# Patient Record
Sex: Male | Born: 1995 | Race: Black or African American | Hispanic: No | Marital: Single | Smoking: Never smoker
Health system: Southern US, Community
[De-identification: ages and names within clinical notes are randomized; demographics above are authoritative.]

## PROBLEM LIST (undated history)

## (undated) DIAGNOSIS — E119 Type 2 diabetes mellitus without complications: Secondary | ICD-10-CM

## (undated) DIAGNOSIS — Q249 Congenital malformation of heart, unspecified: Secondary | ICD-10-CM

## (undated) DIAGNOSIS — I10 Essential (primary) hypertension: Secondary | ICD-10-CM

## (undated) DIAGNOSIS — R569 Unspecified convulsions: Secondary | ICD-10-CM

## (undated) HISTORY — PX: OTHER SURGICAL HISTORY: SHX169

---

## 2017-04-24 DIAGNOSIS — Z113 Encounter for screening for infections with a predominantly sexual mode of transmission: Secondary | ICD-10-CM | POA: Diagnosis not present

## 2017-06-23 DIAGNOSIS — Z113 Encounter for screening for infections with a predominantly sexual mode of transmission: Secondary | ICD-10-CM | POA: Diagnosis not present

## 2018-01-02 ENCOUNTER — Emergency Department (HOSPITAL_COMMUNITY): Payer: BLUE CROSS/BLUE SHIELD

## 2018-01-02 ENCOUNTER — Inpatient Hospital Stay (HOSPITAL_COMMUNITY)
Admission: EM | Admit: 2018-01-02 | Discharge: 2018-01-09 | DRG: 064 | Disposition: A | Discharge status: Home Health | Payer: BLUE CROSS/BLUE SHIELD | Attending: Internal Medicine | Admitting: Internal Medicine

## 2018-01-02 ENCOUNTER — Other Ambulatory Visit: Payer: Self-pay

## 2018-01-02 ENCOUNTER — Encounter (HOSPITAL_COMMUNITY): Payer: Self-pay | Admitting: Emergency Medicine

## 2018-01-02 DIAGNOSIS — D72829 Elevated white blood cell count, unspecified: Secondary | ICD-10-CM

## 2018-01-02 DIAGNOSIS — I38 Endocarditis, valve unspecified: Secondary | ICD-10-CM

## 2018-01-02 DIAGNOSIS — E785 Hyperlipidemia, unspecified: Secondary | ICD-10-CM | POA: Diagnosis present

## 2018-01-02 DIAGNOSIS — R569 Unspecified convulsions: Secondary | ICD-10-CM | POA: Diagnosis not present

## 2018-01-02 DIAGNOSIS — I639 Cerebral infarction, unspecified: Secondary | ICD-10-CM | POA: Diagnosis present

## 2018-01-02 DIAGNOSIS — R29700 NIHSS score 0: Secondary | ICD-10-CM | POA: Diagnosis present

## 2018-01-02 DIAGNOSIS — I10 Essential (primary) hypertension: Secondary | ICD-10-CM | POA: Diagnosis present

## 2018-01-02 DIAGNOSIS — I6340 Cerebral infarction due to embolism of unspecified cerebral artery: Principal | ICD-10-CM | POA: Diagnosis present

## 2018-01-02 DIAGNOSIS — I76 Septic arterial embolism: Secondary | ICD-10-CM | POA: Diagnosis present

## 2018-01-02 DIAGNOSIS — I339 Acute and subacute endocarditis, unspecified: Secondary | ICD-10-CM | POA: Diagnosis present

## 2018-01-02 DIAGNOSIS — G40909 Epilepsy, unspecified, not intractable, without status epilepticus: Secondary | ICD-10-CM

## 2018-01-02 DIAGNOSIS — F1210 Cannabis abuse, uncomplicated: Secondary | ICD-10-CM | POA: Diagnosis present

## 2018-01-02 DIAGNOSIS — Q211 Atrial septal defect: Secondary | ICD-10-CM

## 2018-01-02 DIAGNOSIS — I513 Intracardiac thrombosis, not elsewhere classified: Secondary | ICD-10-CM | POA: Diagnosis present

## 2018-01-02 DIAGNOSIS — I240 Acute coronary thrombosis not resulting in myocardial infarction: Secondary | ICD-10-CM

## 2018-01-02 DIAGNOSIS — E10649 Type 1 diabetes mellitus with hypoglycemia without coma: Secondary | ICD-10-CM | POA: Diagnosis not present

## 2018-01-02 DIAGNOSIS — E1065 Type 1 diabetes mellitus with hyperglycemia: Secondary | ICD-10-CM | POA: Diagnosis present

## 2018-01-02 DIAGNOSIS — Q225 Ebstein's anomaly: Secondary | ICD-10-CM

## 2018-01-02 DIAGNOSIS — M3211 Endocarditis in systemic lupus erythematosus: Secondary | ICD-10-CM

## 2018-01-02 HISTORY — DX: Type 2 diabetes mellitus without complications: E11.9

## 2018-01-02 HISTORY — DX: Congenital malformation of heart, unspecified: Q24.9

## 2018-01-02 HISTORY — DX: Unspecified convulsions: R56.9

## 2018-01-02 LAB — BASIC METABOLIC PANEL
Anion gap: 13 (ref 5–15)
BUN: 10 mg/dL (ref 6–20)
CALCIUM: 9.4 mg/dL (ref 8.9–10.3)
CO2: 24 mmol/L (ref 22–32)
CREATININE: 1.16 mg/dL (ref 0.61–1.24)
Chloride: 102 mmol/L (ref 101–111)
GFR calc Af Amer: >60 mL/min (ref >60)
GFR calc non Af Amer: >60 mL/min (ref >60)
Glucose, Bld: 255 mg/dL — ABNORMAL HIGH (ref 65–99)
Potassium: 3.9 mmol/L (ref 3.5–5.1)
Sodium: 139 mmol/L (ref 135–145)

## 2018-01-02 LAB — CBC
HCT: 48.4 % (ref 39.0–52.0)
Hemoglobin: 17.3 g/dL — ABNORMAL HIGH (ref 13.0–17.0)
MCH: 29 pg (ref 26.0–34.0)
MCHC: 35.7 g/dL (ref 30.0–36.0)
MCV: 81.2 fL (ref 78.0–100.0)
PLATELETS: 265 10*3/uL (ref 150–400)
RBC: 5.96 MIL/uL — ABNORMAL HIGH (ref 4.22–5.81)
RDW: 13.4 % (ref 11.5–15.5)
WBC: 13.1 10*3/uL — ABNORMAL HIGH (ref 4.0–10.5)

## 2018-01-02 LAB — CBG MONITORING, ED: Glucose-Capillary: 215 mg/dL — ABNORMAL HIGH (ref 65–99)

## 2018-01-02 NOTE — ED Triage Notes (Signed)
Pt to ER for evaluation of "possible seizures" states woke up this morning with "extreme body pain, bite marks on my tongue, nausea, and a headache." states no known hx of seizures, not on seizure medications. Pt alert and oriented x4. NAD

## 2018-01-03 ENCOUNTER — Emergency Department (HOSPITAL_COMMUNITY): Payer: BLUE CROSS/BLUE SHIELD

## 2018-01-03 ENCOUNTER — Observation Stay (HOSPITAL_COMMUNITY): Payer: BLUE CROSS/BLUE SHIELD

## 2018-01-03 ENCOUNTER — Other Ambulatory Visit: Payer: Self-pay

## 2018-01-03 ENCOUNTER — Encounter (HOSPITAL_COMMUNITY): Payer: Self-pay | Admitting: Internal Medicine

## 2018-01-03 DIAGNOSIS — D72829 Elevated white blood cell count, unspecified: Secondary | ICD-10-CM | POA: Diagnosis present

## 2018-01-03 DIAGNOSIS — I6340 Cerebral infarction due to embolism of unspecified cerebral artery: Secondary | ICD-10-CM | POA: Diagnosis present

## 2018-01-03 DIAGNOSIS — Q225 Ebstein's anomaly: Secondary | ICD-10-CM

## 2018-01-03 DIAGNOSIS — E1065 Type 1 diabetes mellitus with hyperglycemia: Secondary | ICD-10-CM | POA: Diagnosis present

## 2018-01-03 DIAGNOSIS — E109 Type 1 diabetes mellitus without complications: Secondary | ICD-10-CM | POA: Diagnosis not present

## 2018-01-03 DIAGNOSIS — Z8774 Personal history of (corrected) congenital malformations of heart and circulatory system: Secondary | ICD-10-CM | POA: Diagnosis not present

## 2018-01-03 DIAGNOSIS — R569 Unspecified convulsions: Secondary | ICD-10-CM | POA: Diagnosis present

## 2018-01-03 DIAGNOSIS — E10649 Type 1 diabetes mellitus with hypoglycemia without coma: Secondary | ICD-10-CM | POA: Diagnosis not present

## 2018-01-03 DIAGNOSIS — I5189 Other ill-defined heart diseases: Secondary | ICD-10-CM | POA: Diagnosis not present

## 2018-01-03 DIAGNOSIS — E785 Hyperlipidemia, unspecified: Secondary | ICD-10-CM | POA: Diagnosis present

## 2018-01-03 DIAGNOSIS — I76 Septic arterial embolism: Secondary | ICD-10-CM | POA: Diagnosis present

## 2018-01-03 DIAGNOSIS — Z8669 Personal history of other diseases of the nervous system and sense organs: Secondary | ICD-10-CM | POA: Diagnosis not present

## 2018-01-03 DIAGNOSIS — R29700 NIHSS score 0: Secondary | ICD-10-CM | POA: Diagnosis present

## 2018-01-03 DIAGNOSIS — I6389 Other cerebral infarction: Secondary | ICD-10-CM | POA: Diagnosis not present

## 2018-01-03 DIAGNOSIS — I639 Cerebral infarction, unspecified: Secondary | ICD-10-CM | POA: Diagnosis present

## 2018-01-03 DIAGNOSIS — I236 Thrombosis of atrium, auricular appendage, and ventricle as current complications following acute myocardial infarction: Secondary | ICD-10-CM | POA: Diagnosis not present

## 2018-01-03 DIAGNOSIS — I361 Nonrheumatic tricuspid (valve) insufficiency: Secondary | ICD-10-CM | POA: Diagnosis not present

## 2018-01-03 DIAGNOSIS — G40909 Epilepsy, unspecified, not intractable, without status epilepticus: Secondary | ICD-10-CM

## 2018-01-03 DIAGNOSIS — I10 Essential (primary) hypertension: Secondary | ICD-10-CM | POA: Diagnosis present

## 2018-01-03 DIAGNOSIS — I339 Acute and subacute endocarditis, unspecified: Secondary | ICD-10-CM | POA: Diagnosis present

## 2018-01-03 DIAGNOSIS — E7211 Homocystinuria: Secondary | ICD-10-CM | POA: Diagnosis not present

## 2018-01-03 DIAGNOSIS — Z952 Presence of prosthetic heart valve: Secondary | ICD-10-CM | POA: Diagnosis not present

## 2018-01-03 DIAGNOSIS — I513 Intracardiac thrombosis, not elsewhere classified: Secondary | ICD-10-CM | POA: Diagnosis present

## 2018-01-03 DIAGNOSIS — Q211 Atrial septal defect: Secondary | ICD-10-CM | POA: Diagnosis not present

## 2018-01-03 DIAGNOSIS — F1210 Cannabis abuse, uncomplicated: Secondary | ICD-10-CM | POA: Diagnosis present

## 2018-01-03 DIAGNOSIS — I330 Acute and subacute infective endocarditis: Secondary | ICD-10-CM | POA: Diagnosis not present

## 2018-01-03 LAB — RAPID URINE DRUG SCREEN, HOSP PERFORMED
Amphetamines: NOT DETECTED
Barbiturates: NOT DETECTED
Benzodiazepines: NOT DETECTED
Cocaine: NOT DETECTED
OPIATES: NOT DETECTED
Tetrahydrocannabinol: NOT DETECTED

## 2018-01-03 LAB — COMPREHENSIVE METABOLIC PANEL
ALT: 39 U/L (ref 17–63)
AST: 72 U/L — AB (ref 15–41)
Albumin: 3.1 g/dL — ABNORMAL LOW (ref 3.5–5.0)
Alkaline Phosphatase: 88 U/L (ref 38–126)
Anion gap: 12 (ref 5–15)
BILIRUBIN TOTAL: 1.2 mg/dL (ref 0.3–1.2)
BUN: 11 mg/dL (ref 6–20)
CO2: 17 mmol/L — ABNORMAL LOW (ref 22–32)
Calcium: 8.4 mg/dL — ABNORMAL LOW (ref 8.9–10.3)
Chloride: 101 mmol/L (ref 101–111)
Creatinine, Ser: 1.3 mg/dL — ABNORMAL HIGH (ref 0.61–1.24)
Glucose, Bld: 522 mg/dL (ref 65–99)
POTASSIUM: 4.3 mmol/L (ref 3.5–5.1)
Sodium: 130 mmol/L — ABNORMAL LOW (ref 135–145)
TOTAL PROTEIN: 5.5 g/dL — AB (ref 6.5–8.1)

## 2018-01-03 LAB — SJOGRENS SYNDROME-A EXTRACTABLE NUCLEAR ANTIBODY

## 2018-01-03 LAB — GLUCOSE, CAPILLARY
GLUCOSE-CAPILLARY: 184 mg/dL — AB (ref 65–99)
Glucose-Capillary: 233 mg/dL — ABNORMAL HIGH (ref 65–99)

## 2018-01-03 LAB — C-REACTIVE PROTEIN: CRP: 1.4 mg/dL — AB (ref <1.0)

## 2018-01-03 LAB — HEMOGLOBIN A1C
HEMOGLOBIN A1C: 12.2 % — AB (ref 4.8–5.6)
MEAN PLASMA GLUCOSE: 303.44 mg/dL

## 2018-01-03 LAB — CBC
HEMATOCRIT: 41.9 % (ref 39.0–52.0)
Hemoglobin: 14.4 g/dL (ref 13.0–17.0)
MCH: 27.6 pg (ref 26.0–34.0)
MCHC: 34.4 g/dL (ref 30.0–36.0)
MCV: 80.4 fL (ref 78.0–100.0)
Platelets: 269 10*3/uL (ref 150–400)
RBC: 5.21 MIL/uL (ref 4.22–5.81)
RDW: 13.4 % (ref 11.5–15.5)
WBC: 8 10*3/uL (ref 4.0–10.5)

## 2018-01-03 LAB — LIPID PANEL
CHOLESTEROL: 137 mg/dL (ref 0–200)
HDL: 49 mg/dL (ref >40)
LDL Cholesterol: 69 mg/dL (ref 0–99)
TRIGLYCERIDES: 97 mg/dL (ref <150)
Total CHOL/HDL Ratio: 2.8 RATIO
VLDL: 19 mg/dL (ref 0–40)

## 2018-01-03 LAB — SJOGRENS SYNDROME-B EXTRACTABLE NUCLEAR ANTIBODY

## 2018-01-03 LAB — ANTITHROMBIN III: ANTITHROMB III FUNC: 112 % (ref 75–120)

## 2018-01-03 LAB — CBG MONITORING, ED
GLUCOSE-CAPILLARY: 497 mg/dL — AB (ref 65–99)
GLUCOSE-CAPILLARY: 147 mg/dL — AB (ref 65–99)
Glucose-Capillary: 116 mg/dL — ABNORMAL HIGH (ref 65–99)

## 2018-01-03 LAB — SEDIMENTATION RATE: SED RATE: 1 mm/h (ref 0–16)

## 2018-01-03 LAB — HIV ANTIBODY (ROUTINE TESTING W REFLEX): HIV Screen 4th Generation wRfx: NONREACTIVE

## 2018-01-03 LAB — URINALYSIS, ROUTINE W REFLEX MICROSCOPIC
BACTERIA UA: NONE SEEN
BILIRUBIN URINE: NEGATIVE
Glucose, UA: >=500 mg/dL — AB
HGB URINE DIPSTICK: NEGATIVE
KETONES UR: NEGATIVE mg/dL
LEUKOCYTES UA: NEGATIVE
Nitrite: NEGATIVE
PH: 7 (ref 5.0–8.0)
Protein, ur: NEGATIVE mg/dL
Specific Gravity, Urine: 1.021 (ref 1.005–1.030)
Squamous Epithelial / LPF: NONE SEEN

## 2018-01-03 MED ORDER — ACETAMINOPHEN 650 MG RE SUPP: 650.0000 mg | RECTAL | Status: DC | PRN

## 2018-01-03 MED ORDER — ATORVASTATIN CALCIUM 80 MG PO TABS
80.0000 mg | ORAL_TABLET | Freq: Every day | ORAL | Status: DC
  Administered 2018-01-03 – 2018-01-04 (×2): 80 mg via ORAL
  Filled 2018-01-03 (×2): qty 1

## 2018-01-03 MED ORDER — SODIUM CHLORIDE 0.9 % IV SOLN
INTRAVENOUS | Status: AC
  Administered 2018-01-03 – 2018-01-04 (×2): via INTRAVENOUS

## 2018-01-03 MED ORDER — INSULIN GLARGINE 100 UNIT/ML ~~LOC~~ SOLN
30.0000 [IU] | Freq: Once | SUBCUTANEOUS | Status: AC
  Administered 2018-01-03: 30 [IU] via SUBCUTANEOUS
  Filled 2018-01-03: qty 0.3

## 2018-01-03 MED ORDER — INSULIN GLARGINE 100 UNIT/ML ~~LOC~~ SOLN
30.0000 [IU] | Freq: Every day | SUBCUTANEOUS | Status: DC
  Administered 2018-01-03 – 2018-01-08 (×6): 30 [IU] via SUBCUTANEOUS
  Filled 2018-01-03 (×7): qty 0.3

## 2018-01-03 MED ORDER — ACETAMINOPHEN 325 MG PO TABS: 650.0000 mg | ORAL_TABLET | ORAL | Status: DC | PRN

## 2018-01-03 MED ORDER — SODIUM CHLORIDE 0.9 % IV SOLN
1000.0000 mg | Freq: Once | INTRAVENOUS | Status: AC
  Administered 2018-01-03: 1000 mg via INTRAVENOUS
  Filled 2018-01-03: qty 10

## 2018-01-03 MED ORDER — ASPIRIN 300 MG RE SUPP: 300.0000 mg | Freq: Every day | RECTAL | Status: DC

## 2018-01-03 MED ORDER — IOPAMIDOL (ISOVUE-370) INJECTION 76%
INTRAVENOUS | Status: AC
  Administered 2018-01-03: 50 mL
  Filled 2018-01-03: qty 50

## 2018-01-03 MED ORDER — STROKE: EARLY STAGES OF RECOVERY BOOK
Freq: Once | Status: AC
  Administered 2018-01-05: 22:00:00
  Filled 2018-01-03: qty 1

## 2018-01-03 MED ORDER — LEVETIRACETAM 500 MG PO TABS
500.0000 mg | ORAL_TABLET | Freq: Two times a day (BID) | ORAL | Status: DC
  Administered 2018-01-03 – 2018-01-09 (×13): 500 mg via ORAL
  Filled 2018-01-03 (×13): qty 1

## 2018-01-03 MED ORDER — GADOBENATE DIMEGLUMINE 529 MG/ML IV SOLN
11.0000 mL | Freq: Once | INTRAVENOUS | Status: AC | PRN
  Administered 2018-01-03: 11 mL via INTRAVENOUS

## 2018-01-03 MED ORDER — ACETAMINOPHEN 160 MG/5ML PO SOLN: 650.0000 mg | ORAL | Status: DC | PRN

## 2018-01-03 MED ORDER — INSULIN ASPART 100 UNIT/ML ~~LOC~~ SOLN
0.0000 [IU] | Freq: Three times a day (TID) | SUBCUTANEOUS | Status: DC
  Administered 2018-01-03: 2 [IU] via SUBCUTANEOUS
  Administered 2018-01-03: 9 [IU] via SUBCUTANEOUS
  Administered 2018-01-04 (×2): 2 [IU] via SUBCUTANEOUS
  Administered 2018-01-04: 5 [IU] via SUBCUTANEOUS
  Administered 2018-01-05 (×2): 3 [IU] via SUBCUTANEOUS
  Administered 2018-01-05: 5 [IU] via SUBCUTANEOUS
  Administered 2018-01-06: 2 [IU] via SUBCUTANEOUS
  Administered 2018-01-06: 9 [IU] via SUBCUTANEOUS
  Administered 2018-01-07: 7 [IU] via SUBCUTANEOUS
  Administered 2018-01-07: 2 [IU] via SUBCUTANEOUS
  Administered 2018-01-08 – 2018-01-09 (×3): 3 [IU] via SUBCUTANEOUS
  Filled 2018-01-03: qty 1

## 2018-01-03 MED ORDER — ENOXAPARIN SODIUM 40 MG/0.4ML ~~LOC~~ SOLN
40.0000 mg | SUBCUTANEOUS | Status: DC
  Administered 2018-01-04 – 2018-01-05 (×2): 40 mg via SUBCUTANEOUS
  Filled 2018-01-03 (×3): qty 0.4

## 2018-01-03 MED ORDER — KETOROLAC TROMETHAMINE 30 MG/ML IJ SOLN
30.0000 mg | Freq: Once | INTRAMUSCULAR | Status: AC
  Administered 2018-01-03: 30 mg via INTRAVENOUS
  Filled 2018-01-03: qty 1

## 2018-01-03 MED ORDER — ASPIRIN 325 MG PO TABS
325.0000 mg | ORAL_TABLET | Freq: Every day | ORAL | Status: DC
  Administered 2018-01-03 – 2018-01-05 (×3): 325 mg via ORAL
  Filled 2018-01-03 (×3): qty 1

## 2018-01-03 NOTE — Progress Notes (Signed)
STROKE TEAM PROGRESS NOTE   HISTORY OF PRESENT ILLNESS (per record) Parrish Pasqual is a 22 y.o. 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  He said that he woke up early yesterday morning with his body feeling sore all over.  Upon waking up he also found bite marks on his tongue on both sides.  He sought help at the [facility] from where he was referred to the emergency room. He said that he probably had seizures as a child in middle school and was on medication for it up until the end of ninth grade.  He has not had any seizures since then.  He is unable to tell me what antiepileptic he was on at the time. He had been seen by a neurologist in [place] and EEG and MRIs have been done in the past with no results available for review. Currently, he denies any headaches, focal tingling numbness or weakness.  Denies any headaches.  Denies nausea vomiting. Does not report any preceding illnesses, fevers or chills prior to presentation. Denies any chest pain shortness of breath or palpitations. He denies any illicit drug use other than using marijuana.  Does not use tobacco or alcohol.  Denies any family history of strokes in young.  Denies family history of seizures. He does not know of any family member with a history of sickle cell.   LKW: 8 AM on 01/02/2018 tpa given?: no, outside the window Premorbid modified Rankin scale (mRS): 0     SUBJECTIVE (INTERVAL HISTORY) No family present. Pt very sleepy  - lethargic. He agrees to undergo further testing. 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 No recent cardiovascular complaints Denies recent surgery, fever, dental problems.    OBJECTIVE Temp:  [98 F (36.7 C)-99 F (37.2 C)]  98.7 F (37.1 C) (02/09 1522) Pulse Rate:  [70-92] 85 (02/09 1522) Resp:  [13-26] 18 (02/09 1522) BP: (104-140)/(62-87) 110/70 (02/09 1522) SpO2:  [93 %-99 %] 99 % (02/09 1522) Weight:  [123 lb (55.8 kg)] 123 lb (55.8 kg) (02/09 0443)  CBC:  Recent Labs  Lab 01/02/18 1828 01/03/18 0621  WBC 13.1* 8.0  HGB 17.3* 14.4  HCT 48.4 41.9  MCV 81.2 80.4  PLT 265 269    Basic Metabolic Panel:  Recent Labs  Lab 01/02/18 1828 01/03/18 0621  NA 139 130*  K 3.9 4.3  CL 102 101  CO2 24 17*  GLUCOSE 255* 522*  BUN 10 11  CREATININE 1.16 1.30*  CALCIUM 9.4 8.4*    Lipid Panel:     Component Value Date/Time   CHOL 137 01/03/2018 0623   TRIG 97 01/03/2018 0623   HDL 49 01/03/2018 0623   CHOLHDL 2.8 01/03/2018 0623   VLDL 19 01/03/2018 0623   LDLCALC 69 01/03/2018 0623   HgbA1c:  Lab Results  Component Value Date   HGBA1C 12.2 (H) 01/03/2018   Urine Drug Screen:     Component Value Date/Time   LABOPIA NONE DETECTED 01/03/2018 0116   COCAINSCRNUR NONE DETECTED 01/03/2018 0116   LABBENZ NONE DETECTED 01/03/2018 0116   AMPHETMU NONE DETECTED 01/03/2018 0116   THCU NONE DETECTED 01/03/2018 0116   LABBARB NONE DETECTED 01/03/2018 0116    Alcohol Level No results found for:  ETH  IMAGING   Ct Angio Head W Or Wo Contrast Ct Angio Neck W Or Wo Contrast 01/03/2018 IMPRESSION:  1. Negative CTA of the head and neck. No occlusion, stenosis, or embolic source identified.  2. Partially visualized postoperative heart. History of congenital heart disease.    Dg Chest 1 View 01/03/2018 IMPRESSION:  Cardiomegaly.  No active disease.      Ct Head Wo Contrast 01/02/2018 IMPRESSION:  1. 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 There is no definite communication with the lateral ventricle. MRI of the head without and with contrast could be used for further evaluation.  2. No acute intracranial abnormality or focal abnormality to  explain seizures otherwise.     Mr Laqueta JeanBrain W And Wo Contrast 01/03/2018 IMPRESSION:  1. Small foci of reduced diffusion are present within the left frontal white matter, left putamen, bilateral occipital lobes, and right cerebellum probably representing acute/early subacute infarctions. The pattern is atypical for seizure activity. Additionally, there are several punctate sequelae of chronic microhemorrhage throughout the brain and chronic infarcts in the right cerebellum. Given patient age, embolic phenomenon possibly with cardiac defect, sequelae of vasculitis, traumatic brain injury, or hematologic disorder should be considered.  2. 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  3. No additional structural cause of seizure identified.     Transthoracic Echocardiogram - pending 00/00/00  Bilateral Lower Extremity Dopplers - pending   Transcranial Dopplers - pending   PHYSICAL EXAM Vitals:   01/03/18 1400 01/03/18 1430 01/03/18 1445 01/03/18 1522  BP: 108/63 112/76 110/77 110/70  Pulse: 79 83 81 85  Resp: 19 16 20 18   Temp:    98.7 F (37.1 C)  TempSrc:    Oral  SpO2: 97% 99% 97% 99%  Weight:        Pleasant young african [place] male not in distress. . Afebrile. Head is nontraumatic. Neck is supple without bruit.    Cardiac exam no murmur or gallop. Lungs are clear to auscultation. Distal pulses are well felt.midline chest surgical scar. Bite marks on lateral aspect of tongue Neurological Exam ;  Awake  Alert oriented x 3. Normal speech and language.eye movements full without nystagmus.fundi were not visualized. Vision acuity and fields appear normal. Hearing is normal. Palatal movements are normal. Face symmetric. Tongue midline. Normal strength, tone, reflexes and coordination. Normal sensation. Gait deferred.      HOME MEDICATIONS:  Medications Prior to Admission  Medication Sig Dispense Refill  . insulin glargine  (LANTUS) 100 UNIT/ML injection Inject 30 Units into the skin at bedtime.    . insulin lispro (HUMALOG) 100 UNIT/ML injection Inject 10-30 Units into the skin 3 (three) times daily before meals. Sliding scale        HOSPITAL MEDICATIONS:  .  stroke: mapping our early stages of recovery book   Does not apply Once  . aspirin  300 mg Rectal Daily   Or  . aspirin  325 mg Oral Daily  . atorvastatin  80 mg Oral q1800  . enoxaparin (LOVENOX) injection  40 mg Subcutaneous Q24H  . insulin aspart  0-9 Units Subcutaneous TID WC  . insulin glargine  30 Units Subcutaneous QHS  . levETIRAcetam  500 mg Oral BID    ALLERGIES No Known Allergies  PAST MEDICAL HISTORY Past Medical History:  Diagnosis Date  . Congenital heart disease   . Diabetes mellitus without complication (HCC)   . Seizures (  HCC)     SURGICAL HISTORY Past Surgical History:  Procedure Laterality Date  . open heart surgery      FAMILY HISTORY Family History  Problem Relation Age of Onset  . Diabetes Mellitus I Sister      ASSESSMENT/PLAN Mr. Manuel Baker is a 22 y.o. male with history of Epsteins anomaly S/P surgical repair age 79, chronic headaches, seizure history, and diabetes, presenting with possible seizure activity. He did not receive IV t-PA due to late presentation.  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  MRI head - Small foci of reduced diffusion in the left frontal white matter, left putamen, bilateral occipital lobes, and right cerebellum probably representing acute/early subacute infarctions.  MRA head - not performed  EEG - pending   CTA H&N - negative  Transcranial Dopplers - pending  LE Dopplers - pending  Carotid Doppler - CTA neck  2D Echo - pending  LDL - 69  HgbA1c - 12.2  VTE prophylaxis - Lovenox Seizure precautions Diet heart  healthy/carb modified Room service appropriate? Yes; Fluid consistency: Thin Fall precautions  No antithrombotic prior to admission, now on aspirin 325 mg daily  Patient counseled to be compliant with his antithrombotic medications  Ongoing aggressive stroke risk factor management  Therapy recommendations:  pending  Disposition:  Pending  Hypertension  Blood pressure runs mildly low  Permissive hypertension (OK if < 220/120) but gradually normalize in 5-7 days  Long-term BP goal normotensive  Hyperlipidemia  Home meds:  No lipid lowering medications prior to admission  LDL 69, goal < 70  Now on Lipitor 80 mg daily  Continue statin at discharge   Diabetes  HgbA1c 12.2, goal < 7.0  Uncontrolled  Other Stroke Risk Factors  ETOH use, advised to drink no more than 1 drink per day.  Congenital heart defect   Other Active Problems  Epsteins anomaly - cardiology consult -> TEE with bubble study and cardiac MRI recommended.  Seizure history - now on Keppra - EEG pending  Na - 130  Creatinine - 1.30   Plan / Recommendations   Stroke workup   Hospital day # 0  Delton See PA-C [facility] Pager [phone] 01/03/2018, 4:10 PM I have personally examined this patient, reviewed notes, independently viewed imaging studies, participated in medical decision making and plan of care.ROS completed by me personally and pertinent positives fully documented  I have made any additions or clarifications directly to the above note. Agree with note above.  He presented with a generalized seizure and MRI shows multiple tiny embolic infarcts etiology likely embolic and given his remote history of congenital heart disease and surgery at age 32 possibilities include right-to-left intracardiac shunt versus endocarditis.  Recommend lower extremity venous Dopplers and transesophageal echocardiogram with bubble study.  Cardiology consult.  Aspirin for now.  Continue  Keppra for seizures.  Long discussion with the patient and Dr. Caleb Popp and answered questions.  Greater than 50% time during this 35-minute visit was spent on counseling and coordination of care about his seizures and strokes and congenital heart disease and answering questions.  Delia Heady, MD Medical Director [facility] Pager: [phone] 01/03/2018 4:17 PM  To contact Stroke Continuity provider, please refer to [email or web address]. After hours, contact General Neurology

## 2018-01-03 NOTE — Progress Notes (Signed)
Patient being transferred from ED. Will follow-up for comprehensive PT evaluation once he has settled in on next unit.

## 2018-01-03 NOTE — ED Notes (Signed)
Patient transported to MRI 

## 2018-01-03 NOTE — ED Notes (Signed)
Ordered lunch tray via email, heart healthy/carb modified 

## 2018-01-03 NOTE — ED Notes (Signed)
Ordered Breakfast Tray  

## 2018-01-03 NOTE — H&P (Signed)
History and Physical    Suanne MarkerCedric Mudgett ZOX:[id number] DOB: 10/28/1996 DOA: 01/02/2018  PCP: System, Pcp Not In  Patient coming from: Home.  Chief Complaint: Tongue bite.  HPI: Sharyl NimrodCedric Maisie [name] is a 22 y.o. 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  [facility].  Patient otherwise denies any focal deficits visual symptoms difficulty speaking or swallowing.  Has been having generalized body aches and headache.  ED Course: In the ER patient appears to be nonfocal.  MRI of the brain shows features concerning for multifocal stroke and neurology was consulted.  Patient was loaded on Keppra for seizures.  Since patient past stroke swallow.  Patient admitted for further workup of stroke and seizures.  Review of Systems: As per HPI, rest all negative.   Past Medical History:  Diagnosis Date  . Congenital heart disease   . Diabetes mellitus without complication (HCC)   . Seizures (HCC)     Past Surgical History:  Procedure Laterality Date  . open heart surgery       reports that  has never smoked. he has never used smokeless tobacco. He reports that he drinks alcohol. He reports that he uses drugs. Drug: Marijuana.  No Known Allergies  Family History  Problem Relation Age of Onset  . Diabetes Mellitus I Sister     Prior to Admission medications   Medication Sig Start Date End Date Taking? Authorizing Provider  insulin glargine (LANTUS) 100 UNIT/ML injection Inject 30 Units into the skin at bedtime.   Yes [provider]  insulin lispro (HUMALOG) 100 UNIT/ML injection Inject 10-30 Units into the skin 3 (three) times daily before meals. Sliding scale   Yes [provider]    Physical Exam: Vitals:   01/02/18 1821 01/02/18 2217 01/03/18 0443  BP: 140/82 132/82   Pulse: 92 89   Resp: 16 17   Temp: 99 F (37.2 C) 98.3 F (36.8 C)   TempSrc: Oral    SpO2: 95% 93%   Weight:   55.8 kg (123 lb)      Constitutional: Moderately built and nourished. Vitals:   01/02/18 1821 01/02/18 2217 01/03/18 0443  BP: 140/82 132/82   Pulse: 92 89   Resp: 16 17   Temp: 99 F (37.2 C) 98.3 F (36.8 C)   TempSrc: Oral    SpO2: 95% 93%   Weight:   55.8 kg (123 lb)   Eyes: Anicteric no pallor. ENMT: No discharge from the ears eyes nose or mouth. Neck: No mass felt.  No JVD appreciated. Respiratory: No rhonchi or crepitations. Cardiovascular: S1-S2 heard no murmurs appreciated. Abdomen: Soft nontender bowel sounds present. Musculoskeletal: No edema no joint effusion. Skin: No rash. Neurologic: Alert awake oriented to time place and person.  Moves all extremities 5 x 5.  No facial asymmetry tongue is midline.  Pupils are equal and reactive to light. Psychiatric: Appears normal.  Normal affect.   Labs on Admission: I have personally reviewed following labs and imaging studies  CBC: Recent Labs  Lab 01/02/18 1828  WBC 13.1*  HGB 17.3*  HCT 48.4  MCV 81.2  PLT 265   Basic Metabolic Panel: Recent Labs  Lab 01/02/18 1828  NA 139  K  3.9  CL 102  CO2 24  GLUCOSE 255*  BUN 10  CREATININE 1.16  CALCIUM 9.4   GFR: CrCl cannot be calculated (Unknown ideal weight.). Liver Function Tests: No results for input(s): AST, ALT, ALKPHOS, BILITOT, PROT, ALBUMIN in the last 168 hours. No results for input(s): LIPASE, AMYLASE in the last 168 hours. No results for input(s): AMMONIA in the last 168 hours. Coagulation Profile: No results for input(s): INR, PROTIME in the last 168 hours. Cardiac Enzymes: No results for input(s): CKTOTAL, CKMB, CKMBINDEX, TROPONINI in the last 168 hours. BNP (last 3 results) No results for input(s): PROBNP in the last 8760 hours. HbA1C: No results for  input(s): HGBA1C in the last 72 hours. CBG: Recent Labs  Lab 01/02/18 1832  GLUCAP 215*   Lipid Profile: No results for input(s): CHOL, HDL, LDLCALC, TRIG, CHOLHDL, LDLDIRECT in the last 72 hours. Thyroid Function Tests: No results for input(s): TSH, T4TOTAL, FREET4, T3FREE, THYROIDAB in the last 72 hours. Anemia Panel: No results for input(s): VITAMINB12, FOLATE, FERRITIN, TIBC, IRON, RETICCTPCT in the last 72 hours. Urine analysis:    Component Value Date/Time   COLORURINE COLORLESS (A) 01/03/2018 0116   APPEARANCEUR CLEAR 01/03/2018 0116   LABSPEC 1.021 01/03/2018 0116   PHURINE 7.0 01/03/2018 0116   GLUCOSEU >=500 (A) 01/03/2018 0116   HGBUR NEGATIVE 01/03/2018 0116   BILIRUBINUR NEGATIVE 01/03/2018 0116   KETONESUR NEGATIVE 01/03/2018 0116   PROTEINUR NEGATIVE 01/03/2018 0116   NITRITE NEGATIVE 01/03/2018 0116   LEUKOCYTESUR NEGATIVE 01/03/2018 0116   Sepsis Labs: @LABRCNTIP (procalcitonin:4,lacticidven:4) )No results found for this or any previous visit (from the past 240 hour(s)).   Radiological Exams on Admission: Ct Head Wo Contrast  Result Date: 01/02/2018 CLINICAL DATA:  Seizure, new, nontraumatic, 18-40 years. EXAM: CT HEAD WITHOUT CONTRAST TECHNIQUE: Contiguous axial images were obtained from the base of the skull through the vertex without intravenous contrast. COMPARISON:  None. FINDINGS: Brain: A benign-appearing cyst adjacent to the posterior horn of the left lateral ventricle measures 2.4 x 2.2 x 2.2 cm. There is focal dilation of the posterior horn of left lateral ventricle as well. No acute infarct, hemorrhage, or mass lesion is present. No migrational anomaly is evident. Temporal lobes are unremarkable. Remote lacunar infarcts are present posteriorly in the right cerebellum. Brainstem and cerebellum are otherwise normal. Vascular: No hyperdense vessel or unexpected calcification. Skull: Calvarium is intact. No focal lytic or blastic lesions are present. No  significant extracranial soft tissue injury is present. Sinuses/Orbits: The paranasal sinuses and mastoid air cells are clear. Globes and orbits are within normal limits. Other: None. IMPRESSION: 1. 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 There is no definite communication with the lateral ventricle. MRI of the head without and with contrast could be used for further evaluation. 2. No acute intracranial abnormality or focal abnormality to explain seizures otherwise. Electronically Signed   By: Marin Roberts M.D.   On: 01/02/2018 19:38   Mr Laqueta Jean And Wo Contrast  Result Date: 01/03/2018 CLINICAL DATA:  22 y/o M; Seizure, new, nontraumatic, 18-40 yrs. History of seizures and prior seizure therapy. EXAM: MRI HEAD WITHOUT AND WITH CONTRAST TECHNIQUE: Multiplanar, multiecho pulse sequences of the brain and surrounding structures were obtained without and with intravenous contrast. CONTRAST:  11mL MULTIHANCE GADOBENATE DIMEGLUMINE 529 MG/ML IV SOLN COMPARISON:  01/02/2018 CT head FINDINGS: Brain: Punctate foci of reduced diffusion are present within the bilateral occipital lobes, right cerebellar hemisphere, and there are subcentimeter  foci of infarction within the left frontal centrum semiovale and superior left putamen. No associated hemorrhage or mass effect. Faint T2 FLAIR signal abnormality is present associated with the largest left frontal centrum semiovale lesion. Small chronic infarctions in the right cerebellar hemisphere. There several punctate foci of susceptibility hypointensity scattered throughout the supratentorial brain and the left cerebellar hemisphere compatible with hemosiderin deposition of chronic microhemorrhage. 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 5 mm  T1 hyperintense focus within the right paramedian sella poorly visualized on the gradient T1 sequence, possibly small lipoma or proteinaceous cyst. Normal size pituitary gland with midline infundibulum. Complete corpus callosum and vermis. Hippocampi are symmetric in size and signal. No disorder cortical formation, cortical dysplasia, or heterotopia identified. Vascular: Normal flow voids. Skull and upper cervical spine: Normal marrow signal. Sinuses/Orbits: Negative. Other: None. IMPRESSION: 1. Small foci of reduced diffusion are present within the left frontal white matter, left putamen, bilateral occipital lobes, and right cerebellum probably representing acute/early subacute infarctions. The pattern is atypical for seizure activity. Additionally, there are several punctate sequelae of chronic microhemorrhage throughout the brain and chronic infarcts in the right cerebellum. Given patient age, embolic phenomenon possibly with cardiac defect, sequelae of vasculitis, traumatic brain injury, or hematologic disorder should be considered. 2. 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 3. No additional structural cause of seizure identified. These results were called by telephone at the time of interpretation on 01/03/2018 at 4:34 am to Dr. Rochele Raring , who verbally acknowledged these results. Electronically Signed   By: Mitzi Hansen M.D.   On: 01/03/2018 04:47     Assessment/Plan Principal Problem:   Acute cardioembolic stroke [facility]) Active Problems:   Diabetes mellitus type 1, controlled, insulin dependent (HCC)   Seizure (HCC)   Stroke (cerebrum) (HCC)    1. Acute ischemic stroke -appreciate neurology consult likely to be cardioembolic.  EKG is pending.  Ordered 2D echo CT angiogram of the head and neck and hypercoagulable workup has been ordered.  Patient is on aspirin and atorvastatin.  Patient passed swallow and has been placed on diet.   Check hemoglobin A1c and lipid panel. 2. Seizure patient was loaded on Keppra and placed on Keppra 500 mg twice daily.  EEG pending. 3. Diabetes mellitus type 1 -on Lantus insulin 30 units at bedtime along with sliding scale coverage.  Check hemoglobin A1c. 4. History of Epstein anomaly status post repair 10 years ago. 5. Marijuana abuse.  Chest x-ray and EKG are pending.   DVT prophylaxis: Lovenox. Code Status: Full code. Family Communication: Discussed with patient. Disposition Plan: Home. Consults called: Neurology. Admission status: Observation.   Eduard Clos MD [facility] Pager [phone].  If 7PM-7AM, please contact night-coverage [email or web address] Password [facility]  01/03/2018, 6:23 AM

## 2018-01-03 NOTE — ED Notes (Signed)
Pt CBG was 497, notified Ruth(RN)

## 2018-01-03 NOTE — ED Provider Notes (Signed)
TIME SEEN: 12:19 AM  CHIEF COMPLAINT: Possible seizure  HPI: Patient is a 22 year old male with history of insulin-dependent diabetes with previous history of possible seizures who presents emergency department today with a possible seizure.  States that he woke up yesterday morning and had a headache, felt sore all over and noticed that he had bite marks to his tongue.  [facility].  Reports he has had "episodes" when he was in middle school.  States that his grandmother witnessed what sounds like generalized tonic-clonic seizures several times.  He was previously on antiepileptics but he cannot recall the name.  He has been off for several years.  Was previously being seen by a neurologist as states he had an EEG that was unremarkable as well as an MRI of his brain but does not recall the results of the MRI.  He denies any numbness, tingling or focal weakness.  Denies any recent fevers, cough, vomiting or diarrhea.  States before yesterday he was feeling fine.  No recent head trauma.  ROS: See HPI Constitutional: no fever  Eyes: no drainage  ENT: no runny nose   Cardiovascular:  no chest pain  Resp: no SOB  GI: no vomiting GU: no dysuria Integumentary: no rash  Allergy: no hives  Musculoskeletal: no leg swelling  Neurological: no slurred speech ROS otherwise negative  PAST MEDICAL HISTORY/PAST SURGICAL HISTORY:  History reviewed. No pertinent past medical history.  MEDICATIONS:  Prior to Admission medications   Not on File    ALLERGIES:  Not on File  SOCIAL HISTORY:  Social History   Tobacco Use  . Smoking status: Never Smoker  . Smokeless tobacco: Never Used  Substance Use Topics  . Alcohol use: Yes    Frequency: Never    Comment: social    FAMILY HISTORY: History reviewed. No pertinent family history.  EXAM: BP 132/82   Pulse 89   Temp 98.3 F (36.8 C)   Resp 17   SpO2 93%  CONSTITUTIONAL: Alert  and oriented and responds appropriately to questions. Well-appearing; well-nourished; GCS 15 HEAD: Normocephalic; atraumatic EYES: Conjunctivae clear, PERRL, EOMI ENT: normal nose; no rhinorrhea; moist mucous membranes; pharynx without lesions noted; no dental injury; no septal hematoma, normal speech with normal phonation, no trismus or drooling, no stridor, superficial bite marks to the tongue bilaterally without laceration NECK: Supple, no meningismus, no LAD; no midline spinal tenderness, step-off or deformity; trachea midline CARD: RRR; S1 and S2 appreciated; no murmurs, no clicks, no rubs, no gallops RESP: Normal chest excursion without splinting or tachypnea; breath sounds clear and equal bilaterally; no wheezes, no rhonchi, no rales; no hypoxia or respiratory distress CHEST:  chest wall stable, no crepitus or ecchymosis or deformity, nontender to palpation; no flail chest ABD/GI: Normal bowel sounds; non-distended; soft, non-tender, no rebound, no guarding; no ecchymosis or other lesions noted PELVIS:  stable, nontender to palpation BACK:  The back appears normal and is non-tender to palpation, there is no CVA tenderness; no midline spinal tenderness, step-off or deformity EXT: Normal ROM in all joints; non-tender to palpation; no edema; normal capillary refill; no cyanosis, no bony tenderness or bony deformity of patient's extremities, no joint effusion, compartments are soft, extremities are warm and well-perfused, no ecchymosis SKIN: Normal color for age and race; warm NEURO: Moves all extremities equally, strength 5/5 in all 4 extremities, sensation to light touch intact diffusely, cranial nerves II through XII intact, normal speech PSYCH:  The patient's mood and manner are appropriate. Grooming and personal hygiene are appropriate.  MEDICAL DECISION MAKING: Patient here with what sounds like possible seizure.  He does have bite marks to his tongue bilaterally.  Has had what sounds like  seizures in the past.  States that he does not live with anybody and this episode today was not witnessed.  His labs show mild leukocytosis without any infectious symptoms.  Likely reactive.  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  Anticipate the patient will need to be started on antiepileptics.  He does report that he has insurance.  We will give him Toradol for his generalized achiness.  He is also requesting his nighttime Lantus.  He is on Humalog sliding scale insulin daily and Lantus 30 units at night.  His blood glucose is 215.  ED PROGRESS:    12:25 AM  D/w Dr. Wilford Corner with neurology.  Appreciate his help.  He has reviewed patient's head CT and would recommend MRI of the brain with and without contrast from the emergency room.  We will keep the patient n.p.o.  Will load with 1000 mg of IV Keppra.  If MRI shows no acute surgical abnormality patient will be discharged with outpatient neurology follow-up and on Keppra 500 mg twice daily.    4:40 AM Urine shows no sign of infection or dehydration.  Drug screen is negative.  MRI concerning for multiple small microinfarctions and microhemorrhage.  Discussed with radiologist who is concerned for possible embolic events, vasculitis.  He has no history of TBI, substance abuse or sickle cell anemia.  No family history of sickle cell disease.  Will discuss again with neurology.   4:55 AM  D/w Dr. Wilford Corner with neurology.  Appreciate his help.  He has reviewed patient's MRI and recommends medicine admission.  He will see patient in consult.  Patient has not been updated with this plan.  He is does not have a local PCP or neurologist.   5:20 AM Discussed patient's case with hospitalist, Dr. Toniann Fail.  I have recommended admission and patient (and family if present) agree with this plan. Admitting physician will place admission orders.   I reviewed all nursing notes, vitals, pertinent previous records,  EKGs, lab and urine results, imaging (as available).     Jared Cahn, Layla Maw, DO 01/03/18 [phone]

## 2018-01-03 NOTE — Progress Notes (Signed)
PROGRESS NOTE    Manuel Baker  ZOX:[id number] DOB: [birth year] DOA: 01/02/2018 PCP: System, Pcp Not In   Brief Narrative: Manuel Baker is a 22 y.o. 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 Patient presented after concern for seizure and found to have multiple strokes.    Assessment & Plan:   Principal Problem:   Acute cardioembolic stroke [facility]) Active Problems:   Uncontrolled type 1 diabetes mellitus with hyperglycemia (HCC)   Seizure (HCC)   Stroke (cerebrum) (HCC)   Acute stroke Thought secondary to cardiac source in setting of Ebstein's anomaly. ?endocarditis. -Neurology recommendations -Cardiology consulted for Transesophageal Echocardiogram and loop recorder -Continue statin -Continue aspirin  Seizure Patient with history. Not on antiepileptics as an outpatient -Neurology recommendations: Keppra -Seizure precautions  Diabetes mellitus, type 1, uncontrolled with hyperglycemia Hyperglycemic overnight. Hemoglobin A1C of 12.2% -Continue Lantus 30 units daily -Continue SSI -Discussed dietary indiscretions.  Ebstein's anomaly Patient receives care at [facility], in [place]   DVT prophylaxis: Lovenox Code Status: Full code Family Communication: Father at bedside, mother on telephone Disposition Plan: Home pending stroke workup   Consultants:   Neurology/Stroke  Cardiology  Procedures:   None  Antimicrobials:  None    Subjective: No complaints  Objective: Vitals:   01/03/18 1130 01/03/18 1145 01/03/18 1200 01/03/18 1300  BP: 113/64 112/62 106/62 110/65  Pulse: 73 81 78 86  Resp: 19 17 17 18   Temp:      TempSrc:      SpO2: 97% 96% 97% 96%  Weight:       No intake or output data in the 24 hours ending 01/03/18 1356 Filed Weights   01/03/18 0443  Weight: 55.8 kg (123 lb)    Examination:  General exam:  Appears calm and comfortable Respiratory system: Respiratory effort normal. Central nervous system: Alert and oriented. Psychiatry: Judgement and insight appear normal. Mood & affect appropriate.     Data Reviewed: I have personally reviewed following labs and imaging studies  CBC: Recent Labs  Lab 01/02/18 1828 01/03/18 0621  WBC 13.1* 8.0  HGB 17.3* 14.4  HCT 48.4 41.9  MCV 81.2 80.4  PLT 265 269   Basic Metabolic Panel: Recent Labs  Lab 01/02/18 1828 01/03/18 0621  NA 139 130*  K 3.9 4.3  CL 102 101  CO2 24 17*  GLUCOSE 255* 522*  BUN 10 11  CREATININE 1.16 1.30*  CALCIUM 9.4 8.4*   GFR: CrCl cannot be calculated (Unknown ideal weight.). Liver Function Tests: Recent Labs  Lab 01/03/18 0621  AST 72*  ALT 39  ALKPHOS 88  BILITOT 1.2  PROT 5.5*  ALBUMIN 3.1*   No results for input(s): LIPASE, AMYLASE in the last 168 hours. No results for input(s): AMMONIA in the last 168 hours. Coagulation Profile: No results for input(s): INR, PROTIME in the last 168 hours. Cardiac Enzymes: No results for input(s): CKTOTAL, CKMB, CKMBINDEX, TROPONINI in the last 168 hours. BNP (last 3 results) No results for input(s): PROBNP in the last 8760 hours. HbA1C: Recent Labs    01/03/18 0623  HGBA1C 12.2*   CBG: Recent Labs  Lab 01/02/18 1832 01/03/18 0730 01/03/18 1036 01/03/18 1231  GLUCAP 215* 497* 147* 116*   Lipid Profile: Recent Labs    01/03/18 0623  CHOL 137  HDL 49  LDLCALC 69  TRIG 97  CHOLHDL 2.8   Thyroid  Function Tests: No results for input(s): TSH, T4TOTAL, FREET4, T3FREE, THYROIDAB in the last 72 hours. Anemia Panel: No results for input(s): VITAMINB12, FOLATE, FERRITIN, TIBC, IRON, RETICCTPCT in the last 72 hours. Sepsis Labs: No results for input(s): PROCALCITON, LATICACIDVEN in the last 168 hours.  No results found for this or any previous visit (from the past 240 hour(s)).       Radiology Studies: Ct Angio Head W Or Wo  Contrast  Result Date: 01/03/2018 CLINICAL DATA:  Stroke follow-up.  History of seizures and diabetes. EXAM: CT ANGIOGRAPHY HEAD AND NECK TECHNIQUE: Multidetector CT imaging of the head and neck was performed using the standard protocol during bolus administration of intravenous contrast. Multiplanar CT image reconstructions and MIPs were obtained to evaluate the vascular anatomy. Carotid stenosis measurements (when applicable) are obtained utilizing NASCET criteria, using the distal internal carotid diameter as the denominator. CONTRAST:  50mL ISOVUE-370 IOPAMIDOL (ISOVUE-370) INJECTION 76% COMPARISON:  Brain MRI from earlier today FINDINGS: CTA NECK FINDINGS Aortic arch: Changes of previous cardiopulmonary bypass. Right carotid system: Vessels are smooth and widely patent. Comparatively small to the left carotid system due to aplastic right A1 segment and left fetal type PCA. 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 Vessels are smooth and widely patent. Vertebral arteries: The left vertebral artery arises from the arch. No proximal subclavian stenosis. Codominant vertebral arteries that are both smooth and widely patent to the dura Skeleton: No acute or aggressive finding. Other neck: Negative soft tissues. Upper chest: Large appearance of the pulmonary artery outflow tract this patient history of congenital heart disease. Review of the MIP images confirms the above findings CTA HEAD FINDINGS Anterior circulation: Small right ICA in the setting of aplastic right A1 segment and fetal type left PCA. There is no branch occlusion or beading. No flow limiting stenosis. No evidence of aneurysm or vascular malformation. Posterior circulation: Small vertebrobasilar artery due to fetal type circulation on the left. Right pica and dominant left aica. The vertebral and basilar arteries are smooth and diffusely patent. Symmetric bilateral PCA flow. Venous sinuses: Patent Anatomic variants: As  above Delayed phase: No abnormal intracranial enhancement. Small remote right cerebellar infarct. Review of the MIP images confirms the above findings IMPRESSION: 1. Negative CTA of the head and neck. No occlusion, stenosis, or embolic source identified. 2. Partially visualized postoperative heart. History of congenital heart disease. Electronically Signed   By: Marnee SpringJonathon  Watts M.D.   On: 01/03/2018 07:27   Dg Chest 1 View  Result Date: 01/03/2018 CLINICAL DATA:  Leukocytosis.  History of Ebstein's anomaly. EXAM: CHEST 1 VIEW COMPARISON:  None. FINDINGS: The cardiac silhouette is mildly enlarged. Normal pulmonary vascularity. No focal consolidation, pleural effusion, or pneumothorax. No acute osseous abnormality. Prior median sternotomy with fracture of the second most superior wire. IMPRESSION: Cardiomegaly.  No active disease. Electronically Signed   By: Obie DredgeWilliam T Derry M.D.   On: 01/03/2018 07:26   Ct Head Wo Contrast  Result Date: 01/02/2018 CLINICAL DATA:  Seizure, new, nontraumatic, 18-40 years. EXAM: CT HEAD WITHOUT CONTRAST TECHNIQUE: Contiguous axial images were obtained from the base of the skull through the vertex without intravenous contrast. COMPARISON:  None. FINDINGS: Brain: A benign-appearing cyst adjacent to the posterior horn of the left lateral ventricle measures 2.4 x 2.2 x 2.2 cm. There is focal dilation of the posterior horn of left lateral ventricle as well. No acute infarct, hemorrhage, or mass lesion is present. No migrational anomaly is evident. Temporal lobes are unremarkable.  Remote lacunar infarcts are present posteriorly in the right cerebellum. Brainstem and cerebellum are otherwise normal. Vascular: No hyperdense vessel or unexpected calcification. Skull: Calvarium is intact. No focal lytic or blastic lesions are present. No significant extracranial soft tissue injury is present. Sinuses/Orbits: The paranasal sinuses and mastoid air cells are clear. Globes and orbits are within  normal limits. Other: None. IMPRESSION: 1. 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 There is no definite communication with the lateral ventricle. MRI of the head without and with contrast could be used for further evaluation. 2. No acute intracranial abnormality or focal abnormality to explain seizures otherwise. Electronically Signed   By: Marin Roberts M.D.   On: 01/02/2018 19:38   Ct Angio Neck W Or Wo Contrast  Result Date: 01/03/2018 CLINICAL DATA:  Stroke follow-up.  History of seizures and diabetes. EXAM: CT ANGIOGRAPHY HEAD AND NECK TECHNIQUE: Multidetector CT imaging of the head and neck was performed using the standard protocol during bolus administration of intravenous contrast. Multiplanar CT image reconstructions and MIPs were obtained to evaluate the vascular anatomy. Carotid stenosis measurements (when applicable) are obtained utilizing NASCET criteria, using the distal internal carotid diameter as the denominator. CONTRAST:  50mL ISOVUE-370 IOPAMIDOL (ISOVUE-370) INJECTION 76% COMPARISON:  Brain MRI from earlier today FINDINGS: CTA NECK FINDINGS Aortic arch: Changes of previous cardiopulmonary bypass. Right carotid system: Vessels are smooth and widely patent. Comparatively small to the left carotid system due to aplastic right A1 segment and left fetal type PCA. 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 Vessels are smooth and widely patent. Vertebral arteries: The left vertebral artery arises from the arch. No proximal subclavian stenosis. Codominant vertebral arteries that are both smooth and widely patent to the dura Skeleton: No acute or aggressive finding. Other neck: Negative soft tissues. Upper chest: Large appearance of the pulmonary artery outflow tract this patient history of congenital heart disease. Review of the MIP images confirms the above findings CTA HEAD FINDINGS Anterior circulation: Small right  ICA in the setting of aplastic right A1 segment and fetal type left PCA. There is no branch occlusion or beading. No flow limiting stenosis. No evidence of aneurysm or vascular malformation. Posterior circulation: Small vertebrobasilar artery due to fetal type circulation on the left. Right pica and dominant left aica. The vertebral and basilar arteries are smooth and diffusely patent. Symmetric bilateral PCA flow. Venous sinuses: Patent Anatomic variants: As above Delayed phase: No abnormal intracranial enhancement. Small remote right cerebellar infarct. Review of the MIP images confirms the above findings IMPRESSION: 1. Negative CTA of the head and neck. No occlusion, stenosis, or embolic source identified. 2. Partially visualized postoperative heart. History of congenital heart disease. Electronically Signed   By: Marnee Spring M.D.   On: 01/03/2018 07:27   Mr Laqueta Jean And Wo Contrast  Result Date: 01/03/2018 CLINICAL DATA:  22 y/o M; Seizure, new, nontraumatic, 18-40 yrs. History of seizures and prior seizure therapy. EXAM: MRI HEAD WITHOUT AND WITH CONTRAST TECHNIQUE: Multiplanar, multiecho pulse sequences of the brain and surrounding structures were obtained without and with intravenous contrast. CONTRAST:  11mL MULTIHANCE GADOBENATE DIMEGLUMINE 529 MG/ML IV SOLN COMPARISON:  01/02/2018 CT head FINDINGS: Brain: Punctate foci of reduced diffusion are present within the bilateral occipital lobes, right cerebellar hemisphere, and there are subcentimeter foci of infarction within the left frontal centrum semiovale and superior left putamen. No associated hemorrhage or mass effect. Faint T2 FLAIR signal abnormality is present  associated with the largest left frontal centrum semiovale lesion. Small chronic infarctions in the right cerebellar hemisphere. There several punctate foci of susceptibility hypointensity scattered throughout the supratentorial brain and the left cerebellar hemisphere compatible with  hemosiderin deposition of chronic microhemorrhage. 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 5 mm T1 hyperintense focus within the right paramedian sella poorly visualized on the gradient T1 sequence, possibly small lipoma or proteinaceous cyst. Normal size pituitary gland with midline infundibulum. Complete corpus callosum and vermis. Hippocampi are symmetric in size and signal. No disorder cortical formation, cortical dysplasia, or heterotopia identified. Vascular: Normal flow voids. Skull and upper cervical spine: Normal marrow signal. Sinuses/Orbits: Negative. Other: None. IMPRESSION: 1. Small foci of reduced diffusion are present within the left frontal white matter, left putamen, bilateral occipital lobes, and right cerebellum probably representing acute/early subacute infarctions. The pattern is atypical for seizure activity. Additionally, there are several punctate sequelae of chronic microhemorrhage throughout the brain and chronic infarcts in the right cerebellum. Given patient age, embolic phenomenon possibly with cardiac defect, sequelae of vasculitis, traumatic brain injury, or hematologic disorder should be considered. 2. 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 3. No additional structural cause of seizure identified. These results were called by telephone at the time of interpretation on 01/03/2018 at 4:34 am to Dr. Rochele Raring , who verbally acknowledged these results. Electronically Signed   By: Mitzi Hansen M.D.   On: 01/03/2018 04:47        Scheduled Meds: .  stroke: mapping our early stages of recovery book   Does not apply Once  . aspirin  300 mg Rectal Daily   Or  . aspirin  325 mg Oral Daily  . atorvastatin  80 mg Oral q1800  . enoxaparin  (LOVENOX) injection  40 mg Subcutaneous Q24H  . insulin aspart  0-9 Units Subcutaneous TID WC  . insulin glargine  30 Units Subcutaneous QHS  . levETIRAcetam  500 mg Oral BID   Continuous Infusions: . sodium chloride 100 mL/hr at 01/03/18 0927     LOS: 0 days     Jacquelin Hawking, MD [facility] 01/03/2018, 1:56 PM Pager: [phone]  If 7PM-7AM, please contact night-coverage [email or web address] Password [email or web address] 01/03/2018, 1:56 PM

## 2018-01-03 NOTE — Consult Note (Addendum)
Neurology Consultation  Reason for Consult: Concern for seizure, MRI abnormality Referring Physician: Dr Leonides Schanz  CC: possible seizure  History is obtained from: patient  HPI: Manuel Baker is a 22 y.o. male who is a full-time Ship broker at [employer], with past medical history of insulin-dependent diabetes and a remote history of seizure disorders during middle school for which she went on medications, chronic headaches, came to the ER for evaluation of what he describes as "episode".  He said that he woke up early yesterday morning with his body feeling sore all over.  Upon waking up he also found bite marks on his tongue on both sides.  He sought help at the [facility] from where he was referred to the emergency room. He said that he probably had seizures as a child in middle school and was on medication for it up until the end of ninth grade.  He has not had any seizures since then.  He is unable to tell me what antiepileptic he was on at the time. He had been seen by a neurologist in [place] and EEG and MRIs have been done in the past with no results available for review. Currently, he denies any headaches, focal tingling numbness or weakness.  Denies any headaches.  Denies nausea vomiting. Does not report any preceding illnesses, fevers or chills prior to presentation. Denies any chest pain shortness of breath or palpitations. He denies any illicit drug use other than using marijuana.  Does not use tobacco or alcohol.  Denies any family history of strokes in young.  Denies family history of seizures. He does not know of any family member with a history of sickle cell.   LKW: 8 AM on 01/02/2018 tpa given?: no, outside the window Premorbid modified Rankin scale (mRS): 0  ROS: ROS was performed and is negative except as noted in the HPI.    Past medical history Insulin-dependent diabetes  Family history History reviewed. No pertinent family history.   Social History:   Charity fundraiser Denies tobacco use Denies alcohol use Endorses occasional marijuana use Denies cocaine/heroine/stimulant abuse  Medications No current facility-administered medications for this encounter.   Current Outpatient Medications:  .  insulin glargine (LANTUS) 100 UNIT/ML injection, Inject 30 Units into the skin at bedtime., Disp: , Rfl:  .  insulin lispro (HUMALOG) 100 UNIT/ML injection, Inject 10-30 Units into the skin 3 (three) times daily before meals. Sliding scale, Disp: , Rfl:   Exam: Current vital signs: BP 132/82   Pulse 89   Temp 98.3 F (36.8 C)   Resp 17   Wt 55.8 kg (123 lb)   SpO2 93%  Vital signs in last 24 hours: Temp:  [98.3 F (36.8 C)-99 F (37.2 C)] 98.3 F (36.8 C) (02/08 2217) Pulse Rate:  [89-92] 89 (02/08 2217) Resp:  [16-17] 17 (02/08 2217) BP: (132-140)/(82) 132/82 (02/08 2217) SpO2:  [93 %-95 %] 93 % (02/08 2217) Weight:  [55.8 kg (123 lb)] 55.8 kg (123 lb) (02/09 0443)  GENERAL: Awake, alert in NAD HEENT: - Normocephalic and atraumatic, dry mm, no LN++, no Thyromegally.  Bite marks on both lateral aspects of the tongue LUNGS - Clear to auscultation bilaterally with no wheezes CV - S1S2 RRR, no m/r/g, equal pulses bilaterally. ABDOMEN - Soft, nontender, nondistended with normoactive BS Ext: warm, well perfused, intact peripheral pulses, no edema NEURO:  Mental Status: AA&Ox3  Language: speech is not dysarthric.  Naming, repetition, fluency, and comprehension intact. Cranial Nerves: PERRL. EOMI,  visual fields full, no facial asymmetry, facial sensation intact, hearing intact, tongue/uvula/soft palate midline, normal sternocleidomastoid and trapezius muscle strength. No evidence of tongue atrophy or fibrillations Motor: 5/5 all over Tone: is normal and bulk is normal Sensation- Intact to light touch bilaterally Coordination: FTN intact bilaterally, no ataxia in BLE. Gait- deferred NIHSS - 0  Labs I have reviewed labs in epic and  the results pertinent to this consultation are:  CBC    Component Value Date/Time   WBC 13.1 (H) 01/02/2018 1828   RBC 5.96 (H) 01/02/2018 1828   HGB 17.3 (H) 01/02/2018 1828   HCT 48.4 01/02/2018 1828   PLT 265 01/02/2018 1828   MCV 81.2 01/02/2018 1828   MCH 29.0 01/02/2018 1828   MCHC 35.7 01/02/2018 1828   RDW 13.4 01/02/2018 1828   CMP     Component Value Date/Time   NA 139 01/02/2018 1828   K 3.9 01/02/2018 1828   CL 102 01/02/2018 1828   CO2 24 01/02/2018 1828   GLUCOSE 255 (H) 01/02/2018 1828   BUN 10 01/02/2018 1828   CREATININE 1.16 01/02/2018 1828   CALCIUM 9.4 01/02/2018 1828   GFRNONAA >60 01/02/2018 1828   GFRAA >60 01/02/2018 1828  Utox - unremaerkable  Imaging I have reviewed the images obtained: Noncontrast CT of the head is unremarkable with the exception of 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 MRI examination of the brain -interesting constellation of findings with scattered embolic-looking strokes in multiple vascular territories which are punctate.  Also multiple microhemorrhages as evidenced by lesion seen on the GRE sequences.  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  Assessment:  22 year old man with a history of insulin-dependent diabetes presenting for evaluation for an abnormal episode concerning for seizure. As a part of workup, noncontrast CT of the head was done which was unremarkable other than the left occipital nerve deficits. MRI done to complete the workup prior to discharging the patient reveals small foci of restricted diffusion in the left frontal white matter, left putamen bilateral occipital lobes and right cerebellum suggestive of acute early subacute infarct. These are very punctate embolic-looking infarcts which are not typical for seizures, which produces cortical restricted diffusion.  Also seen are multiple microhemorrhages  and chronic infarcts in the cerebellum. Differentials according to radiology report could include embolic phenomenon with cardiac defect/vasculitis/TBI/hematological disorder such as sickle cell. I would recommend admission for further workup.  Impression: Acute ischemic stroke -- embolic-looking --differentials include cardioembolic/atheroembolic/vasculitis/sickle cell History of seizure in the past  Recommendations:  For stroke work up: -Admit to hospitalist -Telemetry monitoring -Allow for permissive hypertension for the first 24-48h - only treat PRN if SBP >220 mmHg. Blood pressures can be gradually normalized to SBP<140 upon discharge. -CT Angiogram of Head and neck -Echocardiogram - might need TEE after a 2D echo  -HgbA1c, fasting lipid panel -Frequent neuro checks -Prophylactic therapy-Antiplatelet med: Aspirin - dose '325mg'$  PO or '300mg'$  PR -Atorvastatin 80 mg PO daily -Hypercoagulable panel for Stroke work up in young (ordered) -ANA, cANCA pANCA dsDNA. SS-a, SS-B abs (ordered) -ESR, CRP (ordered) -Sickle cell screen -Risk factor modification -I discussed the importance of exercise as well as smoking/alcohol/illicit drug use cessation. -PT consult, OT consult, Speech consult   For seizure: -Given that he was previously on antiepileptics and multiplicity of these events, would recommend starting the patient on Keppra 500 mg twice daily for now. -Maintain seizure precautions -  No driving for 6 mo since seizure free per [place] laws. Seizure precautions below. -Routine EEG in the AM  Per [facility] statutes, patients with seizures are not allowed to drive until they have been seizure-free for six months.  Use caution when using heavy equipment or power tools. Avoid working on ladders or at heights. Take showers instead of baths. Ensure the water temperature is not too high on the home water heater. Do not go swimming alone. Do not lock yourself in a room alone (i.e. bathroom).  When caring for infants or small children, sit down when holding, feeding, or changing them to minimize risk of injury to the child in the event you have a seizure. Maintain good sleep hygiene. Avoid alcohol.  If patient has another seizure, call 911 and bring them back to the ED if: A. The seizure lasts longer than 5 minutes.  B. The patient doesn't wake shortly after the seizure or has new problems such as difficulty seeing, speaking or moving following the seizure C. The patient was injured during the seizure D. The patient has a temperature over 102 F (39C) E. The patient vomited during the seizure and now is having trouble breathing  -- Amie Portland, MD [facility] Pager: [phone] If 7pm to 7am, please call on call as listed on AMION.   Please page stroke NP/PA/MD (listed on AMION)  from 8am-4 pm as this patient will be followed by the stroke team at this point.

## 2018-01-03 NOTE — ED Notes (Signed)
Contacted admitting MD about pt CBG of 497. MD waiting for CMP to result for further orders.

## 2018-01-03 NOTE — Progress Notes (Signed)
Cardiology Consultation:   Patient ID: Manuel Baker; [id number]; 1995/12/06   Admit date: 01/02/2018 Date of Consult: 01/03/2018  Primary Care Provider: System, Pcp Not In Primary Cardiologist:  Welma Mccombs  Primary Electrophysiologist:     Patient Profile:   Manuel Baker is a 22 y.o. male with a hx of  Epsteins anomaly  who is being seen today for the evaluation of  stroke  at the request of  Dr. Caleb Popp .  History of Present Illness:   Manuel Baker is a 22 yo with hx of Ebsteins anomaly. 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  Had an episode yesterday - woke up with seizure like activity.  Had bit his tounge. Now has some sluggishness. MRI of brain shows multiple bilateral ( left frontal white matter, letf putamen,right cerebellum, bilateral occiptal lobe) foci suggesting acute / sub acute strokes.   Patient used to have significant shortness of breath with exertion, cyanosis of his lips and fingertips and clubbing.  Since his surgery at age 59 he is not had any of these symptoms.  He is able to exercise on a regular basis.  He walks on the treadmill or participate in sports without severe   shortness of breath or cyanosis.  Past Medical History:  Diagnosis Date  . Congenital heart disease   . Diabetes mellitus without complication (HCC)   . Seizures (HCC)     Past Surgical History:  Procedure Laterality Date  . open heart surgery       Home Medications:  Prior to Admission medications   Medication Sig Start Date End Date Taking? Authorizing Provider  insulin glargine (LANTUS) 100 UNIT/ML injection Inject 30 Units into the skin at bedtime.   Yes [provider]  insulin lispro (HUMALOG) 100 UNIT/ML injection Inject 10-30 Units into the skin 3 (three) times daily before meals. Sliding scale   Yes [provider]    Inpatient Medications: Scheduled Meds: .  stroke: mapping our early  stages of recovery book   Does not apply Once  . aspirin  300 mg Rectal Daily   Or  . aspirin  325 mg Oral Daily  . atorvastatin  80 mg Oral q1800  . enoxaparin (LOVENOX) injection  40 mg Subcutaneous Q24H  . insulin aspart  0-9 Units Subcutaneous TID WC  . insulin glargine  30 Units Subcutaneous QHS  . levETIRAcetam  500 mg Oral BID   Continuous Infusions: . sodium chloride 100 mL/hr at 01/03/18 0927   PRN Meds: acetaminophen **OR** acetaminophen (TYLENOL) oral liquid 160 mg/5 mL **OR** acetaminophen  Allergies:   No Known Allergies  Social History:   Social History   Socioeconomic History  . Marital status: Single    Spouse name: Not on file  . Number of children: Not on file  . Years of education: Not on file  . Highest education level: Not on file  Social Needs  . Financial resource strain: Not on file  . Food insecurity - worry: Not on file  . Food insecurity - inability: Not on file  . Transportation needs - medical: Not on file  . Transportation needs - non-medical: Not on file  Occupational History  . Not on file  Tobacco Use  . Smoking status: Never Smoker  . Smokeless tobacco: Never Used  Substance and Sexual Activity  . Alcohol use: Yes    Frequency: Never    Comment:  social  . Drug use: Yes    Types: Marijuana  . Sexual activity: Not on file  Other Topics Concern  . Not on file  Social History Narrative  . Not on file    Family History:    Family History  Problem Relation Age of Onset  . Diabetes Mellitus I Sister      ROS:  Please see the history of present illness.   All other ROS reviewed and negative.     Physical Exam/Data:   Vitals:   01/03/18 1130 01/03/18 1145 01/03/18 1200 01/03/18 1300  BP: 113/64 112/62 106/62 110/65  Pulse: 73 81 78 86  Resp: 19 17 17 18   Temp:      TempSrc:      SpO2: 97% 96% 97% 96%  Weight:       No intake or output data in the 24 hours ending 01/03/18 1435 Filed Weights   01/03/18 0443  Weight:  123 lb (55.8 kg)   There is no height or weight on file to calculate BMI.  General:  Young black male,   Speech is slightly slow HEENT: normal Lymph: no adenopathy Neck: no JVD Endocrine:  No thryomegaly Vascular: No carotid bruits; FA pulses 2+ bilaterally without bruits  Cardiac:  RR, ? Split s1, soft murmur  Lungs:  clear to auscultation bilaterally, no wheezing, rhonchi or rales  Abd: soft, nontender, no hepatomegaly  Ext: no edema Musculoskeletal:  No deformities, BUE and BLE strength normal and equal Skin: warm and dry  Neuro:  CNs 2-12 intact, no focal abnormalities noted Psych:  Normal affect   EKG:  The EKG was personally reviewed and demonstrates:  NSR at 72.  RBBB   Telemetry:  Telemetry was personally reviewed and demonstrates:   nsr   Relevant CV Studies:   Laboratory Data:  Chemistry Recent Labs  Lab 01/02/18 1828 01/03/18 0621  NA 139 130*  K 3.9 4.3  CL 102 101  CO2 24 17*  GLUCOSE 255* 522*  BUN 10 11  CREATININE 1.16 1.30*  CALCIUM 9.4 8.4*  GFRNONAA >60 >60  GFRAA >60 >60  ANIONGAP 13 12    Recent Labs  Lab 01/03/18 0621  PROT 5.5*  ALBUMIN 3.1*  AST 72*  ALT 39  ALKPHOS 88  BILITOT 1.2   Hematology Recent Labs  Lab 01/02/18 1828 01/03/18 0621  WBC 13.1* 8.0  RBC 5.96* 5.21  HGB 17.3* 14.4  HCT 48.4 41.9  MCV 81.2 80.4  MCH 29.0 27.6  MCHC 35.7 34.4  RDW 13.4 13.4  PLT 265 269   Cardiac EnzymesNo results for input(s): TROPONINI in the last 168 hours. No results for input(s): TROPIPOC in the last 168 hours.  BNPNo results for input(s): BNP, PROBNP in the last 168 hours.  DDimer No results for input(s): DDIMER in the last 168 hours.  Radiology/Studies:  Ct Angio Head W Or Wo Contrast  Result Date: 01/03/2018 CLINICAL DATA:  Stroke follow-up.  History of seizures and diabetes. EXAM: CT ANGIOGRAPHY HEAD AND NECK TECHNIQUE: Multidetector CT imaging of the head and neck was performed using the standard protocol during bolus  administration of intravenous contrast. Multiplanar CT image reconstructions and MIPs were obtained to evaluate the vascular anatomy. Carotid stenosis measurements (when applicable) are obtained utilizing NASCET criteria, using the distal internal carotid diameter as the denominator. CONTRAST:  50mL ISOVUE-370 IOPAMIDOL (ISOVUE-370) INJECTION 76% COMPARISON:  Brain MRI from earlier today FINDINGS: CTA NECK FINDINGS Aortic arch: Changes of previous cardiopulmonary bypass.  Right carotid system: Vessels are smooth and widely patent. Comparatively small to the left carotid system due to aplastic right A1 segment and left fetal type PCA. 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 Vessels are smooth and widely patent. Vertebral arteries: The left vertebral artery arises from the arch. No proximal subclavian stenosis. Codominant vertebral arteries that are both smooth and widely patent to the dura Skeleton: No acute or aggressive finding. Other neck: Negative soft tissues. Upper chest: Large appearance of the pulmonary artery outflow tract this patient history of congenital heart disease. Review of the MIP images confirms the above findings CTA HEAD FINDINGS Anterior circulation: Small right ICA in the setting of aplastic right A1 segment and fetal type left PCA. There is no branch occlusion or beading. No flow limiting stenosis. No evidence of aneurysm or vascular malformation. Posterior circulation: Small vertebrobasilar artery due to fetal type circulation on the left. Right pica and dominant left aica. The vertebral and basilar arteries are smooth and diffusely patent. Symmetric bilateral PCA flow. Venous sinuses: Patent Anatomic variants: As above Delayed phase: No abnormal intracranial enhancement. Small remote right cerebellar infarct. Review of the MIP images confirms the above findings IMPRESSION: 1. Negative CTA of the head and neck. No occlusion, stenosis, or embolic source identified.  2. Partially visualized postoperative heart. History of congenital heart disease. Electronically Signed   By: Marnee Spring M.D.   On: 01/03/2018 07:27   Dg Chest 1 View  Result Date: 01/03/2018 CLINICAL DATA:  Leukocytosis.  History of Ebstein's anomaly. EXAM: CHEST 1 VIEW COMPARISON:  None. FINDINGS: The cardiac silhouette is mildly enlarged. Normal pulmonary vascularity. No focal consolidation, pleural effusion, or pneumothorax. No acute osseous abnormality. Prior median sternotomy with fracture of the second most superior wire. IMPRESSION: Cardiomegaly.  No active disease. Electronically Signed   By: Obie Dredge M.D.   On: 01/03/2018 07:26   Ct Head Wo Contrast  Result Date: 01/02/2018 CLINICAL DATA:  Seizure, new, nontraumatic, 18-40 years. EXAM: CT HEAD WITHOUT CONTRAST TECHNIQUE: Contiguous axial images were obtained from the base of the skull through the vertex without intravenous contrast. COMPARISON:  None. FINDINGS: Brain: A benign-appearing cyst adjacent to the posterior horn of the left lateral ventricle measures 2.4 x 2.2 x 2.2 cm. There is focal dilation of the posterior horn of left lateral ventricle as well. No acute infarct, hemorrhage, or mass lesion is present. No migrational anomaly is evident. Temporal lobes are unremarkable. Remote lacunar infarcts are present posteriorly in the right cerebellum. Brainstem and cerebellum are otherwise normal. Vascular: No hyperdense vessel or unexpected calcification. Skull: Calvarium is intact. No focal lytic or blastic lesions are present. No significant extracranial soft tissue injury is present. Sinuses/Orbits: The paranasal sinuses and mastoid air cells are clear. Globes and orbits are within normal limits. Other: None. IMPRESSION: 1. 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 There is no definite communication with the lateral ventricle. MRI of the head without and with contrast could be used for  further evaluation. 2. No acute intracranial abnormality or focal abnormality to explain seizures otherwise. Electronically Signed   By: Marin Roberts M.D.   On: 01/02/2018 19:38   Ct Angio Neck W Or Wo Contrast  Result Date: 01/03/2018 CLINICAL DATA:  Stroke follow-up.  History of seizures and diabetes. EXAM: CT ANGIOGRAPHY HEAD AND NECK TECHNIQUE: Multidetector CT imaging of the head and neck was performed using the standard protocol during bolus administration of intravenous  contrast. Multiplanar CT image reconstructions and MIPs were obtained to evaluate the vascular anatomy. Carotid stenosis measurements (when applicable) are obtained utilizing NASCET criteria, using the distal internal carotid diameter as the denominator. CONTRAST:  50mL ISOVUE-370 IOPAMIDOL (ISOVUE-370) INJECTION 76% COMPARISON:  Brain MRI from earlier today FINDINGS: CTA NECK FINDINGS Aortic arch: Changes of previous cardiopulmonary bypass. Right carotid system: Vessels are smooth and widely patent. Comparatively small to the left carotid system due to aplastic right A1 segment and left fetal type PCA. 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 Vessels are smooth and widely patent. Vertebral arteries: The left vertebral artery arises from the arch. No proximal subclavian stenosis. Codominant vertebral arteries that are both smooth and widely patent to the dura Skeleton: No acute or aggressive finding. Other neck: Negative soft tissues. Upper chest: Large appearance of the pulmonary artery outflow tract this patient history of congenital heart disease. Review of the MIP images confirms the above findings CTA HEAD FINDINGS Anterior circulation: Small right ICA in the setting of aplastic right A1 segment and fetal type left PCA. There is no branch occlusion or beading. No flow limiting stenosis. No evidence of aneurysm or vascular malformation. Posterior circulation: Small vertebrobasilar artery due to fetal  type circulation on the left. Right pica and dominant left aica. The vertebral and basilar arteries are smooth and diffusely patent. Symmetric bilateral PCA flow. Venous sinuses: Patent Anatomic variants: As above Delayed phase: No abnormal intracranial enhancement. Small remote right cerebellar infarct. Review of the MIP images confirms the above findings IMPRESSION: 1. Negative CTA of the head and neck. No occlusion, stenosis, or embolic source identified. 2. Partially visualized postoperative heart. History of congenital heart disease. Electronically Signed   By: Marnee Spring M.D.   On: 01/03/2018 07:27   Mr Laqueta Jean And Wo Contrast  Result Date: 01/03/2018 CLINICAL DATA:  22 y/o M; Seizure, new, nontraumatic, 18-40 yrs. History of seizures and prior seizure therapy. EXAM: MRI HEAD WITHOUT AND WITH CONTRAST TECHNIQUE: Multiplanar, multiecho pulse sequences of the brain and surrounding structures were obtained without and with intravenous contrast. CONTRAST:  11mL MULTIHANCE GADOBENATE DIMEGLUMINE 529 MG/ML IV SOLN COMPARISON:  01/02/2018 CT head FINDINGS: Brain: Punctate foci of reduced diffusion are present within the bilateral occipital lobes, right cerebellar hemisphere, and there are subcentimeter foci of infarction within the left frontal centrum semiovale and superior left putamen. No associated hemorrhage or mass effect. Faint T2 FLAIR signal abnormality is present associated with the largest left frontal centrum semiovale lesion. Small chronic infarctions in the right cerebellar hemisphere. There several punctate foci of susceptibility hypointensity scattered throughout the supratentorial brain and the left cerebellar hemisphere compatible with hemosiderin deposition of chronic microhemorrhage. 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 5 mm T1 hyperintense focus within the right paramedian sella poorly visualized on the gradient T1 sequence, possibly small lipoma or proteinaceous cyst. Normal size pituitary gland with midline infundibulum. Complete corpus callosum and vermis. Hippocampi are symmetric in size and signal. No disorder cortical formation, cortical dysplasia, or heterotopia identified. Vascular: Normal flow voids. Skull and upper cervical spine: Normal marrow signal. Sinuses/Orbits: Negative. Other: None. IMPRESSION: 1. Small foci of reduced diffusion are present within the left frontal white matter, left putamen, bilateral occipital lobes, and right cerebellum probably representing acute/early subacute infarctions. The pattern is  atypical for seizure activity. Additionally, there are several punctate sequelae of chronic microhemorrhage throughout the brain and chronic infarcts in the right cerebellum. Given patient age, embolic phenomenon possibly with cardiac defect, sequelae of vasculitis, traumatic brain injury, or hematologic disorder should be considered. 2. 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 3. No additional structural cause of seizure identified. These results were called by telephone at the time of interpretation on 01/03/2018 at 4:34 am to Dr. Rochele Raring , who verbally acknowledged these results. Electronically Signed   By: Mitzi Hansen M.D.   On: 01/03/2018 04:47    Assessment and Plan:   1. 1.  Acute bilateral CVA: Per the neurologist, it appears that his stroke may been due to an embolic source.  He has a history of Epstein's anomaly and is status post Epstein's repair at age 60.  He was told at that time that they were not able to do a complete repair and that he would need another surgery sometime later in life.  He was followed for years in [place] but now is up in [place] as a student at  [employer]   I suspect that he  has an atrial septal defect and had a significant shunting of venous blood into the arterial system.  He does not have significant clubbing or cyanosis so it does not sound like he has significant shunting all the time.  I would suggest that we proceed with a transesophageal echo with bubble study.  We can get a regular echocardiogram today to get some idea of his cardiac size and and structure.  We might be able to do a bubble study today although the definitive bubble study will be with a transesophageal echo.  It would be beneficial to do a cardiac MRI at some point.  I suggested to the patient's father that he will probably need to be established with 1 of our local [facility]'s  For further expertice in complex congenital heart defects.     For questions or updates, please contact [facility] Please consult [email or web address] for contact info under Cardiology/STEMI.   Signed, Kristeen Miss, MD  01/03/2018 2:35 PM

## 2018-01-03 NOTE — Evaluation (Signed)
Physical Therapy Evaluation and Discharge Patient Details Name: Manuel Baker MRN: [id number] DOB: [birth year] Today's Date: 01/03/2018   History of Present Illness  Manuel Baker is a 22 y.o. 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 Patient presented after concern for seizure and found to have multiple strokes.  Clinical Impression  Patient evaluated by Physical Therapy with no further acute PT needs identified. All education has been completed and the patient has no further questions. Patient ambulating a high level of ability without loss of balance during dynamic challenges including head turns, marching, and quick turns in the hallway. Gait speed WNL. Patient did have difficulty with SLS on Right side only, possibly due to being tired as he had just awoken. Pt states he feels back to his baseline at this time. No skilled PT indicated at this time. PT is signing off. Thank you for this referral.     Follow Up Recommendations No PT follow up    Equipment Recommendations  None recommended by PT    Recommendations for Other Services       Precautions / Restrictions Restrictions Weight Bearing Restrictions: No      Mobility  Bed Mobility Overal bed mobility: Independent                Transfers Overall transfer level: Independent                  Ambulation/Gait Ambulation/Gait assistance: Independent              Stairs            Wheelchair Mobility    Modified Rankin (Stroke Patients Only) Modified Rankin (Stroke Patients Only) Pre-Morbid Rankin Score: No symptoms Modified Rankin: No significant disability     Balance Overall balance assessment: Modified Independent Sitting-balance support: Feet supported;No upper extremity supported Sitting balance-Leahy Scale: Normal     Standing balance support:  No upper extremity supported Standing balance-Leahy Scale: Good   Single Leg Stance - Right Leg: 4(Loss of balance, held bed to prevent falling backwards) Single Leg Stance - Left Leg: 10 Tandem Stance - Right Leg: 10 Tandem Stance - Left Leg: 10 Rhomberg - Eyes Opened: 10 Rhomberg - Eyes Closed: 10   High Level Balance Comments: Tolerated head turns, high marching, quick turns without loss of balance.             Pertinent Vitals/Pain Pain Assessment: No/denies pain    Home Living Family/patient expects to be discharged to:: Private residence Living Arrangements: Non-relatives/Friends Available Help at Discharge: Friend(s) Type of Home: House           Additional Comments: Pt student at [employer], living in house with friends. Family lives in [place].    Prior Function Level of Independence: Independent         Comments: active plays sports     Hand Dominance   Dominant Hand: Right    Extremity/Trunk Assessment   Upper Extremity Assessment Upper Extremity Assessment: Defer to OT evaluation(no focal weakness)    Lower Extremity Assessment Lower Extremity Assessment: Overall WFL for tasks assessed(no focal weakness)       Communication   Communication: No difficulties  Cognition Arousal/Alertness: Awake/alert Behavior During Therapy: WFL for tasks assessed/performed Overall Cognitive Status: Impaired/Different from baseline Area of Impairment: Following commands  Following Commands: Follows multi-step commands with increased time       General Comments: A little slow processing.      General Comments General comments (skin integrity, edema, etc.): VSS throughout session    Exercises     Assessment/Plan    PT Assessment Patent does not need any further PT services  PT Problem List         PT Treatment Interventions      PT Goals (Current goals can be found in the Care Plan section)  Acute Rehab PT Goals Patient  Stated Goal: Become a pedatric cardiac surgeon PT Goal Formulation: All assessment and education complete, DC therapy    Frequency     Barriers to discharge        Co-evaluation               AM-PAC PT "6 Clicks" Daily Activity  Outcome Measure Difficulty turning over in bed (including adjusting bedclothes, sheets and blankets)?: None Difficulty moving from lying on back to sitting on the side of the bed? : None Difficulty sitting down on and standing up from a chair with arms (e.g., wheelchair, bedside commode, etc,.)?: None Help needed moving to and from a bed to chair (including a wheelchair)?: None Help needed walking in hospital room?: None Help needed climbing 3-5 steps with a railing? : None 6 Click Score: 24    End of Session   Activity Tolerance: Patient tolerated treatment well Patient left: in bed;with call bell/phone within reach Nurse Communication: (Pt hungry) PT Visit Diagnosis: Other symptoms and signs involving the nervous system (W09.811(R29.898)    Time: [id number] PT Time Calculation (min) (ACUTE ONLY): 14 min   Charges:   PT Evaluation $PT Eval Moderate Complexity: 1 Mod     PT G Codes:        [facility] Jamiah Homeyer, PT   [place] [facility] S Genevra Orne 01/03/2018, 4:36 PM

## 2018-01-04 ENCOUNTER — Inpatient Hospital Stay (HOSPITAL_COMMUNITY): Payer: BLUE CROSS/BLUE SHIELD

## 2018-01-04 ENCOUNTER — Other Ambulatory Visit (HOSPITAL_COMMUNITY): Payer: Self-pay

## 2018-01-04 DIAGNOSIS — I639 Cerebral infarction, unspecified: Secondary | ICD-10-CM

## 2018-01-04 DIAGNOSIS — I361 Nonrheumatic tricuspid (valve) insufficiency: Secondary | ICD-10-CM

## 2018-01-04 LAB — GLUCOSE, CAPILLARY
GLUCOSE-CAPILLARY: 165 mg/dL — AB (ref 65–99)
GLUCOSE-CAPILLARY: 319 mg/dL — AB (ref 65–99)
Glucose-Capillary: 177 mg/dL — ABNORMAL HIGH (ref 65–99)
Glucose-Capillary: 276 mg/dL — ABNORMAL HIGH (ref 65–99)

## 2018-01-04 LAB — ECHOCARDIOGRAM COMPLETE
CHL CUP MV DEC (S): 257
E decel time: 257 msec
E/e' ratio: 7.45
FS: 60 % — AB (ref 28–44)
IVS/LV PW RATIO, ED: 0.93
LA ID, A-P, ES: 28 mm
LA diam end sys: 28 mm
LAVOLA4C: 18.2 mL
LDCA: 3.14 cm2
LV E/e'average: 7.45
LV TDI E'MEDIAL: 6
LVEEMED: 7.45
LVELAT: 10.7 cm/s
LVOT diameter: 20 mm
Lateral S' vel: 9.03 cm/s
MV Peak grad: 3 mmHg
MV pk E vel: 79.7 m/s
MVPKAVEL: 35.1 m/s
PW: 8.93 mm — AB (ref 0.6–1.1)
TDI e' lateral: 10.7
WEIGHTICAEL: 1968 [oz_av]

## 2018-01-04 LAB — BASIC METABOLIC PANEL
Anion gap: 9 (ref 5–15)
BUN: 13 mg/dL (ref 6–20)
CO2: 19 mmol/L — ABNORMAL LOW (ref 22–32)
CREATININE: 1.03 mg/dL (ref 0.61–1.24)
Calcium: 8.5 mg/dL — ABNORMAL LOW (ref 8.9–10.3)
Chloride: 109 mmol/L (ref 101–111)
Glucose, Bld: 204 mg/dL — ABNORMAL HIGH (ref 65–99)
Potassium: 3.9 mmol/L (ref 3.5–5.1)
SODIUM: 137 mmol/L (ref 135–145)

## 2018-01-04 LAB — MPO/PR-3 (ANCA) ANTIBODIES

## 2018-01-04 NOTE — Progress Notes (Signed)
LE venous duplex prelim: negative for DVT. Yanci Bachtell Eunice, RDMS, RVT  

## 2018-01-04 NOTE — Progress Notes (Signed)
PROGRESS NOTE    Manuel Baker  ZOX:[id number] DOB: 08/05/1996 DOA: 01/02/2018 PCP: No primary care provider on file.   Brief Narrative: Manuel Baker is a 22 y.o. 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 Patient presented after concern for seizure and found to have multiple strokes.    Assessment & Plan:   Principal Problem:   Acute cardioembolic stroke [facility](HCC) Active Problems:   Uncontrolled type 1 diabetes mellitus with hyperglycemia (HCC)   Seizure (HCC)   Stroke (cerebrum) (HCC)   Ebstein's anomaly   Stroke [facility](HCC)   Acute stroke Thought secondary to cardiac source in setting of Ebstein's anomaly. Transthoracic Echocardiogram pending. Likely with ASD. LE venous dopplers pending -Neurology recommendations: LE dopplers pending -Cardiology consulted for Transesophageal Echocardiogram with bubble study -Continue statin -Continue aspirin  Seizure Patient with history. Not on antiepileptics as an outpatient -Neurology recommendations: Keppra -Seizure precautions  Diabetes mellitus, type 1, uncontrolled with hyperglycemia Hyperglycemic overnight. Hemoglobin A1C of 12.2%. Better controlled today. -Continue Lantus 30 units daily -Continue SSI  Ebstein's anomaly [facility] [place] discussion with father. Partial repair 10 years ago. Per father, plan was to have second surgery at some point soon.   DVT prophylaxis: Lovenox Code Status: Full code Family Communication: Father at bedside Disposition Plan: Home pending stroke workup   Consultants:   Neurology/Stroke  Cardiology  Procedures:   None  Antimicrobials:  None    Subjective: No issues overnight.  Objective: Vitals:   01/03/18 1722 01/03/18 2114 01/04/18 0145 01/04/18 0547  BP: 110/70 112/66 109/63 107/65  Pulse: 80 72 65 67  Resp:  18 18 18 18   Temp: 98.7 F (37.1 C) 98.8 F (37.1 C) 98.2 F (36.8 C) 97.7 F (36.5 C)  TempSrc: Oral Oral Oral Oral  SpO2: 100% 97% 95% 95%  Weight:        Intake/Output Summary (Last 24 hours) at 01/04/2018 0951 Last data filed at 01/03/2018 1522 Gross per 24 hour  Intake 784.17 ml  Output -  Net 784.17 ml   Filed Weights   01/03/18 0443  Weight: 55.8 kg (123 lb)    Examination:  General exam: Appears calm and comfortable Respiratory system: Respiratory effort normal. Cardiovascular: Regular rate and rhythm. Normal S1 and S2. 2/6 diastolic murmur. No extra heart sounds Gastroenterology: Soft, non-tender, non-distended, no guarding, no rebound, no masses felt Central nervous system: Alert and oriented. No focal deficits Psychiatry: Judgement and insight appear normal. Mood & affect appropriate.     Data Reviewed: I have personally reviewed following labs and imaging studies  CBC: Recent Labs  Lab 01/02/18 1828 01/03/18 0621  WBC 13.1* 8.0  HGB 17.3* 14.4  HCT 48.4 41.9  MCV 81.2 80.4  PLT 265 269   Basic Metabolic Panel: Recent Labs  Lab 01/02/18 1828 01/03/18 0621 01/04/18 0446  NA 139 130* 137  K 3.9 4.3 3.9  CL 102 101 109  CO2 24 17* 19*  GLUCOSE 255* 522* 204*  BUN 10 11 13   CREATININE 1.16 1.30* 1.03  CALCIUM 9.4 8.4* 8.5*   GFR: CrCl cannot be calculated (Unknown ideal weight.). Liver Function Tests: Recent Labs  Lab 01/03/18 0621  AST 72*  ALT 39  ALKPHOS 88  BILITOT 1.2  PROT 5.5*  ALBUMIN 3.1*   No results for input(s): LIPASE, AMYLASE in the last 168 hours.  No results for input(s): AMMONIA in the last 168 hours. Coagulation Profile: No results for input(s): INR, PROTIME in the last 168 hours. Cardiac Enzymes: No results for input(s): CKTOTAL, CKMB, CKMBINDEX, TROPONINI in the last 168 hours. BNP (last 3 results) No results for input(s): PROBNP in the last 8760 hours. HbA1C: Recent Labs    01/03/18 0623  HGBA1C 12.2*    CBG: Recent Labs  Lab 01/03/18 1036 01/03/18 1231 01/03/18 1636 01/03/18 2117 01/04/18 0646  GLUCAP 147* 116* 184* 233* 165*   Lipid Profile: Recent Labs    01/03/18 0623  CHOL 137  HDL 49  LDLCALC 69  TRIG 97  CHOLHDL 2.8   Thyroid Function Tests: No results for input(s): TSH, T4TOTAL, FREET4, T3FREE, THYROIDAB in the last 72 hours. Anemia Panel: No results for input(s): VITAMINB12, FOLATE, FERRITIN, TIBC, IRON, RETICCTPCT in the last 72 hours. Sepsis Labs: No results for input(s): PROCALCITON, LATICACIDVEN in the last 168 hours.  No results found for this or any previous visit (from the past 240 hour(s)).       Radiology Studies: Ct Angio Head W Or Wo Contrast  Result Date: 01/03/2018 CLINICAL DATA:  Stroke follow-up.  History of seizures and diabetes. EXAM: CT ANGIOGRAPHY HEAD AND NECK TECHNIQUE: Multidetector CT imaging of the head and neck was performed using the standard protocol during bolus administration of intravenous contrast. Multiplanar CT image reconstructions and MIPs were obtained to evaluate the vascular anatomy. Carotid stenosis measurements (when applicable) are obtained utilizing NASCET criteria, using the distal internal carotid diameter as the denominator. CONTRAST:  50mL ISOVUE-370 IOPAMIDOL (ISOVUE-370) INJECTION 76% COMPARISON:  Brain MRI from earlier today FINDINGS: CTA NECK FINDINGS Aortic arch: Changes of previous cardiopulmonary bypass. Right carotid system: Vessels are smooth and widely patent. Comparatively small to the left carotid system due to aplastic right A1 segment and left fetal type PCA. 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 Vessels are smooth and widely patent. Vertebral arteries: The left vertebral artery arises from the arch. No proximal subclavian stenosis. Codominant vertebral arteries that are both smooth and widely patent to the dura Skeleton: No acute or aggressive finding. Other neck: Negative soft  tissues. Upper chest: Large appearance of the pulmonary artery outflow tract this patient history of congenital heart disease. Review of the MIP images confirms the above findings CTA HEAD FINDINGS Anterior circulation: Small right ICA in the setting of aplastic right A1 segment and fetal type left PCA. There is no branch occlusion or beading. No flow limiting stenosis. No evidence of aneurysm or vascular malformation. Posterior circulation: Small vertebrobasilar artery due to fetal type circulation on the left. Right pica and dominant left aica. The vertebral and basilar arteries are smooth and diffusely patent. Symmetric bilateral PCA flow. Venous sinuses: Patent Anatomic variants: As above Delayed phase: No abnormal intracranial enhancement. Small remote right cerebellar infarct. Review of the MIP images confirms the above findings IMPRESSION: 1. Negative CTA of the head and neck. No occlusion, stenosis, or embolic source identified. 2. Partially visualized postoperative heart. History of congenital heart disease. Electronically Signed   By: Marnee Spring M.D.   On: 01/03/2018 07:27   Dg Chest 1 View  Result Date: 01/03/2018 CLINICAL DATA:  Leukocytosis.  History of Ebstein's anomaly. EXAM: CHEST 1 VIEW COMPARISON:  None. FINDINGS: The cardiac silhouette is mildly enlarged. Normal pulmonary vascularity. No focal consolidation, pleural effusion, or pneumothorax. No acute osseous abnormality. Prior median sternotomy with fracture of the second most superior wire. IMPRESSION: Cardiomegaly.  No active disease. Electronically Signed   By: Obie Dredge M.D.   On: 01/03/2018 07:26   Ct Head Wo Contrast  Result Date: 01/02/2018 CLINICAL DATA:  Seizure, new, nontraumatic, 18-40 years. EXAM: CT HEAD WITHOUT CONTRAST TECHNIQUE: Contiguous axial images were obtained from the base of the skull through the vertex without intravenous contrast. COMPARISON:  None. FINDINGS: Brain: A benign-appearing cyst adjacent to  the posterior horn of the left lateral ventricle measures 2.4 x 2.2 x 2.2 cm. There is focal dilation of the posterior horn of left lateral ventricle as well. No acute infarct, hemorrhage, or mass lesion is present. No migrational anomaly is evident. Temporal lobes are unremarkable. Remote lacunar infarcts are present posteriorly in the right cerebellum. Brainstem and cerebellum are otherwise normal. Vascular: No hyperdense vessel or unexpected calcification. Skull: Calvarium is intact. No focal lytic or blastic lesions are present. No significant extracranial soft tissue injury is present. Sinuses/Orbits: The paranasal sinuses and mastoid air cells are clear. Globes and orbits are within normal limits. Other: None. IMPRESSION: 1. 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 There is no definite communication with the lateral ventricle. MRI of the head without and with contrast could be used for further evaluation. 2. No acute intracranial abnormality or focal abnormality to explain seizures otherwise. Electronically Signed   By: Marin Roberts M.D.   On: 01/02/2018 19:38   Ct Angio Neck W Or Wo Contrast  Result Date: 01/03/2018 CLINICAL DATA:  Stroke follow-up.  History of seizures and diabetes. EXAM: CT ANGIOGRAPHY HEAD AND NECK TECHNIQUE: Multidetector CT imaging of the head and neck was performed using the standard protocol during bolus administration of intravenous contrast. Multiplanar CT image reconstructions and MIPs were obtained to evaluate the vascular anatomy. Carotid stenosis measurements (when applicable) are obtained utilizing NASCET criteria, using the distal internal carotid diameter as the denominator. CONTRAST:  50mL ISOVUE-370 IOPAMIDOL (ISOVUE-370) INJECTION 76% COMPARISON:  Brain MRI from earlier today FINDINGS: CTA NECK FINDINGS Aortic arch: Changes of previous cardiopulmonary bypass. Right carotid system: Vessels are smooth and widely  patent. Comparatively small to the left carotid system due to aplastic right A1 segment and left fetal type PCA. 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 Vessels are smooth and widely patent. Vertebral arteries: The left vertebral artery arises from the arch. No proximal subclavian stenosis. Codominant vertebral arteries that are both smooth and widely patent to the dura Skeleton: No acute or aggressive finding. Other neck: Negative soft tissues. Upper chest: Large appearance of the pulmonary artery outflow tract this patient history of congenital heart disease. Review of the MIP images confirms the above findings CTA HEAD FINDINGS Anterior circulation: Small right ICA in the setting of aplastic right A1 segment and fetal type left PCA. There is no branch occlusion or beading. No flow limiting stenosis. No evidence of aneurysm or vascular malformation. Posterior circulation: Small vertebrobasilar artery due to fetal type circulation on the left. Right pica and dominant left aica. The vertebral and basilar arteries are smooth and diffusely patent. Symmetric bilateral PCA flow. Venous sinuses: Patent Anatomic variants: As above Delayed phase: No abnormal intracranial enhancement. Small remote right cerebellar infarct. Review of the MIP images confirms the above findings IMPRESSION: 1. Negative CTA of the head and neck. No occlusion, stenosis, or embolic source identified. 2. Partially visualized postoperative heart. History of congenital heart disease. Electronically Signed   By: Marnee Spring M.D.   On: 01/03/2018 07:27  Mr Laqueta Jean And Wo Contrast  Result Date: 01/03/2018 CLINICAL DATA:  22 y/o M; Seizure, new, nontraumatic, 18-40 yrs. History of seizures and prior seizure therapy. EXAM: MRI HEAD WITHOUT AND WITH CONTRAST TECHNIQUE: Multiplanar, multiecho pulse sequences of the brain and surrounding structures were obtained without and with intravenous contrast. CONTRAST:  11mL  MULTIHANCE GADOBENATE DIMEGLUMINE 529 MG/ML IV SOLN COMPARISON:  01/02/2018 CT head FINDINGS: Brain: Punctate foci of reduced diffusion are present within the bilateral occipital lobes, right cerebellar hemisphere, and there are subcentimeter foci of infarction within the left frontal centrum semiovale and superior left putamen. No associated hemorrhage or mass effect. Faint T2 FLAIR signal abnormality is present associated with the largest left frontal centrum semiovale lesion. Small chronic infarctions in the right cerebellar hemisphere. There several punctate foci of susceptibility hypointensity scattered throughout the supratentorial brain and the left cerebellar hemisphere compatible with hemosiderin deposition of chronic microhemorrhage. 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 5 mm T1 hyperintense focus within the right paramedian sella poorly visualized on the gradient T1 sequence, possibly small lipoma or proteinaceous cyst. Normal size pituitary gland with midline infundibulum. Complete corpus callosum and vermis. Hippocampi are symmetric in size and signal. No disorder cortical formation, cortical dysplasia, or heterotopia identified. Vascular: Normal flow voids. Skull and upper cervical spine: Normal marrow signal. Sinuses/Orbits: Negative. Other: None. IMPRESSION: 1. Small foci of reduced diffusion are present within the left frontal white matter, left putamen, bilateral occipital lobes, and right cerebellum probably representing acute/early subacute infarctions. The pattern is atypical for seizure activity. Additionally, there are several punctate sequelae of chronic microhemorrhage throughout the brain and chronic infarcts in the right cerebellum. Given patient age, embolic phenomenon possibly with cardiac defect,  sequelae of vasculitis, traumatic brain injury, or hematologic disorder should be considered. 2. 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 3. No additional structural cause of seizure identified. These results were called by telephone at the time of interpretation on 01/03/2018 at 4:34 am to Dr. Rochele Raring , who verbally acknowledged these results. Electronically Signed   By: Mitzi Hansen M.D.   On: 01/03/2018 04:47        Scheduled Meds: .  stroke: mapping our early stages of recovery book   Does not apply Once  . aspirin  300 mg Rectal Daily   Or  . aspirin  325 mg Oral Daily  . atorvastatin  80 mg Oral q1800  . enoxaparin (LOVENOX) injection  40 mg Subcutaneous Q24H  . insulin aspart  0-9 Units Subcutaneous TID WC  . insulin glargine  30 Units Subcutaneous QHS  . levETIRAcetam  500 mg Oral BID   Continuous Infusions:    LOS: 1 day     Jacquelin Hawking, MD [facility] 01/04/2018, 9:51 AM Pager: [phone]  If 7PM-7AM, please contact night-coverage [email or web address] Password [facility] 01/04/2018, 9:51 AM

## 2018-01-04 NOTE — Progress Notes (Signed)
Progress Note  Patient Name: Manuel Baker Date of Encounter: 01/04/2018  Primary Cardiologist:  New, Nahser   Subjective   Jeffery is a 22 year old gentleman with a history of Epstein's anomaly.  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  Inpatient Medications    Scheduled Meds: .  stroke: mapping our early stages of recovery book   Does not apply Once  . aspirin  300 mg Rectal Daily   Or  . aspirin  325 mg Oral Daily  . atorvastatin  80 mg Oral q1800  . enoxaparin (LOVENOX) injection  40 mg Subcutaneous Q24H  . insulin aspart  0-9 Units Subcutaneous TID WC  . insulin glargine  30 Units Subcutaneous QHS  . levETIRAcetam  500 mg Oral BID   Continuous Infusions:  PRN Meds: acetaminophen **OR** acetaminophen (TYLENOL) oral liquid 160 mg/5 mL **OR** acetaminophen   Vital Signs    Vitals:   01/03/18 1722 01/03/18 2114 01/04/18 0145 01/04/18 0547  BP: 110/70 112/66 109/63 107/65  Pulse: 80 72 65 67  Resp: 18 18 18 18   Temp: 98.7 F (37.1 C) 98.8 F (37.1 C) 98.2 F (36.8 C) 97.7 F (36.5 C)  TempSrc: Oral Oral Oral Oral  SpO2: 100% 97% 95% 95%  Weight:        Intake/Output Summary (Last 24 hours) at 01/04/2018 0934 Last data filed at 01/03/2018 1522 Gross per 24 hour  Intake 784.17 ml  Output -  Net 784.17 ml   Filed Weights   01/03/18 0443  Weight: 123 lb (55.8 kg)    Telemetry     NSR  - Personally Reviewed  ECG     NSR  - Personally Reviewed  Physical Exam   GEN:  Gentleman, no acute distress Neck: No JVD Cardiac: RRR, 1-2 / 6 systolic murmur   Respiratory: Clear to auscultation bilaterally. GI: Soft, nontender, non-distended  MS: No edema; No deformity. Neuro:  Nonfocal  Psych: Normal affect   Labs    Chemistry Recent Labs  Lab 01/02/18 1828 01/03/18 0621 01/04/18 0446  NA 139 130* 137  K 3.9 4.3 3.9  CL 102 101 109  CO2 24 17* 19*  GLUCOSE 255* 522* 204*  BUN 10 11 13   CREATININE 1.16 1.30* 1.03  CALCIUM 9.4 8.4* 8.5*    PROT  --  5.5*  --   ALBUMIN  --  3.1*  --   AST  --  72*  --   ALT  --  39  --   ALKPHOS  --  88  --   BILITOT  --  1.2  --   GFRNONAA >60 >60 >60  GFRAA >60 >60 >60  ANIONGAP 13 12 9      Hematology Recent Labs  Lab 01/02/18 1828 01/03/18 0621  WBC 13.1* 8.0  RBC 5.96* 5.21  HGB 17.3* 14.4  HCT 48.4 41.9  MCV 81.2 80.4  MCH 29.0 27.6  MCHC 35.7 34.4  RDW 13.4 13.4  PLT 265 269    Cardiac EnzymesNo results for input(s): TROPONINI in the last 168 hours. No results for input(s): TROPIPOC in the last 168 hours.   BNPNo results for input(s): BNP, PROBNP in the last 168 hours.   DDimer No results for input(s): DDIMER in the last 168 hours.   Radiology    Ct Angio Head W Or Wo Contrast  Result Date: 01/03/2018 CLINICAL DATA:  Stroke follow-up.  History of seizures and diabetes. EXAM: CT ANGIOGRAPHY HEAD AND NECK TECHNIQUE: Multidetector CT  imaging of the head and neck was performed using the standard protocol during bolus administration of intravenous contrast. Multiplanar CT image reconstructions and MIPs were obtained to evaluate the vascular anatomy. Carotid stenosis measurements (when applicable) are obtained utilizing NASCET criteria, using the distal internal carotid diameter as the denominator. CONTRAST:  50mL ISOVUE-370 IOPAMIDOL (ISOVUE-370) INJECTION 76% COMPARISON:  Brain MRI from earlier today FINDINGS: CTA NECK FINDINGS Aortic arch: Changes of previous cardiopulmonary bypass. Right carotid system: Vessels are smooth and widely patent. Comparatively small to the left carotid system due to aplastic right A1 segment and left fetal type PCA. 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 Vessels are smooth and widely patent. Vertebral arteries: The left vertebral artery arises from the arch. No proximal subclavian stenosis. Codominant vertebral arteries that are both smooth and widely patent to the dura Skeleton: No acute or aggressive finding. Other  neck: Negative soft tissues. Upper chest: Large appearance of the pulmonary artery outflow tract this patient history of congenital heart disease. Review of the MIP images confirms the above findings CTA HEAD FINDINGS Anterior circulation: Small right ICA in the setting of aplastic right A1 segment and fetal type left PCA. There is no branch occlusion or beading. No flow limiting stenosis. No evidence of aneurysm or vascular malformation. Posterior circulation: Small vertebrobasilar artery due to fetal type circulation on the left. Right pica and dominant left aica. The vertebral and basilar arteries are smooth and diffusely patent. Symmetric bilateral PCA flow. Venous sinuses: Patent Anatomic variants: As above Delayed phase: No abnormal intracranial enhancement. Small remote right cerebellar infarct. Review of the MIP images confirms the above findings IMPRESSION: 1. Negative CTA of the head and neck. No occlusion, stenosis, or embolic source identified. 2. Partially visualized postoperative heart. History of congenital heart disease. Electronically Signed   By: Marnee SpringJonathon  Watts M.D.   On: 01/03/2018 07:27   Dg Chest 1 View  Result Date: 01/03/2018 CLINICAL DATA:  Leukocytosis.  History of Ebstein's anomaly. EXAM: CHEST 1 VIEW COMPARISON:  None. FINDINGS: The cardiac silhouette is mildly enlarged. Normal pulmonary vascularity. No focal consolidation, pleural effusion, or pneumothorax. No acute osseous abnormality. Prior median sternotomy with fracture of the second most superior wire. IMPRESSION: Cardiomegaly.  No active disease. Electronically Signed   By: Obie DredgeWilliam T Derry M.D.   On: 01/03/2018 07:26   Ct Head Wo Contrast  Result Date: 01/02/2018 CLINICAL DATA:  Seizure, new, nontraumatic, 18-40 years. EXAM: CT HEAD WITHOUT CONTRAST TECHNIQUE: Contiguous axial images were obtained from the base of the skull through the vertex without intravenous contrast. COMPARISON:  None. FINDINGS: Brain: A benign-appearing  cyst adjacent to the posterior horn of the left lateral ventricle measures 2.4 x 2.2 x 2.2 cm. There is focal dilation of the posterior horn of left lateral ventricle as well. No acute infarct, hemorrhage, or mass lesion is present. No migrational anomaly is evident. Temporal lobes are unremarkable. Remote lacunar infarcts are present posteriorly in the right cerebellum. Brainstem and cerebellum are otherwise normal. Vascular: No hyperdense vessel or unexpected calcification. Skull: Calvarium is intact. No focal lytic or blastic lesions are present. No significant extracranial soft tissue injury is present. Sinuses/Orbits: The paranasal sinuses and mastoid air cells are clear. Globes and orbits are within normal limits. Other: None. IMPRESSION: 1. 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 There is no definite communication with the lateral ventricle. MRI of the head without and with contrast could be used for further evaluation.  2. No acute intracranial abnormality or focal abnormality to explain seizures otherwise. Electronically Signed   By: Marin Roberts M.D.   On: 01/02/2018 19:38   Ct Angio Neck W Or Wo Contrast  Result Date: 01/03/2018 CLINICAL DATA:  Stroke follow-up.  History of seizures and diabetes. EXAM: CT ANGIOGRAPHY HEAD AND NECK TECHNIQUE: Multidetector CT imaging of the head and neck was performed using the standard protocol during bolus administration of intravenous contrast. Multiplanar CT image reconstructions and MIPs were obtained to evaluate the vascular anatomy. Carotid stenosis measurements (when applicable) are obtained utilizing NASCET criteria, using the distal internal carotid diameter as the denominator. CONTRAST:  50mL ISOVUE-370 IOPAMIDOL (ISOVUE-370) INJECTION 76% COMPARISON:  Brain MRI from earlier today FINDINGS: CTA NECK FINDINGS Aortic arch: Changes of previous cardiopulmonary bypass. Right carotid system: Vessels are smooth  and widely patent. Comparatively small to the left carotid system due to aplastic right A1 segment and left fetal type PCA. 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 Vessels are smooth and widely patent. Vertebral arteries: The left vertebral artery arises from the arch. No proximal subclavian stenosis. Codominant vertebral arteries that are both smooth and widely patent to the dura Skeleton: No acute or aggressive finding. Other neck: Negative soft tissues. Upper chest: Large appearance of the pulmonary artery outflow tract this patient history of congenital heart disease. Review of the MIP images confirms the above findings CTA HEAD FINDINGS Anterior circulation: Small right ICA in the setting of aplastic right A1 segment and fetal type left PCA. There is no branch occlusion or beading. No flow limiting stenosis. No evidence of aneurysm or vascular malformation. Posterior circulation: Small vertebrobasilar artery due to fetal type circulation on the left. Right pica and dominant left aica. The vertebral and basilar arteries are smooth and diffusely patent. Symmetric bilateral PCA flow. Venous sinuses: Patent Anatomic variants: As above Delayed phase: No abnormal intracranial enhancement. Small remote right cerebellar infarct. Review of the MIP images confirms the above findings IMPRESSION: 1. Negative CTA of the head and neck. No occlusion, stenosis, or embolic source identified. 2. Partially visualized postoperative heart. History of congenital heart disease. Electronically Signed   By: Marnee Spring M.D.   On: 01/03/2018 07:27   Mr Laqueta Jean And Wo Contrast  Result Date: 01/03/2018 CLINICAL DATA:  22 y/o M; Seizure, new, nontraumatic, 18-40 yrs. History of seizures and prior seizure therapy. EXAM: MRI HEAD WITHOUT AND WITH CONTRAST TECHNIQUE: Multiplanar, multiecho pulse sequences of the brain and surrounding structures were obtained without and with intravenous contrast. CONTRAST:   11mL MULTIHANCE GADOBENATE DIMEGLUMINE 529 MG/ML IV SOLN COMPARISON:  01/02/2018 CT head FINDINGS: Brain: Punctate foci of reduced diffusion are present within the bilateral occipital lobes, right cerebellar hemisphere, and there are subcentimeter foci of infarction within the left frontal centrum semiovale and superior left putamen. No associated hemorrhage or mass effect. Faint T2 FLAIR signal abnormality is present associated with the largest left frontal centrum semiovale lesion. Small chronic infarctions in the right cerebellar hemisphere. There several punctate foci of susceptibility hypointensity scattered throughout the supratentorial brain and the left cerebellar hemisphere compatible with hemosiderin deposition of chronic microhemorrhage. 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 5 mm T1 hyperintense focus within the right paramedian sella poorly visualized on the gradient T1 sequence, possibly small lipoma or proteinaceous  cyst. Normal size pituitary gland with midline infundibulum. Complete corpus callosum and vermis. Hippocampi are symmetric in size and signal. No disorder cortical formation, cortical dysplasia, or heterotopia identified. Vascular: Normal flow voids. Skull and upper cervical spine: Normal marrow signal. Sinuses/Orbits: Negative. Other: None. IMPRESSION: 1. Small foci of reduced diffusion are present within the left frontal white matter, left putamen, bilateral occipital lobes, and right cerebellum probably representing acute/early subacute infarctions. The pattern is atypical for seizure activity. Additionally, there are several punctate sequelae of chronic microhemorrhage throughout the brain and chronic infarcts in the right cerebellum. Given patient age, embolic phenomenon possibly with cardiac defect,  sequelae of vasculitis, traumatic brain injury, or hematologic disorder should be considered. 2. 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 3. No additional structural cause of seizure identified. These results were called by telephone at the time of interpretation on 01/03/2018 at 4:34 am to Dr. Rochele Raring , who verbally acknowledged these results. Electronically Signed   By: Mitzi Hansen M.D.   On: 01/03/2018 04:47    Cardiac Studies   Prelim echo results Large RV with significant RV dysfunction There is a persistent ASD   Patient Profile     22 y.o. male   Assessment & Plan    1.  History of Epstein's anomaly: The patient is status post episodes of repair at age 26.  He was told at that time that he would need a second operation at some point in his life.  Preliminary views of the echocardiogram revealed a markedly enlarged RV with significant RV dysfunction.  He has a persistent ASD.  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  I suspect that he will need long-term anticoagulation.  Long-term, I think that he needs to be referred to provide local universities.  I suggest that we set him up to go to [facility] to the adult congenital heart disease program. [facility] [place] [facility] and that would also be an option since his parents live down there.  He is currently a Consulting civil engineer at [employer] and following up in [place], [place] would likely be difficult.      For questions or updates, please contact [facility] Please consult [email or web address] for contact info under Cardiology/STEMI.      Signed, Kristeen Miss, MD  01/04/2018, 9:34 AM

## 2018-01-04 NOTE — Progress Notes (Signed)
*  PRELIMINARY RESULTS* Echocardiogram 2D Echocardiogram has been performed.  Manuel Baker, Manuel Baker 01/04/2018, 9:14 AM

## 2018-01-04 NOTE — Progress Notes (Signed)
OT Cancellation Note  Patient Details Name: Manuel Baker MRN: [id number] DOB: 12/27/1995   Cancelled Treatment:    Reason Eval/Treat Not Completed: OT screened, no needs identified, will sign off. Per chart review, PT eval and conversation with pt, pt appears at baseline with ADLs. Will sign off for now. Should any OT needs arise please reorder OT eval.  Raynald KempKathryn Blessing Zaucha OTR/L Pager: [phone]  01/04/2018, 2:05 PM

## 2018-01-04 NOTE — Progress Notes (Signed)
STROKE TEAM PROGRESS NOTE   HISTORY OF PRESENT ILLNESS (per record) Manuel Baker is a 22 y.o. 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  He said that he woke up early yesterday morning with his body feeling sore all over.  Upon waking up he also found bite marks on his tongue on both sides.  He sought help at the [facility] from where he was referred to the emergency room. He said that he probably had seizures as a child in middle school and was on medication for it up until the end of ninth grade.  He has not had any seizures since then.  He is unable to tell me what antiepileptic he was on at the time. He had been seen by a neurologist in [place] and EEG and MRIs have been done in the past with no results available for review. Currently, he denies any headaches, focal tingling numbness or weakness.  Denies any headaches.  Denies nausea vomiting. Does not report any preceding illnesses, fevers or chills prior to presentation. Denies any chest pain shortness of breath or palpitations. He denies any illicit drug use other than using marijuana.  Does not use tobacco or alcohol.  Denies any family history of strokes in young.  Denies family history of seizures. He does not know of any family member with a history of sickle cell.   LKW: 8 AM on 01/02/2018 tpa given?: no, outside the window Premorbid modified Rankin scale (mRS): 0     SUBJECTIVE (INTERVAL HISTORY) No family present. Pt drowsy but arouses easily. 2-D echo preilim read shows enlarged right ventricle and persistent ASD. Lower extremity venous Dopplers are pending. Case discussed with Dr.Nahser and agree patient will need referral to [facility] as an  outpatient. No recurrent seizures   OBJECTIVE Temp:  [97.7 F (36.5 C)-98.8 F (37.1 C)] 97.7 F (36.5 C) (02/10 0547) Pulse Rate:  [65-86] 67 (02/10 0547) Cardiac Rhythm: Bundle branch block;Heart block (02/10 0700) Resp:  [14-20] 18 (02/10 0547) BP: (104-112)/(62-77) 107/65 (02/10 0547) SpO2:  [95 %-100 %] 95 % (02/10 0547)  CBC:  Recent Labs  Lab 01/02/18 1828 01/03/18 0621  WBC 13.1* 8.0  HGB 17.3* 14.4  HCT 48.4 41.9  MCV 81.2 80.4  PLT 265 269    Basic Metabolic Panel:  Recent Labs  Lab 01/03/18 0621 01/04/18 0446  NA 130* 137  K 4.3 3.9  CL 101 109  CO2 17* 19*  GLUCOSE 522* 204*  BUN 11 13  CREATININE 1.30* 1.03  CALCIUM 8.4* 8.5*    Lipid Panel:     Component Value Date/Time   CHOL 137 01/03/2018 0623   TRIG 97 01/03/2018 0623   HDL 49 01/03/2018 0623   CHOLHDL 2.8 01/03/2018 0623   VLDL 19 01/03/2018 0623   LDLCALC 69 01/03/2018 0623   HgbA1c:  Lab Results  Component Value Date   HGBA1C 12.2 (H) 01/03/2018   Urine Drug Screen:     Component Value Date/Time   LABOPIA NONE DETECTED 01/03/2018 0116   COCAINSCRNUR NONE DETECTED 01/03/2018 0116   LABBENZ NONE DETECTED 01/03/2018 0116   AMPHETMU NONE DETECTED 01/03/2018 0116   THCU NONE DETECTED 01/03/2018 0116   LABBARB NONE DETECTED 01/03/2018 0116  Alcohol Level No results found for: ETH  IMAGING   Ct Angio Head W Or Wo Contrast Ct Angio Neck W Or Wo Contrast 01/03/2018 IMPRESSION:  1. Negative CTA of the head and neck. No occlusion, stenosis, or embolic source identified.  2. Partially visualized postoperative heart. History of congenital heart disease.    Dg Chest 1 View 01/03/2018 IMPRESSION:  Cardiomegaly.  No active disease.      Ct Head Wo Contrast 01/02/2018 IMPRESSION:  1. 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 There is no definite communication with the lateral ventricle. MRI of the head without and with contrast could  be used for further evaluation.  2. No acute intracranial abnormality or focal abnormality to explain seizures otherwise.     Mr Laqueta Jean And Wo Contrast 01/03/2018 IMPRESSION:  1. Small foci of reduced diffusion are present within the left frontal white matter, left putamen, bilateral occipital lobes, and right cerebellum probably representing acute/early subacute infarctions. The pattern is atypical for seizure activity. Additionally, there are several punctate sequelae of chronic microhemorrhage throughout the brain and chronic infarcts in the right cerebellum. Given patient age, embolic phenomenon possibly with cardiac defect, sequelae of vasculitis, traumatic brain injury, or hematologic disorder should be considered.  2. 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  3. No additional structural cause of seizure identified.     Transthoracic Echocardiogram - pending 00/00/00  Bilateral Lower Extremity Dopplers - pending   Transcranial Doppler Bubble study - pending   PHYSICAL EXAM Vitals:   01/03/18 1722 01/03/18 2114 01/04/18 0145 01/04/18 0547  BP: 110/70 112/66 109/63 107/65  Pulse: 80 72 65 67  Resp: 18 18 18 18   Temp: 98.7 F (37.1 C) 98.8 F (37.1 C) 98.2 F (36.8 C) 97.7 F (36.5 C)  TempSrc: Oral Oral Oral Oral  SpO2: 100% 97% 95% 95%  Weight:        Pleasant young african [place] male not in distress. . Afebrile. Head is nontraumatic. Neck is supple without bruit.    Cardiac exam no murmur or gallop. Lungs are clear to auscultation. Distal pulses are well felt.midline chest surgical scar. Bite marks on lateral aspect of tongue Neurological Exam ;  Awake  Alert oriented x 3. Normal speech and language.eye movements full without nystagmus.fundi were not visualized. Vision acuity and fields appear normal. Hearing is normal. Palatal movements are normal. Face symmetric. Tongue midline. Normal strength, tone, reflexes  and coordination. Normal sensation. Gait deferred.      HOME MEDICATIONS:  Medications Prior to Admission  Medication Sig Dispense Refill  . insulin glargine (LANTUS) 100 UNIT/ML injection Inject 30 Units into the skin at bedtime.    . insulin lispro (HUMALOG) 100 UNIT/ML injection Inject 10-30 Units into the skin 3 (three) times daily before meals. Sliding scale        HOSPITAL MEDICATIONS:  .  stroke: mapping our early stages of recovery book   Does not apply Once  . aspirin  300 mg Rectal Daily   Or  . aspirin  325 mg Oral Daily  . atorvastatin  80 mg Oral q1800  . enoxaparin (LOVENOX) injection  40 mg Subcutaneous Q24H  . insulin aspart  0-9 Units Subcutaneous TID WC  . insulin glargine  30 Units Subcutaneous QHS  . levETIRAcetam  500 mg Oral BID    ALLERGIES No Known Allergies  PAST MEDICAL HISTORY Past Medical History:  Diagnosis Date  .  Congenital heart disease   . Diabetes mellitus without complication (HCC)   . Seizures (HCC)     SURGICAL HISTORY Past Surgical History:  Procedure Laterality Date  . open heart surgery      FAMILY HISTORY Family History  Problem Relation Age of Onset  . Diabetes Mellitus I Sister      ASSESSMENT/PLAN Mr. Suanne MarkerCedric Semrad is a 22 y.o. male with history of Epsteins anomaly S/P surgical repair age 89, chronic headaches, seizure history, and diabetes, presenting with possible seizure activity. He did not receive IV t-PA due to late presentation.  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  MRI head - Small foci of reduced diffusion in the left frontal white matter, left putamen, bilateral occipital lobes, and right cerebellum probably representing acute/early subacute infarctions.  MRA head - not performed  EEG - pending   CTA H&N - negative  Transcranial Dopplers -  pending  LE Dopplers - pending  Carotid Doppler - CTA neck  2D Echo - pending  LDL - 69  HgbA1c - 12.2  VTE prophylaxis - Lovenox Seizure precautions Diet heart healthy/carb modified Room service appropriate? Yes; Fluid consistency: Thin Fall precautions  No antithrombotic prior to admission, now on aspirin 325 mg daily  Patient counseled to be compliant with his antithrombotic medications  Ongoing aggressive stroke risk factor management  Therapy recommendations:  pending  Disposition:  Pending  Hypertension  Blood pressure runs mildly low  Permissive hypertension (OK if < 220/120) but gradually normalize in 5-7 days  Long-term BP goal normotensive  Hyperlipidemia  Home meds:  No lipid lowering medications prior to admission  LDL 69, goal < 70  Now on Lipitor 80 mg daily  Continue statin at discharge   Diabetes  HgbA1c 12.2, goal < 7.0  Uncontrolled  Other Stroke Risk Factors  ETOH use, advised to drink no more than 1 drink per day.  Congenital heart defect   Other Active Problems  Epsteins anomaly - cardiology consult -> TEE with bubble study and cardiac MRI recommended.  Seizure history - now on Keppra - EEG pending  Na - 130  Creatinine - 1.30   Plan / Recommendations  Check LE venous dopplers for DVT and if positive will need anticoagulation. Karolee Ohslse continue aspirin TEE planned for tomorrow Outpatient referral to [facility]  Hospital day # 1    I have personally examined this patient, reviewed notes, independently viewed imaging studies, participated in medical decision making and plan of care.ROS completed by me personally and pertinent positives fully documented  I have made any additions or clarifications directly to the above note. Agree with note above.  He presented with a generalized seizure and MRI shows multiple tiny embolic infarcts etiology likely embolic and given his remote history of congenital heart  disease and surgery at age 89 possibilities include right-to-left intracardiac shunt versus endocarditis.  Recommend lower extremity venous Dopplers and transesophageal echocardiogram with bubble study.  Cardiology following. D/w Dr Elease HashimotoNahser .  Aspirin for now.  Continue Keppra for seizures.  Long discussion with the patient and Dr. Caleb PoppNettey and answered questions.  Greater than 50% time during this 25-minute visit was spent on counseling and coordination of care about his seizures and strokes and congenital heart disease and answering questions.  Delia HeadyPramod Sethi, MD Medical Director [facility] Pager: [phone] 01/04/2018 11:58  AM  To contact Stroke Continuity provider, please refer to [email or web address]. After hours, contact General Neurology

## 2018-01-04 NOTE — Plan of Care (Signed)
  Education: Knowledge of General Education information will improve 01/04/2018 0110 - Progressing by Olena Materobinson, Jettson Crable G, RN Note POC reviewed with pt./father.

## 2018-01-05 ENCOUNTER — Inpatient Hospital Stay (HOSPITAL_COMMUNITY): Payer: Self-pay

## 2018-01-05 ENCOUNTER — Inpatient Hospital Stay (HOSPITAL_COMMUNITY): Payer: BLUE CROSS/BLUE SHIELD

## 2018-01-05 DIAGNOSIS — R569 Unspecified convulsions: Secondary | ICD-10-CM

## 2018-01-05 LAB — GLUCOSE, CAPILLARY
GLUCOSE-CAPILLARY: 258 mg/dL — AB (ref 65–99)
GLUCOSE-CAPILLARY: 205 mg/dL — AB (ref 65–99)
Glucose-Capillary: 277 mg/dL — ABNORMAL HIGH (ref 65–99)
Glucose-Capillary: 340 mg/dL — ABNORMAL HIGH (ref 65–99)

## 2018-01-05 LAB — LUPUS ANTICOAGULANT PANEL
DRVVT: 29.9 s (ref 0.0–47.0)
PTT LA: 30.8 s (ref 0.0–51.9)

## 2018-01-05 LAB — PROTEIN S ACTIVITY: PROTEIN S ACTIVITY: 74 % (ref 63–140)

## 2018-01-05 LAB — PROTEIN S, TOTAL: PROTEIN S AG TOTAL: 73 % (ref 60–150)

## 2018-01-05 LAB — SICKLE CELL SCREEN: Sickle Cell Screen: POSITIVE — AB

## 2018-01-05 LAB — PROTEIN C ACTIVITY: PROTEIN C ACTIVITY: 84 % (ref 73–180)

## 2018-01-05 LAB — HOMOCYSTEINE: Homocysteine: 18.8 umol/L — ABNORMAL HIGH (ref 0.0–15.0)

## 2018-01-05 MED ORDER — ATORVASTATIN CALCIUM 10 MG PO TABS
20.0000 mg | ORAL_TABLET | Freq: Every day | ORAL | Status: DC
  Administered 2018-01-05 – 2018-01-08 (×4): 20 mg via ORAL
  Filled 2018-01-05 (×4): qty 2

## 2018-01-05 MED ORDER — ASPIRIN EC 325 MG PO TBEC
325.0000 mg | DELAYED_RELEASE_TABLET | Freq: Every day | ORAL | Status: DC
  Administered 2018-01-06: 325 mg via ORAL
  Filled 2018-01-05: qty 1

## 2018-01-05 NOTE — Progress Notes (Signed)
Progress Note  Patient Name: Manuel MarkerCedric Baker Date of Encounter: 01/05/2018  Primary Cardiologist: No primary care provider on file.   Subjective   Sleepy. Otherwise no complaints.   Inpatient Medications    Scheduled Meds: .  stroke: mapping our early stages of recovery book   Does not apply Once  . aspirin  300 mg Rectal Daily   Or  . aspirin  325 mg Oral Daily  . atorvastatin  80 mg Oral q1800  . enoxaparin (LOVENOX) injection  40 mg Subcutaneous Q24H  . insulin aspart  0-9 Units Subcutaneous TID WC  . insulin glargine  30 Units Subcutaneous QHS  . levETIRAcetam  500 mg Oral BID   Continuous Infusions:  PRN Meds: acetaminophen **OR** acetaminophen (TYLENOL) oral liquid 160 mg/5 mL **OR** acetaminophen   Vital Signs    Vitals:   01/04/18 1819 01/04/18 2123 01/05/18 0159 01/05/18 0604  BP: 132/75 111/60 115/68 105/64  Pulse: 73 77 71 70  Resp: 19 18 18 18   Temp: 97.7 F (36.5 C) 98.3 F (36.8 C) 98.2 F (36.8 C) 98.1 F (36.7 C)  TempSrc: Oral Oral Oral Oral  SpO2: 96% 96% 97% 97%  Weight:       No intake or output data in the 24 hours ending 01/05/18 0956 Filed Weights   01/03/18 0443  Weight: 123 lb (55.8 kg)    Telemetry    Sinus rhythm.  PVCs.  No events - Personally Reviewed  ECG   Sinus rhythm. Rate 72 bpm.  First degree AV block.  RBBB.  Prior septal infarct.     - Personally Reviewed  Physical Exam   VS:  BP 105/64 (BP Location: Right Arm)   Pulse 70   Temp 98.1 F (36.7 C) (Oral)   Resp 18   Wt 123 lb (55.8 kg)   SpO2 97%  , BMI There is no height or weight on file to calculate BMI. GENERAL:  Well appearing.  No acute distress HEENT: Pupils equal round and reactive, fundi not visualized, oral mucosa unremarkable NECK:  + jugular venous distention, waveform within normal limits, carotid upstroke brisk and symmetric, no bruits, no thyromegaly LYMPHATICS:  No cervical adenopathy LUNGS:  Clear to auscultation bilaterally HEART:  RRR.  PMI  not displaced or sustained,S1 and S2 within normal limits, no S3, no S4, no clicks, no rubs, no murmurs ABD:  Flat, positive bowel sounds normal in frequency in pitch, no bruits, no rebound, no guarding, no midline pulsatile mass, no hepatomegaly, no splenomegaly EXT:  2 plus pulses throughout, no edema, no cyanosis no clubbing SKIN:  No rashes no nodules NEURO:  Cranial nerves II through XII grossly intact, motor grossly intact throughout [facility]:  Cognitively intact, oriented to person place and time   Labs    Chemistry Recent Labs  Lab 01/02/18 1828 01/03/18 0621 01/04/18 0446  NA 139 130* 137  K 3.9 4.3 3.9  CL 102 101 109  CO2 24 17* 19*  GLUCOSE 255* 522* 204*  BUN 10 11 13   CREATININE 1.16 1.30* 1.03  CALCIUM 9.4 8.4* 8.5*  PROT  --  5.5*  --   ALBUMIN  --  3.1*  --   AST  --  72*  --   ALT  --  39  --   ALKPHOS  --  88  --   BILITOT  --  1.2  --   GFRNONAA >60 >60 >60  GFRAA >60 >60 >60  ANIONGAP 13 12 9  Hematology Recent Labs  Lab 01/02/18 1828 01/03/18 0621  WBC 13.1* 8.0  RBC 5.96* 5.21  HGB 17.3* 14.4  HCT 48.4 41.9  MCV 81.2 80.4  MCH 29.0 27.6  MCHC 35.7 34.4  RDW 13.4 13.4  PLT 265 269    Cardiac EnzymesNo results for input(s): TROPONINI in the last 168 hours. No results for input(s): TROPIPOC in the last 168 hours.   BNPNo results for input(s): BNP, PROBNP in the last 168 hours.   DDimer No results for input(s): DDIMER in the last 168 hours.   Radiology    No results found.  Cardiac Studies   Echo 01/04/18: Study Conclusions  - Left ventricle: The cavity size was normal. Wall thickness was   normal. Systolic function was normal. The estimated ejection   fraction was in the range of 50% to 55%. 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 Interestingly the septal insertion    is currently not apically displaced Cannot r/o small residual   ASD. - Right atrium: The atrium was severely dilated. - Atrial septum: A septal defect cannot be excluded. - Tricuspid valve: There was severe regurgitation.  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  Assessment & Plan   # Ebstein's anomaly:  Mr. Tomaso is euvolemic.  I personally reviewed his echocardiogram.  There appears to be tethering of the septal leaflet of the tricuspid valve.  There is also prolapse of the anterior leaflet.  These leaflets appear to be attached, possibly surgically.  He also has severe right ventricular systolic dysfunction.  His right ventricle was severely dilated and there is diastolic flattening of the interventricular septum, consistent with right ventricular volume overload.  Agree that he needs to be seen in adult congenital clinic for his failing RV.  There is no evidence of pre-exitation on his EKG.   # Embolic strokes: TEE planned for tomorrow to evaluate for ASD.  Would recommend an implantable loop recorder if negative and LE Dopplers are negative given that Ebstein's increases the risk of atrial fibrillation.       For questions or updates, please contact [facility] Please consult [email or web address] for contact info under Cardiology/STEMI.      Signed, Chilton Si, MD  01/05/2018, 9:56 AM

## 2018-01-05 NOTE — Plan of Care (Signed)
  Education: Knowledge of General Education information will improve 01/05/2018 0626 - Progressing by Azzie RoupEdoh, Farheen Pfahler I, RN   Clinical Measurements: Ability to maintain clinical measurements within normal limits will improve 01/05/2018 0626 - Progressing by Ashley RoyaltyEdoh, Ameris Akamine I, RN   Clinical Measurements: Diagnostic test results will improve 01/05/2018 0626 - Progressing by Ashley RoyaltyEdoh, Gearline Spilman I, RN

## 2018-01-05 NOTE — Progress Notes (Addendum)
Inpatient Diabetes Program Recommendations  AACE/ADA: New Consensus Statement on Inpatient Glycemic Control (2015)  Target Ranges:  Prepandial:   less than 140 mg/dL      Peak postprandial:   less than 180 mg/dL (1-2 hours)      Critically ill patients:  140 - 180 mg/dL   Results for NIMESH, RIOLO (MRN [id number]) as of 01/05/2018 12:05  Ref. Range 01/04/2018 06:46 01/04/2018 11:31 01/04/2018 16:51 01/04/2018 21:21  Glucose-Capillary Latest Ref Range: 65 - 99 mg/dL 165 (H)  2 units Novolog 177 (H)  2 units Novolog 276 (H)  5 units Novolog 319 (H)  30 units Lantus   Results for KAGE, WILLMANN (MRN [id number]) as of 01/05/2018 12:05  Ref. Range 01/05/2018 06:10 01/05/2018 11:48  Glucose-Capillary Latest Ref Range: 65 - 99 mg/dL 258 (H)  3 units Novolog 205 (H)  3 units Novolog   Results for FARDEEN, STEINBERGER (MRN [id number]) as of 01/05/2018 12:05  Ref. Range 01/03/2018 06:23  Hemoglobin A1C Latest Ref Range: 4.8 - 5.6 % 12.2 (H)    Admit with: Acute CVA/ Seizures/ Hyperglycemia  History: Type 1 DM, Ebstein's anomaly  Home DM Meds: Lantus 30 units QHS       Humalog 10-30 units TID  Current Insulin Orders: Lantus 30 units QHS      Novolog Sensitive Correction Scale/ SSI (0-9 units) TID AC          MD- Please consider the following in-hospital insulin adjustments:  1. Increase Lantus slightly to 32 units QHS  2. Start Novolog Meal Coverage: Novolog 4 units TID with meals (hold if pt eats <50% of meal)  3. Please give pt Rxs for Lantus and Humalog insulin pens at time of d/c.  Pt will also need Insulin pen needles: Lantus pen- Order # [id number] Humalog pen- Order # [id number] Insulin pen needles- Order # [id number]      Addendum 1:30pm- Met with pt today.  Diagnosed with Type 1 Diabetes at age 22.  Has not been checking CBGs regularly nor taking insulin as he should.  Pt is a Ship broker at [employer] and states he knows he needs to do better with his CBG control.  Was seeing  Endocrinologist in [place].  Has not been seeing MD here in [place].  Supposed to be taking Humalog 10 units with each meal + Humalog 2 units for every 50 mg/dl >150 mg/dl.  States he was told to take 10 units with meals to make his insulin regimen easier but hasn't been taking his insulin on a regular basis.  Remembers to take Lantus but states he gets too busy to stop and check CBGs and take insulin while on campus at classes.  Spoke with patient about his current A1c of 12.2%.  Explained what an A1c is and what it measures.  Reminded patient that his goal A1c is 7% or less per ADA standards to prevent both acute and long-term complications.  Explained to patient the extreme importance of good glucose control at home.  Encouraged patient to check his CBGs at least TID AC at home and to record all CBGs in a logbook for his PCP or Endocrinologist to review.  Discussed with pt the extreme importance of taking insulin and checking CBGs especially at his young age.  Pt again stated to me that he knows he needs to do better.  Pt told me he used to do a Carb count with food on a 1 unit to 12 grams Carbs basis.  Discussed with  pt that taking a large amount of insulin (like set dose of 10 units) when he doesn't eat enough for this amount of insulin can be dangerous.  Pt states he knows how to Carb count.  Discussed with pt that carb counting is often safer over taking set doses of food coverage.  Encouraged pt to get set up with Endocrinologist here in [place] for after discharge.    Has CBG meter at home but will need Insulin and insulin pen needles at time of d/c until get set up with ENDO in town.      --Will follow patient during hospitalization--  Wyn Quaker RN, MSN, CDE Diabetes Coordinator Inpatient Glycemic Control Team Team Pager: [phone] (8a-5p)

## 2018-01-05 NOTE — Progress Notes (Signed)
STROKE TEAM PROGRESS NOTE   SUBJECTIVE (INTERVAL HISTORY) No family present. Pt is lying in bed, awake alert, moving all extremities and no complains. No acute event overnight. Pending TEE tomorrow.   OBJECTIVE Temp:  [97.7 F (36.5 C)-98.3 F (36.8 C)] 98 F (36.7 C) (02/11 1053) Pulse Rate:  [70-77] 72 (02/11 1053) Cardiac Rhythm: Normal sinus rhythm;Bundle branch block;Heart block;Other (Comment) (02/11 0800) Resp:  [18-20] 18 (02/11 1053) BP: (105-132)/(60-75) 119/63 (02/11 1053) SpO2:  [95 %-98 %] 95 % (02/11 1053)  CBC:  Recent Labs  Lab 01/02/18 1828 01/03/18 0621  WBC 13.1* 8.0  HGB 17.3* 14.4  HCT 48.4 41.9  MCV 81.2 80.4  PLT 265 269    Basic Metabolic Panel:  Recent Labs  Lab 01/03/18 0621 01/04/18 0446  NA 130* 137  K 4.3 3.9  CL 101 109  CO2 17* 19*  GLUCOSE 522* 204*  BUN 11 13  CREATININE 1.30* 1.03  CALCIUM 8.4* 8.5*    Lipid Panel:     Component Value Date/Time   CHOL 137 01/03/2018 0623   TRIG 97 01/03/2018 0623   HDL 49 01/03/2018 0623   CHOLHDL 2.8 01/03/2018 0623   VLDL 19 01/03/2018 0623   LDLCALC 69 01/03/2018 0623   HgbA1c:  Lab Results  Component Value Date   HGBA1C 12.2 (H) 01/03/2018   Urine Drug Screen:     Component Value Date/Time   LABOPIA NONE DETECTED 01/03/2018 0116   COCAINSCRNUR NONE DETECTED 01/03/2018 0116   LABBENZ NONE DETECTED 01/03/2018 0116   AMPHETMU NONE DETECTED 01/03/2018 0116   THCU NONE DETECTED 01/03/2018 0116   LABBARB NONE DETECTED 01/03/2018 0116    Alcohol Level No results found for: ETH  IMAGING I have personally reviewed the radiological images below and agree with the radiology interpretations.  Ct Angio Head W Or Wo Contrast Ct Angio Neck W Or Wo Contrast 01/03/2018 IMPRESSION:  1. Negative CTA of the head and neck. No occlusion, stenosis, or embolic source identified.  2. Partially visualized postoperative heart. History of congenital heart disease.   Ct Head Wo  Contrast 01/02/2018 IMPRESSION:  1. 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 There is no definite communication with the lateral ventricle. MRI of the head without and with contrast could be used for further evaluation.  2. No acute intracranial abnormality or focal abnormality to explain seizures otherwise.   Mr Laqueta Jean And Wo Contrast 01/03/2018 IMPRESSION:  1. Small foci of reduced diffusion are present within the left frontal white matter, left putamen, bilateral occipital lobes, and right cerebellum probably representing acute/early subacute infarctions. The pattern is atypical for seizure activity. Additionally, there are several punctate sequelae of chronic microhemorrhage throughout the brain and chronic infarcts in the right cerebellum. Given patient age, embolic phenomenon possibly with cardiac defect, sequelae of vasculitis, traumatic brain injury, or hematologic disorder should be considered.  2. 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  3. No additional structural cause of seizure identified.   EEG normal EEG  Transthoracic Echocardiogram - Left ventricle: The cavity size was normal. Wall thickness was   normal. Systolic function was normal. The estimated ejection   fraction was in the range of 50% to 55%. 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 Interestingly the  septal insertion   is currently not apically displaced Cannot r/o small residual   ASD. - Right atrium: The atrium was severely dilated. - Atrial septum: A septal defect cannot be excluded. - Tricuspid valve: There was severe regurgitation.  Bilateral Lower Extremity Dopplers - no DVT bilaterally  TEE pending   PHYSICAL EXAM Vitals:   01/04/18 2123  01/05/18 0159 01/05/18 0604 01/05/18 1053  BP: 111/60 115/68 105/64 119/63  Pulse: 77 71 70 72  Resp: 18 18 18 18   Temp: 98.3 F (36.8 C) 98.2 F (36.8 C) 98.1 F (36.7 C) 98 F (36.7 C)  TempSrc: Oral Oral Oral Oral  SpO2: 96% 97% 97% 95%  Weight:        Pleasant young african [place] male not in distress. . Afebrile. Head is nontraumatic. Neck is supple without bruit.    Cardiac exam no murmur or gallop. Lungs are clear to auscultation. Distal pulses are well felt.midline chest surgical scar. Bite marks on lateral aspect of tongue Neurological Exam ;  Awake  Alert oriented x 3. Normal speech and language.eye movements full without nystagmus.fundi were not visualized. Vision acuity and fields appear normal. Hearing is normal. Palatal movements are normal. Face symmetric. Tongue midline. Normal strength, tone, reflexes and coordination. Normal sensation. Gait deferred.   HOME MEDICATIONS:  Medications Prior to Admission  Medication Sig Dispense Refill  . insulin glargine (LANTUS) 100 UNIT/ML injection Inject 30 Units into the skin at bedtime.    . insulin lispro (HUMALOG) 100 UNIT/ML injection Inject 10-30 Units into the skin 3 (three) times daily before meals. Sliding scale        HOSPITAL MEDICATIONS:  .  stroke: mapping our early stages of recovery book   Does not apply Once  . aspirin  300 mg Rectal Daily   Or  . aspirin  325 mg Oral Daily  . atorvastatin  80 mg Oral q1800  . enoxaparin (LOVENOX) injection  40 mg Subcutaneous Q24H  . insulin aspart  0-9 Units Subcutaneous TID WC  . insulin glargine  30 Units Subcutaneous QHS  . levETIRAcetam  500 mg Oral BID    ALLERGIES No Known Allergies  PAST MEDICAL HISTORY Past Medical History:  Diagnosis Date  . Congenital heart disease   . Diabetes mellitus without complication (HCC)   . Seizures (HCC)     SURGICAL HISTORY Past Surgical History:  Procedure Laterality Date  . open heart surgery      FAMILY  HISTORY Family History  Problem Relation Age of Onset  . Diabetes Mellitus I Sister      ASSESSMENT/PLAN Manuel Baker is a 22 y.o. male with history of Epsteins anomaly S/P surgical repair age 77, chronic headaches, seizure history, and diabetes, presenting with possible seizure activity. He did not receive IV t-PA due to late presentation.  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  MRI head - Small foci of reduced diffusion in the left frontal white matter, left putamen, bilateral occipital lobes, and right cerebellum probably representing acute/early subacute infarctions.  CTA head and neck - unremarkable  EEG - normal   LE Dopplers - no DVT  2D Echo - EF 50-55%, severely enlarged RA and TV morphologically abnormal with some tethering and severe residual TR.  LDL - 69  HgbA1c - 12.2  VTE prophylaxis - Lovenox Diet heart healthy/carb modified Room service appropriate? Yes; Fluid consistency: Thin Diet NPO time specified  No antithrombotic prior to admission, now on aspirin 325 mg daily  Patient counseled to be compliant with his antithrombotic medications  Ongoing aggressive stroke risk factor management  Therapy recommendations:  pending  Disposition:  Pending  Ebsteins anomaly s/p repair   Done at age of 89  No regular cardiology follow up anymore  Cardiology on board  TTE showed severely enlarged RA  TEE pending  Seizure  Current presentation resembles seizure  Hx of seizure in young  Now on keppra - continue  EEG normal  Hypertension  Blood pressure runs mildly low  Permissive hypertension (OK if < 220/120) but gradually normalize in 5-7 days  Long-term BP goal normotensive  Hyperlipidemia  Home meds:  None  LDL 69, goal  < 70  Now on Lipitor 20 mg daily  Continue statin at discharge  Diabetes  HgbA1c 12.2, goal < 7.0  Uncontrolled  Not checking CBG at home  Now on lantus  SSI  CBG monitoring  Close PCP follow up   Other Stroke Risk Factors  ETOH use, advised to drink no more than 1 drink per day.  Other Active Problems  Outpatient referral to [facility]  Hospital day # 2  Marvel PlanJindong Escarlet Saathoff, MD PhD Stroke Neurology 01/05/2018 1:54 PM   To contact Stroke Continuity provider, please refer to [email or web address]. After hours, contact General Neurology

## 2018-01-05 NOTE — Procedures (Signed)
ELECTROENCEPHALOGRAM REPORT  Date of Study: 01/05/2018  Patient's Name: Manuel Baker MRN: [id number] Date of Birth: December 05, 1995  Referring Provider: Dr. Milon DikesAshish Arora  Clinical History: This is a 22 year old man with possible seizure.  Medications: Keppra Tylenol Aspirin Lipitor Insulin  Technical Summary: A multichannel digital EEG recording measured by the international 10-20 system with electrodes applied with paste and impedances below 5000 ohms performed in our laboratory with EKG monitoring in an awake and asleep patient.  Hyperventilation was not performed. Photic stimulation was performed.  The digital EEG was referentially recorded, reformatted, and digitally filtered in a variety of bipolar and referential montages for optimal display.    Description: The patient is awake and asleep during the recording.  During maximal wakefulness, there is a symmetric, medium voltage 10 Hz posterior dominant rhythm that attenuates with eye opening.  The record is symmetric.  During drowsiness and sleep, there is an increase in theta slowing of the background.  Vertex waves and symmetric sleep spindles were seen.  Photic stimulation did not elicit any abnormalities.  There were no epileptiform discharges or electrographic seizures seen.    EKG lead was unremarkable.  Impression: This awake and asleep EEG is normal.    Clinical Correlation: A normal EEG does not exclude a clinical diagnosis of epilepsy.  Clinical correlation is advised.   Patrcia DollyKaren Aquino, M.D.

## 2018-01-05 NOTE — Progress Notes (Signed)
PROGRESS NOTE    Manuel Baker  Manuel Baker:[id number] DOB: 12-03-1995 DOA: 01/02/2018 PCP: No primary care provider on file.   Brief Narrative: Manuel Baker is a 22 y.o. 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 Patient presented after concern for seizure and found to have multiple strokes.    Assessment & Plan:   Principal Problem:   Acute cardioembolic stroke [facility]) Active Problems:   Uncontrolled type 1 diabetes mellitus with hyperglycemia (HCC)   Seizure (HCC)   Stroke (cerebrum) (HCC)   Ebstein's anomaly   Stroke [facility])   Acute stroke Thought secondary to cardiac source in setting of Ebstein's anomaly. Transthoracic Echocardiogram pending. Likely with ASD. LE venous dopplers negative for DVT -Neurology recommendations: Transesophageal Echocardiogram pending -Cardiology consulted for Transesophageal Echocardiogram with bubble study -Continue statin -Continue aspirin  Seizure Patient with history. Not on antiepileptics as an outpatient -Neurology recommendations: Keppra -Seizure precautions  Diabetes mellitus, type 1, uncontrolled with hyperglycemia Hyperglycemic overnight. Hemoglobin A1C of 12.2%. Better controlled today. -Continue Lantus 30 units daily -Continue SSI  Ebstein's anomaly Patient receives care at [facility], in [place] per discussion with father. Partial repair 10 years ago. Per father, plan was to have second surgery at some point soon. -Cardiology recommends follow-up at [facility]   DVT prophylaxis: Lovenox Code Status: Full code Family Communication: None at bedside Disposition Plan: Home pending stroke workup   Consultants:   Neurology/Stroke  Cardiology  Procedures:   LE venous duplex without DVT  Transthoracic Echocardiogram (01/04/2018) Study Conclusions  - Left ventricle: The cavity size was normal. Wall  thickness was   normal. Systolic function was normal. The estimated ejection   fraction was in the range of 50% to 55%. 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 Interestingly the septal insertion   is currently not apically displaced Cannot r/o small residual   ASD. - Right atrium: The atrium was severely dilated. - Atrial septum: A septal defect cannot be excluded. - Tricuspid valve: There was severe regurgitation.  Antimicrobials:  None    Subjective: Feels fine. No issues overnight.  Objective: Vitals:   01/04/18 1819 01/04/18 2123 01/05/18 0159 01/05/18 0604  BP: 132/75 111/60 115/68 105/64  Pulse: 73 77 71 70  Resp: 19 18 18 18   Temp: 97.7 F (36.5 C) 98.3 F (36.8 C) 98.2 F (36.8 C) 98.1 F (36.7 C)  TempSrc: Oral Oral Oral Oral  SpO2: 96% 96% 97% 97%  Weight:       No intake or output data in the 24 hours ending 01/05/18 1049 Filed Weights   01/03/18 0443  Weight: 55.8 kg (123 lb)    Examination:  General exam: Appears calm and comfortable Respiratory system: Clear to auscultation bilaterally. Unlabored work of breathing. No wheezing or rales. Cardiovascular: Regular rate and rhythm. Normal S1 and S2. No heart murmurs present. No extra heart sounds Gastroenterology: Soft, non-tender, non-distended, no guarding, no rebound, no masses felt Central nervous system: Alert and oriented. No focal deficits Psychiatry: Judgement and insight appear normal. Mood & affect appropriate.     Data Reviewed: I have personally reviewed following labs and imaging studies  CBC: Recent Labs  Lab 01/02/18 1828 01/03/18 0621  WBC 13.1* 8.0  HGB 17.3*  14.4  HCT 48.4 41.9  MCV 81.2 80.4  PLT 265 269   Basic Metabolic Panel: Recent Labs  Lab 01/02/18 1828 01/03/18 0621 01/04/18 0446  NA 139 130* 137  K  3.9 4.3 3.9  CL 102 101 109  CO2 24 17* 19*  GLUCOSE 255* 522* 204*  BUN 10 11 13   CREATININE 1.16 1.30* 1.03  CALCIUM 9.4 8.4* 8.5*   GFR: CrCl cannot be calculated (Unknown ideal weight.). Liver Function Tests: Recent Labs  Lab 01/03/18 0621  AST 72*  ALT 39  ALKPHOS 88  BILITOT 1.2  PROT 5.5*  ALBUMIN 3.1*   No results for input(s): LIPASE, AMYLASE in the last 168 hours. No results for input(s): AMMONIA in the last 168 hours. Coagulation Profile: No results for input(s): INR, PROTIME in the last 168 hours. Cardiac Enzymes: No results for input(s): CKTOTAL, CKMB, CKMBINDEX, TROPONINI in the last 168 hours. BNP (last 3 results) No results for input(s): PROBNP in the last 8760 hours. HbA1C: Recent Labs    01/03/18 0623  HGBA1C 12.2*   CBG: Recent Labs  Lab 01/04/18 0646 01/04/18 1131 01/04/18 1651 01/04/18 2121 01/05/18 0610  GLUCAP 165* 177* 276* 319* 258*   Lipid Profile: Recent Labs    01/03/18 0623  CHOL 137  HDL 49  LDLCALC 69  TRIG 97  CHOLHDL 2.8   Thyroid Function Tests: No results for input(s): TSH, T4TOTAL, FREET4, T3FREE, THYROIDAB in the last 72 hours. Anemia Panel: No results for input(s): VITAMINB12, FOLATE, FERRITIN, TIBC, IRON, RETICCTPCT in the last 72 hours. Sepsis Labs: No results for input(s): PROCALCITON, LATICACIDVEN in the last 168 hours.  No results found for this or any previous visit (from the past 240 hour(s)).       Radiology Studies: No results found.      Scheduled Meds: .  stroke: mapping our early stages of recovery book   Does not apply Once  . aspirin  300 mg Rectal Daily   Or  . aspirin  325 mg Oral Daily  . atorvastatin  80 mg Oral q1800  . enoxaparin (LOVENOX) injection  40 mg Subcutaneous Q24H  . insulin aspart  0-9 Units Subcutaneous TID WC  . insulin glargine  30 Units Subcutaneous QHS  . levETIRAcetam  500 mg Oral BID   Continuous Infusions:    LOS: 2 days     Jacquelin Hawkingalph Apolonio Cutting, MD [facility] 01/05/2018, 10:49 AM Pager: [phone]  If 7PM-7AM, please contact night-coverage [email or web address] Password [email or web address] 01/05/2018, 10:49 AM

## 2018-01-05 NOTE — Progress Notes (Signed)
EEG complete - results pending 

## 2018-01-05 NOTE — H&P (View-Only) (Signed)
Progress Note  Patient Name: Suanne MarkerCedric Belue Date of Encounter: 01/05/2018  Primary Cardiologist: No primary care provider on file.   Subjective   Sleepy. Otherwise no complaints.   Inpatient Medications    Scheduled Meds: .  stroke: mapping our early stages of recovery book   Does not apply Once  . aspirin  300 mg Rectal Daily   Or  . aspirin  325 mg Oral Daily  . atorvastatin  80 mg Oral q1800  . enoxaparin (LOVENOX) injection  40 mg Subcutaneous Q24H  . insulin aspart  0-9 Units Subcutaneous TID WC  . insulin glargine  30 Units Subcutaneous QHS  . levETIRAcetam  500 mg Oral BID   Continuous Infusions:  PRN Meds: acetaminophen **OR** acetaminophen (TYLENOL) oral liquid 160 mg/5 mL **OR** acetaminophen   Vital Signs    Vitals:   01/04/18 1819 01/04/18 2123 01/05/18 0159 01/05/18 0604  BP: 132/75 111/60 115/68 105/64  Pulse: 73 77 71 70  Resp: 19 18 18 18   Temp: 97.7 F (36.5 C) 98.3 F (36.8 C) 98.2 F (36.8 C) 98.1 F (36.7 C)  TempSrc: Oral Oral Oral Oral  SpO2: 96% 96% 97% 97%  Weight:       No intake or output data in the 24 hours ending 01/05/18 0956 Filed Weights   01/03/18 0443  Weight: 123 lb (55.8 kg)    Telemetry    Sinus rhythm.  PVCs.  No events - Personally Reviewed  ECG   Sinus rhythm. Rate 72 bpm.  First degree AV block.  RBBB.  Prior septal infarct.     - Personally Reviewed  Physical Exam   VS:  BP 105/64 (BP Location: Right Arm)   Pulse 70   Temp 98.1 F (36.7 C) (Oral)   Resp 18   Wt 123 lb (55.8 kg)   SpO2 97%  , BMI There is no height or weight on file to calculate BMI. GENERAL:  Well appearing.  No acute distress HEENT: Pupils equal round and reactive, fundi not visualized, oral mucosa unremarkable NECK:  + jugular venous distention, waveform within normal limits, carotid upstroke brisk and symmetric, no bruits, no thyromegaly LYMPHATICS:  No cervical adenopathy LUNGS:  Clear to auscultation bilaterally HEART:  RRR.  PMI  not displaced or sustained,S1 and S2 within normal limits, no S3, no S4, no clicks, no rubs, no murmurs ABD:  Flat, positive bowel sounds normal in frequency in pitch, no bruits, no rebound, no guarding, no midline pulsatile mass, no hepatomegaly, no splenomegaly EXT:  2 plus pulses throughout, no edema, no cyanosis no clubbing SKIN:  No rashes no nodules NEURO:  Cranial nerves II through XII grossly intact, motor grossly intact throughout [facility]:  Cognitively intact, oriented to person place and time   Labs    Chemistry Recent Labs  Lab 01/02/18 1828 01/03/18 0621 01/04/18 0446  NA 139 130* 137  K 3.9 4.3 3.9  CL 102 101 109  CO2 24 17* 19*  GLUCOSE 255* 522* 204*  BUN 10 11 13   CREATININE 1.16 1.30* 1.03  CALCIUM 9.4 8.4* 8.5*  PROT  --  5.5*  --   ALBUMIN  --  3.1*  --   AST  --  72*  --   ALT  --  39  --   ALKPHOS  --  88  --   BILITOT  --  1.2  --   GFRNONAA >60 >60 >60  GFRAA >60 >60 >60  ANIONGAP 13 12 9  Hematology Recent Labs  Lab 01/02/18 1828 01/03/18 0621  WBC 13.1* 8.0  RBC 5.96* 5.21  HGB 17.3* 14.4  HCT 48.4 41.9  MCV 81.2 80.4  MCH 29.0 27.6  MCHC 35.7 34.4  RDW 13.4 13.4  PLT 265 269    Cardiac EnzymesNo results for input(s): TROPONINI in the last 168 hours. No results for input(s): TROPIPOC in the last 168 hours.   BNPNo results for input(s): BNP, PROBNP in the last 168 hours.   DDimer No results for input(s): DDIMER in the last 168 hours.   Radiology    No results found.  Cardiac Studies   Echo 01/04/18: Study Conclusions  - Left ventricle: The cavity size was normal. Wall thickness was   normal. Systolic function was normal. The estimated ejection   fraction was in the range of 50% to 55%. 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 Interestingly the septal insertion    is currently not apically displaced Cannot r/o small residual   ASD. - Right atrium: The atrium was severely dilated. - Atrial septum: A septal defect cannot be excluded. - Tricuspid valve: There was severe regurgitation.  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  Assessment & Plan   # Ebstein's anomaly:  Mr. Tomaso is euvolemic.  I personally reviewed his echocardiogram.  There appears to be tethering of the septal leaflet of the tricuspid valve.  There is also prolapse of the anterior leaflet.  These leaflets appear to be attached, possibly surgically.  He also has severe right ventricular systolic dysfunction.  His right ventricle was severely dilated and there is diastolic flattening of the interventricular septum, consistent with right ventricular volume overload.  Agree that he needs to be seen in adult congenital clinic for his failing RV.  There is no evidence of pre-exitation on his EKG.   # Embolic strokes: TEE planned for tomorrow to evaluate for ASD.  Would recommend an implantable loop recorder if negative and LE Dopplers are negative given that Ebstein's increases the risk of atrial fibrillation.       For questions or updates, please contact [facility] Please consult [email or web address] for contact info under Cardiology/STEMI.      Signed, Chilton Si, MD  01/05/2018, 9:56 AM

## 2018-01-05 NOTE — Care Management Note (Signed)
Case Management Note  Patient Details  Name: Manuel Baker MRN: [id number] Date of Birth: 07/02/1996  Subjective/Objective:        Pt admitted with stroke. He is currently enrolled in [employer]. [place].   Pt without a PCP in [place]. Pt does had [employer] insurance.          Action/Plan: CM consulted for a PCP. 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 Appointment made and on the AVS with [employer].  Pt states he has some medications at home but at d/c will need hard scripts to send to his mail order pharmacy. He does need some needles for his pen insulins which MD made aware of.  CM following.   Expected Discharge Date:                  Expected Discharge Plan:  Home/Self Care  In-House Referral:     Discharge planning Services  CM Consult  Post Acute Care Choice:    Choice offered to:     DME Arranged:    DME Agency:     HH Arranged:    HH Agency:     Status of Service:  Completed, signed off  If discussed at [employer] Length of Stay Meetings, dates discussed:    Additional Comments:  Kermit BaloKelli F Elesha Thedford, RN 01/05/2018, 12:57 PM

## 2018-01-06 ENCOUNTER — Encounter (HOSPITAL_COMMUNITY): Payer: Self-pay | Admitting: Certified Registered"

## 2018-01-06 ENCOUNTER — Encounter (HOSPITAL_COMMUNITY)
Admission: EM | Disposition: A | Discharge status: Home Health | Payer: Self-pay | Source: Home / Self Care | Attending: Family Medicine

## 2018-01-06 ENCOUNTER — Inpatient Hospital Stay (HOSPITAL_COMMUNITY): Payer: BLUE CROSS/BLUE SHIELD | Admitting: Anesthesiology

## 2018-01-06 ENCOUNTER — Inpatient Hospital Stay (HOSPITAL_COMMUNITY): Payer: BLUE CROSS/BLUE SHIELD

## 2018-01-06 ENCOUNTER — Inpatient Hospital Stay (HOSPITAL_COMMUNITY)
Admit: 2018-01-06 | Discharge: 2018-01-06 | Disposition: A | Discharge status: Home / Self Care | Payer: BLUE CROSS/BLUE SHIELD | Attending: Physician Assistant | Admitting: Physician Assistant

## 2018-01-06 DIAGNOSIS — I38 Endocarditis, valve unspecified: Secondary | ICD-10-CM

## 2018-01-06 DIAGNOSIS — Z8774 Personal history of (corrected) congenital malformations of heart and circulatory system: Secondary | ICD-10-CM

## 2018-01-06 DIAGNOSIS — M3211 Endocarditis in systemic lupus erythematosus: Secondary | ICD-10-CM

## 2018-01-06 DIAGNOSIS — I513 Intracardiac thrombosis, not elsewhere classified: Secondary | ICD-10-CM

## 2018-01-06 DIAGNOSIS — D72829 Elevated white blood cell count, unspecified: Secondary | ICD-10-CM

## 2018-01-06 DIAGNOSIS — I6389 Other cerebral infarction: Secondary | ICD-10-CM

## 2018-01-06 DIAGNOSIS — Q211 Atrial septal defect: Secondary | ICD-10-CM

## 2018-01-06 DIAGNOSIS — I240 Acute coronary thrombosis not resulting in myocardial infarction: Secondary | ICD-10-CM

## 2018-01-06 DIAGNOSIS — I330 Acute and subacute infective endocarditis: Secondary | ICD-10-CM

## 2018-01-06 DIAGNOSIS — I6340 Cerebral infarction due to embolism of unspecified cerebral artery: Principal | ICD-10-CM

## 2018-01-06 DIAGNOSIS — Z952 Presence of prosthetic heart valve: Secondary | ICD-10-CM

## 2018-01-06 DIAGNOSIS — E109 Type 1 diabetes mellitus without complications: Secondary | ICD-10-CM

## 2018-01-06 DIAGNOSIS — I5189 Other ill-defined heart diseases: Secondary | ICD-10-CM

## 2018-01-06 HISTORY — PX: TEE WITHOUT CARDIOVERSION: SHX5443

## 2018-01-06 LAB — BASIC METABOLIC PANEL
Anion gap: 9 (ref 5–15)
BUN: 11 mg/dL (ref 6–20)
CALCIUM: 9.2 mg/dL (ref 8.9–10.3)
CO2: 21 mmol/L — ABNORMAL LOW (ref 22–32)
Chloride: 111 mmol/L (ref 101–111)
Creatinine, Ser: 0.9 mg/dL (ref 0.61–1.24)
GFR calc Af Amer: >60 mL/min (ref >60)
GLUCOSE: 77 mg/dL (ref 65–99)
Potassium: 3.7 mmol/L (ref 3.5–5.1)
Sodium: 141 mmol/L (ref 135–145)

## 2018-01-06 LAB — BETA-2-GLYCOPROTEIN I ABS, IGG/M/A
Beta-2 Glyco I IgG: <9 GPI IgG units (ref 0–20)
Beta-2-Glycoprotein I IgA: <9 GPI IgA units (ref 0–25)
Beta-2-Glycoprotein I IgM: <9 GPI IgM units (ref 0–32)

## 2018-01-06 LAB — ANCA TITERS

## 2018-01-06 LAB — PROTIME-INR
INR: 1.07
Prothrombin Time: 13.8 seconds (ref 11.4–15.2)

## 2018-01-06 LAB — GLUCOSE, CAPILLARY
GLUCOSE-CAPILLARY: 41 mg/dL — AB (ref 65–99)
GLUCOSE-CAPILLARY: 95 mg/dL (ref 65–99)
GLUCOSE-CAPILLARY: 113 mg/dL — AB (ref 65–99)
Glucose-Capillary: 154 mg/dL — ABNORMAL HIGH (ref 65–99)
Glucose-Capillary: 160 mg/dL — ABNORMAL HIGH (ref 65–99)
Glucose-Capillary: 363 mg/dL — ABNORMAL HIGH (ref 65–99)

## 2018-01-06 LAB — CARDIOLIPIN ANTIBODIES, IGG, IGM, IGA: Anticardiolipin IgG: <9 GPL U/mL (ref 0–14)

## 2018-01-06 LAB — HEPARIN LEVEL (UNFRACTIONATED): Heparin Unfractionated: 0.22 IU/mL — ABNORMAL LOW (ref 0.30–0.70)

## 2018-01-06 LAB — SEDIMENTATION RATE: Sed Rate: 1 mm/hr (ref 0–16)

## 2018-01-06 SURGERY — ECHOCARDIOGRAM, TRANSESOPHAGEAL
Anesthesia: Monitor Anesthesia Care

## 2018-01-06 MED ORDER — SODIUM CHLORIDE 0.9% FLUSH: 10.0000 mL | INTRAVENOUS | Status: DC | PRN

## 2018-01-06 MED ORDER — BUTAMBEN-TETRACAINE-BENZOCAINE 2-2-14 % EX AERO
INHALATION_SPRAY | CUTANEOUS | Status: DC | PRN
  Administered 2018-01-06: 1 via TOPICAL

## 2018-01-06 MED ORDER — WARFARIN VIDEO
Freq: Once | Status: AC
  Administered 2018-01-06: 22:00:00

## 2018-01-06 MED ORDER — WARFARIN - PHARMACIST DOSING INPATIENT: Freq: Every day | Status: DC

## 2018-01-06 MED ORDER — LIDOCAINE 2% (20 MG/ML) 5 ML SYRINGE
INTRAMUSCULAR | Status: DC | PRN
Start: 2018-01-06 — End: 2018-01-06
  Administered 2018-01-06: 60 mg via INTRAVENOUS

## 2018-01-06 MED ORDER — HEPARIN (PORCINE) IN NACL 100-0.45 UNIT/ML-% IJ SOLN
900.0000 [IU]/h | INTRAMUSCULAR | Status: DC
  Administered 2018-01-06: 650 [IU]/h via INTRAVENOUS
  Administered 2018-01-07: 750 [IU]/h via INTRAVENOUS
  Filled 2018-01-06 (×2): qty 250

## 2018-01-06 MED ORDER — PROPOFOL 500 MG/50ML IV EMUL
INTRAVENOUS | Status: DC | PRN
  Administered 2018-01-06: 200 ug/kg/min via INTRAVENOUS

## 2018-01-06 MED ORDER — PATIENT'S GUIDE TO USING COUMADIN BOOK
Freq: Once | Status: AC
Start: 2018-01-06 — End: 2018-01-06
  Administered 2018-01-06: 18:00:00
  Filled 2018-01-06: qty 1

## 2018-01-06 MED ORDER — WARFARIN SODIUM 5 MG PO TABS
5.0000 mg | ORAL_TABLET | Freq: Once | ORAL | Status: AC
Start: 2018-01-06 — End: 2018-01-06
  Administered 2018-01-06: 5 mg via ORAL
  Filled 2018-01-06: qty 1

## 2018-01-06 MED ORDER — SODIUM CHLORIDE 0.9 % IV SOLN
INTRAVENOUS | Status: DC
  Administered 2018-01-06: 11:00:00 via INTRAVENOUS

## 2018-01-06 MED ORDER — SODIUM CHLORIDE 0.9% FLUSH
10.0000 mL | Freq: Two times a day (BID) | INTRAVENOUS | Status: DC
  Administered 2018-01-08: 10 mL

## 2018-01-06 MED ORDER — GLYCOPYRROLATE 0.2 MG/ML IV SOSY
PREFILLED_SYRINGE | INTRAVENOUS | Status: DC | PRN
  Administered 2018-01-06: .2 mg via INTRAVENOUS

## 2018-01-06 MED ORDER — PROPOFOL 10 MG/ML IV BOLUS
INTRAVENOUS | Status: DC | PRN
  Administered 2018-01-06: 40 mg via INTRAVENOUS

## 2018-01-06 NOTE — Progress Notes (Addendum)
ANTICOAGULATION CONSULT NOTE - Initial Consult  Pharmacy Consult for IV heparin/Coumadin Indication: stroke, RA with large thrombus burden  No Known Allergies  Patient Measurements: Height: 5\' 6"  (167.6 cm) Weight: 123 lb 0.3 oz (55.8 kg) IBW/kg (Calculated) : 63.8 Heparin Dosing Weight: 55.8 kg  Vital Signs: Temp: 97.7 F (36.5 C) (02/12 1210) Temp Source: Oral (02/12 1210) BP: 109/71 (02/12 1220) Pulse Rate: 67 (02/12 1220)  Labs: Recent Labs    01/04/18 0446 01/06/18 0954  LABPROT  --  13.8  INR  --  1.07  CREATININE 1.03 0.90    Estimated Creatinine Clearance: 103.3 mL/min (by C-G formula based on SCr of 0.9 mg/dL).   Medical History: Past Medical History:  Diagnosis Date  . Congenital heart disease   . Diabetes mellitus without complication (HCC)   . Seizures (HCC)     Medications:  Infusions:  . heparin      Assessment: 22 yo male admitted with new stroke, found with severe RV failure, RA enlargement with large thrombus burden.  Pharmacy asked to start IV heparin and Coumadin.  Goal of Therapy:  Heparin level 0.3-0.5 Monitor platelets by anticoagulation protocol: Yes   Plan:  1. Start IV heparin gtt at 650 units/hr. 2. Check heparin level in 6 hrs. 3. Daily heparin level and CBC. 4. Coumadin 5 mg x 1 tonight. 5. Daily PT/INR.  Tad MooreJessica Laniesha Das, Pharm D, BCPS  Clinical Pharmacist Pager [phone]  01/06/2018 12:47 PM

## 2018-01-06 NOTE — Progress Notes (Signed)
Pt's family has [facility] number of hospital [facility] pt [facility] heart [facility]  Fax number----  [phone]  Will notify Micah FlesherAngela Duke, PA with cardiology that had requested the information from pt/family.

## 2018-01-06 NOTE — Anesthesia Postprocedure Evaluation (Signed)
Anesthesia Post Note  Patient: Manuel Baker  Procedure(s) Performed: TRANSESOPHAGEAL ECHOCARDIOGRAM (TEE) (N/A )     Patient location during evaluation: PACU Anesthesia Type: MAC Level of consciousness: awake and alert Pain management: pain level controlled Vital Signs Assessment: post-procedure vital signs reviewed and stable Respiratory status: spontaneous breathing, nonlabored ventilation and respiratory function stable Cardiovascular status: stable and blood pressure returned to baseline Postop Assessment: no apparent nausea or vomiting Anesthetic complications: no    Last Vitals:  Vitals:   01/06/18 1210 01/06/18 1220  BP: 117/61 109/71  Pulse: 63 67  Resp: 20 (!) 21  Temp: 36.5 C   SpO2: 100% 100%    Last Pain:  Vitals:   01/06/18 1210  TempSrc: Oral  PainSc:                  Margaretmary Prisk,W. EDMOND

## 2018-01-06 NOTE — Progress Notes (Signed)
ANTICOAGULATION CONSULT NOTE  Pharmacy Consult:  Heparin Indication:  Stroke, RA with large thrombus burden  No Known Allergies  Patient Measurements: Height: 5\' 6"  (167.6 cm) Weight: 123 lb 0.3 oz (55.8 kg) IBW/kg (Calculated) : 63.8 Heparin Dosing Weight: 55 kg  Vital Signs: Temp: 98.4 F (36.9 C) (02/12 2104) Temp Source: Oral (02/12 2104) BP: 103/65 (02/12 2104) Pulse Rate: 92 (02/12 2104)  Labs: Recent Labs    01/04/18 0446 01/06/18 0954 01/06/18 2023  LABPROT  --  13.8  --   INR  --  1.07  --   HEPARINUNFRC  --   --  0.22*  CREATININE 1.03 0.90  --     Estimated Creatinine Clearance: 103.3 mL/min (by C-G formula based on SCr of 0.9 mg/dL).    Assessment: 22 yo male admitted with new stroke, found with severe RV failure, RA enlargement with large thrombus burden.  Pharmacy asked to manage IV heparin bridge to Coumadin.  Heparin level is sub-therapeutic.  No issue with heparin infusion nor bleeding per RN.   Goal of Therapy:  Heparin level 0.3-0.5 units/mL Monitor platelets by anticoagulation protocol: Yes     Plan:  Increase heparin gtt to 750 units/hr F/U AM labs   Jamisen Duerson D. Laney Potashang, PharmD, BCPS Pager:  [phone] 01/06/2018, 9:33 PM

## 2018-01-06 NOTE — Progress Notes (Signed)
STROKE TEAM PROGRESS NOTE   SUBJECTIVE (INTERVAL HISTORY) Multiple family members are present at bedside. Pt is seeking in bed, awake alert, moving all extremities and no complains. Had TEE today showed RA thrombus with large PFO. On heparin and needs to rule out SBE.    OBJECTIVE Temp:  [97.7 F (36.5 C)-98.5 F (36.9 C)] 98.4 F (36.9 C) (02/12 1736) Pulse Rate:  [63-83] 74 (02/12 1736) Cardiac Rhythm: Normal sinus rhythm;Heart block (02/12 0740) Resp:  [10-21] 20 (02/12 1736) BP: (109-121)/(61-80) 113/66 (02/12 1736) SpO2:  [95 %-100 %] 96 % (02/12 1736) Weight:  [123 lb 0.3 oz (55.8 kg)] 123 lb 0.3 oz (55.8 kg) (02/11 2200)  CBC:  Recent Labs  Lab 01/02/18 1828 01/03/18 0621  WBC 13.1* 8.0  HGB 17.3* 14.4  HCT 48.4 41.9  MCV 81.2 80.4  PLT 265 269    Basic Metabolic Panel:  Recent Labs  Lab 01/04/18 0446 01/06/18 0954  NA 137 141  K 3.9 3.7  CL 109 111  CO2 19* 21*  GLUCOSE 204* 77  BUN 13 11  CREATININE 1.03 0.90  CALCIUM 8.5* 9.2    Lipid Panel:     Component Value Date/Time   CHOL 137 01/03/2018 0623   TRIG 97 01/03/2018 0623   HDL 49 01/03/2018 0623   CHOLHDL 2.8 01/03/2018 0623   VLDL 19 01/03/2018 0623   LDLCALC 69 01/03/2018 0623   HgbA1c:  Lab Results  Component Value Date   HGBA1C 12.2 (H) 01/03/2018   Urine Drug Screen:     Component Value Date/Time   LABOPIA NONE DETECTED 01/03/2018 0116   COCAINSCRNUR NONE DETECTED 01/03/2018 0116   LABBENZ NONE DETECTED 01/03/2018 0116   AMPHETMU NONE DETECTED 01/03/2018 0116   THCU NONE DETECTED 01/03/2018 0116   LABBARB NONE DETECTED 01/03/2018 0116    Alcohol Level No results found for: ETH  IMAGING I have personally reviewed the radiological images below and agree with the radiology interpretations.  Ct Angio Head W Or Wo Contrast Ct Angio Neck W Or Wo Contrast 01/03/2018 IMPRESSION:  1. Negative CTA of the head and neck. No occlusion, stenosis, or embolic source identified.  2.  Partially visualized postoperative heart. History of congenital heart disease.   Ct Head Wo Contrast 01/02/2018 IMPRESSION:  1. 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 There is no definite communication with the lateral ventricle. MRI of the head without and with contrast could be used for further evaluation.  2. No acute intracranial abnormality or focal abnormality to explain seizures otherwise.   Mr Laqueta Jean And Wo Contrast 01/03/2018 IMPRESSION:  1. Small foci of reduced diffusion are present within the left frontal white matter, left putamen, bilateral occipital lobes, and right cerebellum probably representing acute/early subacute infarctions. The pattern is atypical for seizure activity. Additionally, there are several punctate sequelae of chronic microhemorrhage throughout the brain and chronic infarcts in the right cerebellum. Given patient age, embolic phenomenon possibly with cardiac defect, sequelae of vasculitis, traumatic brain injury, or hematologic disorder should be considered.  2. 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  3. No additional structural cause of seizure identified.   EEG normal EEG  Transthoracic Echocardiogram - Left ventricle: The cavity size was normal. Wall thickness was   normal. Systolic function was normal. The estimated ejection   fraction was in the range of 50% to 55%. 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 Interestingly the septal insertion   is currently not apically displaced Cannot r/o small residual   ASD. - Right atrium: The atrium was severely dilated. - Atrial septum: A septal defect cannot be excluded. - Tricuspid valve: There was severe regurgitation.  Bilateral Lower Extremity  Dopplers - no DVT bilaterally  TEE Severe RV failure Wide Open TR with failure of leaflet coaptation Possible vegetation on lateral leaflet Severe RA enlargement with large burden of thrombus In RA dome  ASD/large PFO with large amount of shunting right to left By bubble study Normal PV  TV appears to have been repaired with annuloplasty ring Septal leaflet is not displaced as ou would typically see With Ebsteins.   PHYSICAL EXAM Vitals:   01/06/18 1210 01/06/18 1220 01/06/18 1354 01/06/18 1736  BP: 117/61 109/71 117/71 113/66  Pulse: 63 67 73 74  Resp: 20 (!) 21 20 20   Temp: 97.7 F (36.5 C)  97.9 F (36.6 C) 98.4 F (36.9 C)  TempSrc: Oral  Oral Oral  SpO2: 100% 100% 97% 96%  Weight:      Height:        Pleasant young african [place] male not in distress. . Afebrile. Head is nontraumatic. Neck is supple without bruit.    Cardiac exam no murmur or gallop. Lungs are clear to auscultation. Distal pulses are well felt.midline chest surgical scar. Bite marks on lateral aspect of tongue Neurological Exam ;  Awake  Alert oriented x 3. Normal speech and language.eye movements full without nystagmus.fundi were not visualized. Vision acuity and fields appear normal. Hearing is normal. Palatal movements are normal. Face symmetric. Tongue midline. Normal strength, tone, reflexes and coordination. Normal sensation. Gait deferred.   HOME MEDICATIONS:  Medications Prior to Admission  Medication Sig Dispense Refill  . insulin glargine (LANTUS) 100 UNIT/ML injection Inject 30 Units into the skin at bedtime.    . insulin lispro (HUMALOG) 100 UNIT/ML injection Inject 10-30 Units into the skin 3 (three) times daily before meals. Sliding scale        HOSPITAL MEDICATIONS:  . aspirin EC  325 mg Oral Daily  . atorvastatin  20 mg Oral q1800  . insulin aspart  0-9 Units Subcutaneous TID WC  . insulin glargine  30 Units Subcutaneous QHS  . levETIRAcetam  500 mg Oral BID  . warfarin    Does not apply Once  . Warfarin - Pharmacist Dosing Inpatient   Does not apply q1800    ALLERGIES No Known Allergies  PAST MEDICAL HISTORY Past Medical History:  Diagnosis Date  . Congenital heart disease   . Diabetes mellitus without complication (HCC)   . Seizures (HCC)     SURGICAL HISTORY Past Surgical History:  Procedure Laterality Date  . open heart surgery      FAMILY HISTORY Family History  Problem Relation Age of Onset  . Diabetes Mellitus I Sister      ASSESSMENT/PLAN Mr. Mykael Batz is a 22 y.o. male with history of Epsteins anomaly S/P surgical repair age 28, chronic headaches, seizure history, and diabetes, presenting with possible seizure activity. He did not receive IV t-PA due to late presentation.  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  MRI head - Small foci of reduced diffusion in the left frontal white matter, left putamen, bilateral occipital lobes, and right cerebellum probably representing acute/early subacute infarctions.  CTA head and neck - unremarkable  EEG - normal   LE Dopplers - no DVT  2D Echo - EF 50-55%, severely enlarged RA and TV morphologically abnormal with some tethering and severe residual TR.  LDL - 69  HgbA1c - 12.2  VTE prophylaxis - heparin IV Diet heart healthy/carb modified Room service appropriate? Yes; Fluid consistency: Thin  No antithrombotic prior to admission, now on heparin IV  Patient counseled to be compliant with his antithrombotic medications  Ongoing aggressive stroke risk factor management  Therapy recommendations: none  Disposition:  Pending  Ebsteins anomaly s/p repair   Done at age of 60  No regular cardiology follow up anymore  Cardiology on  board  TTE showed severely enlarged RA  TEE showed RA thrombus with ASD/large PFO  Blood culture pending to rule out SBE  On coumadin with heparin bridge   ASA discontinued  Seizure  Current presentation resembles seizure  Hx of seizure in young  Now on keppra - continue  EEG normal  Hypertension  Blood pressure runs mildly low Long-term BP goal normotensive  Hyperlipidemia  Home meds:  None  LDL 69, goal < 70  Now on Lipitor 20 mg daily  Continue statin at discharge  Diabetes  HgbA1c 12.2, goal < 7.0  Uncontrolled  Not checking CBG at home  Now on lantus  SSI  CBG fluctuates  Close PCP follow up   Other Stroke Risk Factors  ETOH use, advised to drink no more than 1 drink per day.  Other Active Problems  Outpatient referral to [facility]  Hospital day # 3  Neurology will sign off. Please call with questions. Pt [facility] 6 weeks. Thanks for the consult.  Marvel PlanJindong Joleen Stuckert, MD PhD Stroke Neurology 01/06/2018 6:15 PM   To contact Stroke Continuity provider, please refer to [email or web address]. After hours, contact General Neurology

## 2018-01-06 NOTE — Evaluation (Signed)
Speech Language Pathology Evaluation Patient Details Name: Manuel Baker MRN: [id number] DOB: Mar 04, 1996 Today's Date: 01/06/2018 Time: 0710-0725 SLP Time Calculation (min) (ACUTE ONLY): 15 min  Problem List:  Patient Active Problem List   Diagnosis Date Noted  . 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 Uncontrolled type 1 diabetes mellitus with hyperglycemia (HCC) 01/03/2018  . Seizure (HCC) 01/03/2018  . Stroke (cerebrum) (HCC) 01/03/2018  . Stroke (HCC) 01/03/2018  . Ebstein's anomaly    Past Medical History:  Past Medical History:  Diagnosis Date  . Congenital heart disease   . Diabetes mellitus without complication (HCC)   . Seizures (HCC)    Past Surgical History:  Past Surgical History:  Procedure Laterality Date  . 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 He had gone to his [facility] and was referred to the ER. Patient otherwise denies any focal deficits visual symptoms difficulty speaking or swallowing. Has been having generalized body aches and headache. In the ER patient appears to be nonfocal. MRI of the brain shows features concerning for multifocal stroke and neurology was consulted. Patient was loaded on Keppra for seizures. Patient passed stroke swallow. Patient admitted for further workup of stroke and seizures. Small foci of reduced diffusion are present within the left frontal white matter, left putamen, bilateral occipital lobes, and right cerebellum probably representing acute/early subacute infarctions. The pattern is atypical for seizure activity. Additionally, there are several punctate sequelae of chronic microhemorrhage throughout the brain and chronic infarcts in the    Assessment / Plan / Recommendation Clinical Impression  Chart reviewed and no observed s/s of cognitive deficits noted in OT/PT sessions or in nursing notes. During this evaluation, pt able to awaken from sleep and demonstrate functional high level problem solving, selective attention, awareness and manipulate screens within computer present. Pt's cognitive abilities appear functional for age. No further skilled ST is indicated at this time. ST to sign off.     SLP Assessment  SLP Recommendation/Assessment: Patient does not need any further Speech Lanaguage Pathology Services SLP Visit Diagnosis: Cognitive communication deficit (R41.841)    Follow Up Recommendations  None    Frequency and Duration           SLP Evaluation Cognition  Overall Cognitive Status: Within Functional Limits for tasks assessed Arousal/Alertness: Awake/alert Orientation Level: Oriented X4 Attention: Selective Selective Attention: Appears intact Memory: Appears intact Awareness: Appears intact Problem Solving: Appears intact Safety/Judgment: Appears intact       Comprehension  Auditory Comprehension Overall Auditory Comprehension: Appears within functional limits for tasks assessed Visual Recognition/Discrimination Discrimination: Within Function Limits Reading Comprehension Reading Status: Within funtional limits    Expression Expression Primary Mode of Expression: Verbal Verbal Expression Overall Verbal Expression: Appears within functional limits for tasks assessed Pragmatics: No impairment Non-Verbal Means of Communication: Not applicable Written Expression Dominant Hand: Right Written Expression: Within Functional Limits   Oral / Motor  Oral Motor/Sensory Function Overall Oral Motor/Sensory Function: Within functional limits Motor Speech Overall Motor Speech: Appears within functional limits for tasks assessed Respiration: Within functional limits Phonation: Normal Resonance: Within  functional limits Articulation: Within functional limitis Intelligibility: Intelligible Motor Planning: Witnin functional limits Motor Speech Errors: Not applicable   GO  Manuel Baker 01/06/2018, 7:38 AM

## 2018-01-06 NOTE — Interval H&P Note (Signed)
History and Physical Interval Note:  01/06/2018 11:08 AM  Manuel Baker  has presented today for surgery, with the diagnosis of STROKE  The various methods of treatment have been discussed with the patient and family. After consideration of risks, benefits and other options for treatment, the patient has consented to  Procedure(s): TRANSESOPHAGEAL ECHOCARDIOGRAM (TEE) (N/A) as a surgical intervention .  The patient's history has been reviewed, patient examined, no change in status, stable for surgery.  I have reviewed the patient's chart and labs.  Questions were answered to the patient's satisfaction.     Charlton HawsPeter Khani Paino

## 2018-01-06 NOTE — CV Procedure (Signed)
TEE: Propofol  Severe RV failure Wide Open TR with failure of leaflet coaptation Possible vegetation on lateral leaflet Severe RA enlargement with large burden of thrombus In RA dome  ASD/large PFO with large amount of shunting right to left By bubble study Normal PV  TV appears to have been repaired with annuloplasty ring Septal leaflet is not displaced as ou would typically see With Ebsteins.   See full report in Epic Discussed with Dr [facility] Salviaandolph Needs SBE r/o and started on heparin   Charlton HawsPeter Shahla Betsill

## 2018-01-06 NOTE — Transfer of Care (Signed)
Immediate Anesthesia Transfer of Care Note  Patient: Manuel Baker  Procedure(s) Performed: TRANSESOPHAGEAL ECHOCARDIOGRAM (TEE) (N/A )  Patient Location: Endoscopy Unit  Anesthesia Type:MAC  Level of Consciousness: drowsy  Airway & Oxygen Therapy: Patient Spontanous Breathing and Patient connected to nasal cannula oxygen  Post-op Assessment: Report given to RN and Post -op Vital signs reviewed and stable  Post vital signs: Reviewed and stable  Last Vitals:  Vitals:   01/06/18 1210 01/06/18 1220  BP: 117/61 109/71  Pulse: 63 67  Resp: 20 (!) 21  Temp: 36.5 C   SpO2: 100% 100%    Last Pain:  Vitals:   01/06/18 1210  TempSrc: Oral  PainSc:          Complications: No apparent anesthesia complications

## 2018-01-06 NOTE — Progress Notes (Deleted)
  Echocardiogram 2D Echocardiogram has been performed.  Manuel Baker, Manuel Baker F 01/06/2018, 12:32 PM

## 2018-01-06 NOTE — Consult Note (Signed)
Date of Admission:  01/02/2018          Reason for Consult: valvular vegetation    Referring Provider: Dr. Caleb Popp  Assessment: Manuel Baker is a 22yo male with PMH of Ebstein's anomaly with ?partial repair, T1DM, h/o seizures admitted for possible seizure and found to have multiple bil punctate cardioembolic infarcts w/o residual deficits; TEE shows 1cm TV vegetation, significant right atrial clot burden with atrial dilation, PFO vs ASD. Patient has had no fevers, no infectious symptoms or risk factors for IE. 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   Plan: 1. F/u blood cultures; get 2 more sets of blood cultures then will start empiric antibiotics (likely tomorrow) 2. Follow up orthopantogram  Principal Problem:   Acute cardioembolic stroke [facility]) Active Problems:   Uncontrolled type 1 diabetes mellitus with hyperglycemia (HCC)   Seizure (HCC)   Stroke (cerebrum) (HCC)   Ebstein's anomaly   Stroke (HCC)  Scheduled Meds: . aspirin EC  325 mg Oral Daily  . atorvastatin  20 mg Oral q1800  . insulin aspart  0-9 Units Subcutaneous TID WC  . insulin glargine  30 Units Subcutaneous QHS  . levETIRAcetam  500 mg Oral BID  . patient's guide to using coumadin book   Does not apply Once  . warfarin  5 mg Oral ONCE-1800  . warfarin   Does not apply Once  . Warfarin - Pharmacist Dosing Inpatient   Does not apply q1800   Continuous Infusions: . heparin 650 Units/hr (01/06/18 1301)   PRN Meds:.acetaminophen **OR** acetaminophen (TYLENOL) oral liquid 160 mg/5 mL **OR** acetaminophen  HPI: Manuel Baker is a 22 y.o. male with h/o seizures not on antiepileptics since 9th grade, Ebstein's anomaly s/p partial correction ~70yrs ago, T1DM who presented to [facility] with concern for seizure after waking up with tongue bite and generalized body aches. MRI obtained in the ED showed bilateral multifocal  stroke as well as evidence of microhemorrhages and chronic punctate infarcts in right cerebellum. Hematologic workup is underway. Echo showed sever TR dysfunction; TEE showed significant RA dilation and dome burden, abnormal TV with 1cm vegetation, and RV dysfunction. LE dopplers were obtained which did not show DTVs.   Patient and family deny family history of autoimmune disease, clotting or bleeding disorders. Patient denies travel outside of [place], recent illness, recent infection, illicit drug use.   Review of Systems: Review of Systems  Constitutional: Negative for chills and fever.  HENT: Negative for congestion.   Respiratory: Negative for cough and shortness of breath.   Cardiovascular: Negative for chest pain, palpitations and leg swelling.  Gastrointestinal: Negative for abdominal pain, nausea and vomiting.  Musculoskeletal: Positive for myalgias.  Skin: Negative for rash.  Neurological: Negative for dizziness, tingling, sensory change, focal weakness, weakness and headaches.  Psychiatric/Behavioral: Negative for substance abuse.    Past Medical History:  Diagnosis Date  . Congenital heart disease   . Diabetes mellitus without complication (HCC)   . Seizures (HCC)     Social History   Tobacco Use  . Smoking status: Never Smoker  . Smokeless tobacco: Never Used  Substance Use Topics  . Alcohol use: Yes    Frequency: Never    Comment: social  . Drug use: Yes    Types: Marijuana    Family History  Problem Relation Age of Onset  . Diabetes Mellitus I Sister    No Known Allergies  OBJECTIVE: Blood pressure 109/71, pulse 67, temperature 97.7 F (36.5 C), temperature source Oral, resp. rate (!) 21, height 5\' 6"  (1.676 m), weight 123 lb 0.3 oz (55.8 kg), SpO2 100 %.  Physical Exam  Constitutional: NAD HEENT: no obvious dental abscess, PERRL, EOMI CV: RRR, no M/R/G, no LE edema Resp: CTAB, no increased work of breathing Abd: soft, NDNT, +BS Ext: moves all 4  freely Skin: no lesions, rash; no janeway lesions, roths spots Psych: not anxious or depressed appearing Neuro: CN 2-12 intact, moves all 4 extremities  Lab Results Lab Results  Component Value Date   WBC 8.0 01/03/2018   HGB 14.4 01/03/2018   HCT 41.9 01/03/2018   MCV 80.4 01/03/2018   PLT 269 01/03/2018    Lab Results  Component Value Date   CREATININE 0.90 01/06/2018   BUN 11 01/06/2018   NA 141 01/06/2018   K 3.7 01/06/2018   CL 111 01/06/2018   CO2 21 (L) 01/06/2018    Lab Results  Component Value Date   ALT 39 01/03/2018   AST 72 (H) 01/03/2018   ALKPHOS 88 01/03/2018   BILITOT 1.2 01/03/2018     Microbiology: No results found for this or any previous visit (from the past 240 hour(s)).  Nyra MarketGorica Adewale Pucillo, MD PGY - [facility] [phone] pager  01/06/2018, 1:43 PM

## 2018-01-06 NOTE — Progress Notes (Deleted)
  Echocardiogram 2D Echocardiogram has been performed.  Manuel Baker, Artem Bunte F 01/06/2018, 12:47 PM

## 2018-01-06 NOTE — Progress Notes (Signed)
Hypoglycemic Event  CBG: 41   Treatment: 15 GM carbohydrate snack  Symptoms: None  Follow-up CBG: Time:1346 CBG Result:160  Possible Reasons for Event: Inadequate meal intake  Comments/MD notified: pt was NPO for TEE, no meal intake, post procedure, CBG 41, carbs given, meal ordered. Glucose up. Dr Caleb PoppNettey rounded at 1415 and informed.     Buford DresserPennington, Inara Dike E

## 2018-01-06 NOTE — Anesthesia Preprocedure Evaluation (Addendum)
Anesthesia Evaluation  Patient identified by MRN, date of birth, ID band Patient awake    Reviewed: Allergy & Precautions, H&P , NPO status , Patient's Chart, lab work & pertinent test results  Airway Mallampati: II  TM Distance: >3 FB Neck ROM: Full    Dental no notable dental hx. (+) Teeth Intact, Dental Advisory Given   Pulmonary neg pulmonary ROS,    Pulmonary exam normal breath sounds clear to auscultation       Cardiovascular negative cardio ROS   Rhythm:Regular Rate:Normal     Neuro/Psych CVA, No Residual Symptoms negative psych ROS   GI/Hepatic negative GI ROS, Neg liver ROS,   Endo/Other  diabetes, Insulin Dependent  Renal/GU negative Renal ROS  negative genitourinary   Musculoskeletal   Abdominal   Peds  Hematology negative hematology ROS (+)   Anesthesia Other Findings   Reproductive/Obstetrics negative OB ROS                           Anesthesia Physical Anesthesia Plan  ASA: III  Anesthesia Plan: MAC   Post-op Pain Management:    Induction: Intravenous  PONV Risk Score and Plan: 1 and Propofol infusion  Airway Management Planned: Nasal Cannula  Additional Equipment:   Intra-op Plan:   Post-operative Plan:   Informed Consent: I have reviewed the patients History and Physical, chart, labs and discussed the procedure including the risks, benefits and alternatives for the proposed anesthesia with the patient or authorized representative who has indicated his/her understanding and acceptance.   Dental advisory given  Plan Discussed with: CRNA  Anesthesia Plan Comments:         Anesthesia Quick Evaluation

## 2018-01-06 NOTE — Progress Notes (Signed)
PROGRESS NOTE    Manuel MarkerCedric Baker  RUE:[id number]:[id number] DOB: 07/24/1996 DOA: 01/02/2018 PCP: No primary care provider on file.   Brief Narrative: Manuel MarkerCedric Baker is a 22 y.o. 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 Patient presented after concern for seizure and found to have multiple strokes. Transesophageal Echocardiogram significant for ASD and ?vegetation. Venous duplex negative for DVT. Plan for loop recorder. Blood culture pending.     Assessment & Plan:   Principal Problem:   Acute cardioembolic stroke [facility](HCC) Active Problems:   Uncontrolled type 1 diabetes mellitus with hyperglycemia (HCC)   Seizure (HCC)   Stroke (cerebrum) (HCC)   Ebstein's anomaly   Stroke [facility](HCC)   Acute stroke Thought secondary to cardiac source in setting of Ebstein's anomaly. Transthoracic Echocardiogram pending. Likely with ASD. LE venous dopplers negative for DVT. 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 vegetation -Neurology recommendations -Cardiology recommendations -Loop recorder pending -Continue statin -Continue aspirin  Seizure Patient with history. Not on antiepileptics as an outpatient -Neurology recommendations: Keppra -Seizure precautions -Outpatient follow-up with neurology  Diabetes mellitus, type 1, uncontrolled with hyperglycemia Hyperglycemic overnight. Hemoglobin A1C of 12.2%. -Continue Lantus 30 units daily -Continue SSI  Ebstein's anomaly [facility] [place] discussion with [place]. Partial repair 10 years ago. Per father, plan was to have second surgery at some point soon. -Cardiology [facility]  Heart vegetation Possible endocarditis. 1 major/2 minor Dukes criteria. -Blood culture pending -ID consulted   DVT prophylaxis:  Lovenox Code Status: Full code Family Communication: None at bedside Disposition Plan: Home today if able to obtain loop recorder.   Consultants:   Neurology/Stroke  Cardiology  Procedures:   LE venous duplex without DVT  Transthoracic Echocardiogram (01/04/2018) Study Conclusions  - Left ventricle: The cavity size was normal. Wall thickness was   normal. Systolic function was normal. The estimated ejection   fraction was in the range of 50% to 55%. 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 Interestingly the septal insertion   is currently not apically displaced Cannot r/o small residual   ASD. - Right atrium: The atrium was severely dilated. - Atrial septum: A septal defect cannot be excluded. - Tricuspid valve: There was severe regurgitation.  Antimicrobials:  None    Subjective: No concerns  Objective: Vitals:   01/06/18 1044 01/06/18 1209 01/06/18 1210 01/06/18 1220  BP: 119/80  117/61 109/71  Pulse: 74 63 63 67  Resp: 10 (!) 21 20 (!) 21  Temp: 98.4 F (36.9 C) 97.7 F (36.5 C) 97.7 F (36.5 C)   TempSrc: Oral Oral Oral   SpO2: 99% 100% 100% 100%  Weight:      Height:        Intake/Output Summary (Last 24 hours) at 01/06/2018 1239 Last data filed at 01/06/2018 1202 Gross per 24 hour  Intake 1120 ml  Output 400 ml  Net 720 ml   Filed Weights   01/03/18 0443 01/05/18 2200  Weight: 55.8 kg (123 lb) 55.8 kg (123 lb 0.3 oz)    Examination:  General exam: Appears calm and comfortable Respiratory system: Clear to auscultation bilaterally. Unlabored work of breathing. No wheezing or  rales. Cardiovascular: Regular rate and rhythm. Normal S1 and S2. No heart murmurs present. No extra heart sounds Gastroenterology: Soft, non-tender, non-distended, no guarding, no rebound, no masses felt Central  nervous system: Alert and oriented. No focal deficits Psychiatry: Judgement and insight appear normal. Mood & affect appropriate.     Data Reviewed: I have personally reviewed following labs and imaging studies  CBC: Recent Labs  Lab 01/02/18 1828 01/03/18 0621  WBC 13.1* 8.0  HGB 17.3* 14.4  HCT 48.4 41.9  MCV 81.2 80.4  PLT 265 269   Basic Metabolic Panel: Recent Labs  Lab 01/02/18 1828 01/03/18 0621 01/04/18 0446 01/06/18 0954  NA 139 130* 137 141  K 3.9 4.3 3.9 3.7  CL 102 101 109 111  CO2 24 17* 19* 21*  GLUCOSE 255* 522* 204* 77  BUN 10 11 13 11   CREATININE 1.16 1.30* 1.03 0.90  CALCIUM 9.4 8.4* 8.5* 9.2   GFR: Estimated Creatinine Clearance: 103.3 mL/min (by C-G formula based on SCr of 0.9 mg/dL). Liver Function Tests: Recent Labs  Lab 01/03/18 0621  AST 72*  ALT 39  ALKPHOS 88  BILITOT 1.2  PROT 5.5*  ALBUMIN 3.1*   No results for input(s): LIPASE, AMYLASE in the last 168 hours. No results for input(s): AMMONIA in the last 168 hours. Coagulation Profile: Recent Labs  Lab 01/06/18 0954  INR 1.07   Cardiac Enzymes: No results for input(s): CKTOTAL, CKMB, CKMBINDEX, TROPONINI in the last 168 hours. BNP (last 3 results) No results for input(s): PROBNP in the last 8760 hours. HbA1C: No results for input(s): HGBA1C in the last 72 hours. CBG: Recent Labs  Lab 01/05/18 0610 01/05/18 1148 01/05/18 1617 01/05/18 2131 01/06/18 0624  GLUCAP 258* 205* 277* 340* 154*   Lipid Profile: No results for input(s): CHOL, HDL, LDLCALC, TRIG, CHOLHDL, LDLDIRECT in the last 72 hours. Thyroid Function Tests: No results for input(s): TSH, T4TOTAL, FREET4, T3FREE, THYROIDAB in the last 72 hours. Anemia Panel: No results for input(s): VITAMINB12, FOLATE, FERRITIN, TIBC, IRON, RETICCTPCT in the last 72 hours. Sepsis Labs: No results for input(s): PROCALCITON, LATICACIDVEN in the last 168 hours.  No results found for this or any previous visit (from the past  240 hour(s)).       Radiology Studies: Vas [place] Lower Extremity Venous (dvt)  Result Date: 01/05/2018  Lower Venous Study Indication: Stroke with ASD. Examination Guidelines: A complete evaluation includes B-mode imaging, spectral doppler, color doppler, and power doppler as needed of all accessible portions of each vessel. Bilateral testing is considered an integral part of a complete examination. Limited examinations for reoccurring indications may be performed as noted. The reflux portion of the exam is performed with the patient in reverse Trendelenburg.  Right Venous Findings: +---------+---------------+---------+-----------+----------+-------+          CompressibilityPhasicitySpontaneityPropertiesSummary +---------+---------------+---------+-----------+----------+-------+ CFV      Full           Yes      Yes                          +---------+---------------+---------+-----------+----------+-------+ FV Prox  Full                                                 +---------+---------------+---------+-----------+----------+-------+ FV Mid   Full                                                 +---------+---------------+---------+-----------+----------+-------+  FV DistalFull                                                 +---------+---------------+---------+-----------+----------+-------+ PFV      Full                                                 +---------+---------------+---------+-----------+----------+-------+ POP      Full           Yes      Yes                          +---------+---------------+---------+-----------+----------+-------+ PTV      Full                                                 +---------+---------------+---------+-----------+----------+-------+ PERO     Full                                                 +---------+---------------+---------+-----------+----------+-------+  Left Venous Findings:  +---------+---------------+---------+-----------+----------+-------+          CompressibilityPhasicitySpontaneityPropertiesSummary +---------+---------------+---------+-----------+----------+-------+ CFV      Full           Yes      Yes                          +---------+---------------+---------+-----------+----------+-------+ FV Prox  Full                                                 +---------+---------------+---------+-----------+----------+-------+ FV Mid   Full                                                 +---------+---------------+---------+-----------+----------+-------+ FV DistalFull                                                 +---------+---------------+---------+-----------+----------+-------+ PFV      Full                                                 +---------+---------------+---------+-----------+----------+-------+ POP      Full           Yes      Yes                          +---------+---------------+---------+-----------+----------+-------+ PTV  Full                                                 +---------+---------------+---------+-----------+----------+-------+ PERO     Full                                                 +---------+---------------+---------+-----------+----------+-------+    Final Interpretation: Right: There is no evidence of deep vein thrombosis in the lower extremity. No cystic structure found in the popliteal fossa. Left: There is no evidence of deep vein thrombosis in the lower extremity. No cystic structure found in the popliteal fossa.  *See table(s) above for measurements and observations. Electronically signed by Gretta Began on 01/05/2018 at 2:10:59 PM.   Final        Scheduled Meds: . aspirin EC  325 mg Oral Daily  . atorvastatin  20 mg Oral q1800  . enoxaparin (LOVENOX) injection  40 mg Subcutaneous Q24H  . insulin aspart  0-9 Units Subcutaneous TID WC  . insulin glargine  30  Units Subcutaneous QHS  . levETIRAcetam  500 mg Oral BID   Continuous Infusions:    LOS: 3 days     Jacquelin Hawking, MD [facility] 01/06/2018, 12:39 PM Pager: [phone]  If 7PM-7AM, please contact night-coverage [email or web address] Password [email or web address] 01/06/2018, 12:39 PM

## 2018-01-06 NOTE — Progress Notes (Signed)
  Echocardiogram Echocardiogram Transesophageal has been performed.  Roosvelt MaserLane, Kaeleigh Westendorf F 01/06/2018, 12:48 PM

## 2018-01-06 NOTE — Progress Notes (Signed)
Progress Note  Patient Name: Manuel Baker Date of Encounter: 01/06/2018  Primary Cardiologist: Kristeen Miss, MD   Subjective   No complaints, resting in bed.  Inpatient Medications    Scheduled Meds: . aspirin EC  325 mg Oral Daily  . atorvastatin  20 mg Oral q1800  . enoxaparin (LOVENOX) injection  40 mg Subcutaneous Q24H  . insulin aspart  0-9 Units Subcutaneous TID WC  . insulin glargine  30 Units Subcutaneous QHS  . levETIRAcetam  500 mg Oral BID   Continuous Infusions:  PRN Meds: acetaminophen **OR** acetaminophen (TYLENOL) oral liquid 160 mg/5 mL **OR** acetaminophen   Vital Signs    Vitals:   01/05/18 2200 01/06/18 0100 01/06/18 0601 01/06/18 0950  BP:  121/68 115/70 111/71  Pulse:  70 72 83  Resp:  18 18 18   Temp:  98.4 F (36.9 C) 98.2 F (36.8 C) 97.8 F (36.6 C)  TempSrc:  Oral Oral Oral  SpO2:  97% 97% 95%  Weight: 123 lb 0.3 oz (55.8 kg)     Height: 5\' 6"  (1.676 m)       Intake/Output Summary (Last 24 hours) at 01/06/2018 1003 Last data filed at 01/05/2018 2200 Gross per 24 hour  Intake 720 ml  Output -  Net 720 ml   Filed Weights   01/03/18 0443 01/05/18 2200  Weight: 123 lb (55.8 kg) 123 lb 0.3 oz (55.8 kg)    Telemetry    sinus - Personally Reviewed  ECG    No new tracings - Personally Reviewed  Physical Exam   GEN: No acute distress.   Neck: No JVD Cardiac: RRR, no murmurs, rubs, or gallops.  Respiratory: Clear to auscultation bilaterally. GI: Soft, nontender, non-distended  MS: No edema; No deformity. Neuro:  Nonfocal  Psych: Normal affect   Labs    Chemistry Recent Labs  Lab 01/02/18 1828 01/03/18 0621 01/04/18 0446  NA 139 130* 137  K 3.9 4.3 3.9  CL 102 101 109  CO2 24 17* 19*  GLUCOSE 255* 522* 204*  BUN 10 11 13   CREATININE 1.16 1.30* 1.03  CALCIUM 9.4 8.4* 8.5*  PROT  --  5.5*  --   ALBUMIN  --  3.1*  --   AST  --  72*  --   ALT  --  39  --   ALKPHOS  --  88  --   BILITOT  --  1.2  --   GFRNONAA  >60 >60 >60  GFRAA >60 >60 >60  ANIONGAP 13 12 9      Hematology Recent Labs  Lab 01/02/18 1828 01/03/18 0621  WBC 13.1* 8.0  RBC 5.96* 5.21  HGB 17.3* 14.4  HCT 48.4 41.9  MCV 81.2 80.4  MCH 29.0 27.6  MCHC 35.7 34.4  RDW 13.4 13.4  PLT 265 269    Cardiac EnzymesNo results for input(s): TROPONINI in the last 168 hours. No results for input(s): TROPIPOC in the last 168 hours.   BNPNo results for input(s): BNP, PROBNP in the last 168 hours.   DDimer No results for input(s): DDIMER in the last 168 hours.   Radiology    Vas [place] Lower Extremity Venous (dvt)  Result Date: 01/05/2018  Lower Venous Study Indication: Stroke with ASD. Examination Guidelines: A complete evaluation includes B-mode imaging, spectral doppler, color doppler, and power doppler as needed of all accessible portions of each vessel. Bilateral testing is considered an integral part of a complete examination. Limited examinations for reoccurring indications  may be performed as noted. The reflux portion of the exam is performed with the patient in reverse Trendelenburg.  Right Venous Findings: +---------+---------------+---------+-----------+----------+-------+          CompressibilityPhasicitySpontaneityPropertiesSummary +---------+---------------+---------+-----------+----------+-------+ CFV      Full           Yes      Yes                          +---------+---------------+---------+-----------+----------+-------+ FV Prox  Full                                                 +---------+---------------+---------+-----------+----------+-------+ FV Mid   Full                                                 +---------+---------------+---------+-----------+----------+-------+ FV DistalFull                                                 +---------+---------------+---------+-----------+----------+-------+ PFV      Full                                                  +---------+---------------+---------+-----------+----------+-------+ POP      Full           Yes      Yes                          +---------+---------------+---------+-----------+----------+-------+ PTV      Full                                                 +---------+---------------+---------+-----------+----------+-------+ PERO     Full                                                 +---------+---------------+---------+-----------+----------+-------+  Left Venous Findings: +---------+---------------+---------+-----------+----------+-------+          CompressibilityPhasicitySpontaneityPropertiesSummary +---------+---------------+---------+-----------+----------+-------+ CFV      Full           Yes      Yes                          +---------+---------------+---------+-----------+----------+-------+ FV Prox  Full                                                 +---------+---------------+---------+-----------+----------+-------+ FV Mid   Full                                                 +---------+---------------+---------+-----------+----------+-------+  FV DistalFull                                                 +---------+---------------+---------+-----------+----------+-------+ PFV      Full                                                 +---------+---------------+---------+-----------+----------+-------+ POP      Full           Yes      Yes                          +---------+---------------+---------+-----------+----------+-------+ PTV      Full                                                 +---------+---------------+---------+-----------+----------+-------+ PERO     Full                                                 +---------+---------------+---------+-----------+----------+-------+    Final Interpretation: Right: There is no evidence of deep vein thrombosis in the lower extremity. No cystic structure found in  the popliteal fossa. Left: There is no evidence of deep vein thrombosis in the lower extremity. No cystic structure found in the popliteal fossa.  *See table(s) above for measurements and observations. Electronically signed by Gretta Beganodd Early on 01/05/2018 at 2:10:59 PM.     Cardiac Studies   TEE today  Echo 01/04/18: Study Conclusions  - Left ventricle: The cavity size was normal. Wall thickness was normal. Systolic function was normal. The estimated ejection fraction was in the range of 50% to 55%. 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 Interestingly the septal insertion is currently not apically displaced Cannot r/o small residual ASD. - Right atrium: The atrium was severely dilated. - Atrial septum: A septal defect cannot be excluded. - Tricuspid valve: There was severe regurgitation.    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  Assessment & Plan    1. Ebstein's anomaly He remains euvolemic on exam. He is scheduled for TEE today. He has no complaints.  2. Embolic strokes TEE today to eval for ASD. Question need for loop recorder if negative ASD.        For questions or updates, please contact [facility] Please consult [email or web address] for contact info under Cardiology/STEMI.      Signed, Roe Rutherfordngela Nicole Odaly Peri, PA  01/06/2018, 10:03 AM

## 2018-01-06 NOTE — Progress Notes (Signed)
Inpatient Diabetes Program Recommendations  AACE/ADA: New Consensus Statement on Inpatient Glycemic Control (2015)  Target Ranges:  Prepandial:   less than 140 mg/dL      Peak postprandial:   less than 180 mg/dL (1-2 hours)      Critically ill patients:  140 - 180 mg/dL   Review of Glycemic Control  Diabetes history: DM 1 Home DM Meds: Lantus 30 units QHS                             Humalog 10-30 units TID  Current Insulin Orders: Lantus 30 units QHS                                       Novolog Sensitive Correction Scale/ SSI (0-9 units) TID AC                                          MD- Please consider the following in-hospital insulin adjustments:  Patient will need insulin coverage for meals. Consider starting Novolog Meal Coverage 4 units TID with meals (hold if pt eats <50% of meal)  Please give pt Rxs for Lantus and Humalog insulin pens at time of d/c.  Pt will also need Insulin pen needles: Lantus pen- Order # [id number] Humalog pen- Order # [id number] Insulin pen needles- Order # [id number]  Thanks,  Christena DeemShannon Theresea Trautmann RN, MSN, [facility] Inpatient Diabetes Coordinator Team Pager [phone] (8a-5p)

## 2018-01-06 NOTE — Anesthesia Procedure Notes (Signed)
Procedure Name: General with mask airway Date/Time: 01/06/2018 11:40 AM Performed by: Elliot DallyHuggins, Sindia Kowalczyk, CRNA Pre-anesthesia Checklist: Patient identified, Emergency Drugs available, Suction available, Patient being monitored and Timeout performed Patient Re-evaluated:Patient Re-evaluated prior to induction Oxygen Delivery Method: Simple face mask Preoxygenation: Pre-oxygenation with 100% oxygen

## 2018-01-07 ENCOUNTER — Encounter (HOSPITAL_COMMUNITY): Payer: Self-pay | Admitting: Cardiovascular Disease

## 2018-01-07 DIAGNOSIS — I513 Intracardiac thrombosis, not elsewhere classified: Secondary | ICD-10-CM

## 2018-01-07 DIAGNOSIS — I76 Septic arterial embolism: Secondary | ICD-10-CM

## 2018-01-07 DIAGNOSIS — Z8669 Personal history of other diseases of the nervous system and sense organs: Secondary | ICD-10-CM

## 2018-01-07 DIAGNOSIS — I236 Thrombosis of atrium, auricular appendage, and ventricle as current complications following acute myocardial infarction: Secondary | ICD-10-CM

## 2018-01-07 LAB — GLUCOSE, CAPILLARY
GLUCOSE-CAPILLARY: 166 mg/dL — AB (ref 65–99)
GLUCOSE-CAPILLARY: 303 mg/dL — AB (ref 65–99)
GLUCOSE-CAPILLARY: 80 mg/dL (ref 65–99)
Glucose-Capillary: 48 mg/dL — ABNORMAL LOW (ref 65–99)
Glucose-Capillary: 56 mg/dL — ABNORMAL LOW (ref 65–99)
Glucose-Capillary: 176 mg/dL — ABNORMAL HIGH (ref 65–99)

## 2018-01-07 LAB — CBC
HCT: 45.8 % (ref 39.0–52.0)
Hemoglobin: 16.1 g/dL (ref 13.0–17.0)
MCH: 28.4 pg (ref 26.0–34.0)
MCHC: 35.2 g/dL (ref 30.0–36.0)
MCV: 80.9 fL (ref 78.0–100.0)
PLATELETS: 247 10*3/uL (ref 150–400)
RBC: 5.66 MIL/uL (ref 4.22–5.81)
RDW: 13.5 % (ref 11.5–15.5)
WBC: 6.8 10*3/uL (ref 4.0–10.5)

## 2018-01-07 LAB — ANTINUCLEAR ANTIBODIES, IFA: ANA Ab, IFA: NEGATIVE

## 2018-01-07 LAB — PROTIME-INR
INR: 1.1
PROTHROMBIN TIME: 14.2 s (ref 11.4–15.2)

## 2018-01-07 LAB — PROTEIN C, TOTAL: Protein C, Total: 69 % (ref 60–150)

## 2018-01-07 LAB — HEPARIN LEVEL (UNFRACTIONATED)
HEPARIN UNFRACTIONATED: 0.19 [IU]/mL — AB (ref 0.30–0.70)
Heparin Unfractionated: 0.3 IU/mL (ref 0.30–0.70)

## 2018-01-07 MED ORDER — VANCOMYCIN HCL 500 MG IV SOLR
500.0000 mg | Freq: Three times a day (TID) | INTRAVENOUS | Status: DC
  Administered 2018-01-07 – 2018-01-09 (×6): 500 mg via INTRAVENOUS
  Filled 2018-01-07 (×7): qty 500

## 2018-01-07 MED ORDER — SODIUM CHLORIDE 0.9 % IV SOLN
2.0000 g | INTRAVENOUS | Status: DC
Start: 2018-01-08 — End: 2018-01-09
  Administered 2018-01-08: 2 g via INTRAVENOUS
  Filled 2018-01-07 (×2): qty 20

## 2018-01-07 MED ORDER — ONDANSETRON HCL 4 MG/2ML IJ SOLN
4.0000 mg | Freq: Once | INTRAMUSCULAR | Status: DC
  Filled 2018-01-07: qty 2

## 2018-01-07 MED ORDER — WARFARIN SODIUM 5 MG PO TABS
5.0000 mg | ORAL_TABLET | Freq: Once | ORAL | Status: AC
  Administered 2018-01-07: 5 mg via ORAL
  Filled 2018-01-07: qty 1

## 2018-01-07 MED ORDER — SODIUM CHLORIDE 0.9 % IV SOLN
2.0000 g | INTRAVENOUS | Status: DC
  Administered 2018-01-07: 2 g via INTRAVENOUS
  Filled 2018-01-07: qty 20

## 2018-01-07 MED ORDER — DEXTROSE 50 % IV SOLN: 1.0000 | Freq: Once | INTRAVENOUS | Status: DC

## 2018-01-07 MED ORDER — VANCOMYCIN HCL IN DEXTROSE 1-5 GM/200ML-% IV SOLN
1000.0000 mg | Freq: Once | INTRAVENOUS | Status: DC
  Filled 2018-01-07: qty 200

## 2018-01-07 NOTE — Progress Notes (Signed)
Pharmacy Antibiotic Note  Manuel Baker is a 22 y.o. male admitted on 01/02/2018 with possible seizure, found to have cardioembolic stroke, and TEE positive for endocarditis. Vancomycin is started earlier today, now to add rocephin. Blood cultures are negative so far.  Plan: Rocephin 2g IV Q 24 hrs as ordered F/u cultures  Height: 5\' 6"  (167.6 cm) Weight: 123 lb 0.3 oz (55.8 kg) IBW/kg (Calculated) : 63.8  Temp (24hrs), Avg:98.3 F (36.8 C), Min:97.8 F (36.6 C), Max:98.6 F (37 C)  Recent Labs  Lab 01/02/18 1828 01/03/18 0621 01/04/18 0446 01/06/18 0954 01/07/18 0554  WBC 13.1* 8.0  --   --  6.8  CREATININE 1.16 1.30* 1.03 0.90  --     Estimated Creatinine Clearance: 103.3 mL/min (by C-G formula based on SCr of 0.9 mg/dL).    No Known Allergies   Thank you for allowing pharmacy to be a part of this patient's care.  Bayard HuggerMei Petrice Beedy, PharmD, BCPS  Clinical Pharmacist  Pager: [phone]   01/07/2018 3:43 PM

## 2018-01-07 NOTE — Progress Notes (Signed)
ANTICOAGULATION CONSULT NOTE  Pharmacy Consult:  Heparin Indication:  Stroke, RA with large thrombus burden  No Known Allergies  Patient Measurements: Height: 5\' 6"  (167.6 cm) Weight: 123 lb 0.3 oz (55.8 kg) IBW/kg (Calculated) : 63.8 Heparin Dosing Weight: 55 kg  Vital Signs: Temp: 98.1 F (36.7 C) (02/13 2136) Temp Source: Oral (02/13 2136) BP: 120/83 (02/13 2136) Pulse Rate: 111 (02/13 2136)  Labs: Recent Labs    01/06/18 0954 01/06/18 2023 01/07/18 0554 01/07/18 1852  HGB  --   --  16.1  --   HCT  --   --  45.8  --   PLT  --   --  247  --   LABPROT 13.8  --  14.2  --   INR 1.07  --  1.10  --   HEPARINUNFRC  --  0.22* 0.30 0.19*  CREATININE 0.90  --   --   --    Estimated Creatinine Clearance: 103.3 mL/min (by C-G formula based on SCr of 0.9 mg/dL).  Assessment: 22 yo male admitted with new stroke, found with severe RV failure, RA enlargement with large thrombus burden.  Pharmacy asked to manage IV heparin bridge to Coumadin. INR 1.1 this morning following first dose of warfarin  Heparin level therapeutic this morning: 0.30 on heparin drip 7.5units/hr HL rechecked tonight fell 0.19 < goal.      Goal of Therapy:  Heparin level 0.3-0.5 units/mL INR: 2-3 Monitor platelets by anticoagulation protocol: Yes    Plan:  Increase heparin gtt at 900 units/hr Warfarin 5mg  x1 tonight Daily CBC and heparin level Monitor for s/sx of bleeding Leota SauersLisa Kainoa Swoboda Pharm.D. CPP, BCPS Clinical Pharmacist [phone] 01/07/2018 9:56 PM

## 2018-01-07 NOTE — Progress Notes (Signed)
Pharmacy Antibiotic Note  Manuel Baker [name] is a 22 y.o. male admitted on 01/02/2018 with bacteremia.  Pharmacy has been consulted for vancomycin dosing. Patient is being treated for suspected endocarditis and will need a total of 6 weeks therapy.  Plan: Vancomycin 1000mg  x1 then 500mg  IV every 8 hours.  Goal trough 15-20 mcg/mL. Monitor clinical progression and order troughs as appropriate.  Height: 5\' 6"  (167.6 cm) Weight: 123 lb 0.3 oz (55.8 kg) IBW/kg (Calculated) : 63.8  Temp (24hrs), Avg:98.2 F (36.8 C), Min:97.8 F (36.6 C), Max:98.6 F (37 C)  Recent Labs  Lab 01/02/18 1828 01/03/18 0621 01/04/18 0446 01/06/18 0954 01/07/18 0554  WBC 13.1* 8.0  --   --  6.8  CREATININE 1.16 1.30* 1.03 0.90  --     Estimated Creatinine Clearance: 103.3 mL/min (by C-G formula based on SCr of 0.9 mg/dL).    No Known Allergies   Thank you for allowing pharmacy to be a part of this patient's care.  Toniann Failony L Akshar Starnes 01/07/2018 1:43 PM

## 2018-01-07 NOTE — Progress Notes (Addendum)
ANTICOAGULATION CONSULT NOTE  Pharmacy Consult:  Heparin Indication:  Stroke, RA with large thrombus burden  No Known Allergies  Patient Measurements: Height: 5\' 6"  (167.6 cm) Weight: 123 lb 0.3 oz (55.8 kg) IBW/kg (Calculated) : 63.8 Heparin Dosing Weight: 55 kg  Vital Signs: Temp: 97.8 F (36.6 C) (02/13 0602) Temp Source: Oral (02/13 0602) BP: 116/63 (02/13 0602) Pulse Rate: 73 (02/13 0602)  Labs: Recent Labs    01/06/18 0954 01/06/18 2023 01/07/18 0554  HGB  --   --  16.1  HCT  --   --  45.8  PLT  --   --  247  LABPROT 13.8  --  14.2  INR 1.07  --  1.10  HEPARINUNFRC  --  0.22* 0.30  CREATININE 0.90  --   --    Estimated Creatinine Clearance: 103.3 mL/min (by C-G formula based on SCr of 0.9 mg/dL).  Assessment: 22 yo male admitted with new stroke, found with severe RV failure, RA enlargement with large thrombus burden.  Pharmacy asked to manage IV heparin bridge to Coumadin. INR 1.1 this morning following first dose of warfarin  Heparin level therapeutic this morning: 0.30; would typically increase rate to prevent subtherapeutic level, but given high risk of adverse bleeding event will order confirmatory level and monitor closely. No infusion issues for overt bleeding reported.   Goal of Therapy:  Heparin level 0.3-0.5 units/mL INR: 2-3 Monitor platelets by anticoagulation protocol: Yes    Plan:  Continue heparin gtt at 750 units/hr Warfarin 5mg  x1 tonight Confirmatory level later today Daily CBC and heparin level Monitor for s/sx of bleeding  Ruben Imony Chelsie Burel, PharmD Clinical Pharmacist 01/07/2018 9:52 AM

## 2018-01-07 NOTE — Progress Notes (Signed)
 Progress Note  Patient Name: Manuel Baker Date of Encounter: 01/07/2018  Primary Cardiologist: Philip Nahser, MD   Subjective   Feeling well.  Denies chest pain or shortness of breath.  No exertional symptoms.    Inpatient Medications    Scheduled Meds: . atorvastatin  20 mg Oral q1800  . insulin aspart  0-9 Units Subcutaneous TID WC  . insulin glargine  30 Units Subcutaneous QHS  . levETIRAcetam  500 mg Oral BID  . sodium chloride flush  10-40 mL Intracatheter Q12H  . Warfarin - Pharmacist Dosing Inpatient   Does not apply q1800   Continuous Infusions: . heparin 750 Units/hr (01/06/18 2156)   PRN Meds: acetaminophen **OR** acetaminophen (TYLENOL) oral liquid 160 mg/5 mL **OR** acetaminophen, sodium chloride flush   Vital Signs    Vitals:   01/06/18 1736 01/06/18 2104 01/07/18 0125 01/07/18 0602  BP: 113/66 103/65 134/82 116/63  Pulse: 74 92 77 73  Resp: 20 18 16 16  Temp: 98.4 F (36.9 C) 98.4 F (36.9 C) 98.4 F (36.9 C) 97.8 F (36.6 C)  TempSrc: Oral Oral Oral Oral  SpO2: 96% 95% 97% 97%  Weight:      Height:        Intake/Output Summary (Last 24 hours) at 01/07/2018 0949 Last data filed at 01/07/2018 0300 Gross per 24 hour  Intake 1095.96 ml  Output 400 ml  Net 695.96 ml   Filed Weights   01/03/18 0443 01/05/18 2200  Weight: 123 lb (55.8 kg) 123 lb 0.3 oz (55.8 kg)    Telemetry    Sinus rhythm.  PVCs.  No events - Personally Reviewed  ECG   01/03/18 Sinus rhythm. Rate 72 bpm.  First degree AV block.  RBBB.  Prior septal infarct.     - Personally Reviewed  Physical Exam   VS:  BP 116/63 (BP Location: Right Arm)   Pulse 73   Temp 97.8 F (36.6 C) (Oral)   Resp 16   Ht 5' 6" (1.676 m)   Wt 123 lb 0.3 oz (55.8 kg)   SpO2 97%   BMI 19.86 kg/m  , BMI Body mass index is 19.86 kg/m. GENERAL:  Well appearing.  No acute distress HEENT: Pupils equal round and reactive, fundi not visualized, oral mucosa unremarkable NECK:  + jugular venous  distention, waveform within normal limits, carotid upstroke brisk and symmetric, no bruits, no thyromegaly LUNGS:  Clear to auscultation bilaterally.  No crackles, wheezes or rhonchi HEART:  RRR.  PMI not displaced or sustained,S1 and S2 within normal limits, no S3, no S4, no clicks, no rubs, no murmurs ABD:  Flat, positive bowel sounds normal in frequency in pitch, no bruits, no rebound, no guarding, no midline pulsatile mass, no hepatomegaly, no splenomegaly EXT:  2 plus pulses throughout, no edema, no cyanosis no clubbing SKIN:  No rashes no nodules NEURO:  Cranial nerves II through XII grossly intact, motor grossly intact throughout PSYCH:  Cognitively intact, oriented to person place and time   Labs    Chemistry Recent Labs  Lab 01/03/18 0621 01/04/18 0446 01/06/18 0954  NA 130* 137 141  K 4.3 3.9 3.7  CL 101 109 111  CO2 17* 19* 21*  GLUCOSE 522* 204* 77  BUN 11 13 11  CREATININE 1.30* 1.03 0.90  CALCIUM 8.4* 8.5* 9.2  PROT 5.5*  --   --   ALBUMIN 3.1*  --   --   AST 72*  --   --     ALT 39  --   --   ALKPHOS 88  --   --   BILITOT 1.2  --   --   GFRNONAA >60 >60 >60  GFRAA >60 >60 >60  ANIONGAP 12 9 9     Hematology Recent Labs  Lab 01/02/18 1828 01/03/18 0621 01/07/18 0554  WBC 13.1* 8.0 6.8  RBC 5.96* 5.21 5.66  HGB 17.3* 14.4 16.1  HCT 48.4 41.9 45.8  MCV 81.2 80.4 80.9  MCH 29.0 27.6 28.4  MCHC 35.7 34.4 35.2  RDW 13.4 13.4 13.5  PLT 265 269 247    Cardiac EnzymesNo results for input(s): TROPONINI in the last 168 hours. No results for input(s): TROPIPOC in the last 168 hours.   BNPNo results for input(s): BNP, PROBNP in the last 168 hours.   DDimer No results for input(s): DDIMER in the last 168 hours.   Radiology    Dg Orthopantogram  Result Date: 01/06/2018 CLINICAL DATA:  22-year-old presenting with acute endocarditis. Evaluate dentition. EXAM: ORTHOPANTOGRAM/PANORAMIC COMPARISON:  None. FINDINGS: Incompletely erupted third molars on both  sides of the mandible. No visible dental disease. No periapical lucencies involving any of the teeth. IMPRESSION: Incompletely erupted third molars on both sides of the mandible. Otherwise negative examination. Electronically Signed   By: Diedrich  Lawrence M.D.   On: 01/06/2018 16:53    Cardiac Studies   Echo 01/04/18: Study Conclusions  - Left ventricle: The cavity size was normal. Wall thickness was   normal. Systolic function was normal. The estimated ejection   fraction was in the range of 50% to 55%. 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 Interestingly the septal insertion   is currently not apically displaced Cannot r/o small residual   ASD. - Right atrium: The atrium was severely dilated. - Atrial septum: A septal defect cannot be excluded. - Tricuspid valve: There was severe regurgitation.  TEE 01/06/18: Study Conclusions  - Left ventricle: Systolic function was normal. The estimated   ejection fraction was in the range of 50% to 55%. - Left atrium: No evidence of thrombus in the atrial cavity or   appendage. - Right ventricle: The RV is severely dilated and hypokinetic - Right atrium: Large thrombus burden in dome of RA. The atrium was   massively dilated. - Atrial septum: There appears to be a large PFO or ASD. There is   markedly positive right to left shunting by bubble study   High likelyhood of paradoxic emboli caused TIA - Tricuspid valve: The patient is apparently post Ebsteins Anomaly   repair at a young age. However the septal leaflet is not apically   displaced. The TV is morphologically markedly abnormal. There   appears to be an annuloplasty ring. There is failure of leaflet   coaptation with wide open TR. The lateral leaflet is thickened   with likely 1 cm vegetation. There appears to be a large  thrombus   burden in the dome of the RA.  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  Assessment & Plan   # Ebstein's anomaly:  # RV failure: # ASD:  Attempted to get his surgical notes but they were not available from [facility].  Pre-op anestheia note reports that he was to have tricuspid valve   repair and ASD repair for Ebstein's.  Mr. Rueb is euvolemic and asymptomatic.  He denies any exertional symptoms.  I personally reviewed his TTE and TEE.  There is no displacement of the tricuspid valve, which makes it unclear that this is a true Ebstein's anomaly.  There appears to be a tricuspid annuloplasty ring and possibly surgical approximation of the tricuspid leaflets.  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  There is also significant shunting from right to left across an ASD.  He will need RHC to assess pulmonary pressure and Qp:Qs prior to surgery.  We will check ambulatory oxygen saturations today.  There is not enough of a rim for a surgical device and he requires tricuspid valve surgery anyway, so this much be repaired surgically.    I have contacted Dr. Jeani Hawking Ward (aldult congenital cardiology) at [facility] and she will arrange for him to be seen in clinic next week.  He would like to wait until this summer for surgery if possible.  He also has severe right ventricular systolic dysfunction.  His right ventricle was severely dilated and there is diastolic flattening of the interventricular septum, consistent with right ventricular volume overload.  Agree that he needs to be seen in adult congenital clinic for his failing RV.  There is no evidence of pre-exitation on his EKG.   # Embolic strokes: TEE revealed a large clot burden in the RA.  The posterior leaflet is also thickened and possibly has thrombus.  He has R-->L shunting across an ASD, which is the source of his stroke.  He has been started on heparin and warfarin.  Goal INR 2-3.  ESR  is 1, he is not anemic, and WBC is normal so endocarditis is unlikely.  Blood cultures are pending.   Adult congential clinic and subsequent surgical repair as above.   For questions or updates, please contact [place] Please consult [email or web address] for contact info under Cardiology/STEMI.      Signed, Skeet Latch, MD  01/07/2018, 9:49 AM

## 2018-01-07 NOTE — Progress Notes (Signed)
PROGRESS NOTE    Suanne MarkerCedric Stopper  ZOX:[id number] DOB: 09/27/1996 DOA: 01/02/2018 PCP: No primary care provider on file.   Brief Narrative: Suanne MarkerCedric Baker is a 22 y.o. 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 Patient presented after concern for seizure and found to have multiple strokes. Transesophageal Echocardiogram significant for ASD and ?vegetation. Venous duplex negative for DVT Blood culture pending.     Assessment & Plan: Acute stroke Thought secondary to cardiac source in setting of Ebstein's anomaly. Transthoracic Echocardiogram pending. Likely with ASD. LE venous dopplers negative for DVT. 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 vegetation neurology recommends continuing with therapeutic anticoagulation.  Seizure Patient with history. Not on antiepileptics as an outpatient -Neurology recommendations: Keppra -Seizure precautions -Outpatient follow-up with neurology  Diabetes mellitus, type 1, uncontrolled with hyperglycemia Hyperglycemic overnight. Hemoglobin A1C of 12.2%. -Continue Lantus 30 units daily -Continue SSI As hypoglycemia today poor p.o. intake this morning.  Monitor may require reduction in Lantus.  Ebstein's anomaly [facility] [place] per discussion with father. Partial repair 10 years ago. Per father, plan was to have second surgery at some point soon. -[facility]  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 -Blood culture pending -ID consulted, plan to start antibiotic after third set of culture.   DVT prophylaxis: Lovenox Code Status: Full code Family Communication: None at bedside Disposition Plan: Follow-up and further  workup.   Consultants:   Neurology/Stroke  Cardiology  Procedures:   LE venous duplex without DVT  Transthoracic Echocardiogram (01/04/2018) Study Conclusions  - Left ventricle: The cavity size was normal. Wall thickness was   normal. Systolic function was normal. The estimated ejection   fraction was in the range of 50% to 55%. 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 Interestingly the septal insertion   is currently not apically displaced Cannot r/o small residual   ASD. - Right atrium: The atrium was severely dilated. - Atrial septum: A septal defect cannot be excluded. - Tricuspid valve: There was severe regurgitation.  Antimicrobials:  None    Subjective: Denies any acute complaint.  Was tired this morning due to hypoglycemia.  Later on after correction of this glucose was able to ambulate.  No acute complaint no nausea no vomiting.  Objective: Vitals:   01/07/18 0125 01/07/18 0602 01/07/18 0955 01/07/18 1333  BP: 134/82 116/63 118/68 111/71  Pulse: 77 73 85 84  Resp: 16 16 18 18   Temp: 98.4 F (36.9 C) 97.8 F (36.6 C) 98.2 F (36.8 C) 98.6 F (37 C)  TempSrc: Oral Oral Oral Oral  SpO2: 97% 97% 97% 93%  Weight:      Height:        Intake/Output Summary (Last 24 hours) at 01/07/2018 1648 Last data filed at 01/07/2018 1100 Gross per 24 hour  Intake 695.96 ml  Output -  Net 695.96 ml   Filed Weights   01/03/18 0443 01/05/18 2200  Weight: 55.8 kg (123 lb) 55.8 kg (123 lb 0.3 oz)    Examination:  General exam: Appears calm and comfortable Respiratory system: Clear to auscultation  bilaterally. Unlabored work of breathing. No wheezing or rales. Cardiovascular: Regular rate and rhythm. Normal S1 and S2. No heart murmurs present. No extra heart sounds Gastroenterology: Soft, non-tender,  non-distended, no guarding, no rebound, no masses felt Central nervous system: Alert and oriented. No focal deficits Psychiatry: Judgement and insight appear normal. Mood & affect appropriate.     Data Reviewed: I have personally reviewed following labs and imaging studies  CBC: Recent Labs  Lab 01/02/18 1828 01/03/18 0621 01/07/18 0554  WBC 13.1* 8.0 6.8  HGB 17.3* 14.4 16.1  HCT 48.4 41.9 45.8  MCV 81.2 80.4 80.9  PLT 265 269 247   Basic Metabolic Panel: Recent Labs  Lab 01/02/18 1828 01/03/18 0621 01/04/18 0446 01/06/18 0954  NA 139 130* 137 141  K 3.9 4.3 3.9 3.7  CL 102 101 109 111  CO2 24 17* 19* 21*  GLUCOSE 255* 522* 204* 77  BUN 10 11 13 11   CREATININE 1.16 1.30* 1.03 0.90  CALCIUM 9.4 8.4* 8.5* 9.2   GFR: Estimated Creatinine Clearance: 103.3 mL/min (by C-G formula based on SCr of 0.9 mg/dL). Liver Function Tests: Recent Labs  Lab 01/03/18 0621  AST 72*  ALT 39  ALKPHOS 88  BILITOT 1.2  PROT 5.5*  ALBUMIN 3.1*   No results for input(s): LIPASE, AMYLASE in the last 168 hours. No results for input(s): AMMONIA in the last 168 hours. Coagulation Profile: Recent Labs  Lab 01/06/18 0954 01/07/18 0554  INR 1.07 1.10   Cardiac Enzymes: No results for input(s): CKTOTAL, CKMB, CKMBINDEX, TROPONINI in the last 168 hours. BNP (last 3 results) No results for input(s): PROBNP in the last 8760 hours. HbA1C: No results for input(s): HGBA1C in the last 72 hours. CBG: Recent Labs  Lab 01/06/18 2149 01/07/18 0736 01/07/18 1139 01/07/18 1159 01/07/18 1301  GLUCAP 113* 166* 48* 56* 176*   Lipid Profile: No results for input(s): CHOL, HDL, LDLCALC, TRIG, CHOLHDL, LDLDIRECT in the last 72 hours. Thyroid Function Tests: No results for input(s): TSH, T4TOTAL, FREET4, T3FREE, THYROIDAB in the last 72 hours. Anemia Panel: No results for input(s): VITAMINB12, FOLATE, FERRITIN, TIBC, IRON, RETICCTPCT in the last 72 hours. Sepsis Labs: No results for  input(s): PROCALCITON, LATICACIDVEN in the last 168 hours.  Recent Results (from the past 240 hour(s))  Culture, blood (routine x 2)     Status: None (Preliminary result)   Collection Time: 01/06/18  2:32 PM  Result Value Ref Range Status   Specimen Description BLOOD LEFT ANTECUBITAL  Final   Special Requests   Final    BLOOD AEROBIC BOTTLE Blood Culture results may not be optimal due to an inadequate volume of blood received in culture bottles   Culture   Final    NO GROWTH < 24 HOURS Performed at [facility], [address], [place], [place] [address]    Report Status PENDING  Incomplete  Culture, blood (routine x 2)     Status: None (Preliminary result)   Collection Time: 01/06/18  2:38 PM  Result Value Ref Range Status   Specimen Description BLOOD LEFT HAND  Final   Special Requests   Final    BLOOD AEROBIC BOTTLE Blood Culture results may not be optimal due to an inadequate volume of blood received in culture bottles   Culture   Final    NO GROWTH < 24 HOURS Performed at [facility], [address] [place], [place] [address]    Report Status PENDING  Incomplete  Radiology Studies: Vas [place] Lower Extremity Venous (dvt)  Result Date: 01/05/2018  Lower Venous Study Indication: Stroke with ASD. Examination Guidelines: A complete evaluation includes B-mode imaging, spectral doppler, color doppler, and power doppler as needed of all accessible portions of each vessel. Bilateral testing is considered an integral part of a complete examination. Limited examinations for reoccurring indications may be performed as noted. The reflux portion of the exam is performed with the patient in reverse Trendelenburg.  Right Venous Findings: +---------+---------------+---------+-----------+----------+-------+          CompressibilityPhasicitySpontaneityPropertiesSummary +---------+---------------+---------+-----------+----------+-------+ CFV      Full           Yes       Yes                          +---------+---------------+---------+-----------+----------+-------+ FV Prox  Full                                                 +---------+---------------+---------+-----------+----------+-------+ FV Mid   Full                                                 +---------+---------------+---------+-----------+----------+-------+ FV DistalFull                                                 +---------+---------------+---------+-----------+----------+-------+ PFV      Full                                                 +---------+---------------+---------+-----------+----------+-------+ POP      Full           Yes      Yes                          +---------+---------------+---------+-----------+----------+-------+ PTV      Full                                                 +---------+---------------+---------+-----------+----------+-------+ PERO     Full                                                 +---------+---------------+---------+-----------+----------+-------+  Left Venous Findings: +---------+---------------+---------+-----------+----------+-------+          CompressibilityPhasicitySpontaneityPropertiesSummary +---------+---------------+---------+-----------+----------+-------+ CFV      Full           Yes      Yes                          +---------+---------------+---------+-----------+----------+-------+ FV Prox  Full                                                 +---------+---------------+---------+-----------+----------+-------+  FV Mid   Full                                                 +---------+---------------+---------+-----------+----------+-------+ FV DistalFull                                                 +---------+---------------+---------+-----------+----------+-------+ PFV      Full                                                  +---------+---------------+---------+-----------+----------+-------+ POP      Full           Yes      Yes                          +---------+---------------+---------+-----------+----------+-------+ PTV      Full                                                 +---------+---------------+---------+-----------+----------+-------+ PERO     Full                                                 +---------+---------------+---------+-----------+----------+-------+    Final Interpretation: Right: There is no evidence of deep vein thrombosis in the lower extremity. No cystic structure found in the popliteal fossa. Left: There is no evidence of deep vein thrombosis in the lower extremity. No cystic structure found in the popliteal fossa.  *See table(s) above for measurements and observations. Electronically signed by Gretta Began on 01/05/2018 at 2:10:59 PM.   Final        Scheduled Meds: . atorvastatin  20 mg Oral q1800  . dextrose  1 ampule Intravenous Once  . insulin aspart  0-9 Units Subcutaneous TID WC  . insulin glargine  30 Units Subcutaneous QHS  . levETIRAcetam  500 mg Oral BID  . sodium chloride flush  10-40 mL Intracatheter Q12H  . warfarin  5 mg Oral ONCE-1800  . Warfarin - Pharmacist Dosing Inpatient   Does not apply q1800   Continuous Infusions: . cefTRIAXone (ROCEPHIN)  IV    . heparin 750 Units/hr (01/06/18 2156)  . vancomycin    . vancomycin       LOS: 4 days     Lynden Oxford, MD [facility] 01/07/2018, 4:48 PM  If 7PM-7AM, please contact night-coverage [email or web address] Password [facility] 01/07/2018, 4:48 PM

## 2018-01-07 NOTE — Progress Notes (Signed)
Subjective: No fevers, chills, nausea, vomiting.  Antibiotics:  Anti-infectives (From admission, onward)   None      Medications: Scheduled Meds: . atorvastatin  20 mg Oral q1800  . dextrose  1 ampule Intravenous Once  . insulin aspart  0-9 Units Subcutaneous TID WC  . insulin glargine  30 Units Subcutaneous QHS  . levETIRAcetam  500 mg Oral BID  . sodium chloride flush  10-40 mL Intracatheter Q12H  . warfarin  5 mg Oral ONCE-1800  . Warfarin - Pharmacist Dosing Inpatient   Does not apply q1800   Continuous Infusions: . heparin 750 Units/hr (01/06/18 2156)   PRN Meds:.acetaminophen **OR** acetaminophen (TYLENOL) oral liquid 160 mg/5 mL **OR** acetaminophen, sodium chloride flush  Objective: Weight change:   Intake/Output Summary (Last 24 hours) at 01/07/2018 1257 Last data filed at 01/07/2018 1100 Gross per 24 hour  Intake 695.96 ml  Output -  Net 695.96 ml   Blood pressure 118/68, pulse 85, temperature 98.2 F (36.8 C), temperature source Oral, resp. rate 18, height 5\' 6"  (1.676 m), weight 123 lb 0.3 oz (55.8 kg), SpO2 97 %. Temp:  [97.8 F (36.6 C)-98.4 F (36.9 C)] 98.2 F (36.8 C) (02/13 0955) Pulse Rate:  [73-92] 85 (02/13 0955) Resp:  [16-20] 18 (02/13 0955) BP: (103-134)/(63-82) 118/68 (02/13 0955) SpO2:  [95 %-97 %] 97 % (02/13 0955)  Physical Exam: Gen: NAD CV: RRR, no M/R/G Resp: CTAB, no increased work of breathing Abd: Soft, NDNT, +BS Ext: moves all 4 freely Skin: no rash  CBC: CBC Latest Ref Rng & Units 01/07/2018 01/03/2018 01/02/2018  WBC 4.0 - 10.5 K/uL 6.8 8.0 13.1(H)  Hemoglobin 13.0 - 17.0 g/dL 62.9 52.8 17.3(H)  Hematocrit 39.0 - 52.0 % 45.8 41.9 48.4  Platelets 150 - 400 K/uL 247 269 265    BMET Recent Labs    01/06/18 0954  NA 141  K 3.7  CL 111  CO2 21*  GLUCOSE 77  BUN 11  CREATININE 0.90  CALCIUM 9.2   Liver Panel  No results for input(s): PROT, ALBUMIN, AST, ALT, ALKPHOS, BILITOT, BILIDIR, IBILI in the last  72 hours.   Sedimentation Rate Recent Labs    01/06/18 1424  ESRSEDRATE 1   C-Reactive Protein No results for input(s): CRP in the last 72 hours.  Micro Results: No results found for this or any previous visit (from the past 720 hour(s)).  Studies/Results: Dg Orthopantogram  Result Date: 01/06/2018 CLINICAL DATA:  22 year old presenting with acute endocarditis. Evaluate dentition. EXAM: ORTHOPANTOGRAM/PANORAMIC COMPARISON:  None. FINDINGS: Incompletely erupted third molars on both sides of the mandible. No visible dental disease. No periapical lucencies involving any of the teeth. IMPRESSION: Incompletely erupted third molars on both sides of the mandible. Otherwise negative examination. Electronically Signed   By: Hulan Saas M.D.   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 male with PMH of Ebstein's anomaly with ?partial repair, T1DM, h/o seizures admitted for possible seizure and found to have multiple bil punctate cardioembolic infarcts w/o residual deficits; TEE  shows 1cm TV vegetation, significant right atrial clot burden with atrial dilation, PFO vs ASD. Patient has had no fevers, no infectious symptoms or risk factors for IE. 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   Orthopantogram w/o abscess.   Plan: 1. F/u blood cultures - plan to get 3 complete sets prior to starting antibiotics - 2 already drawn, will follow up with phlebotomy to ensure 3rd set is done 2. Will  then plan on starting ceftriaxone and vanc; plan on 6wk course   LOS: 4 days   Nyra MarketGorica Nikeya Maxim 01/07/2018, 12:57 PM

## 2018-01-07 NOTE — Progress Notes (Signed)
   INFECTIOUS DISEASE ATTENDING ADDENDUM:   This patient has been seen and discussed with the house staff. Please see the resident's note for complete details. I have reviewed the pertinent  laboratory and radiographic data and examined the patient independently.  I concur with their findings with the following additions/corrections:  Was sitting in the bed comfortable today in no acute distress  Cardiovascular exam exam remains unchanged and there is no new focal neurological findings.  A second set of blood cultures has been taken this morning and we have ordered another one which hopefully will be drawn soon.  After his third set of blood cultures have been taken he should be started on vancomycin and ceftriaxone.  Presuming that the cultures did not give us a target for antimicrobial therapy he will need to have a 6-week course of vancomycin and ceftriaxone.  I spent greater than 35 minutes with the patient including greater than 50% of time in face to face counsel of the patient and his father who is at the bedside with regards the nature of infectious and noninfectious endocarditis consequent is of not treating with antimicrobials if this is indeed an infectious endocarditis, nature of septic emboli and in coordination of his care.   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  Culture Result: No growth  No Known Allergies  OPAT Orders Discharge antibiotics: Ceftriaxone 2 g IV daily and vancomycin per pharmacy protocol  Aim for Vancomycin trough 15-20 (unless otherwise indicated) Duration: 6 weeks End Date: March 26th, 2019  [facility] Protocol:  BIWEEKLY LABS while on IV antibiotics:  _x_ BMP w GFR  WEEKLY LABS while on IV antibiotics: _x_ CBC with differential  x__ Vancomycin trough  x__ Please pull PIC at completion of IV antibiotics __ Please leave PIC in place until doctor has seen patient or been notified  Fax weekly labs  to [phone]) [phone]  Clinic Follow Up Appt:  4 weeks.   NOTE DO NOT PLACE PICC UNTIL WE have 3rd set of abx and have started IV abx

## 2018-01-08 DIAGNOSIS — E7211 Homocystinuria: Secondary | ICD-10-CM

## 2018-01-08 LAB — CBC
HEMATOCRIT: 44 % (ref 39.0–52.0)
HEMOGLOBIN: 15.6 g/dL (ref 13.0–17.0)
MCH: 28.5 pg (ref 26.0–34.0)
MCHC: 35.5 g/dL (ref 30.0–36.0)
MCV: 80.4 fL (ref 78.0–100.0)
Platelets: 239 10*3/uL (ref 150–400)
RBC: 5.47 MIL/uL (ref 4.22–5.81)
RDW: 13.4 % (ref 11.5–15.5)
WBC: 6.6 10*3/uL (ref 4.0–10.5)

## 2018-01-08 LAB — GLUCOSE, CAPILLARY
GLUCOSE-CAPILLARY: 219 mg/dL — AB (ref 65–99)
GLUCOSE-CAPILLARY: 68 mg/dL (ref 65–99)
GLUCOSE-CAPILLARY: 221 mg/dL — AB (ref 65–99)
Glucose-Capillary: 70 mg/dL (ref 65–99)

## 2018-01-08 LAB — PROTHROMBIN GENE MUTATION

## 2018-01-08 LAB — PROTIME-INR
INR: 1.15
Prothrombin Time: 14.6 seconds (ref 11.4–15.2)

## 2018-01-08 LAB — FACTOR 5 LEIDEN

## 2018-01-08 LAB — HEPARIN LEVEL (UNFRACTIONATED): HEPARIN UNFRACTIONATED: 0.5 [IU]/mL (ref 0.30–0.70)

## 2018-01-08 MED ORDER — ENOXAPARIN (LOVENOX) PATIENT EDUCATION KIT
PACK | Freq: Once | Status: AC
  Administered 2018-01-08: 12:00:00
  Filled 2018-01-08: qty 1

## 2018-01-08 MED ORDER — ENOXAPARIN SODIUM 60 MG/0.6ML ~~LOC~~ SOLN
55.0000 mg | Freq: Two times a day (BID) | SUBCUTANEOUS | Status: DC
  Administered 2018-01-08 (×2): 55 mg via SUBCUTANEOUS
  Filled 2018-01-08 (×3): qty 0.55

## 2018-01-08 MED ORDER — LEVETIRACETAM 500 MG PO TABS: 500.0000 mg | ORAL_TABLET | Freq: Two times a day (BID) | ORAL | 0 refills | Status: DC

## 2018-01-08 MED ORDER — SODIUM CHLORIDE 0.9% FLUSH
10.0000 mL | Freq: Two times a day (BID) | INTRAVENOUS | Status: DC
  Administered 2018-01-08 – 2018-01-09 (×2): 10 mL

## 2018-01-08 MED ORDER — SODIUM CHLORIDE 0.9% FLUSH
10.0000 mL | INTRAVENOUS | Status: DC | PRN
  Administered 2018-01-08: 10 mL
  Filled 2018-01-08: qty 40

## 2018-01-08 MED ORDER — ATORVASTATIN CALCIUM 20 MG PO TABS: 20.0000 mg | ORAL_TABLET | Freq: Every day | ORAL | 0 refills | Status: DC

## 2018-01-08 MED ORDER — ENOXAPARIN SODIUM 80 MG/0.8ML ~~LOC~~ SOLN: 80.0000 mg | SUBCUTANEOUS | 0 refills | Status: DC

## 2018-01-08 MED ORDER — WARFARIN SODIUM 7.5 MG PO TABS
7.5000 mg | ORAL_TABLET | Freq: Once | ORAL | Status: AC
  Administered 2018-01-08: 7.5 mg via ORAL
  Filled 2018-01-08: qty 1

## 2018-01-08 MED ORDER — WARFARIN SODIUM 5 MG PO TABS: 5.0000 mg | ORAL_TABLET | Freq: Every day | ORAL | 0 refills | Status: DC

## 2018-01-08 NOTE — Progress Notes (Signed)
ANTICOAGULATION CONSULT NOTE  Pharmacy Consult for IV Heparin Indication:  Stroke, RA with large thrombus burden  No Known Allergies  Patient Measurements: Height: 5\' 6"  (167.6 cm) Weight: 123 lb 0.3 oz (55.8 kg) IBW/kg (Calculated) : 63.8 Heparin Dosing Weight: 55 kg  Vital Signs: Temp: 98 F (36.7 C) (02/14 0235) Temp Source: Oral (02/14 0235) BP: 121/59 (02/14 0235) Pulse Rate: 81 (02/14 0235)  Labs: Recent Labs    01/06/18 0954  01/07/18 0554 01/07/18 1852 01/08/18 0410  HGB  --   --  16.1  --  15.6  HCT  --   --  45.8  --  44.0  PLT  --   --  247  --  239  LABPROT 13.8  --  14.2  --   --   INR 1.07  --  1.10  --   --   HEPARINUNFRC  --    < > 0.30 0.19* 0.50  CREATININE 0.90  --   --   --   --    < > = values in this interval not displayed.   Estimated Creatinine Clearance: 103.3 mL/min (by C-G formula based on SCr of 0.9 mg/dL).  Assessment: 22 yo male admitted with new stroke, found with severe RV failure, RA enlargement with large thrombus burden.  Pharmacy asked to manage IV heparin bridge to Coumadin.   2/14 AM: heparin level therapeutic x 1 after rate increase  Goal of Therapy:  Heparin level 0.3-0.5 units/mL INR: 2-3 Monitor platelets by anticoagulation protocol: Yes    Plan:  Cont heparin at 900 units/hr 1200 HL  Manuel Baker, PharmD, BCPS Clinical Pharmacist Phone: [phone]

## 2018-01-08 NOTE — Discharge Instructions (Signed)
Endocrinology- [phone] Dr. Elvera Lennox Dr. Alyson Locket Endocrinology- [phone] Dr. Clemon Chambers Endocrinology- [phone] Manuel Baker, Manuel Baker Dr. Allena Katz Dr. Katrinka Blazing  Information on my medicine - Coumadin   (Warfarin)  This medication education was reviewed with me or my healthcare representative as part of my discharge preparation.    Why was Coumadin prescribed for you? Coumadin was prescribed for you because you have a blood clot or a medical condition that can cause an increased risk of forming blood clots. Blood clots can cause serious health problems by blocking the flow of blood to the heart, lung, or brain. Coumadin can prevent harmful blood clots from forming. As a reminder your indication for Coumadin is:   Blood Clotting Disorder  What test will check on my response to Coumadin? While on Coumadin (warfarin) you will need to have an INR test regularly to ensure that your dose is keeping you in the desired range. The INR (international normalized ratio) number is calculated from the result of the laboratory test called prothrombin time (PT).  If an INR APPOINTMENT HAS NOT ALREADY BEEN MADE FOR YOU please schedule an appointment to have this lab work done by your health care provider within 7 days. Your INR goal is usually a number between:  2 to 3 or your provider may give you a more narrow range like 2-2.5.  Ask your health care provider during an office visit what your goal INR is.  What  do you need to  know  About  COUMADIN? Take Coumadin (warfarin) exactly as prescribed by your healthcare provider about the same time each day.  DO NOT stop taking without talking to the doctor who prescribed the medication.  Stopping without other blood clot prevention medication to take the place of Coumadin may increase your risk of developing a new clot or stroke.  Get refills before you run out.  What do you do if you miss a dose? If you miss a dose, take it as soon as  you remember on the same day then continue your regularly scheduled regimen the next day.  Do not take two doses of Coumadin at the same time.  Important Safety Information A possible side effect of Coumadin (Warfarin) is an increased risk of bleeding. You should call your healthcare provider right away if you experience any of the following: ? Bleeding from an injury or your nose that does not stop. ? Unusual colored urine (red or dark brown) or unusual colored stools (red or black). ? Unusual bruising for unknown reasons. ? A serious fall or if you hit your head (even if there is no bleeding).  Some foods or medicines interact with Coumadin (warfarin) and might alter your response to warfarin. To help avoid this: ? Eat a balanced diet, maintaining a consistent amount of Vitamin K. ? Notify your provider about major diet changes you plan to make. ? Avoid alcohol or limit your intake to 1 drink for women and 2 drinks for men per day. (1 drink is 5 oz. wine, 12 oz. beer, or 1.5 oz. liquor.)  Make sure that ANY health care provider who prescribes medication for you knows that you are taking Coumadin (warfarin).  Also make sure the healthcare provider who is monitoring your Coumadin knows when you have started a new medication including herbals and non-prescription products.  Coumadin (Warfarin)  Major Drug Interactions  Increased Warfarin Effect Decreased Warfarin Effect  Alcohol (large quantities) Antibiotics (esp. Septra/Bactrim, Flagyl, Cipro) Amiodarone (Cordarone) Aspirin (  ASA) Cimetidine (Tagamet) Megestrol (Megace) NSAIDs (ibuprofen, naproxen, etc.) Piroxicam (Feldene) Propafenone (Rythmol SR) Propranolol (Inderal) Isoniazid (INH) Posaconazole (Noxafil) Barbiturates (Phenobarbital) Carbamazepine (Tegretol) Chlordiazepoxide (Librium) Cholestyramine (Questran) Griseofulvin Oral Contraceptives Rifampin Sucralfate (Carafate) Vitamin K   Coumadin (Warfarin) Major Herbal  Interactions  Increased Warfarin Effect Decreased Warfarin Effect  Garlic Ginseng Ginkgo biloba Coenzyme Q10 Green tea St. Johns wort    Coumadin (Warfarin) FOOD Interactions  Eat a consistent number of servings per week of foods HIGH in Vitamin K (1 serving =  cup)  Collards (cooked, or boiled & drained) Kale (cooked, or boiled & drained) Mustard greens (cooked, or boiled & drained) Parsley *serving size only =  cup Spinach (cooked, or boiled & drained) Swiss chard (cooked, or boiled & drained) Turnip greens (cooked, or boiled & drained)  Eat a consistent number of servings per week of foods MEDIUM-HIGH in Vitamin K (1 serving = 1 cup)  Asparagus (cooked, or boiled & drained) Broccoli (cooked, boiled & drained, or raw & chopped) Brussel sprouts (cooked, or boiled & drained) *serving size only =  cup Lettuce, raw (green leaf, endive, romaine) Spinach, raw Turnip greens, raw & chopped   These websites have more information on Coumadin (warfarin):  http://www.king-russell.com/www.coumadin.com; https://www.hines.net/www.ahrq.gov/consumer/coumadin.htm;

## 2018-01-08 NOTE — Progress Notes (Signed)
Progress Note  Patient Name: Manuel Baker Date of Encounter: 01/08/2018  Primary Cardiologist: Mertie Moores, MD   Subjective   Feeling well.  Denies chest pain or shortness of breath.  No exertional symptoms.    Inpatient Medications    Scheduled Meds: . atorvastatin  20 mg Oral q1800  . dextrose  1 ampule Intravenous Once  . insulin aspart  0-9 Units Subcutaneous TID WC  . insulin glargine  30 Units Subcutaneous QHS  . levETIRAcetam  500 mg Oral BID  . ondansetron (ZOFRAN) IV  4 mg Intravenous Once  . sodium chloride flush  10-40 mL Intracatheter Q12H  . Warfarin - Pharmacist Dosing Inpatient   Does not apply q1800   Continuous Infusions: . cefTRIAXone (ROCEPHIN)  IV    . heparin 900 Units/hr (01/07/18 2209)  . vancomycin 500 mg (01/08/18 0630)  . vancomycin     PRN Meds: acetaminophen **OR** acetaminophen (TYLENOL) oral liquid 160 mg/5 mL **OR** acetaminophen, sodium chloride flush   Vital Signs    Vitals:   01/07/18 1732 01/07/18 2136 01/08/18 0235 01/08/18 0510  BP: 122/73 120/83 (!) 121/59 (!) 100/56  Pulse: 85 (!) 111 81 76  Resp: '18 18 18 18  ' Temp: 98.3 F (36.8 C) 98.1 F (36.7 C) 98 F (36.7 C) 98 F (36.7 C)  TempSrc: Oral Oral Oral Oral  SpO2: 95% 93% 97% 95%  Weight:      Height:        Intake/Output Summary (Last 24 hours) at 01/08/2018 0841 Last data filed at 01/08/2018 0411 Gross per 24 hour  Intake 657.93 ml  Output -  Net 657.93 ml   Filed Weights   01/03/18 0443 01/05/18 2200  Weight: 123 lb (55.8 kg) 123 lb 0.3 oz (55.8 kg)    Telemetry    Sinus rhythm.  No events - Personally Reviewed  ECG   01/03/18 Sinus rhythm. Rate 72 bpm.  First degree AV block.  RBBB.  Prior septal infarct.     - Personally Reviewed  Physical Exam   VS:  BP (!) 100/56 (BP Location: Right Arm)   Pulse 76   Temp 98 F (36.7 C) (Oral)   Resp 18   Ht '5\' 6"'  (1.676 m)   Wt 123 lb 0.3 oz (55.8 kg)   SpO2 95%   BMI 19.86 kg/m  , BMI Body mass index is  19.86 kg/m. GENERAL:  Well appearing.  No acute distress HEENT: Pupils equal round and reactive, fundi not visualized, oral mucosa unremarkable NECK:  + jugular venous distention, waveform within normal limits, carotid upstroke brisk and symmetric, no bruits, no thyromegaly LUNGS:  Clear to auscultation bilaterally.  No crackles, wheezes or rhonchi HEART:  RRR.  PMI not displaced or sustained,S1 and S2 within normal limits, no S3, no S4, no clicks, no rubs, no murmurs ABD:  Flat, positive bowel sounds normal in frequency in pitch, no bruits, no rebound, no guarding, no midline pulsatile mass, no hepatomegaly, no splenomegaly EXT:  2 plus pulses throughout, no edema, no cyanosis no clubbing SKIN:  No rashes no nodules NEURO:  Cranial nerves II through XII grossly intact, motor grossly intact throughout [facility]:  Cognitively intact, oriented to person place and time   Labs    Chemistry Recent Labs  Lab 01/03/18 0621 01/04/18 0446 01/06/18 0954  NA 130* 137 141  K 4.3 3.9 3.7  CL 101 109 111  CO2 17* 19* 21*  GLUCOSE 522* 204* 77  BUN 11 13  11  CREATININE 1.30* 1.03 0.90  CALCIUM 8.4* 8.5* 9.2  PROT 5.5*  --   --   ALBUMIN 3.1*  --   --   AST 72*  --   --   ALT 39  --   --   ALKPHOS 88  --   --   BILITOT 1.2  --   --   GFRNONAA >60 >60 >60  GFRAA >60 >60 >60  ANIONGAP '12 9 9     ' Hematology Recent Labs  Lab 01/03/18 0621 01/07/18 0554 01/08/18 0410  WBC 8.0 6.8 6.6  RBC 5.21 5.66 5.47  HGB 14.4 16.1 15.6  HCT 41.9 45.8 44.0  MCV 80.4 80.9 80.4  MCH 27.6 28.4 28.5  MCHC 34.4 35.2 35.5  RDW 13.4 13.5 13.4  PLT 269 247 239    Cardiac EnzymesNo results for input(s): TROPONINI in the last 168 hours. No results for input(s): TROPIPOC in the last 168 hours.   BNPNo results for input(s): BNP, PROBNP in the last 168 hours.   DDimer No results for input(s): DDIMER in the last 168 hours.   Radiology    Dg Orthopantogram  Result Date: 01/06/2018 CLINICAL DATA:   22 year old presenting with acute endocarditis. Evaluate dentition. EXAM: ORTHOPANTOGRAM/PANORAMIC COMPARISON:  None. FINDINGS: Incompletely erupted third molars on both sides of the mandible. No visible dental disease. No periapical lucencies involving any of the teeth. IMPRESSION: Incompletely erupted third molars on both sides of the mandible. Otherwise negative examination. Electronically Signed   By: Evangeline Dakin M.D.   On: 01/06/2018 16:53    Cardiac Studies   Echo 01/04/18: Study Conclusions  - Left ventricle: The cavity size was normal. Wall thickness was   normal. Systolic function was normal. The estimated ejection   fraction was in the range of 50% to 55%. 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 Interestingly the septal insertion   is currently not apically displaced Cannot r/o small residual   ASD. - Right atrium: The atrium was severely dilated. - Atrial septum: A septal defect cannot be excluded. - Tricuspid valve: There was severe regurgitation.  TEE 01/06/18: Study Conclusions  - Left ventricle: Systolic function was normal. The estimated   ejection fraction was in the range of 50% to 55%. - Left atrium: No evidence of thrombus in the atrial cavity or   appendage. - Right ventricle: The RV is severely dilated and hypokinetic - Right atrium: Large thrombus burden in dome of RA. The atrium was   massively dilated. - Atrial septum: There appears to be a large PFO or ASD. There is   markedly positive right to left shunting by bubble study   High likelyhood of paradoxic emboli caused TIA - Tricuspid valve: The patient is apparently post Ebsteins Anomaly   repair at a young age. However the septal leaflet is not apically   displaced. The TV is morphologically markedly abnormal. 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 There is failure of leaflet   coaptation with wide open TR. The lateral leaflet is thickened   with likely 1 cm vegetation. There appears to be a large thrombus   burden in the dome of the RA.  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  Assessment &  Plan   # Ebstein's anomaly:  # RV failure: # ASD:  Operative report reviewed from 06/11/2008.  He had repair of Epstein's anomaly with a [facility] procedure and partial closure of a secundum ASD.   Both a PFO and secundum ASD were closed with sutures.  His RV function was poor at that time and there was mild-moderate TR even after the valve was repaired.  His CVP was still elevated so the suture closing the PFO was partially reopened to allow some R-->L flow.  Mr. Friesen is euvolemic and asymptomatic.  He denies any exertional symptoms.  I personally reviewed his TTE and TEE.  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  There is also significant shunting from right to left across an ASD.  He will need RHC to assess pulmonary pressure and Qp:Qs prior to surgical closure.   We will check ambulatory oxygen saturations today.  There is not enough of a rim for a surgical device and he requires tricuspid valve surgery anyway, so this much be repaired surgically.   I have contacted Dr. Jeani Hawking Ward (aldult congenital cardiology) at [facility] and she will arrange for him to be seen in clinic next week.  He would like to wait until this summer for surgery if possible.  He also has severe right ventricular systolic dysfunction.  His right ventricle was severely dilated and hypokinetic.  There is no evidence of pre-exitation on his EKG.   # Embolic strokes: TEE revealed a large clot burden in the RA.  The posterior leaflet is also thickened and possibly has thrombus.  He has R-->L shunting across an ASD, which is the source of his stroke.  He has been started on heparin and warfarin.  OK to switch to  weight based lovenox.  We can see him in coumadin clinic on Monday.  Goal INR 2-3.  ESR is 1, he is not anemic, and WBC is normal so endocarditis is unlikely.  Blood cultures are pending.   Adult congential clinic and subsequent surgical repair as above.   Dispo: OK for discharge today (on lovenox and warfarin) from a cardiac standpoint.  For questions or updates, please contact [place] Please consult [email or web address] for contact info under Cardiology/STEMI.      Signed, Skeet Latch, MD  01/08/2018, 8:41 AM

## 2018-01-08 NOTE — Progress Notes (Signed)
[facility]  [facility].  [facility] for home IV ABX at DC.  [facility] Coord[facility] will provide hands on teaching with pt and parents prior to DC home to support independence at home.  If patient discharges after hours, please call [phone].   Sedalia Mutaamela S Chandler 01/08/2018, 2:09 PM

## 2018-01-08 NOTE — Progress Notes (Signed)
Peripherally Inserted Central Catheter/Midline Placement  The IV Nurse has discussed with the patient and/or persons authorized to consent for the patient, the purpose of this procedure and the potential benefits and risks involved with this procedure.  The benefits include less needle sticks, lab draws from the catheter, and the patient may be discharged home with the catheter. Risks include, but not limited to, infection, bleeding, blood clot (thrombus formation), and puncture of an artery; nerve damage and irregular heartbeat and possibility to perform a PICC exchange if needed/ordered by physician.  Alternatives to this procedure were also discussed.  Bard Power PICC patient education guide, fact sheet on infection prevention and patient information card has been provided to patient /or left at bedside.    PICC/Midline Placement Documentation  PICC Single Lumen 01/08/18 PICC Right Brachial 42 cm 2 cm (Active)  Indication for Insertion or Continuance of Line Home intravenous therapies (PICC only) 01/08/2018  6:00 PM  Exposed Catheter (cm) 2 cm 01/08/2018  6:00 PM  Site Assessment Clean;Dry;Intact 01/08/2018  6:00 PM  Line Status Flushed;Blood return noted 01/08/2018  6:00 PM  Dressing Type Transparent 01/08/2018  6:00 PM  Dressing Status Clean;Dry;Intact;Antimicrobial disc in place 01/08/2018  6:00 PM  Dressing Change Due 01/15/18 01/08/2018  6:00 PM       Manuel Baker, Manuel Baker 01/08/2018, 7:01 PM

## 2018-01-08 NOTE — Progress Notes (Signed)
PROGRESS NOTE    Manuel Baker  ZOX:[id number] DOB: January 15, 1996 DOA: 01/02/2018 PCP: No primary care provider on file.   Brief Narrative: Manuel Baker is a 22 y.o. 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 Patient presented after concern for seizure and found to have multiple strokes. Transesophageal Echocardiogram significant for ASD and ?vegetation. Venous duplex negative for DVT Blood culture pending.     Assessment & Plan: Acute stroke Thought secondary to cardiac source in setting of Ebstein's anomaly. Transthoracic Echocardiogram pending. Likely with ASD. LE venous dopplers negative for DVT. 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 vegetation neurology recommends continuing with therapeutic anticoagulation. Switch to Lovenox daily with warfarin.  Seizure Patient with history. Not on antiepileptics as an outpatient -Neurology recommendations: Keppra -Seizure precautions -Outpatient follow-up with neurology  Diabetes mellitus, type 1, uncontrolled with hyperglycemia Hyperglycemic overnight. Hemoglobin A1C of 12.2%. -Continue Lantus 30 units daily -Continue SSI  Ebstein's anomaly Patient receives care at [facility], in [place] per discussion with father. Partial repair 10 years ago. Per father, plan was to have second surgery at some point soon. -Cardiology recommends follow-up at [facility].  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 -Blood culture pending -ID consulted, plan to vancomycin and ceftriaxone for 6 weeks.  PICC line ordered.   DVT prophylaxis: Lovenox Code Status: Full code Family Communication: None at bedside Disposition Plan: Follow-up and further workup.   Consultants:    Neurology/Stroke  Cardiology  Procedures:   LE venous duplex without DVT  Transthoracic Echocardiogram (01/04/2018) Study Conclusions  - Left ventricle: The cavity size was normal. Wall thickness was   normal. Systolic function was normal. The estimated ejection   fraction was in the range of 50% to 55%. 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 Interestingly the septal insertion   is currently not apically displaced Cannot r/o small residual   ASD. - Right atrium: The atrium was severely dilated. - Atrial septum: A septal defect cannot be excluded. - Tricuspid valve: There was severe regurgitation.  Antimicrobials:  None    Subjective: No acute complaint.  Hoping to go home soon.  No nausea no vomiting.  Objective: Vitals:   01/08/18 0510 01/08/18 1000 01/08/18 1410 01/08/18 1730  BP: (!) 100/56 116/67 115/65 (!) 106/54  Pulse: 76 81 84 80  Resp: 18 18  18   Temp: 98 F (36.7 C) 97.7 F (36.5 C) 98.5 F (36.9 C) 98.4 F (36.9 C)  TempSrc: Oral Oral Oral Oral  SpO2: 95% 97% 100% 99%  Weight:      Height:        Intake/Output Summary (Last 24 hours) at 01/08/2018 1811 Last data filed at 01/08/2018 0411 Gross per 24 hour  Intake 297.93 ml  Output -  Net 297.93 ml   Filed Weights   01/03/18 0443 01/05/18 2200  Weight: 55.8 kg (123 lb) 55.8 kg (123 lb 0.3 oz)    Examination:  General exam: Appears calm and comfortable Respiratory system: Clear to auscultation bilaterally. Unlabored work of breathing. No wheezing or rales. Cardiovascular: Regular rate and rhythm. Normal S1 and S2. No heart murmurs present. No  extra heart sounds Gastroenterology: Soft, non-tender, non-distended, no guarding, no rebound, no masses felt Central nervous system: Alert and oriented. No focal deficits Psychiatry:  Judgement and insight appear normal. Mood & affect appropriate.     Data Reviewed: I have personally reviewed following labs and imaging studies  CBC: Recent Labs  Lab 01/02/18 1828 01/03/18 0621 01/07/18 0554 01/08/18 0410  WBC 13.1* 8.0 6.8 6.6  HGB 17.3* 14.4 16.1 15.6  HCT 48.4 41.9 45.8 44.0  MCV 81.2 80.4 80.9 80.4  PLT 265 269 247 239   Basic Metabolic Panel: Recent Labs  Lab 01/02/18 1828 01/03/18 0621 01/04/18 0446 01/06/18 0954  NA 139 130* 137 141  K 3.9 4.3 3.9 3.7  CL 102 101 109 111  CO2 24 17* 19* 21*  GLUCOSE 255* 522* 204* 77  BUN 10 11 13 11   CREATININE 1.16 1.30* 1.03 0.90  CALCIUM 9.4 8.4* 8.5* 9.2   GFR: Estimated Creatinine Clearance: 103.3 mL/min (by C-G formula based on SCr of 0.9 mg/dL). Liver Function Tests: Recent Labs  Lab 01/03/18 0621  AST 72*  ALT 39  ALKPHOS 88  BILITOT 1.2  PROT 5.5*  ALBUMIN 3.1*   No results for input(s): LIPASE, AMYLASE in the last 168 hours. No results for input(s): AMMONIA in the last 168 hours. Coagulation Profile: Recent Labs  Lab 01/06/18 0954 01/07/18 0554 01/08/18 0410  INR 1.07 1.10 1.15   Cardiac Enzymes: No results for input(s): CKTOTAL, CKMB, CKMBINDEX, TROPONINI in the last 168 hours. BNP (last 3 results) No results for input(s): PROBNP in the last 8760 hours. HbA1C: No results for input(s): HGBA1C in the last 72 hours. CBG: Recent Labs  Lab 01/07/18 1648 01/07/18 2133 01/08/18 0648 01/08/18 1147 01/08/18 1625  GLUCAP 303* 80 219* 70 68   Lipid Profile: No results for input(s): CHOL, HDL, LDLCALC, TRIG, CHOLHDL, LDLDIRECT in the last 72 hours. Thyroid Function Tests: No results for input(s): TSH, T4TOTAL, FREET4, T3FREE, THYROIDAB in the last 72 hours. Anemia Panel: No results for input(s): VITAMINB12, FOLATE, FERRITIN, TIBC, IRON, RETICCTPCT in the last 72 hours. Sepsis Labs: No results for input(s): PROCALCITON, LATICACIDVEN in the last 168 hours.  Recent Results (from  the past 240 hour(s))  Culture, blood (routine x 2)     Status: None (Preliminary result)   Collection Time: 01/06/18  2:32 PM  Result Value Ref Range Status   Specimen Description BLOOD LEFT ANTECUBITAL  Final   Special Requests   Final    BLOOD AEROBIC BOTTLE Blood Culture results may not be optimal due to an inadequate volume of blood received in culture bottles   Culture   Final    NO GROWTH 2 DAYS Performed at [facility], [address] [place], [place] [address]    Report Status PENDING  Incomplete  Culture, blood (routine x 2)     Status: None (Preliminary result)   Collection Time: 01/06/18  2:38 PM  Result Value Ref Range Status   Specimen Description BLOOD LEFT HAND  Final   Special Requests   Final    BLOOD AEROBIC BOTTLE Blood Culture results may not be optimal due to an inadequate volume of blood received in culture bottles   Culture   Final    NO GROWTH 2 DAYS Performed at [facility], [address] [place], [place] [address]    Report Status PENDING  Incomplete  Culture, blood (routine x 2)     Status: None (Preliminary result)  Collection Time: 01/07/18  5:55 AM  Result Value Ref Range Status   Specimen Description BLOOD LEFT ANTECUBITAL  Final   Special Requests IN PEDIATRIC BOTTLE Blood Culture adequate volume  Final   Culture   Final    NO GROWTH 1 DAY Performed at [facility], [address], [place], [place] [address]    Report Status PENDING  Incomplete  Culture, blood (routine x 2)     Status: None (Preliminary result)   Collection Time: 01/07/18  6:00 AM  Result Value Ref Range Status   Specimen Description BLOOD LEFT HAND  Final   Special Requests IN PEDIATRIC BOTTLE Blood Culture adequate volume  Final   Culture   Final    NO GROWTH 1 DAY Performed at [facility], [address] [place], [place] [address]    Report Status PENDING  Incomplete  Culture, blood (routine x 2)     Status: None (Preliminary result)    Collection Time: 01/07/18  1:30 PM  Result Value Ref Range Status   Specimen Description BLOOD RIGHT ANTECUBITAL  Final   Special Requests   Final    BOTTLES DRAWN AEROBIC AND ANAEROBIC Blood Culture adequate volume   Culture   Final    NO GROWTH 1 DAY Performed at [facility], [address], [place], [place] [address]    Report Status PENDING  Incomplete         Radiology Studies: Vas [place] Lower Extremity Venous (dvt)  Result Date: 01/05/2018  Lower Venous Study Indication: Stroke with ASD. Examination Guidelines: A complete evaluation includes B-mode imaging, spectral doppler, color doppler, and power doppler as needed of all accessible portions of each vessel. Bilateral testing is considered an integral part of a complete examination. Limited examinations for reoccurring indications may be performed as noted. The reflux portion of the exam is performed with the patient in reverse Trendelenburg.  Right Venous Findings: +---------+---------------+---------+-----------+----------+-------+          CompressibilityPhasicitySpontaneityPropertiesSummary +---------+---------------+---------+-----------+----------+-------+ CFV      Full           Yes      Yes                          +---------+---------------+---------+-----------+----------+-------+ FV Prox  Full                                                 +---------+---------------+---------+-----------+----------+-------+ FV Mid   Full                                                 +---------+---------------+---------+-----------+----------+-------+ FV DistalFull                                                 +---------+---------------+---------+-----------+----------+-------+ PFV      Full                                                 +---------+---------------+---------+-----------+----------+-------+  POP      Full           Yes      Yes                           +---------+---------------+---------+-----------+----------+-------+ PTV      Full                                                 +---------+---------------+---------+-----------+----------+-------+ PERO     Full                                                 +---------+---------------+---------+-----------+----------+-------+  Left Venous Findings: +---------+---------------+---------+-----------+----------+-------+          CompressibilityPhasicitySpontaneityPropertiesSummary +---------+---------------+---------+-----------+----------+-------+ CFV      Full           Yes      Yes                          +---------+---------------+---------+-----------+----------+-------+ FV Prox  Full                                                 +---------+---------------+---------+-----------+----------+-------+ FV Mid   Full                                                 +---------+---------------+---------+-----------+----------+-------+ FV DistalFull                                                 +---------+---------------+---------+-----------+----------+-------+ PFV      Full                                                 +---------+---------------+---------+-----------+----------+-------+ POP      Full           Yes      Yes                          +---------+---------------+---------+-----------+----------+-------+ PTV      Full                                                 +---------+---------------+---------+-----------+----------+-------+ PERO     Full                                                 +---------+---------------+---------+-----------+----------+-------+  Final Interpretation: Right: There is no evidence of deep vein thrombosis in the lower extremity. No cystic structure found in the popliteal fossa. Left: There is no evidence of deep vein thrombosis in the lower extremity. No cystic structure found in the popliteal  fossa.  *See table(s) above for measurements and observations. Electronically signed by Gretta Began on 01/05/2018 at 2:10:59 PM.   Final        Scheduled Meds: . atorvastatin  20 mg Oral q1800  . dextrose  1 ampule Intravenous Once  . enoxaparin (LOVENOX) injection  55 mg Subcutaneous BID  . insulin aspart  0-9 Units Subcutaneous TID WC  . insulin glargine  30 Units Subcutaneous QHS  . levETIRAcetam  500 mg Oral BID  . ondansetron (ZOFRAN) IV  4 mg Intravenous Once  . sodium chloride flush  10-40 mL Intracatheter Q12H  . Warfarin - Pharmacist Dosing Inpatient   Does not apply q1800   Continuous Infusions: . cefTRIAXone (ROCEPHIN)  IV    . vancomycin Stopped (01/08/18 1720)  . vancomycin       LOS: 5 days     Lynden Oxford, MD [facility] 01/08/2018, 6:11 PM  If 7PM-7AM, please contact night-coverage [email or web address] Password [facility] 01/08/2018, 6:11 PM

## 2018-01-08 NOTE — Progress Notes (Signed)
Plan is for patient to d/c back to school with SQ lovenox and IV antibiotics. He is scheduled for PICC line placement today.  CM spoke to patient, his dad and step mother. CM provided choice of HH agencies and they selected [facility]. [facility]. The parents will be back at 3 pm to the hospital. [facility] made aware and she will meet with them at this time.  CM submitted for benefits check for lovenox 80 mg daily. Awaiting results.  [facility]. Information on the AVS.

## 2018-01-08 NOTE — Progress Notes (Signed)
ANTICOAGULATION CONSULT NOTE  Pharmacy Consult for IV Heparin Indication:  Stroke, RA with large thrombus burden  No Known Allergies  Patient Measurements: Height: 5\' 6"  (167.6 cm) Weight: 123 lb 0.3 oz (55.8 kg) IBW/kg (Calculated) : 63.8 Heparin Dosing Weight: 55 kg  Vital Signs: Temp: 97.7 F (36.5 C) (02/14 1000) Temp Source: Oral (02/14 1000) BP: 116/67 (02/14 1000) Pulse Rate: 81 (02/14 1000)  Labs: Recent Labs    01/06/18 0954  01/07/18 0554 01/07/18 1852 01/08/18 0410  HGB  --   --  16.1  --  15.6  HCT  --   --  45.8  --  44.0  PLT  --   --  247  --  239  LABPROT 13.8  --  14.2  --  14.6  INR 1.07  --  1.10  --  1.15  HEPARINUNFRC  --    < > 0.30 0.19* 0.50  CREATININE 0.90  --   --   --   --    < > = values in this interval not displayed.   Estimated Creatinine Clearance: 103.3 mL/min (by C-G formula based on SCr of 0.9 mg/dL).  Assessment: 22 yo male admitted with new stroke, found with severe RV failure, RA enlargement with large thrombus burden.  Pharmacy asked to manage IV heparin bridge to Coumadin.   2/14 AM: heparin level at upper end of therapeutic goal: 0.50; Spoke with RN to stop heparin infusion prior to giving first Lovenox injection. Patient will be discharged on warfarin with Lovenox bridge.  Goal of Therapy:  INR: 2-3 Monitor platelets by anticoagulation protocol: Yes    Plan:  Warfarin 7.5mg  x1 tonight Lovenox 55mg  subcutaneous BID Monitor for s/sx of bleeding  Ruben Imony Corbett Moulder, PharmD Clinical Pharmacist 01/08/2018 10:19 AM

## 2018-01-08 NOTE — Progress Notes (Signed)
Subjective: Patient denies fevers, chills, nausea, vomiting, chest pain, shortness of breath.   Antibiotics:  Anti-infectives (From admission, onward)   Start     Dose/Rate Route Frequency Ordered Stop   01/08/18 2100  cefTRIAXone (ROCEPHIN) 2 g in sodium chloride 0.9 % 100 mL IVPB     2 g 200 mL/hr over 30 Minutes Intravenous Every 24 hours 01/07/18 2114     01/07/18 2200  vancomycin (VANCOCIN) 500 mg in sodium chloride 0.9 % 100 mL IVPB     500 mg 100 mL/hr over 60 Minutes Intravenous Every 8 hours 01/07/18 1341     01/07/18 1400  cefTRIAXone (ROCEPHIN) 2 g in sodium chloride 0.9 % 100 mL IVPB  Status:  Discontinued     2 g 200 mL/hr over 30 Minutes Intravenous Every 24 hours 01/07/18 1337 01/07/18 2114   01/07/18 1345  vancomycin (VANCOCIN) IVPB 1000 mg/200 mL premix     1,000 mg 200 mL/hr over 60 Minutes Intravenous  Once 01/07/18 1341        Medications: Scheduled Meds: . atorvastatin  20 mg Oral q1800  . dextrose  1 ampule Intravenous Once  . enoxaparin (LOVENOX) injection  55 mg Subcutaneous BID  . insulin aspart  0-9 Units Subcutaneous TID WC  . insulin glargine  30 Units Subcutaneous QHS  . levETIRAcetam  500 mg Oral BID  . ondansetron (ZOFRAN) IV  4 mg Intravenous Once  . sodium chloride flush  10-40 mL Intracatheter Q12H  . Warfarin - Pharmacist Dosing Inpatient   Does not apply q1800   Continuous Infusions: . cefTRIAXone (ROCEPHIN)  IV    . vancomycin 500 mg (01/08/18 0630)  . vancomycin     PRN Meds:.acetaminophen **OR** acetaminophen (TYLENOL) oral liquid 160 mg/5 mL **OR** acetaminophen, sodium chloride flush  Objective: Weight change:   Intake/Output Summary (Last 24 hours) at 01/08/2018 1014 Last data filed at 01/08/2018 0411 Gross per 24 hour  Intake 657.93 ml  Output -  Net 657.93 ml   Blood pressure 116/67, pulse 81, temperature 97.7 F (36.5 C), temperature source Oral, resp. rate 18, height 5\' 6"  (1.676 m), weight 123 lb 0.3 oz  (55.8 kg), SpO2 97 %. Temp:  [97.7 F (36.5 C)-98.6 F (37 C)] 97.7 F (36.5 C) (02/14 1000) Pulse Rate:  [76-111] 81 (02/14 1000) Resp:  [18] 18 (02/14 1000) BP: (100-122)/(56-83) 116/67 (02/14 1000) SpO2:  [93 %-97 %] 97 % (02/14 1000)  Physical Exam: Gen: NAD CV: RRR, no M/R/G Resp: CTAB, no increased work of breathing Abd: Soft, NDNT, +BS Ext: moves all 4 freely Skin: no rash  CBC: CBC Latest Ref Rng & Units 01/08/2018 01/07/2018 01/03/2018  WBC 4.0 - 10.5 K/uL 6.6 6.8 8.0  Hemoglobin 13.0 - 17.0 g/dL 32.4 40.1 02.7  Hematocrit 39.0 - 52.0 % 44.0 45.8 41.9  Platelets 150 - 400 K/uL 239 247 269    BMET Recent Labs    01/06/18 0954  NA 141  K 3.7  CL 111  CO2 21*  GLUCOSE 77  BUN 11  CREATININE 0.90  CALCIUM 9.2   Liver Panel  No results for input(s): PROT, ALBUMIN, AST, ALT, ALKPHOS, BILITOT, BILIDIR, IBILI in the last 72 hours.   Sedimentation Rate Recent Labs    01/06/18 1424  ESRSEDRATE 1   C-Reactive Protein No results for input(s): CRP in the last 72 hours.  Micro Results: Recent Results (from the past 720 hour(s))  Culture, blood (routine x 2)  Status: None (Preliminary result)   Collection Time: 01/06/18  2:32 PM  Result Value Ref Range Status   Specimen Description BLOOD LEFT ANTECUBITAL  Final   Special Requests   Final    BLOOD AEROBIC BOTTLE Blood Culture results may not be optimal due to an inadequate volume of blood received in culture bottles   Culture   Final    NO GROWTH < 24 HOURS Performed at [facility], [address] [place], [place] [address]    Report Status PENDING  Incomplete  Culture, blood (routine x 2)     Status: None (Preliminary result)   Collection Time: 01/06/18  2:38 PM  Result Value Ref Range Status   Specimen Description BLOOD LEFT HAND  Final   Special Requests   Final    BLOOD AEROBIC BOTTLE Blood Culture results may not be optimal due to an inadequate volume of blood received in culture bottles    Culture   Final    NO GROWTH < 24 HOURS Performed at [facility], [address] [place], [place] [address]    Report Status PENDING  Incomplete    Studies/Results: Dg Orthopantogram  Result Date: 01/06/2018 CLINICAL DATA:  22 year old presenting with acute endocarditis. Evaluate dentition. EXAM: ORTHOPANTOGRAM/PANORAMIC COMPARISON:  None. FINDINGS: Incompletely erupted third molars on both sides of the mandible. No visible dental disease. No periapical lucencies involving any of the teeth. IMPRESSION: Incompletely erupted third molars on both sides of the mandible. Otherwise negative examination. Electronically Signed   By: Hulan Saashomas  Lawrence M.D.   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 male with PMH of Ebstein's anomaly with ?partial repair, T1DM, h/o seizures admitted for possible seizure and found to have multiple bil punctate cardioembolic infarcts w/o residual deficits; TEE shows 1cm TV vegetation, significant right atrial clot burden with atrial dilation, PFO vs ASD. Patient has had no fevers, no infectious symptoms or risk factors for IE. 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   [facility] for eval for TV and PFO repair.   Hypercoag panel: elevated homocysteine which could be elevated acutely 2/2 infarcts; Factor 5 Leiden and Prothrombin gene pending, otherwise other labs negative.  Plan: 1. F/u blood  cultures 2. Vanc and Ceftriaxone 2g for empiric treatment of IE x6wks 3. F/u Q fever Ab, Bartonella ab, Brucella Ab, Legionella UAg   LOS: 5 days   Nyra MarketGorica Arless Vineyard  PGY - 2 Infectious Disease [phone] 01/08/2018, 10:14 AM

## 2018-01-09 LAB — CBC
HCT: 44.8 % (ref 39.0–52.0)
Hemoglobin: 15.3 g/dL (ref 13.0–17.0)
MCH: 28 pg (ref 26.0–34.0)
MCHC: 34.2 g/dL (ref 30.0–36.0)
MCV: 81.9 fL (ref 78.0–100.0)
PLATELETS: 258 10*3/uL (ref 150–400)
RBC: 5.47 MIL/uL (ref 4.22–5.81)
RDW: 13.8 % (ref 11.5–15.5)
WBC: 5.6 10*3/uL (ref 4.0–10.5)

## 2018-01-09 LAB — GLUCOSE, CAPILLARY
GLUCOSE-CAPILLARY: 63 mg/dL — AB (ref 65–99)
GLUCOSE-CAPILLARY: 92 mg/dL (ref 65–99)
Glucose-Capillary: 242 mg/dL — ABNORMAL HIGH (ref 65–99)

## 2018-01-09 LAB — Q FEVER ANTIBODIES, IGG
Q FEVER PHASE I: NEGATIVE
Q FEVER PHASE II: NEGATIVE

## 2018-01-09 LAB — LEGIONELLA PNEUMOPHILA SEROGP 1 UR AG: L. PNEUMOPHILA SEROGP 1 UR AG: NEGATIVE

## 2018-01-09 LAB — PROTIME-INR
INR: 1.16
PROTHROMBIN TIME: 14.7 s (ref 11.4–15.2)

## 2018-01-09 LAB — BARTONELLA ANITBODY PANEL: B HENSELAE IGG: NEGATIVE {titer}

## 2018-01-09 LAB — BARTONELLA ANTIBODY PANEL
B Quintana IgM: NEGATIVE titer
B henselae IgM: NEGATIVE titer
B quintana IgG: NEGATIVE titer

## 2018-01-09 MED ORDER — WARFARIN SODIUM 5 MG PO TABS: 10.0000 mg | ORAL_TABLET | Freq: Once | ORAL | Status: DC

## 2018-01-09 MED ORDER — "INSULIN SYRINGE 31G X 5/16"" 0.3 ML MISC": 1.0000 | Freq: Three times a day (TID) | 0 refills | Status: AC

## 2018-01-09 MED ORDER — CEFTRIAXONE IV (FOR PTA / DISCHARGE USE ONLY): 2.0000 g | INTRAVENOUS | 0 refills | Status: AC

## 2018-01-09 MED ORDER — VANCOMYCIN IV (FOR PTA / DISCHARGE USE ONLY): 500.0000 mg | Freq: Three times a day (TID) | INTRAVENOUS | 0 refills | Status: DC

## 2018-01-09 MED ORDER — WARFARIN SODIUM 10 MG PO TABS: 10.0000 mg | ORAL_TABLET | Freq: Every day | ORAL | 0 refills | Status: DC

## 2018-01-09 MED ORDER — ENOXAPARIN SODIUM 80 MG/0.8ML ~~LOC~~ SOLN
80.0000 mg | SUBCUTANEOUS | Status: DC
  Administered 2018-01-09: 80 mg via SUBCUTANEOUS
  Filled 2018-01-09: qty 0.8

## 2018-01-09 MED ORDER — HEPARIN SOD (PORK) LOCK FLUSH 100 UNIT/ML IV SOLN
250.0000 [IU] | INTRAVENOUS | Status: AC | PRN
  Administered 2018-01-09: 250 [IU]

## 2018-01-09 NOTE — Care Management Note (Addendum)
Case Management Note  Patient Details  Name: Manuel Baker MRN: [id number] Date of Birth: 12/31/1995  Subjective/Objective:                    Action/Plan: Pt discharging home with [facility] services. Pt has PICC and has had education on Lovenox injections and IV antibiotics. Benefits check revealed the Lovenox would be $12. Pts Dad to stay until Sunday to assist as needed. Pt has f/u at [facility] and other appointments.  Family to provide transportation home.   Expected Discharge Date:  01/09/18               Expected Discharge Plan:  Home w Home Health Services  In-House Referral:     Discharge planning Services  CM Consult  Post Acute Care Choice:  Home Health Choice offered to:  Patient, Parent  DME Arranged:    DME Agency:     HH Arranged:  RN HH Agency:  [facility]  Status of Service:  Completed, signed off  If discussed at Long Length of Stay Meetings, dates discussed:    Additional Comments:  Kermit BaloKelli F Lai Hendriks, RN 01/09/2018, 4:33 PM

## 2018-01-09 NOTE — Progress Notes (Addendum)
Pt d/c home with single lumen PICC line  for home ABX  Infusion. D/C instructions done with teach back, pt verbalize understanding. Pt is stable, on room air with no new concerns. Pt will be transported out of the hospital by family

## 2018-01-09 NOTE — Discharge Summary (Signed)
[facility] Discharge Summary   Patient: Manuel Baker ENI:[id number]   PCP: No primary care provider on file. 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 Please follow-up with PCP as well as infectious disease. 2. Continue to follow-up with [facility].  Follow-up Information    [facility] Follow up on 01/13/2018.   Why:  Your appointment time is 2pm.  Contact information: Dr Wynetta Emery  [phone]       Rosalin Hawking, MD. Schedule an appointment as soon as possible for a visit in 6 week(s).   Specialty:  Neurology Contact information: [address] [place] [address] [phone]        Leanor Kail, [place] Follow up on 01/22/2018.   Specialty:  Cardiology Why:  8:30 AM for hospital follow up Contact information: [address] [place] [address] [phone]        Nahser, Wonda Cheng, MD Follow up on 04/07/2018.   Specialty:  Cardiology Why:  3:30 pm for surgical evaluation Contact information: [place] [address] [phone]        [place] Follow up on 01/12/2018.   Specialty:  Cardiology Why:  2:30 for coumadin clinic Contact information: [address] [place] [phone]         Diet recommendation: carb modified diet  Activity: The patient is advised to gradually  reintroduce usual activities.  Discharge Condition: good  Code Status: full code  History of present illness: As per the H and P dictated on admission, "Manuel Baker is a 22 y.o. 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  He had gone to his [facility] and was referred to the ER.  Patient otherwise denies any focal deficits visual symptoms difficulty speaking or swallowing.  Has been having generalized body aches and headache.  ED Course: In the ER patient appears to be nonfocal.  MRI of the brain shows features concerning for multifocal stroke and neurology was consulted.  Patient was loaded on Keppra for seizures.  Since patient past stroke swallow.  Patient admitted for further workup of stroke and seizures."  Hospital Course:  Summary of his active problems in the hospital is as following. Acute stroke Thought secondary to cardiac source in setting of Ebstein's anomaly. Transthoracic Echocardiogram pending. Likely with ASD. LE venous dopplers negative for DVT. 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 vegetation neurology recommends continuing with therapeutic anticoagulation. Switch to Lovenox daily with warfarin.  Seizure Patient with history. Not on antiepileptics as an outpatient -Neurology recommendations: Keppra -Seizure precautions -Outpatient follow-up with neurology  Diabetes mellitus, type 1, uncontrolled with  hyperglycemia Hyperglycemic overnight. Hemoglobin A1C of 12.2%. -Continue Lantus 30 units daily -Continue SSI  Ebstein's anomaly Patient receives care at [facility], in [place] per discussion with father. Partial repair 10 years ago. Per father, plan was to have second surgery at some point  soon. -Cardiology recommends follow-up at [facility] congenital heart clinic.  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 -Blood culture no growth -ID consulted, plan to vancomycin and ceftriaxone for 6 weeks.  PICC line ordered.  All other chronic medical condition were stable during the hospitalization.  Patient was ambulatory without any assistance. On the day of the discharge the patient's vitals were stable, and no other acute medical condition were reported by patient. the patient was felt safe to be discharge at home with home health.  Procedures and Results:  PICC line placement   Consultations:  Cardiology   Infectious disease  DISCHARGE MEDICATION: Allergies as of 01/09/2018   No Known Allergies     Medication List    TAKE these medications   atorvastatin 20 MG tablet Commonly known as:  LIPITOR Take 1 tablet (20 mg total) by mouth daily at 6 PM.   cefTRIAXone IVPB Commonly known as:  ROCEPHIN Inject 2 g into the vein daily. Indication:  endocarditis Last Day of Therapy:  02/17/18 Labs - Once weekly:  CBC/D and BMP, Labs - Every other week:  ESR and CRP   enoxaparin 80 MG/0.8ML injection Commonly known as:  LOVENOX Inject 0.8 mLs (80 mg total) into the skin daily for 5 days.   insulin glargine 100 UNIT/ML injection Commonly known as:  LANTUS Inject 30 Units into the skin at bedtime.   insulin lispro 100 UNIT/ML injection Commonly known as:  HUMALOG Inject 10-30 Units into the skin 3 (three) times daily before meals. Sliding scale   INSULIN SYRINGE .3CC/31GX5/16" 31G X 5/16" 0.3 ML Misc 1 Act by Does not apply route 4 (four) times daily -  before meals and at bedtime.   levETIRAcetam 500 MG tablet Commonly known as:  KEPPRA Take 1 tablet (500 mg total) by mouth 2 (two) times daily.   vancomycin IVPB Inject 500 mg into the vein every 8 (eight) hours. Indication:  endocarditis Last Day of  Therapy:  02/17/18 Labs - Sunday/Monday:  CBC/D, BMP, and vancomycin trough. Labs - Thursday:  BMP and vancomycin trough Labs - Every other week:  ESR and CRP   warfarin 10 MG tablet Commonly known as:  COUMADIN Take 1 tablet (10 mg total) by mouth daily at 6 PM.            Home Infusion Instuctions  (From admission, onward)        Start     Ordered   01/09/18 0000  Home infusion instructions [facility] May follow [place] Dosing Protocol; May administer Cathflo as needed to maintain patency of vascular access device.; Flushing of vascular access device: per [facility] Protocol: 0.9% NaCl pre/post medica...    Question Answer Comment  Instructions May follow [place] Dosing Protocol   Instructions May administer Cathflo as needed to maintain patency of vascular access device.   Instructions Flushing of vascular access device: per [facility] Protocol: 0.9% NaCl pre/post medication administration and prn patency; Heparin 100 u/ml, 27m for implanted ports and Heparin 10u/ml, 528mfor all other central venous catheters.   Instructions May follow [facility] Anaphylaxis Protocol for First Dose Administration in the home: 0.9% NaCl at 25-50 ml/hr to  maintain IV access for protocol meds. Epinephrine 0.3 ml IV/IM PRN and Benadryl 25-50 IV/IM PRN s/s of anaphylaxis.   Instructions [facility] Infusion Coordinator (RN) to assist per patient IV care needs in the home PRN.      01/09/18 1026     No Known Allergies Discharge Instructions    Ambulatory referral to Neurology   Complete by:  As directed    Pt will follow up with Dr. Erlinda Hong at [facility] in about 2 months. Thanks.   Diet - low sodium heart healthy   Complete by:  As directed    Discharge instructions   Complete by:  As directed    It is important that you read following instructions as well as go over your medication list with RN to help you understand your care after this hospitalization.  Discharge Instructions: Please follow-up with  PCP in one week  Please request your primary care physician to go over all Hospital Tests and Procedure/Radiological results at the follow up,  Please get all Hospital records sent to your PCP by signing hospital release before you go home.   Do not take more than prescribed Pain, Sleep and Anxiety Medications. You were cared for by a hospitalist during your hospital stay. If you have any questions about your discharge medications or the care you received while you were in the hospital after you are discharged, you can call the unit and ask to speak with the hospitalist on call if the hospitalist that took care of you is not available.  Once you are discharged, your primary care physician will handle any further medical issues. Please note that NO REFILLS for any discharge medications will be authorized once you are discharged, as it is imperative that you return to your primary care physician (or establish a relationship with a primary care physician if you do not have one) for your aftercare needs so that they can reassess your need for medications and monitor your lab values. You Must read complete instructions/literature along with all the possible adverse reactions/side effects for all the Medicines you take and that have been prescribed to you. Take any new Medicines after you have completely understood and accept all the possible adverse reactions/side effects. Wear Seat belts while driving. If you have smoked or chewed Tobacco in the last 2 yrs please stop smoking and/or stop any Recreational drug use.   Home infusion instructions [facility] May follow [place] Dosing Protocol; May administer Cathflo as needed to maintain patency of vascular access device.; Flushing of vascular access device: per [facility] Protocol: 0.9% NaCl pre/post medica...   Complete by:  As directed    Instructions:  May follow [place] Dosing Protocol   Instructions:  May administer Cathflo as needed to  maintain patency of vascular access device.   Instructions:  Flushing of vascular access device: per [facility] Protocol: 0.9% NaCl pre/post medication administration and prn patency; Heparin 100 u/ml, 69m for implanted ports and Heparin 10u/ml, 535mfor all other central venous catheters.   Instructions:  May follow [facility] Anaphylaxis Protocol for First Dose Administration in the home: 0.9% NaCl at 25-50 ml/hr to maintain IV access for protocol meds. Epinephrine 0.3 ml IV/IM PRN and Benadryl 25-50 IV/IM PRN s/s of anaphylaxis.   Instructions:  [facility] Coordinator (RN) to assist per patient IV care needs in the home PRN.   Increase activity slowly   Complete by:  As directed      Discharge  Exam: Filed Weights   01/03/18 0443 01/05/18 2200  Weight: 55.8 kg (123 lb) 55.8 kg (123 lb 0.3 oz)   Vitals:   01/09/18 0950 01/09/18 1539  BP: (!) 105/56 110/61  Pulse: 83 76  Resp: 20 20  Temp: 98.3 F (36.8 C) 98.8 F (37.1 C)  SpO2: 95% 97%   General: Appear in no distress, no Rash; Oral Mucosa moist. Cardiovascular: S1 and S2 Present, aortic systolic Murmur, no JVD Respiratory: Bilateral Air entry present and Clear to Auscultation, no Crackles, no wheezes Abdomen: Bowel Sound present, Soft and no tenderness Extremities: no Pedal edema, no calf tenderness Neurology: Grossly no focal neuro deficit.  The results of significant diagnostics from this hospitalization (including imaging, microbiology, ancillary and laboratory) are listed below for reference.    Significant Diagnostic Studies: Ct Angio Head W Or Wo Contrast  Result Date: 01/03/2018 CLINICAL DATA:  Stroke follow-up.  History of seizures and diabetes. EXAM: CT ANGIOGRAPHY HEAD AND NECK TECHNIQUE: Multidetector CT imaging of the head and neck was performed using the standard protocol during bolus administration of intravenous contrast. Multiplanar CT image reconstructions and MIPs were obtained to evaluate the vascular  anatomy. Carotid stenosis measurements (when applicable) are obtained utilizing NASCET criteria, using the distal internal carotid diameter as the denominator. CONTRAST:  81m ISOVUE-370 IOPAMIDOL (ISOVUE-370) INJECTION 76% COMPARISON:  Brain MRI from earlier today FINDINGS: CTA NECK FINDINGS Aortic arch: Changes of previous cardiopulmonary bypass. Right carotid system: Vessels are smooth and widely patent. Comparatively small to the left carotid system due to aplastic right A1 segment and left fetal type PCA. 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 Vessels are smooth and widely patent. Vertebral arteries: The left vertebral artery arises from the arch. No proximal subclavian stenosis. Codominant vertebral arteries that are both smooth and widely patent to the dura Skeleton: No acute or aggressive finding. Other neck: Negative soft tissues. Upper chest: Large appearance of the pulmonary artery outflow tract this patient history of congenital heart disease. Review of the MIP images confirms the above findings CTA HEAD FINDINGS Anterior circulation: Small right ICA in the setting of aplastic right A1 segment and fetal type left PCA. There is no branch occlusion or beading. No flow limiting stenosis. No evidence of aneurysm or vascular malformation. Posterior circulation: Small vertebrobasilar artery due to fetal type circulation on the left. Right pica and dominant left aica. The vertebral and basilar arteries are smooth and diffusely patent. Symmetric bilateral PCA flow. Venous sinuses: Patent Anatomic variants: As above Delayed phase: No abnormal intracranial enhancement. Small remote right cerebellar infarct. Review of the MIP images confirms the above findings IMPRESSION: 1. Negative CTA of the head and neck. No occlusion, stenosis, or embolic source identified. 2. Partially visualized postoperative heart. History of congenital heart disease. Electronically Signed   By: JMonte Fantasia M.D.   On: 01/03/2018 07:27   Dg Orthopantogram  Result Date: 01/06/2018 CLINICAL DATA:  22year old presenting with acute endocarditis. Evaluate dentition. EXAM: ORTHOPANTOGRAM/PANORAMIC COMPARISON:  None. FINDINGS: Incompletely erupted third molars on both sides of the mandible. No visible dental disease. No periapical lucencies involving any of the teeth. IMPRESSION: Incompletely erupted third molars on both sides of the mandible. Otherwise negative examination. Electronically Signed   By: TEvangeline DakinM.D.   On: 01/06/2018 16:53   Dg Chest 1 View  Result Date: 01/03/2018 CLINICAL DATA:  Leukocytosis.  History of Ebstein's anomaly. EXAM: CHEST 1 VIEW COMPARISON:  None. FINDINGS: The cardiac silhouette is mildly  enlarged. Normal pulmonary vascularity. No focal consolidation, pleural effusion, or pneumothorax. No acute osseous abnormality. Prior median sternotomy with fracture of the second most superior wire. IMPRESSION: Cardiomegaly.  No active disease. Electronically Signed   By: Titus Dubin M.D.   On: 01/03/2018 07:26   Ct Head Wo Contrast  Result Date: 01/02/2018 CLINICAL DATA:  Seizure, new, nontraumatic, 18-40 years. EXAM: CT HEAD WITHOUT CONTRAST TECHNIQUE: Contiguous axial images were obtained from the base of the skull through the vertex without intravenous contrast. COMPARISON:  None. FINDINGS: Brain: A benign-appearing cyst adjacent to the posterior horn of the left lateral ventricle measures 2.4 x 2.2 x 2.2 cm. There is focal dilation of the posterior horn of left lateral ventricle as well. No acute infarct, hemorrhage, or mass lesion is present. No migrational anomaly is evident. Temporal lobes are unremarkable. Remote lacunar infarcts are present posteriorly in the right cerebellum. Brainstem and cerebellum are otherwise normal. Vascular: No hyperdense vessel or unexpected calcification. Skull: Calvarium is intact. No focal lytic or blastic lesions are present. No significant  extracranial soft tissue injury is present. Sinuses/Orbits: The paranasal sinuses and mastoid air cells are clear. Globes and orbits are within normal limits. Other: None. IMPRESSION: 1. 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 There is no definite communication with the lateral ventricle. MRI of the head without and with contrast could be used for further evaluation. 2. No acute intracranial abnormality or focal abnormality to explain seizures otherwise. Electronically Signed   By: San Morelle M.D.   On: 01/02/2018 19:38   Ct Angio Neck W Or Wo Contrast  Result Date: 01/03/2018 CLINICAL DATA:  Stroke follow-up.  History of seizures and diabetes. EXAM: CT ANGIOGRAPHY HEAD AND NECK TECHNIQUE: Multidetector CT imaging of the head and neck was performed using the standard protocol during bolus administration of intravenous contrast. Multiplanar CT image reconstructions and MIPs were obtained to evaluate the vascular anatomy. Carotid stenosis measurements (when applicable) are obtained utilizing NASCET criteria, using the distal internal carotid diameter as the denominator. CONTRAST:  4m ISOVUE-370 IOPAMIDOL (ISOVUE-370) INJECTION 76% COMPARISON:  Brain MRI from earlier today FINDINGS: CTA NECK FINDINGS Aortic arch: Changes of previous cardiopulmonary bypass. Right carotid system: Vessels are smooth and widely patent. Comparatively small to the left carotid system due to aplastic right A1 segment and left fetal type PCA. 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 Vessels are smooth and widely patent. Vertebral arteries: The left vertebral artery arises from the arch. No proximal subclavian stenosis. Codominant vertebral arteries that are both smooth and widely patent to the dura Skeleton: No acute or aggressive finding. Other neck: Negative soft tissues. Upper chest: Large appearance of the pulmonary artery outflow tract this patient  history of congenital heart disease. Review of the MIP images confirms the above findings CTA HEAD FINDINGS Anterior circulation: Small right ICA in the setting of aplastic right A1 segment and fetal type left PCA. There is no branch occlusion or beading. No flow limiting stenosis. No evidence of aneurysm or vascular malformation. Posterior circulation: Small vertebrobasilar artery due to fetal type circulation on the left. Right pica and dominant left aica. The vertebral and basilar arteries are smooth and diffusely patent. Symmetric bilateral PCA flow. Venous sinuses: Patent Anatomic variants: As above Delayed phase: No abnormal intracranial enhancement. Small remote right cerebellar infarct. Review of the MIP images confirms the above findings IMPRESSION: 1. Negative CTA of the head and neck. No occlusion, stenosis, or  embolic source identified. 2. Partially visualized postoperative heart. History of congenital heart disease. Electronically Signed   By: Monte Fantasia M.D.   On: 01/03/2018 07:27   Mr Jeri Cos And Wo Contrast  Result Date: 01/03/2018 CLINICAL DATA:  22 y/o M; Seizure, new, nontraumatic, 18-40 yrs. History of seizures and prior seizure therapy. EXAM: MRI HEAD WITHOUT AND WITH CONTRAST TECHNIQUE: Multiplanar, multiecho pulse sequences of the brain and surrounding structures were obtained without and with intravenous contrast. CONTRAST:  65m MULTIHANCE GADOBENATE DIMEGLUMINE 529 MG/ML IV SOLN COMPARISON:  01/02/2018 CT head FINDINGS: Brain: Punctate foci of reduced diffusion are present within the bilateral occipital lobes, right cerebellar hemisphere, and there are subcentimeter foci of infarction within the left frontal centrum semiovale and superior left putamen. No associated hemorrhage or mass effect. Faint T2 FLAIR signal abnormality is present associated with the largest left frontal centrum semiovale lesion. Small chronic infarctions in the right cerebellar hemisphere. There several  punctate foci of susceptibility hypointensity scattered throughout the supratentorial brain and the left cerebellar hemisphere compatible with hemosiderin deposition of chronic microhemorrhage. 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 5 mm T1 hyperintense focus within the right paramedian sella poorly visualized on the gradient T1 sequence, possibly small lipoma or proteinaceous cyst. Normal size pituitary gland with midline infundibulum. Complete corpus callosum and vermis. Hippocampi are symmetric in size and signal. No disorder cortical formation, cortical dysplasia, or heterotopia identified. Vascular: Normal flow voids. Skull and upper cervical spine: Normal marrow signal. Sinuses/Orbits: Negative. Other: None. IMPRESSION: 1. Small foci of reduced diffusion are present within the left frontal white matter, left putamen, bilateral occipital lobes, and right cerebellum probably representing acute/early subacute infarctions. The pattern is atypical for seizure activity. Additionally, there are several punctate sequelae of chronic microhemorrhage throughout the brain and chronic infarcts in the right cerebellum. Given patient age, embolic phenomenon possibly with cardiac defect, sequelae of vasculitis, traumatic brain injury, or hematologic disorder should be considered. 2. 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 3. No additional structural cause of seizure identified. These results were called by telephone at the time of interpretation on 01/03/2018 at 4:34 am to Dr. KPryor Curia, who verbally acknowledged these results. Electronically Signed   By: LKristine GarbeM.D.   On: 01/03/2018 04:47   Vas [place]Lower Extremity Venous (dvt)  Result Date: 01/05/2018  Lower Venous  Study Indication: Stroke with ASD. Examination Guidelines: A complete evaluation includes B-mode imaging, spectral doppler, color doppler, and power doppler as needed of all accessible portions of each vessel. Bilateral testing is considered an integral part of a complete examination. Limited examinations for reoccurring indications may be performed as noted. The reflux portion of the exam is performed with the patient in reverse Trendelenburg.  Right Venous Findings: +---------+---------------+---------+-----------+----------+-------+          CompressibilityPhasicitySpontaneityPropertiesSummary +---------+---------------+---------+-----------+----------+-------+ CFV      Full           Yes      Yes                          +---------+---------------+---------+-----------+----------+-------+ FV Prox  Full                                                 +---------+---------------+---------+-----------+----------+-------+  FV Mid   Full                                                 +---------+---------------+---------+-----------+----------+-------+ FV DistalFull                                                 +---------+---------------+---------+-----------+----------+-------+ PFV      Full                                                 +---------+---------------+---------+-----------+----------+-------+ POP      Full           Yes      Yes                          +---------+---------------+---------+-----------+----------+-------+ PTV      Full                                                 +---------+---------------+---------+-----------+----------+-------+ PERO     Full                                                 +---------+---------------+---------+-----------+----------+-------+  Left Venous Findings: +---------+---------------+---------+-----------+----------+-------+          CompressibilityPhasicitySpontaneityPropertiesSummary  +---------+---------------+---------+-----------+----------+-------+ CFV      Full           Yes      Yes                          +---------+---------------+---------+-----------+----------+-------+ FV Prox  Full                                                 +---------+---------------+---------+-----------+----------+-------+ FV Mid   Full                                                 +---------+---------------+---------+-----------+----------+-------+ FV DistalFull                                                 +---------+---------------+---------+-----------+----------+-------+ PFV      Full                                                 +---------+---------------+---------+-----------+----------+-------+  POP      Full           Yes      Yes                          +---------+---------------+---------+-----------+----------+-------+ PTV      Full                                                 +---------+---------------+---------+-----------+----------+-------+ PERO     Full                                                 +---------+---------------+---------+-----------+----------+-------+    Final Interpretation: Right: There is no evidence of deep vein thrombosis in the lower extremity. No cystic structure found in the popliteal fossa. Left: There is no evidence of deep vein thrombosis in the lower extremity. No cystic structure found in the popliteal fossa.  *See table(s) above for measurements and observations. Electronically signed by Curt Jews on 01/05/2018 at 2:10:59 PM.   Final    Microbiology: Recent Results (from the past 240 hour(s))  Culture, blood (routine x 2)     Status: None (Preliminary result)   Collection Time: 01/06/18  2:32 PM  Result Value Ref Range Status   Specimen Description BLOOD LEFT ANTECUBITAL  Final   Special Requests   Final    BLOOD AEROBIC BOTTLE Blood Culture results may not be optimal due to an inadequate  volume of blood received in culture bottles   Culture   Final    NO GROWTH 3 DAYS Performed at [facility], [place] [address], [place], [place] [address]    Report Status PENDING  Incomplete  Culture, blood (routine x 2)     Status: None (Preliminary result)   Collection Time: 01/06/18  2:38 PM  Result Value Ref Range Status   Specimen Description BLOOD LEFT HAND  Final   Special Requests   Final    BLOOD AEROBIC BOTTLE Blood Culture results may not be optimal due to an inadequate volume of blood received in culture bottles   Culture   Final    NO GROWTH 3 DAYS Performed at [facility], [place] [address], [place], [place] [address]    Report Status PENDING  Incomplete  Culture, blood (routine x 2)     Status: None (Preliminary result)   Collection Time: 01/07/18  5:55 AM  Result Value Ref Range Status   Specimen Description BLOOD LEFT ANTECUBITAL  Final   Special Requests IN PEDIATRIC BOTTLE Blood Culture adequate volume  Final   Culture   Final    NO GROWTH 2 DAYS Performed at [facility], [place] [address], [place], [place] [address]    Report Status PENDING  Incomplete  Culture, blood (routine x 2)     Status: None (Preliminary result)   Collection Time: 01/07/18  6:00 AM  Result Value Ref Range Status   Specimen Description BLOOD LEFT HAND  Final   Special Requests IN PEDIATRIC BOTTLE Blood Culture adequate volume  Final   Culture   Final    NO GROWTH 2 DAYS Performed at [facility], [place]  [address], [place], [place] [address]    Report Status PENDING  Incomplete  Culture, blood (routine x 2)     Status: None (Preliminary result)   Collection Time: 01/07/18  1:30 PM  Result Value Ref Range Status   Specimen Description BLOOD RIGHT ANTECUBITAL  Final   Special Requests   Final    BOTTLES DRAWN AEROBIC AND ANAEROBIC Blood Culture adequate volume   Culture   Final    NO GROWTH 2 DAYS Performed at [facility], [place] [address], [place], [place]  [address]    Report Status PENDING  Incomplete     Labs: CBC: Recent Labs  Lab 01/02/18 1828 01/03/18 0621 01/07/18 0554 01/08/18 0410 01/09/18 0520  WBC 13.1* 8.0 6.8 6.6 5.6  HGB 17.3* 14.4 16.1 15.6 15.3  HCT 48.4 41.9 45.8 44.0 44.8  MCV 81.2 80.4 80.9 80.4 81.9  PLT 265 269 247 239 665   Basic Metabolic Panel: Recent Labs  Lab 01/02/18 1828 01/03/18 0621 01/04/18 0446 01/06/18 0954  NA 139 130* 137 141  K 3.9 4.3 3.9 3.7  CL 102 101 109 111  CO2 24 17* 19* 21*  GLUCOSE 255* 522* 204* 77  BUN '10 11 13 11  ' CREATININE 1.16 1.30* 1.03 0.90  CALCIUM 9.4 8.4* 8.5* 9.2   Liver Function Tests: Recent Labs  Lab 01/03/18 0621  AST 72*  ALT 39  ALKPHOS 88  BILITOT 1.2  PROT 5.5*  ALBUMIN 3.1*   No results for input(s): LIPASE, AMYLASE in the last 168 hours. No results for input(s): AMMONIA in the last 168 hours. Cardiac Enzymes: No results for input(s): CKTOTAL, CKMB, CKMBINDEX, TROPONINI in the last 168 hours. BNP (last 3 results) No results for input(s): BNP in the last 8760 hours. CBG: Recent Labs  Lab 01/08/18 1625 01/08/18 2145 01/09/18 0627 01/09/18 1126 01/09/18 1320  GLUCAP 68 221* 242* 63* 92   Time spent: 35 minutes  Signed:  Berle Mull  [facility]  01/09/2018  , 3:47 PM

## 2018-01-09 NOTE — Progress Notes (Signed)
Progress Note  Patient Name: Manuel Baker Date of Encounter: 01/09/2018  Primary Cardiologist: Mertie Moores, MD   Subjective   Feeling well.  Denies chest pain or shortness of breath.  No exertional symptoms.  Wants to go back to school.   Inpatient Medications    Scheduled Meds: . atorvastatin  20 mg Oral q1800  . insulin aspart  0-9 Units Subcutaneous TID WC  . insulin glargine  30 Units Subcutaneous QHS  . levETIRAcetam  500 mg Oral BID  . ondansetron (ZOFRAN) IV  4 mg Intravenous Once  . sodium chloride flush  10-40 mL Intracatheter Q12H  . sodium chloride flush  10-40 mL Intracatheter Q12H  . Warfarin - Pharmacist Dosing Inpatient   Does not apply q1800   Continuous Infusions: . cefTRIAXone (ROCEPHIN)  IV Stopped (01/08/18 2319)  . vancomycin Stopped (01/09/18 0807)  . vancomycin     PRN Meds: acetaminophen **OR** acetaminophen (TYLENOL) oral liquid 160 mg/5 mL **OR** acetaminophen, sodium chloride flush, sodium chloride flush   Vital Signs    Vitals:   01/08/18 1730 01/08/18 2148 01/09/18 0140 01/09/18 0521  BP: (!) 106/54 108/71 119/66 129/80  Pulse: 80 82 90 72  Resp: '18 18 18 18  ' Temp: 98.4 F (36.9 C) 98.6 F (37 C) 98 F (36.7 C) 97.9 F (36.6 C)  TempSrc: Oral Oral Oral Oral  SpO2: 99% 93% 93% 95%  Weight:      Height:        Intake/Output Summary (Last 24 hours) at 01/09/2018 0912 Last data filed at 01/08/2018 2232 Gross per 24 hour  Intake 480 ml  Output -  Net 480 ml   Filed Weights   01/03/18 0443 01/05/18 2200  Weight: 123 lb (55.8 kg) 123 lb 0.3 oz (55.8 kg)    Telemetry    Sinus rhythm.  No events - Personally Reviewed  ECG   01/03/18 Sinus rhythm. Rate 72 bpm.  First degree AV block.  RBBB.  Prior septal infarct.     - Personally Reviewed  Physical Exam   VS:  BP 129/80 (BP Location: Left Arm)   Pulse 72   Temp 97.9 F (36.6 C) (Oral)   Resp 18   Ht '5\' 6"'  (1.676 m)   Wt 123 lb 0.3 oz (55.8 kg)   SpO2 95%   BMI 19.86  kg/m  , BMI Body mass index is 19.86 kg/m. GENERAL:  Well appearing.  Thin.  No acute distress HEENT: Pupils equal round and reactive, fundi not visualized, oral mucosa unremarkable NECK:  no jugular venous distention, waveform within normal limits, carotid upstroke brisk and symmetric, no bruits, no thyromegaly LUNGS:  Clear to auscultation bilaterally.  No crackles, wheezes or rhonchi HEART:  RRR.  PMI not displaced or sustained,S1 and S2 within normal limits, no S3, no S4, no clicks, no rubs, no murmurs ABD:  Flat, positive bowel sounds normal in frequency in pitch, no bruits, no rebound, no guarding, no midline pulsatile mass, no hepatomegaly, no splenomegaly EXT:  2 plus pulses throughout, no edema, no cyanosis no clubbing SKIN:  No rashes no nodules NEURO:  Cranial nerves II through XII grossly intact, motor grossly intact throughout [facility]:  Cognitively intact, oriented to person place and time   Labs    Chemistry Recent Labs  Lab 01/03/18 0621 01/04/18 0446 01/06/18 0954  NA 130* 137 141  K 4.3 3.9 3.7  CL 101 109 111  CO2 17* 19* 21*  GLUCOSE 522* 204* 77  BUN '11 13 11  ' CREATININE 1.30* 1.03 0.90  CALCIUM 8.4* 8.5* 9.2  PROT 5.5*  --   --   ALBUMIN 3.1*  --   --   AST 72*  --   --   ALT 39  --   --   ALKPHOS 88  --   --   BILITOT 1.2  --   --   GFRNONAA >60 >60 >60  GFRAA >60 >60 >60  ANIONGAP '12 9 9     ' Hematology Recent Labs  Lab 01/07/18 0554 01/08/18 0410 01/09/18 0520  WBC 6.8 6.6 5.6  RBC 5.66 5.47 5.47  HGB 16.1 15.6 15.3  HCT 45.8 44.0 44.8  MCV 80.9 80.4 81.9  MCH 28.4 28.5 28.0  MCHC 35.2 35.5 34.2  RDW 13.5 13.4 13.8  PLT 247 239 258    Cardiac EnzymesNo results for input(s): TROPONINI in the last 168 hours. No results for input(s): TROPIPOC in the last 168 hours.   BNPNo results for input(s): BNP, PROBNP in the last 168 hours.   DDimer No results for input(s): DDIMER in the last 168 hours.   Radiology    No results  found.  Cardiac Studies   Echo 01/04/18: Study Conclusions  - Left ventricle: The cavity size was normal. Wall thickness was   normal. Systolic function was normal. The estimated ejection   fraction was in the range of 50% to 55%. 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 Interestingly the septal insertion   is currently not apically displaced Cannot r/o small residual   ASD. - Right atrium: The atrium was severely dilated. - Atrial septum: A septal defect cannot be excluded. - Tricuspid valve: There was severe regurgitation.  TEE 01/06/18: Study Conclusions  - Left ventricle: Systolic function was normal. The estimated   ejection fraction was in the range of 50% to 55%. - Left atrium: No evidence of thrombus in the atrial cavity or   appendage. - Right ventricle: The RV is severely dilated and hypokinetic - Right atrium: Large thrombus burden in dome of RA. The atrium was   massively dilated. - Atrial septum: There appears to be a large PFO or ASD. There is   markedly positive right to left shunting by bubble study   High likelyhood of paradoxic emboli caused TIA - Tricuspid valve: The patient is apparently post Ebsteins Anomaly   repair at a young age. However the septal leaflet is not apically   displaced. The TV is morphologically markedly abnormal. There   appears to be an annuloplasty ring. There is failure of leaflet   coaptation with wide open TR. The lateral leaflet is thickened   with likely 1 cm vegetation. There appears to be a large thrombus   burden in the dome of the RA.  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  Assessment & Plan   # Ebstein's anomaly:  # RV failure: # ASD:  Operative report reviewed from 06/11/2008.  He  had repair of Ebstein's anomaly with a [facility] procedure and partial closure of a secundum ASD.   Both a PFO and secundum ASD were closed with sutures.  His RV function was poor at that time and there was mild-moderate TR even after the valve was  repaired.  His CVP was still elevated so the suture closing the PFO was partially reopened to allow some R-->L flow.  Mr. Wion is euvolemic and asymptomatic.  He denies any exertional symptoms.  I personally reviewed his TTE and TEE.  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  There is also significant shunting from right to left across an ASD, indicating elevated pulmonary pressure.  He will need RHC to assess pulmonary pressure and Qp:Qs prior to surgical closure.   There is not enough of a rim for a surgical device and he requires tricuspid valve surgery anyway, so this much be repaired surgically.   I have contacted Dr. Jeani Hawking Ward (aldult congenital cardiology) at [facility] and she will arrange for him to be seen in clinic next week.  He would like to wait until this summer for surgery if possible.  He also has severe right ventricular systolic dysfunction.  His right ventricle was severely dilated and hypokinetic.  There is no evidence of pre-exitation on his EKG.   # Embolic strokes: TEE revealed a large clot burden in the RA.  The posterior leaflet is also thickened and possibly has thrombus.  He has R-->L shunting across an ASD, which is the source of his stroke.  He has been started on heparin and warfarin.  OK to switch to weight based lovenox.  We can see him in coumadin clinic on Monday.  Goal INR 2-3.  ESR is 1, he is not anemic, and WBC is normal so endocarditis is unlikely.  Blood cultures are pending.   Adult congential clinic and subsequent surgical repair as above.   # ?Endocarditis: TEE was concerning for vegetation on the tricuspid valve.  I suspect that this is more dysplastic than vegetation.  WBC was 13.1 on admission but normalized to 8 the following day before he  even received any antibiotics.  He is not anemic and ESR is 1.  A PICC line has been placed per ID with plans for 6 weeks of antibiotics.  Blood cultures remain negative to date.  Legionella, Bartonella, and Q fever antibodies pending.   Dispo: OK for discharge today (on lovenox and warfarin) from a cardiac standpoint.  Follow up has been arranged.  For questions or updates, please contact [place] Please consult [email or web address] for contact info under Cardiology/STEMI.      Signed, Skeet Latch, MD  01/09/2018, 9:12 AM

## 2018-01-09 NOTE — Progress Notes (Signed)
ANTICOAGULATION CONSULT NOTE  Pharmacy Consult for IV Heparin Indication:  Stroke, RA with large thrombus burden  No Known Allergies  Patient Measurements: Height: 5\' 6"  (167.6 cm) Weight: 123 lb 0.3 oz (55.8 kg) IBW/kg (Calculated) : 63.8 Heparin Dosing Weight: 55 kg  Vital Signs: Temp: 98.3 F (36.8 C) (02/15 0950) Temp Source: Oral (02/15 0950) BP: 105/56 (02/15 0950) Pulse Rate: 83 (02/15 0950)  Labs: Recent Labs    01/07/18 0554 01/07/18 1852 01/08/18 0410 01/09/18 0520  HGB 16.1  --  15.6 15.3  HCT 45.8  --  44.0 44.8  PLT 247  --  239 258  LABPROT 14.2  --  14.6 14.7  INR 1.10  --  1.15 1.16  HEPARINUNFRC 0.30 0.19* 0.50  --    Estimated Creatinine Clearance: 103.3 mL/min (by C-G formula based on SCr of 0.9 mg/dL).  Assessment: 22 yo male admitted with new stroke, found with severe RV failure, RA enlargement with large thrombus burden.  Pharmacy asked to manage IV heparin bridge to Coumadin.   Heparin discontinued 2/14 and Lovenox initiated; Provider would like patient to receive once daily Lovenox to reduce injection burden and the ease of dosing with commercially available Lovenox 80mg  dose. Patient has received 3 doses of warfarin and INR remains 1.16 this morning; CBC stable and no overt bleeding  Goal of Therapy:  INR: 2-3 Monitor platelets by anticoagulation protocol: Yes    Plan:  Warfarin 10mg  x1 tonight Lovenox 80mg  subcutaneous QD Monitor for CBC, renal function, and for s/sx of bleeding  Ruben Imony Ikeem Cleckler, PharmD Clinical Pharmacist 01/09/2018 10:14 AM

## 2018-01-09 NOTE — Progress Notes (Signed)
PHARMACY CONSULT NOTE FOR:  OUTPATIENT  PARENTERAL ANTIBIOTIC THERAPY (OPAT)  Indication: endocarditis Regimen: vancomcyin 500mg  IV q8 hours and ceftriaxone 2G IV QD End date: 02/17/18  IV antibiotic discharge orders are pended. To discharging provider:  please sign these orders via discharge navigator,  Select New Orders & click on the button choice - Manage This Unsigned Work.     Thank you for allowing pharmacy to be a part of this patient's care.  Toniann Failony L Garrit Marrow 01/09/2018, 10:13 AM

## 2018-01-11 LAB — CULTURE, BLOOD (ROUTINE X 2)
Culture: NO GROWTH
Culture: NO GROWTH

## 2018-01-12 ENCOUNTER — Ambulatory Visit (INDEPENDENT_AMBULATORY_CARE_PROVIDER_SITE_OTHER): Payer: BLUE CROSS/BLUE SHIELD

## 2018-01-12 DIAGNOSIS — I240 Acute coronary thrombosis not resulting in myocardial infarction: Secondary | ICD-10-CM

## 2018-01-12 DIAGNOSIS — I2690 Septic pulmonary embolism without acute cor pulmonale: Secondary | ICD-10-CM

## 2018-01-12 DIAGNOSIS — Z7901 Long term (current) use of anticoagulants: Secondary | ICD-10-CM | POA: Diagnosis not present

## 2018-01-12 DIAGNOSIS — I639 Cerebral infarction, unspecified: Secondary | ICD-10-CM | POA: Diagnosis not present

## 2018-01-12 DIAGNOSIS — I513 Intracardiac thrombosis, not elsewhere classified: Secondary | ICD-10-CM

## 2018-01-12 DIAGNOSIS — I76 Septic arterial embolism: Secondary | ICD-10-CM

## 2018-01-12 LAB — POCT INR: INR: 2

## 2018-01-12 LAB — CULTURE, BLOOD (ROUTINE X 2)
CULTURE: NO GROWTH
Culture: NO GROWTH
Culture: NO GROWTH
Special Requests: ADEQUATE
Special Requests: ADEQUATE
Special Requests: ADEQUATE

## 2018-01-12 NOTE — Patient Instructions (Signed)
Description   Start taking 10mg  (1 tablet) daily except 5mg  (1/2 tablet) on Mondays, Wednesdays, and Fridays.  Recheck in 1 week.    A full discussion of the nature of anticoagulants has been carried out.  A benefit risk analysis has been presented to the patient, so that they understand the justification for choosing anticoagulation at this time. The need for frequent and regular monitoring, precise dosage adjustment and compliance is stressed.  Side effects of potential bleeding are discussed.  The patient should avoid any OTC items containing aspirin or ibuprofen, and should avoid great swings in general diet.  Avoid alcohol consumption.  Call if any signs of abnormal bleeding.

## 2018-01-13 ENCOUNTER — Encounter (HOSPITAL_COMMUNITY): Payer: Self-pay | Admitting: Emergency Medicine

## 2018-01-13 ENCOUNTER — Emergency Department (HOSPITAL_COMMUNITY)
Admission: EM | Admit: 2018-01-13 | Discharge: 2018-01-13 | Disposition: A | Discharge status: Home / Self Care | Payer: BLUE CROSS/BLUE SHIELD | Attending: Physician Assistant | Admitting: Physician Assistant

## 2018-01-13 ENCOUNTER — Other Ambulatory Visit: Payer: Self-pay

## 2018-01-13 ENCOUNTER — Telehealth: Payer: Self-pay | Admitting: Cardiology

## 2018-01-13 DIAGNOSIS — E109 Type 1 diabetes mellitus without complications: Secondary | ICD-10-CM | POA: Diagnosis not present

## 2018-01-13 DIAGNOSIS — Z0001 Encounter for general adult medical examination with abnormal findings: Secondary | ICD-10-CM | POA: Diagnosis not present

## 2018-01-13 DIAGNOSIS — E875 Hyperkalemia: Secondary | ICD-10-CM | POA: Insufficient documentation

## 2018-01-13 DIAGNOSIS — Z794 Long term (current) use of insulin: Secondary | ICD-10-CM | POA: Insufficient documentation

## 2018-01-13 DIAGNOSIS — Z79899 Other long term (current) drug therapy: Secondary | ICD-10-CM | POA: Insufficient documentation

## 2018-01-13 DIAGNOSIS — R899 Unspecified abnormal finding in specimens from other organs, systems and tissues: Secondary | ICD-10-CM

## 2018-01-13 DIAGNOSIS — Z7901 Long term (current) use of anticoagulants: Secondary | ICD-10-CM | POA: Diagnosis not present

## 2018-01-13 LAB — BASIC METABOLIC PANEL
ANION GAP: 11 (ref 5–15)
BUN: 14 mg/dL (ref 6–20)
CHLORIDE: 106 mmol/L (ref 101–111)
CO2: 20 mmol/L — ABNORMAL LOW (ref 22–32)
Calcium: 8.7 mg/dL — ABNORMAL LOW (ref 8.9–10.3)
Creatinine, Ser: 0.96 mg/dL (ref 0.61–1.24)
GFR calc Af Amer: >60 mL/min (ref >60)
GLUCOSE: 119 mg/dL — AB (ref 65–99)
POTASSIUM: 3.6 mmol/L (ref 3.5–5.1)
SODIUM: 137 mmol/L (ref 135–145)

## 2018-01-13 LAB — MISC LABCORP TEST (SEND OUT): Labcorp test code: 164608

## 2018-01-13 NOTE — ED Provider Notes (Signed)
[facility] EMERGENCY DEPARTMENT Provider Note   CSN: [id number] Arrival date & time: 01/13/18  0250     History   Chief Complaint Chief Complaint  Patient presents with  . Abnormal Lab    HPI Manuel Baker is a 22 y.o. male.  HPI 22 year old African-American male past medical history significant for congenital heart disease, seizure disorder, diabetes, stroke that presents to the emergency department today after being notified that his potassium was 7.1.  Patient states that his home health nurse drew his blood yesterday and tested at home and was notified by his cardiologist today that his potassium was 7.1 and to come to the ED for emergent evaluation.  Patient denies any symptoms.  Specifically patient denies any chest pain, shortness of breath, fevers or chills.  States he feels absolutely normal and is missing a class at school  right now.  Patient is receiving a for possible vegetation on his tricuspid valve after discharge from hospital stay 2 days ago.  Patient is followed by cardiology, infectious disease and primary care.  No complaints at this time.  Pt denies any fever, chill, ha, vision changes, lightheadedness, dizziness, congestion, neck pain, cp, sob, cough, abd pain, n/v/d, urinary symptoms, change in bowel habits, melena, hematochezia, lower extremity paresthesias.  Past Medical History:  Diagnosis Date  . Congenital heart disease   . Diabetes mellitus without complication ([place])   . Seizures [facility])     Patient Active Problem List   Diagnosis Date Noted  . Long term (current) use of anticoagulants [Z79.01] 01/12/2018  . 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 Endocarditis   . Leucocytosis   . [place] endocarditis   . 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 Acute cardioembolic stroke ([place]) 64/40/3474  . Uncontrolled type 1 diabetes mellitus with hyperglycemia ([place]) 01/03/2018  . Seizure ([place]) 01/03/2018  . Stroke (cerebrum) ([place]) 01/03/2018    . Stroke ([place]) 01/03/2018  . Ebstein's anomaly     Past Surgical History:  Procedure Laterality Date  . open heart surgery    . TEE WITHOUT CARDIOVERSION N/A 01/06/2018   Procedure: TRANSESOPHAGEAL ECHOCARDIOGRAM (TEE);  Surgeon: Josue Hector, MD;  [facility];  Service: Cardiovascular;  Laterality: N/A;       Home Medications    Prior to Admission medications   Medication Sig Start Date End Date Taking? Authorizing Provider  atorvastatin (LIPITOR) 20 MG tablet Take 1 tablet (20 mg total) by mouth daily at 6 PM. 01/08/18   Lavina Hamman, MD  cefTRIAXone (ROCEPHIN) IVPB Inject 2 g into the vein daily. Indication:  endocarditis Last Day of Therapy:  02/17/18 Labs - Once weekly:  CBC/D and BMP, Labs - Every other week:  ESR and CRP 01/09/18 02/17/18  Lavina Hamman, MD  enoxaparin (LOVENOX) 80 MG/0.8ML injection Inject 0.8 mLs (80 mg total) into the skin daily for 5 days. 01/08/18 01/13/18  Lavina Hamman, MD  insulin glargine (LANTUS) 100 UNIT/ML injection Inject 30 Units into the skin at bedtime.    [provider]  insulin lispro (HUMALOG) 100 UNIT/ML injection Inject 10-30 Units into the skin 3 (three) times daily before meals. Sliding scale    [provider]  Insulin Syringe-Needle U-100 (INSULIN SYRINGE .3CC/31GX5/16") 31G X 5/16" 0.3 ML MISC 1 Act by Does not apply route 4 (four) times daily -  before meals and at bedtime. 01/09/18   Lavina Hamman, MD  levETIRAcetam (KEPPRA) 500 MG tablet Take 1 tablet (  500 mg total) by mouth 2 (two) times daily. 01/08/18   Lavina Hamman, MD  vancomycin IVPB Inject 500 mg into the vein every 8 (eight) hours. Indication:  endocarditis Last Day of Therapy:  02/17/18 Labs - Sunday/Monday:  CBC/D, BMP, and vancomycin trough. Labs - Thursday:  BMP and vancomycin trough Labs - Every other week:  ESR and CRP 01/09/18   Lavina Hamman, MD  warfarin (COUMADIN) 10 MG tablet Take 1 tablet (10 mg total) by mouth daily at 6  PM. 01/09/18   Lavina Hamman, MD    Family History Family History  Problem Relation Age of Onset  . Diabetes Mellitus I Sister     Social History Social History   Tobacco Use  . Smoking status: Never Smoker  . Smokeless tobacco: Never Used  Substance Use Topics  . Alcohol use: Yes    Frequency: Never    Comment: social  . Drug use: Yes    Types: Marijuana     Allergies   Patient has no known allergies.   Review of Systems Review of Systems  Constitutional: Negative for chills, diaphoresis and fever.  HENT: Negative for congestion.   Eyes: Negative for visual disturbance.  Respiratory: Negative for cough and shortness of breath.   Cardiovascular: Negative for chest pain, palpitations and leg swelling.  Gastrointestinal: Negative for abdominal pain, diarrhea, nausea and vomiting.  Genitourinary: Negative for dysuria, flank pain, frequency, hematuria and urgency.  Musculoskeletal: Negative for arthralgias and myalgias.  Skin: Negative for rash.  Neurological: Negative for dizziness, syncope, weakness, light-headedness, numbness and headaches.  Psychiatric/Behavioral: Negative for sleep disturbance. The patient is not nervous/anxious.      Physical Exam Updated Vital Signs BP 121/75   Pulse 69   Temp 98.3 F (36.8 C) (Oral)   Resp 16   Ht '5\' 6"'  (1.676 m)   Wt 55.8 kg (123 lb)   SpO2 98%   BMI 19.85 kg/m   Physical Exam  Constitutional: He appears well-developed and well-nourished. No distress.  HENT:  Head: Normocephalic and atraumatic.  Eyes: Right eye exhibits no discharge. Left eye exhibits no discharge. No scleral icterus.  Neck: Normal range of motion.  Cardiovascular: Normal rate, regular rhythm, normal heart sounds and intact distal pulses. Exam reveals no gallop and no friction rub.  No murmur heard. Pulmonary/Chest: Effort normal and breath sounds normal. No stridor. No respiratory distress. He has no wheezes. He has no rales. He exhibits no  tenderness.  Abdominal: Soft. Bowel sounds are normal.  Musculoskeletal: Normal range of motion.  Neurological: He is alert.  Skin: Skin is warm and dry. Capillary refill takes less than 2 seconds. No pallor.  Psychiatric: His behavior is normal. Judgment and thought content normal.  Nursing note and vitals reviewed.    ED Treatments / Results  Labs (all labs ordered are listed, but only abnormal results are displayed) Labs Reviewed  BASIC METABOLIC PANEL - Abnormal; Notable for the following components:      Result Value   CO2 20 (*)    Glucose, Bld 119 (*)    Calcium 8.7 (*)    All other components within normal limits    EKG  EKG Interpretation None       Radiology No results found.  Procedures Procedures (including critical care time)  Medications Ordered in ED Medications - No data to display   Initial Impression / Assessment and Plan / ED Course  I have reviewed the triage vital signs and  the nursing notes.  Pertinent labs & imaging results that were available during my care of the patient were reviewed by me and considered in my medical decision making (see chart for details).     Patient resents the ED after he was notified that his potassium was 7.1 after being drawn by his home health nurse yesterday.  Patient has no history of hyperkalemia.  He denies any medical complaints at this time.  Vital signs are reassuring.  Patient had lab work drawn in the ED today with a potassium of 3.6.  Patient's blood was likely hemolyzed at home.  Given normal potassium at this time without any complaints patient can be discharged home with outpatient follow-up.  He is requesting to be discharged so he can go take his home IV antibiotics and return to class.  Pt is hemodynamically stable, in NAD, & able to ambulate in the ED. Evaluation does not show pathology that would require ongoing emergent intervention or inpatient treatment. I explained the diagnosis to the patient.  Pain has been managed & has no complaints prior to dc. Pt is comfortable with above plan and is stable for discharge at this time. All questions were answered prior to disposition. Strict return precautions for f/u to the ED were discussed. Encouraged follow up with PCP.  Pt is cussed my attending who is agreeable the above plan.  Final Clinical Impressions(s) / ED Diagnoses   Final diagnoses:  Abnormal laboratory test result    ED Discharge Orders    None       Aaron Edelman 01/13/18 1024    [place], Fredia Sorrow, MD 01/17/18 1515

## 2018-01-13 NOTE — Telephone Encounter (Signed)
[facility] called with a stat potassium level of 7.1.  Instructed nurse to call patient and instruct [facility] for to repeat lab and followup with treatment. Nurse agreed.

## 2018-01-13 NOTE — Discharge Instructions (Signed)
Follow-up with your doctors as indicated.  Your potassium level was 3.6 today in the ED.

## 2018-01-13 NOTE — ED Triage Notes (Signed)
Pt sent from doctor because they said his potassium was 7.1. Pt reports no other complaints at this time.

## 2018-01-14 ENCOUNTER — Encounter: Payer: Self-pay | Admitting: Physician Assistant

## 2018-01-15 ENCOUNTER — Encounter: Payer: Self-pay | Admitting: Infectious Disease

## 2018-01-16 ENCOUNTER — Other Ambulatory Visit: Payer: Self-pay | Admitting: Pharmacist

## 2018-01-19 ENCOUNTER — Ambulatory Visit (INDEPENDENT_AMBULATORY_CARE_PROVIDER_SITE_OTHER): Payer: BLUE CROSS/BLUE SHIELD | Admitting: *Deleted

## 2018-01-19 DIAGNOSIS — Z7901 Long term (current) use of anticoagulants: Secondary | ICD-10-CM

## 2018-01-19 DIAGNOSIS — I639 Cerebral infarction, unspecified: Secondary | ICD-10-CM

## 2018-01-19 DIAGNOSIS — I2690 Septic pulmonary embolism without acute cor pulmonale: Secondary | ICD-10-CM | POA: Diagnosis not present

## 2018-01-19 DIAGNOSIS — I513 Intracardiac thrombosis, not elsewhere classified: Secondary | ICD-10-CM | POA: Diagnosis not present

## 2018-01-19 DIAGNOSIS — I240 Acute coronary thrombosis not resulting in myocardial infarction: Secondary | ICD-10-CM

## 2018-01-19 DIAGNOSIS — I76 Septic arterial embolism: Secondary | ICD-10-CM

## 2018-01-19 LAB — POCT INR: INR: 2.9

## 2018-01-19 MED ORDER — WARFARIN SODIUM 10 MG PO TABS: 10.0000 mg | ORAL_TABLET | Freq: Every day | ORAL | 0 refills | Status: DC

## 2018-01-19 NOTE — Patient Instructions (Addendum)
Description   Continue taking 10mg  (1 tablet) daily except 5mg  (1/2 tablet) on Mondays, Wednesdays, and Fridays.  Resume dark green intake.  Recheck in 1 week.

## 2018-01-21 NOTE — Progress Notes (Signed)
Cardiology Office Note    Date:  01/22/2018   ID:  Manuel Baker, DOB [birth year], MRN [id number]  PCP:  Patient, No Pcp Per  Cardiologist: Dr. Acie Fredrickson Chief Complaint: ER follow up   History of Present Illness:   Manuel Baker is a 22 y.o. male with hx of Ebsteins anomaly s/p Cone procedure and partial closure of a secundum ASD (both close with suture) at age 41 @ [place], Type I  DM, seizure and recent stroke presents for follow up.   He is a Ship broker at [employer]. Patient used to have significant shortness of breath with exertion, cyanosis of his lips and fingertips and clubbing.  Since his surgery at age 89 he is not had any of these symptoms.  He is able to exercise on a regular basis.  He walks on the treadmill or participate in sports without severe   shortness of breath or cyanosis.  Admitted 2/8-2/15 with acute cardioembolic stroke. 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 Vegetation.  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  There is also significant shunting from right to left across an ASD, indicating elevated pulmonary pressure.  He will need RHC to assess pulmonary pressure and Qp:Qs prior to surgical closure.   There is not enough of a rim for a surgical device and he requires tricuspid valve surgery anyway, so this much be repaired surgically.  Dr. Oval Linsey have contacted Dr. Joyice Faster (aldult congenital cardiology) at [facility] and she will arrange for him to be seen in clinic next week.  He would like to wait until this summer for surgery if possible.  He also has severe right ventricular systolic dysfunction.  His right ventricle was severely dilated and hypokinetic.  There is no evidence of pre-exitation on his EKG.   Noted potassium of 7.1 by home health nurse and advised to follow up in ER 01/13/18. However in ER K was 3.6. Patient's blood was likely hemolyzed at home.  Given normal potassium at this time without any complaints patient can be discharged  home with outpatient follow-up.   Here today for follow up.  No specific complaint.  He denies chest pain, shortness of breath, palpitation, dizziness, orthopnea, PND, syncope, lower extremity edema, melena or blood in his stool or urine.  Last day of vancomycin 02/17/18.  Denies fever or chills.  He denies tobacco abuse, alcohol abuse or illicit drug use.  He lives in the apartment.  No family members lives in [place].  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   Past Medical History:  Diagnosis Date  . Congenital heart disease   . Diabetes mellitus without complication ([place])   . Seizures ([place])     Past Surgical History:  Procedure Laterality Date  . open heart surgery    . TEE WITHOUT CARDIOVERSION N/A 01/06/2018   Procedure: TRANSESOPHAGEAL ECHOCARDIOGRAM (TEE);  Surgeon: Josue Hector, MD;  Location: [facility] ENDOSCOPY;  Service: Cardiovascular;  Laterality: N/A;    Current Medications: Prior to Admission medications   Medication Sig Start Date End Date Taking? Authorizing Provider  atorvastatin (LIPITOR) 20 MG tablet Take 1 tablet (20 mg total) by mouth daily at 6 PM. 01/08/18   Lavina Hamman, MD  cefTRIAXone (ROCEPHIN) IVPB Inject 2 g into the vein daily. Indication:  endocarditis Last Day of Therapy:  02/17/18 Labs - Once weekly:  CBC/D and BMP, Labs - Every other week:  ESR and CRP 01/09/18 02/17/18  Lavina Hamman, MD  enoxaparin (LOVENOX) 80  MG/0.8ML injection Inject 0.8 mLs (80 mg total) into the skin daily for 5 days. 01/08/18 01/13/18  Lavina Hamman, MD  insulin glargine (LANTUS) 100 UNIT/ML injection Inject 30 Units into the skin at bedtime.    [provider]  insulin lispro (HUMALOG) 100 UNIT/ML injection Inject 10-30 Units into the skin 3 (three) times daily before meals. Sliding scale    [provider]  Insulin Syringe-Needle U-100 (INSULIN SYRINGE .3CC/31GX5/16") 31G X 5/16" 0.3 ML MISC 1 Act by Does not apply route 4 (four) times daily -  before meals and  at bedtime. 01/09/18   Lavina Hamman, MD  levETIRAcetam (KEPPRA) 500 MG tablet Take 1 tablet (500 mg total) by mouth 2 (two) times daily. 01/08/18   Lavina Hamman, MD  vancomycin IVPB Inject 500 mg into the vein every 8 (eight) hours. Indication:  endocarditis Last Day of Therapy:  02/17/18 Labs - Sunday/Monday:  CBC/D, BMP, and vancomycin trough. Labs - Thursday:  BMP and vancomycin trough Labs - Every other week:  ESR and CRP 01/09/18   Lavina Hamman, MD  warfarin (COUMADIN) 10 MG tablet Take 1 tablet (10 mg total) by mouth daily at 6 PM. Take as Directed by Coumadin Clinic 01/19/18   Nahser, Wonda Cheng, MD    Allergies:   Patient has no known allergies.   Social History   Socioeconomic History  . Marital status: Single    Spouse name: None  . Number of children: None  . Years of education: None  . Highest education level: None  Social Needs  . Financial resource strain: None  . Food insecurity - worry: None  . Food insecurity - inability: None  . Transportation needs - medical: None  . Transportation needs - non-medical: None  Occupational History  . None  Tobacco Use  . Smoking status: Never Smoker  . Smokeless tobacco: Never Used  Substance and Sexual Activity  . Alcohol use: Yes    Frequency: Never    Comment: social  . Drug use: Yes    Types: Marijuana  . Sexual activity: None  Other Topics Concern  . None  Social History Narrative  . None     Family History:  The patient's family history includes Diabetes Mellitus I in his sister.   ROS:   Please see the history of present illness.    ROS All other systems reviewed and are negative.   PHYSICAL EXAM:   VS:  BP 130/84   Pulse 82   Ht _0  (1.676 m)   Wt 123 lb (55.8 kg)   BMI 19.85 kg/m    GEN: Well nourished, well developed, in no acute distress  HEENT: normal  Neck: no JVD, carotid bruits, or masses Cardiac: RRR; no murmurs, rubs, or gallops,no edema  Respiratory:  clear to auscultation  bilaterally, normal work of breathing GI: soft, nontender, nondistended, + BS MS: no deformity or atrophy  Skin: warm and dry, no rash Neuro:  Alert and Oriented x 3, Strength and sensation are intact Psych: euthymic mood, full affect  Wt Readings from Last 3 Encounters:  01/22/18 123 lb (55.8 kg)  01/13/18 123 lb (55.8 kg)  01/05/18 123 lb 0.3 oz (55.8 kg)      Studies/Labs Reviewed:   EKG:  EKG is not ordered today.    Recent Labs: 01/03/2018: ALT 39 01/09/2018: Hemoglobin 15.3; Platelets 258 01/13/2018: BUN 14; Creatinine, Ser 0.96; Potassium 3.6; Sodium 137   Lipid Panel  Component Value Date/Time   CHOL 137 01/03/2018 0623   TRIG 97 01/03/2018 0623   HDL 49 01/03/2018 0623   CHOLHDL 2.8 01/03/2018 0623   VLDL 19 01/03/2018 0623   LDLCALC 69 01/03/2018 0623    Additional studies/ records that were reviewed today include:   Echo 01/04/18: Study Conclusions  - Left ventricle: The cavity size was normal. Wall thickness was normal. Systolic function was normal. The estimated ejection fraction was in the range of 50% to 55%. 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 Interestingly the septal insertion is currently not apically displaced Cannot r/o small residual ASD. - Right atrium: The atrium was severely dilated. - Atrial septum: A septal defect cannot be excluded. - Tricuspid valve: There was severe regurgitation.  TEE 01/06/18: Study Conclusions  - Left ventricle: Systolic function was normal. The estimated ejection fraction was in the range of 50% to 55%. - Left atrium: No evidence of thrombus in the atrial cavity or appendage. - Right ventricle: The RV is severely dilated and hypokinetic - Right atrium: Large thrombus burden in dome of RA. The atrium was massively dilated. - Atrial  septum: There appears to be a large PFO or ASD. There is markedly positive right to left shunting by bubble study High likelyhood of paradoxic emboli caused TIA - Tricuspid valve: The patient is apparently post Ebsteins Anomaly repair at a young age. However the septal leaflet is not apically displaced. The TV is morphologically markedly abnormal. There appears to be an annuloplasty ring. There is failure of leaflet coaptation with wide open TR. The lateral leaflet is thickened with likely 1 cm vegetation. There appears to be a large thrombus burden in the dome of the RA.  ASSESSMENT & PLAN:    1. Ebstein's anomaly/ASD s/p  s/p Cone procedure and partial closure of a secundum ASD (both close with suture) at age 10 @ [place] -Asymptomatic currently.  He will likely need surgical repair again once recover from stroke/endocarditis.  We will send referral to Dr. Joyice Faster at [facility].  Dr. Oval Linsey has already spoke with Dr. Leonides Schanz during recent admission. May need RHC prior to surgery.   2.  Endocarditis of tricuspid valve -Denies fever or chills.  No use of illicit drug.  Continue antibiotic until 4/50/38.  3.  Embolic stroke with large clot burden on RA -Continue Coumadin per pharmacy.  4. Type I DM - Continue insulin    Medication Adjustments/Labs and Tests Ordered: Current medicines are reviewed at length with the patient today.  Concerns regarding medicines are outlined above.  Medication changes, Labs and Tests ordered today are listed in the Patient Instructions below. Patient Instructions  Medication Instructions:  1. Your physician recommends that you continue on your current medications as directed. Please refer to the Current Medication list given to you today.   Labwork: NONE ORDERED TODAY  Testing/Procedures: NONE ORDERED TODAY  Follow-Up: DR. Acie Fredrickson   Any Other Special Instructions Will Be Listed Below (If Applicable).     If you need a  refill on your cardiac medications before your next appointment, please call your pharmacy.      Jarrett Soho, [place]  01/22/2018 9:04 AM    [address], [place], [place]  [address] Phone: [phone]; Fax: [phone]

## 2018-01-22 ENCOUNTER — Ambulatory Visit (INDEPENDENT_AMBULATORY_CARE_PROVIDER_SITE_OTHER): Payer: BLUE CROSS/BLUE SHIELD | Admitting: Physician Assistant

## 2018-01-22 ENCOUNTER — Encounter: Payer: Self-pay | Admitting: Infectious Disease

## 2018-01-22 ENCOUNTER — Encounter: Payer: Self-pay | Admitting: Physician Assistant

## 2018-01-22 VITALS — BP 130/84 | HR 82 | Ht 66.0 in | Wt 123.0 lb

## 2018-01-22 DIAGNOSIS — I513 Intracardiac thrombosis, not elsewhere classified: Secondary | ICD-10-CM

## 2018-01-22 DIAGNOSIS — Q249 Congenital malformation of heart, unspecified: Secondary | ICD-10-CM | POA: Diagnosis not present

## 2018-01-22 DIAGNOSIS — I2690 Septic pulmonary embolism without acute cor pulmonale: Secondary | ICD-10-CM | POA: Diagnosis not present

## 2018-01-22 DIAGNOSIS — I240 Acute coronary thrombosis not resulting in myocardial infarction: Secondary | ICD-10-CM

## 2018-01-22 DIAGNOSIS — I76 Septic arterial embolism: Secondary | ICD-10-CM

## 2018-01-22 NOTE — Patient Instructions (Addendum)
Medication Instructions:  1. Your physician recommends that you continue on your current medications as directed. Please refer to the Current Medication list given to you today.   Labwork: NONE ORDERED TODAY  Testing/Procedures: NONE ORDERED TODAY  Follow-Up: DR. Elease HashimotoNAHSER ON Apr 13, 2018 @ 4:20   A REFERRAL HAS BEEN PLACED FOR DR. CARY WARD WITH [facility] ; THEIR OFFICE WILL CALL YOU WITH AN APPT   Any Other Special Instructions Will Be Listed Below (If Applicable).     If you need a refill on your cardiac medications before your next appointment, please call your pharmacy.

## 2018-01-23 ENCOUNTER — Other Ambulatory Visit: Payer: Self-pay | Admitting: Pharmacist

## 2018-01-26 ENCOUNTER — Telehealth: Payer: Self-pay | Admitting: Cardiovascular Disease

## 2018-01-26 NOTE — Telephone Encounter (Signed)
Lowella BandyNikki (nurse with [facility]) calling,  Pt c/o medication issue:  1. Name of Medication: Atorvastatin   2. How are you currently taking this medication (dosage and times per day)? 20 mg ,1x daily   3. Are you having a reaction (difficulty breathing--STAT)? no  4. What is your medication issue? Lowella Bandyikki states that system reports level 1 drug interaction

## 2018-01-26 NOTE — Telephone Encounter (Signed)
Spoke with Lowella BandyNikki, home health nurse who called to advise that patient has started fluconazole for a fungal infection and that there is an interaction with his atorvastatin. I advised that patient may hold atorvastatin until he infection clears and he completes medication regime. She verbalized understanding and thanked me for the call.

## 2018-01-28 ENCOUNTER — Telehealth: Payer: Self-pay | Admitting: *Deleted

## 2018-01-28 ENCOUNTER — Ambulatory Visit (INDEPENDENT_AMBULATORY_CARE_PROVIDER_SITE_OTHER): Payer: BLUE CROSS/BLUE SHIELD | Admitting: *Deleted

## 2018-01-28 DIAGNOSIS — I513 Intracardiac thrombosis, not elsewhere classified: Secondary | ICD-10-CM | POA: Diagnosis not present

## 2018-01-28 DIAGNOSIS — I639 Cerebral infarction, unspecified: Secondary | ICD-10-CM

## 2018-01-28 DIAGNOSIS — Z7901 Long term (current) use of anticoagulants: Secondary | ICD-10-CM

## 2018-01-28 DIAGNOSIS — I76 Septic arterial embolism: Secondary | ICD-10-CM

## 2018-01-28 DIAGNOSIS — I240 Acute coronary thrombosis not resulting in myocardial infarction: Secondary | ICD-10-CM

## 2018-01-28 DIAGNOSIS — I2690 Septic pulmonary embolism without acute cor pulmonale: Secondary | ICD-10-CM

## 2018-01-28 LAB — POCT INR: INR: 4.1

## 2018-01-28 NOTE — Telephone Encounter (Signed)
[facility] regarding seeing a note in the chart that the pt is on Fluconazole for fungal infection; Lowella Bandyikki stated that the pt is on Ketoconazole cream as needed for 2-3 weeks. Confirmed that the pt is on Ketoconazole not Fluconazole. Advised that we need to notify the RN that took the message on 01/26/18 as it is documented that the pt is on Fluconazole & pt has stopped Atorvastatin due to the interaction. Will forward message to Nahser's RN to update her & have her follow up with the pt regarding the pt holding Atorvastatin.

## 2018-01-28 NOTE — Patient Instructions (Signed)
Description   Do not take any Coumadin today and tomorrow take 5mg  (1/2 tablet) then continue taking 10mg  (1 tablet) daily except 5mg  (1/2 tablet) on Mondays, Wednesdays, and Fridays.  Resume dark green intake & be sure to eat 2 dark greens weekly.  Recheck in 1 week.

## 2018-01-30 ENCOUNTER — Other Ambulatory Visit: Payer: Self-pay | Admitting: Pharmacist

## 2018-02-03 ENCOUNTER — Ambulatory Visit (INDEPENDENT_AMBULATORY_CARE_PROVIDER_SITE_OTHER): Payer: BLUE CROSS/BLUE SHIELD | Admitting: *Deleted

## 2018-02-03 DIAGNOSIS — I513 Intracardiac thrombosis, not elsewhere classified: Secondary | ICD-10-CM

## 2018-02-03 DIAGNOSIS — Z5181 Encounter for therapeutic drug level monitoring: Secondary | ICD-10-CM | POA: Diagnosis not present

## 2018-02-03 DIAGNOSIS — I639 Cerebral infarction, unspecified: Secondary | ICD-10-CM

## 2018-02-03 DIAGNOSIS — I76 Septic arterial embolism: Secondary | ICD-10-CM

## 2018-02-03 DIAGNOSIS — I2690 Septic pulmonary embolism without acute cor pulmonale: Secondary | ICD-10-CM | POA: Diagnosis not present

## 2018-02-03 DIAGNOSIS — Z7901 Long term (current) use of anticoagulants: Secondary | ICD-10-CM | POA: Diagnosis not present

## 2018-02-03 DIAGNOSIS — I240 Acute coronary thrombosis not resulting in myocardial infarction: Secondary | ICD-10-CM

## 2018-02-03 LAB — POCT INR: INR: 2.6

## 2018-02-03 NOTE — Patient Instructions (Signed)
Description   Continue taking 10mg  (1 tablet) daily except 5mg  (1/2 tablet) on Mondays, Wednesdays, and Fridays.  Resume dark green intake & be sure to eat 2 dark greens weekly.  Recheck in 2 weeks.

## 2018-02-04 NOTE — Telephone Encounter (Signed)
Notified patient that per Dr. Elease HashimotoNahser he may d/c Atorvastatin. I advised that it was started by another provider for cholesterol but that per Dr. Elease HashimotoNahser, he has more pressing issues at this time and cholesterol levels can be rechecked at a later time. Patient verbalized understanding and agreement and thanked me for the call.

## 2018-02-06 ENCOUNTER — Other Ambulatory Visit: Payer: Self-pay | Admitting: Pharmacist

## 2018-02-09 ENCOUNTER — Encounter: Payer: Self-pay | Admitting: Infectious Disease

## 2018-02-11 ENCOUNTER — Other Ambulatory Visit: Payer: Self-pay | Admitting: Pharmacist

## 2018-02-13 ENCOUNTER — Other Ambulatory Visit: Payer: Self-pay | Admitting: Pharmacist

## 2018-02-16 ENCOUNTER — Ambulatory Visit (INDEPENDENT_AMBULATORY_CARE_PROVIDER_SITE_OTHER): Payer: BLUE CROSS/BLUE SHIELD | Admitting: *Deleted

## 2018-02-16 DIAGNOSIS — I513 Intracardiac thrombosis, not elsewhere classified: Secondary | ICD-10-CM

## 2018-02-16 DIAGNOSIS — I2690 Septic pulmonary embolism without acute cor pulmonale: Secondary | ICD-10-CM | POA: Diagnosis not present

## 2018-02-16 DIAGNOSIS — Z7901 Long term (current) use of anticoagulants: Secondary | ICD-10-CM

## 2018-02-16 DIAGNOSIS — I639 Cerebral infarction, unspecified: Secondary | ICD-10-CM | POA: Diagnosis not present

## 2018-02-16 DIAGNOSIS — I76 Septic arterial embolism: Secondary | ICD-10-CM

## 2018-02-16 DIAGNOSIS — I240 Acute coronary thrombosis not resulting in myocardial infarction: Secondary | ICD-10-CM

## 2018-02-16 LAB — POCT INR: INR: 2.7

## 2018-02-16 NOTE — Patient Instructions (Signed)
Description   Continue taking 10mg  (1 tablet) daily except 5mg  (1/2 tablet) on Mondays, Wednesdays, and Fridays.  Resume dark green intake & be sure to eat 2 dark greens weekly.  Recheck in 3 weeks.

## 2018-02-19 ENCOUNTER — Ambulatory Visit (INDEPENDENT_AMBULATORY_CARE_PROVIDER_SITE_OTHER): Payer: BLUE CROSS/BLUE SHIELD | Admitting: Infectious Disease

## 2018-02-19 ENCOUNTER — Encounter: Payer: Self-pay | Admitting: Infectious Disease

## 2018-02-19 ENCOUNTER — Telehealth: Payer: Self-pay | Admitting: Behavioral Health

## 2018-02-19 ENCOUNTER — Encounter: Payer: Self-pay | Admitting: Behavioral Health

## 2018-02-19 VITALS — BP 141/85 | HR 103 | Temp 98.2°F | Ht 67.0 in | Wt 127.0 lb

## 2018-02-19 DIAGNOSIS — I513 Intracardiac thrombosis, not elsewhere classified: Secondary | ICD-10-CM | POA: Diagnosis not present

## 2018-02-19 DIAGNOSIS — I639 Cerebral infarction, unspecified: Secondary | ICD-10-CM | POA: Diagnosis not present

## 2018-02-19 DIAGNOSIS — I240 Acute coronary thrombosis not resulting in myocardial infarction: Secondary | ICD-10-CM

## 2018-02-19 DIAGNOSIS — Q225 Ebstein's anomaly: Secondary | ICD-10-CM

## 2018-02-19 DIAGNOSIS — I330 Acute and subacute infective endocarditis: Secondary | ICD-10-CM

## 2018-02-19 NOTE — Telephone Encounter (Signed)
Called [facility] and informed her that patient had PICC line removed in the office today.  Coretta verbalized understanding. Angeline SlimAshley Maxamilian Amadon RN

## 2018-02-19 NOTE — Progress Notes (Signed)
Chief complaint: followup for culture negative endocarditis  Subjective:    Patient ID: Manuel Baker, male    DOB: 11/06/1996, 22 y.o.   MRN: [id number]  HPI  22 y.o. male with PMH of Ebstein's anomaly with ?partial repair, T1DM, h/o seizures admitted for possible seizure and found to have multiple bil punctate cardioembolic infarcts w/o residual deficits; TEE shows 1cm TV vegetation, significant right atrial clot burden with atrial dilation, PFO vs ASD. 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 His labs for unusual agents such as Bartonella and Coxiella were negative. He never had epidemiological risks for these. I offered to repeat these labs but we both felt like there was very little point to this.    Past Medical History:  Diagnosis Date  . Congenital heart disease   . Diabetes mellitus without complication ([place])   . Seizures ([place])     Past Surgical History:  Procedure Laterality Date  . open heart surgery    . TEE WITHOUT CARDIOVERSION N/A 01/06/2018   Procedure: TRANSESOPHAGEAL ECHOCARDIOGRAM (TEE);  Surgeon: Josue Hector, MD;  Location: [facility] ENDOSCOPY;  Service: Cardiovascular;  Laterality: N/A;    Family History  Problem Relation Age of Onset  . Diabetes Mellitus I Sister       Social History   Socioeconomic History  . Marital status: Single    Spouse name: Not on file  . Number of children: Not on file  . Years of education: Not on file  . Highest education level: Not on file  Occupational History  . Not on file  Social Needs  . Financial resource strain: Not on file  . Food insecurity:    Worry: Not on file    Inability: Not on file  . Transportation needs:    Medical: Not on file    Non-medical: Not on file  Tobacco Use  .  Smoking status: Never Smoker  . Smokeless tobacco: Never Used  Substance and Sexual Activity  . Alcohol use: Yes    Frequency: Never    Comment: social  . Drug use: Yes    Types: Marijuana  . Sexual activity: Not on file  Lifestyle  . Physical activity:    Days per week: Not on file    Minutes per session: Not on file  . Stress: Not on file  Relationships  . Social connections:    Talks on phone: Not on file    Gets together: Not on file    Attends religious service: Not on file    Active member of club or organization: Not on file    Attends meetings of clubs or organizations: Not on file    Relationship status: Not on file  Other Topics Concern  . Not on file  Social History Narrative  . Not on file    No Known Allergies   Current Outpatient Medications:  .  insulin glargine (LANTUS) 100 UNIT/ML injection, Inject 30 Units into the skin at bedtime., Disp: , Rfl:  .  insulin lispro (HUMALOG) 100 UNIT/ML injection, Inject 10-30 Units into the skin 3 (three) times daily before meals. Sliding scale, Disp: , Rfl:  .  Insulin Syringe-Needle U-100 (INSULIN SYRINGE .3CC/31GX5/16") 31G X 5/16" 0.3 ML MISC, 1 Act by Does not apply route  4 (four) times daily -  before meals and at bedtime., Disp: 300 each, Rfl: 0 .  levETIRAcetam (KEPPRA) 500 MG tablet, Take 1 tablet (500 mg total) by mouth 2 (two) times daily., Disp: 60 tablet, Rfl: 0 .  vancomycin IVPB, Inject 500 mg into the vein every 8 (eight) hours. Indication:  endocarditis Last Day of Therapy:  02/17/18 Labs - Sunday/Monday:  CBC/D, BMP, and vancomycin trough. Labs - Thursday:  BMP and vancomycin trough Labs - Every other week:  ESR and CRP, Disp: 117 Units, Rfl: 0 .  warfarin (COUMADIN) 10 MG tablet, Take 1 tablet (10 mg total) by mouth daily at 6 PM. Take as Directed by Coumadin Clinic, Disp: 30 tablet, Rfl: 0    Review of Systems  Constitutional: Negative for activity change, appetite change, chills, diaphoresis, fatigue,  fever and unexpected weight change.  HENT: Negative for congestion, rhinorrhea, sinus pressure, sneezing, sore throat and trouble swallowing.   Eyes: Negative for photophobia and visual disturbance.  Respiratory: Negative for cough, chest tightness, shortness of breath, wheezing and stridor.   Cardiovascular: Negative for chest pain, palpitations and leg swelling.  Gastrointestinal: Negative for abdominal distention, abdominal pain, anal bleeding, blood in stool, constipation, diarrhea, nausea and vomiting.  Genitourinary: Negative for difficulty urinating, dysuria, flank pain and hematuria.  Musculoskeletal: Negative for arthralgias, back pain, gait problem, joint swelling and myalgias.  Skin: Negative for color change, pallor, rash and wound.  Neurological: Negative for dizziness, tremors, weakness and light-headedness.  Hematological: Negative for adenopathy. Does not bruise/bleed easily.  Psychiatric/Behavioral: Negative for agitation, behavioral problems, confusion, decreased concentration, dysphoric mood and sleep disturbance.       Objective:   Physical Exam  Constitutional: He is oriented to person, place, and time. He appears well-developed and well-nourished.  HENT:  Head: Normocephalic and atraumatic.  Eyes: Conjunctivae and EOM are normal.  Neck: Normal range of motion. Neck supple.  Cardiovascular: Normal rate and regular rhythm.  Murmur heard. Pulmonary/Chest: Effort normal and breath sounds normal. No respiratory distress. He has no wheezes. He has no rales. He exhibits no tenderness.  Abdominal: Soft. Bowel sounds are normal. He exhibits no distension. There is no tenderness.  Musculoskeletal: Normal range of motion. He exhibits no edema or tenderness.  Neurological: He is alert and oriented to person, place, and time.  Skin: Skin is warm and dry. No rash noted. No erythema. No pallor.  Psychiatric: His behavior is normal. Judgment and thought content normal.    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 He is on warfarin  He has finished IV abx  We will DC PICC; and he can follouwp PRN  Ebsteins anomaly: sp repair and still may need repair to be determined by Cardiology and CT surgery ultimately

## 2018-02-19 NOTE — Progress Notes (Signed)
Order to remove PICC line obtained from Dr. Algis LimingVanDam.  Patient identified with name and date of birth. PICC dressing removed, site unremarkable.  PICC line removed using sterile procedure @ 1430.  PICC length equal to that noted in patient's hospital chart of 42 cm. Sterile petroleum gauze + sterile 4X4 applied to PICC site, pressure applied for 10 minutes and covered with Medipore tape as a pressure dressing.  Patient tolerated procedure without complaints.  Patient instructed to limit use of arm for 1 hour. Patient instructed that the pressure dressing should remain in place for 24 hours. Patient verbalized understanding of these instructions.

## 2018-02-25 ENCOUNTER — Ambulatory Visit: Payer: Self-pay | Admitting: Neurology

## 2018-02-25 ENCOUNTER — Encounter: Payer: Self-pay | Admitting: Neurology

## 2018-02-26 ENCOUNTER — Telehealth: Payer: Self-pay

## 2018-02-26 NOTE — Telephone Encounter (Signed)
Patient no show for appt on 02/25/2018. 

## 2018-03-04 ENCOUNTER — Other Ambulatory Visit: Payer: Self-pay | Admitting: *Deleted

## 2018-03-04 MED ORDER — WARFARIN SODIUM 10 MG PO TABS: ORAL_TABLET | ORAL | 2 refills | Status: DC

## 2018-03-09 ENCOUNTER — Ambulatory Visit (INDEPENDENT_AMBULATORY_CARE_PROVIDER_SITE_OTHER): Payer: BLUE CROSS/BLUE SHIELD | Admitting: *Deleted

## 2018-03-09 DIAGNOSIS — I639 Cerebral infarction, unspecified: Secondary | ICD-10-CM | POA: Diagnosis not present

## 2018-03-09 DIAGNOSIS — I240 Acute coronary thrombosis not resulting in myocardial infarction: Secondary | ICD-10-CM

## 2018-03-09 DIAGNOSIS — I76 Septic arterial embolism: Secondary | ICD-10-CM | POA: Diagnosis not present

## 2018-03-09 DIAGNOSIS — Z7901 Long term (current) use of anticoagulants: Secondary | ICD-10-CM

## 2018-03-09 DIAGNOSIS — I513 Intracardiac thrombosis, not elsewhere classified: Secondary | ICD-10-CM

## 2018-03-09 LAB — POCT INR: INR: 1.5

## 2018-03-09 NOTE — Patient Instructions (Addendum)
Description   Today take 1 tablet, tomorrow take 1.5 tablets,  then continue taking 1 tablet daily except 1/2 tablet on Mondays, Wednesdays, and Fridays.  Recheck in 2 weeks. Call us with any questions or concerns to Coumadin Clinic @ [phone].

## 2018-03-23 ENCOUNTER — Ambulatory Visit (INDEPENDENT_AMBULATORY_CARE_PROVIDER_SITE_OTHER): Payer: BLUE CROSS/BLUE SHIELD | Admitting: *Deleted

## 2018-03-23 DIAGNOSIS — I639 Cerebral infarction, unspecified: Secondary | ICD-10-CM | POA: Diagnosis not present

## 2018-03-23 DIAGNOSIS — I513 Intracardiac thrombosis, not elsewhere classified: Secondary | ICD-10-CM

## 2018-03-23 DIAGNOSIS — Z7901 Long term (current) use of anticoagulants: Secondary | ICD-10-CM

## 2018-03-23 DIAGNOSIS — I76 Septic arterial embolism: Secondary | ICD-10-CM | POA: Diagnosis not present

## 2018-03-23 DIAGNOSIS — I240 Acute coronary thrombosis not resulting in myocardial infarction: Secondary | ICD-10-CM

## 2018-03-23 LAB — POCT INR: INR: 3.2

## 2018-03-23 NOTE — Patient Instructions (Signed)
Description   Skip today's dose,  then continue taking 1 tablet daily except 1/2 tablet on Mondays, Wednesdays, and Fridays.  Recheck in 2 weeks. Call us with any questions or concerns to Coumadin Clinic @ [phone].

## 2018-03-31 ENCOUNTER — Telehealth: Payer: Self-pay | Admitting: Nurse Practitioner

## 2018-03-31 NOTE — Telephone Encounter (Signed)
Left message for Manuel Baker with [facility] that Dr. Elease Hashimoto is not the attending physician for the patient's home health orders. I advised her to call back with questions or concerns.

## 2018-04-07 ENCOUNTER — Ambulatory Visit: Payer: BLUE CROSS/BLUE SHIELD | Admitting: Cardiovascular Disease

## 2018-04-08 ENCOUNTER — Ambulatory Visit (INDEPENDENT_AMBULATORY_CARE_PROVIDER_SITE_OTHER): Payer: BLUE CROSS/BLUE SHIELD | Admitting: *Deleted

## 2018-04-08 DIAGNOSIS — I513 Intracardiac thrombosis, not elsewhere classified: Secondary | ICD-10-CM | POA: Diagnosis not present

## 2018-04-08 DIAGNOSIS — I76 Septic arterial embolism: Secondary | ICD-10-CM

## 2018-04-08 DIAGNOSIS — Z7901 Long term (current) use of anticoagulants: Secondary | ICD-10-CM

## 2018-04-08 DIAGNOSIS — I639 Cerebral infarction, unspecified: Secondary | ICD-10-CM

## 2018-04-08 DIAGNOSIS — I240 Acute coronary thrombosis not resulting in myocardial infarction: Secondary | ICD-10-CM

## 2018-04-08 LAB — POCT INR: INR: 1.5

## 2018-04-08 NOTE — Patient Instructions (Signed)
Description   Today take 1 tablet and tomorrow take 1.5 tablets then continue taking 1 tablet daily except 1/2 tablet on Mondays, Wednesdays, and Fridays.  Recheck in 1 week. Call us with any questions or concerns to Coumadin Clinic @ [phone].      

## 2018-04-13 ENCOUNTER — Ambulatory Visit (INDEPENDENT_AMBULATORY_CARE_PROVIDER_SITE_OTHER): Payer: BLUE CROSS/BLUE SHIELD | Admitting: *Deleted

## 2018-04-13 ENCOUNTER — Encounter: Payer: Self-pay | Admitting: Cardiovascular Disease

## 2018-04-13 ENCOUNTER — Encounter (INDEPENDENT_AMBULATORY_CARE_PROVIDER_SITE_OTHER): Payer: Self-pay

## 2018-04-13 ENCOUNTER — Ambulatory Visit (INDEPENDENT_AMBULATORY_CARE_PROVIDER_SITE_OTHER): Payer: BLUE CROSS/BLUE SHIELD | Admitting: Cardiovascular Disease

## 2018-04-13 VITALS — BP 110/70 | HR 100 | Ht 67.0 in | Wt 124.8 lb

## 2018-04-13 DIAGNOSIS — Q211 Atrial septal defect, unspecified: Secondary | ICD-10-CM

## 2018-04-13 DIAGNOSIS — I76 Septic arterial embolism: Secondary | ICD-10-CM

## 2018-04-13 DIAGNOSIS — Z7901 Long term (current) use of anticoagulants: Secondary | ICD-10-CM | POA: Diagnosis not present

## 2018-04-13 DIAGNOSIS — I513 Intracardiac thrombosis, not elsewhere classified: Secondary | ICD-10-CM

## 2018-04-13 DIAGNOSIS — I639 Cerebral infarction, unspecified: Secondary | ICD-10-CM

## 2018-04-13 DIAGNOSIS — Q225 Ebstein's anomaly: Secondary | ICD-10-CM | POA: Diagnosis not present

## 2018-04-13 DIAGNOSIS — I240 Acute coronary thrombosis not resulting in myocardial infarction: Secondary | ICD-10-CM

## 2018-04-13 LAB — POCT INR: INR: 2.6 (ref 2.0–3.0)

## 2018-04-13 NOTE — Patient Instructions (Signed)
Description   Continue taking 1 tablet daily except 1/2 tablet on Mondays, Wednesdays, and Fridays.  Recheck in 2 weeks. Call us with any questions or concerns to Coumadin Clinic @ [phone].

## 2018-04-13 NOTE — Patient Instructions (Signed)
Medication Instructions:  Your physician recommends that you continue on your current medications as directed. Please refer to the Current Medication list given to you today.   Labwork: None Ordered   Testing/Procedures: None Ordered   Follow-Up: Your physician recommends that you schedule a follow-up appointment in: 3-4 months with Dr. Nahser   If you need a refill on your cardiac medications before your next appointment, please call your pharmacy.   Thank you for choosing [facility]! Beryl Hornberger, RN [phone]    

## 2018-04-13 NOTE — Progress Notes (Signed)
Cardiology Office Note:    Date:  04/13/2018   ID:  Suanne Marker, DOB [birth year], MRN [id number]  PCP:  Patient, No Pcp Per  Cardiologist:  Kristeen Miss, MD   Referring MD: No ref. provider found   Chief Complaint  Patient presents with  . Ebsteins anomaly  . Atrial Septal Defect  . Endocarditis    History of Present Illness:     Manuel Baker is a 22 y.o. 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  at the request of No ref. provider found.  He was admitted to the hospital in Feb. 2019  with seizures and a CVA MRI showed multiple bilateral infarcts.  Transesophageal echo revealed severe right ventricular failure with wide-open tricuspid regurgitation.  There was a vegetation on the tricuspid valve.  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  He was to go to the congenital heart clinic at [facility].  They have called him but he had finals and did not have a way to get to [facility] yet.  Has recovered from the CVA .    Past Medical History:  Diagnosis Date  . Congenital heart disease   . Diabetes mellitus without complication (HCC)   . Seizures (HCC)     Past Surgical History:  Procedure Laterality Date  . open heart surgery    . TEE WITHOUT CARDIOVERSION N/A 01/06/2018   Procedure: TRANSESOPHAGEAL ECHOCARDIOGRAM (TEE);  Surgeon: Wendall Stade, MD;  Location: [facility] ENDOSCOPY;  Service: Cardiovascular;  Laterality: N/A;    Current Medications: Current Meds  Medication Sig  . atorvastatin (LIPITOR) 20 MG tablet Take 1 tablet by mouth daily.  . insulin glargine (LANTUS) 100 UNIT/ML injection Inject 30 Units into the skin at bedtime.  . insulin lispro (HUMALOG) 100 UNIT/ML injection Inject 10-30 Units into the skin 3 (three) times daily before meals. Sliding scale  . Insulin Syringe-Needle  U-100 (INSULIN SYRINGE .3CC/31GX5/16") 31G X 5/16" 0.3 ML MISC 1 Act by Does not apply route 4 (four) times daily -  before meals and at bedtime.  . levETIRAcetam (KEPPRA) 500 MG tablet Take 1 tablet (500 mg total) by mouth 2 (two) times daily.  Marland Kitchen warfarin (COUMADIN) 10 MG tablet Take as Directed by [facility]madin Clinic     Allergies:   Patient has no known allergies.   Social History   Socioeconomic History  . Marital status: Single    Spouse name: Not on file  . Number of children: Not on file  . Years of education: Not on file  . Highest education level: Not on file  Occupational History  . Not on file  Social Needs  . Financial resource strain: Not on file  . Food insecurity:    Worry: Not on file    Inability: Not on file  . Transportation needs:    Medical: Not on file    Non-medical: Not on file  Tobacco Use  . Smoking status: Never Smoker  . Smokeless tobacco: Never Used  Substance and Sexual Activity  . Alcohol use: Yes    Frequency: Never    Comment: social  . Drug use: Yes    Types: Marijuana  . Sexual activity: Not on file  Lifestyle  . Physical activity:    Days per week: Not on file  Minutes per session: Not on file  . Stress: Not on file  Relationships  . Social connections:    Talks on phone: Not on file    Gets together: Not on file    Attends religious service: Not on file    Active member of club or organization: Not on file    Attends meetings of clubs or organizations: Not on file    Relationship status: Not on file  Other Topics Concern  . Not on file  Social History Narrative  . Not on file     Family History: The patient's family history includes Diabetes Mellitus I in his sister.  ROS:   Please see the history of present illness.     All other systems reviewed and are negative.  EKGs/Labs/Other Studies Reviewed:    The following studies were reviewed today:   EKG:     Recent Labs: 01/03/2018: ALT 39 01/09/2018: Hemoglobin 15.3;  Platelets 258 01/13/2018: BUN 14; Creatinine, Ser 0.96; Potassium 3.6; Sodium 137  Recent Lipid Panel    Component Value Date/Time   CHOL 137 01/03/2018 0623   TRIG 97 01/03/2018 0623   HDL 49 01/03/2018 0623   CHOLHDL 2.8 01/03/2018 0623   VLDL 19 01/03/2018 0623   LDLCALC 69 01/03/2018 0623    Physical Exam:    VS:  BP 110/70   Pulse 100   Ht  (1.702 m)   Wt 124 lb 12.8 oz (56.6 kg)   SpO2 90%   BMI 19.55 kg/m     Wt Readings from Last 3 Encounters:  04/13/18 124 lb 12.8 oz (56.6 kg)  02/19/18 127 lb (57.6 kg)  01/22/18 123 lb (55.8 kg)     GEN: young male,  Normal  HEENT: Normal NECK: No JVD; No carotid bruits LYMPHATICS: No lymphadenopathy CARDIAC: healed sternotomy , RR,  Right sided S3.   3/6 systolic murmur that increases dramatically with deep breath  RESPIRATORY:  Clear to auscultation without rales, wheezing or rhonchi  ABDOMEN: Soft, non-tender, non-distended MUSCULOSKELETAL:  No edema; No deformity  SKIN: Warm and dry NEUROLOGIC:  Alert and oriented x 3 PSYCHIATRIC:  Normal affect   ASSESSMENT:    No diagnosis found. PLAN:    In order of problems listed above:  1. Epstein's anomaly: The patient has a history of Epstein's anomaly.  Has esophageal echo feels severe right ventricular enlargement and right ventricular failure.  He has a very large ASD.  He has significant right to left shunting.  His oxygen saturation here in the office today is 90% which is indicative of significant shunting.  He needs to be evaluated in an adult congenital heart clinic.    He has significant transportation issues.  He does not have a car and does not have funds to go to do.  He is hoping to get a car next month.  We will refer him to the [facility] congenital heart clinic.  They are in [place] once or twice a month.  I sent his contact information and name to Dr. Gala Romney who get in touch with the [facility] clinic.  I will see him again in 3 months.   Medication  Adjustments/Labs and Tests Ordered: Current medicines are reviewed at length with the patient today.  Concerns regarding medicines are outlined above.  No orders of the defined types were placed in this encounter.  No orders of the defined types were placed in this encounter.   Patient Instructions  Medication Instructions:  Your physician  recommends that you continue on your current medications as directed. Please refer to the Current Medication list given to you today.   Labwork: None Ordered   Testing/Procedures: None Ordered   Follow-Up: Your physician recommends that you schedule a follow-up appointment in: 3-4 months with Dr. Elease Hashimoto   If you need a refill on your cardiac medications before your next appointment, please call your pharmacy.   Thank you for choosing [facility]! Eligha Bridegroom, RN [phone]       Signed, Kristeen Miss, MD  04/13/2018 5:43 PM    [facility]

## 2018-05-19 ENCOUNTER — Ambulatory Visit (INDEPENDENT_AMBULATORY_CARE_PROVIDER_SITE_OTHER): Payer: Self-pay | Admitting: *Deleted

## 2018-05-19 DIAGNOSIS — I513 Intracardiac thrombosis, not elsewhere classified: Secondary | ICD-10-CM

## 2018-05-19 DIAGNOSIS — I240 Acute coronary thrombosis not resulting in myocardial infarction: Secondary | ICD-10-CM

## 2018-05-19 DIAGNOSIS — I76 Septic arterial embolism: Secondary | ICD-10-CM

## 2018-05-19 DIAGNOSIS — Z7901 Long term (current) use of anticoagulants: Secondary | ICD-10-CM

## 2018-05-19 DIAGNOSIS — I639 Cerebral infarction, unspecified: Secondary | ICD-10-CM

## 2018-05-19 LAB — POCT INR: INR: 1.1 — AB (ref 2.0–3.0)

## 2018-05-19 MED ORDER — WARFARIN SODIUM 10 MG PO TABS: ORAL_TABLET | ORAL | 1 refills | Status: DC

## 2018-05-19 NOTE — Patient Instructions (Signed)
Description   Today take 1.5 tablets, tomorrow take 1 tablet, then Continue taking 1 tablet daily except 1/2 tablet on Mondays, Wednesdays, and Fridays.  Recheck in 1 week. Call us with any questions or concerns to Coumadin Clinic @ [phone].

## 2018-08-04 DIAGNOSIS — J069 Acute upper respiratory infection, unspecified: Secondary | ICD-10-CM | POA: Diagnosis not present

## 2018-08-10 ENCOUNTER — Ambulatory Visit: Payer: BLUE CROSS/BLUE SHIELD | Admitting: Cardiovascular Disease

## 2018-08-27 ENCOUNTER — Encounter (HOSPITAL_COMMUNITY): Payer: Self-pay | Admitting: Emergency Medicine

## 2018-08-27 ENCOUNTER — Other Ambulatory Visit: Payer: Self-pay

## 2018-08-27 ENCOUNTER — Emergency Department (HOSPITAL_COMMUNITY): Payer: BLUE CROSS/BLUE SHIELD

## 2018-08-27 ENCOUNTER — Emergency Department (HOSPITAL_COMMUNITY)
Admission: EM | Admit: 2018-08-27 | Discharge: 2018-08-28 | Disposition: A | Discharge status: Home / Self Care | Payer: BLUE CROSS/BLUE SHIELD | Attending: Emergency Medicine | Admitting: Emergency Medicine

## 2018-08-27 DIAGNOSIS — Y999 Unspecified external cause status: Secondary | ICD-10-CM | POA: Diagnosis not present

## 2018-08-27 DIAGNOSIS — S299XXA Unspecified injury of thorax, initial encounter: Secondary | ICD-10-CM | POA: Diagnosis not present

## 2018-08-27 DIAGNOSIS — M546 Pain in thoracic spine: Secondary | ICD-10-CM | POA: Diagnosis not present

## 2018-08-27 DIAGNOSIS — S22040A Wedge compression fracture of fourth thoracic vertebra, initial encounter for closed fracture: Secondary | ICD-10-CM | POA: Diagnosis not present

## 2018-08-27 DIAGNOSIS — Y9389 Activity, other specified: Secondary | ICD-10-CM | POA: Diagnosis not present

## 2018-08-27 DIAGNOSIS — Y92410 Unspecified street and highway as the place of occurrence of the external cause: Secondary | ICD-10-CM | POA: Diagnosis not present

## 2018-08-27 DIAGNOSIS — Z8673 Personal history of transient ischemic attack (TIA), and cerebral infarction without residual deficits: Secondary | ICD-10-CM | POA: Diagnosis not present

## 2018-08-27 DIAGNOSIS — R739 Hyperglycemia, unspecified: Secondary | ICD-10-CM

## 2018-08-27 DIAGNOSIS — S199XXA Unspecified injury of neck, initial encounter: Secondary | ICD-10-CM | POA: Diagnosis not present

## 2018-08-27 DIAGNOSIS — E1065 Type 1 diabetes mellitus with hyperglycemia: Secondary | ICD-10-CM | POA: Diagnosis not present

## 2018-08-27 DIAGNOSIS — Z7901 Long term (current) use of anticoagulants: Secondary | ICD-10-CM | POA: Insufficient documentation

## 2018-08-27 DIAGNOSIS — Z794 Long term (current) use of insulin: Secondary | ICD-10-CM | POA: Insufficient documentation

## 2018-08-27 DIAGNOSIS — M549 Dorsalgia, unspecified: Secondary | ICD-10-CM | POA: Diagnosis not present

## 2018-08-27 DIAGNOSIS — S22040D Wedge compression fracture of fourth thoracic vertebra, subsequent encounter for fracture with routine healing: Secondary | ICD-10-CM

## 2018-08-27 DIAGNOSIS — M542 Cervicalgia: Secondary | ICD-10-CM | POA: Diagnosis not present

## 2018-08-27 DIAGNOSIS — S3992XA Unspecified injury of lower back, initial encounter: Secondary | ICD-10-CM | POA: Diagnosis not present

## 2018-08-27 DIAGNOSIS — Z86711 Personal history of pulmonary embolism: Secondary | ICD-10-CM | POA: Insufficient documentation

## 2018-08-27 DIAGNOSIS — E1165 Type 2 diabetes mellitus with hyperglycemia: Secondary | ICD-10-CM | POA: Diagnosis not present

## 2018-08-27 DIAGNOSIS — Z79899 Other long term (current) drug therapy: Secondary | ICD-10-CM | POA: Diagnosis not present

## 2018-08-27 LAB — CBG MONITORING, ED: GLUCOSE-CAPILLARY: 501 mg/dL — AB (ref 70–99)

## 2018-08-27 MED ORDER — IBUPROFEN 400 MG PO TABS
600.0000 mg | ORAL_TABLET | Freq: Once | ORAL | Status: AC
  Administered 2018-08-27: 600 mg via ORAL
  Filled 2018-08-27: qty 1
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
## 2018-08-27 NOTE — ED Notes (Signed)
Notified PA of elevated CBG

## 2018-08-28 ENCOUNTER — Emergency Department (HOSPITAL_COMMUNITY): Payer: BLUE CROSS/BLUE SHIELD

## 2018-08-28 DIAGNOSIS — S299XXA Unspecified injury of thorax, initial encounter: Secondary | ICD-10-CM | POA: Diagnosis not present

## 2018-08-28 LAB — URINALYSIS, ROUTINE W REFLEX MICROSCOPIC
Bacteria, UA: NONE SEEN
Bilirubin Urine: NEGATIVE
Glucose, UA: >=500 mg/dL — AB
Hgb urine dipstick: NEGATIVE
KETONES UR: NEGATIVE mg/dL
LEUKOCYTES UA: NEGATIVE
NITRITE: NEGATIVE
PROTEIN: NEGATIVE mg/dL
Specific Gravity, Urine: 1.023 (ref 1.005–1.030)
pH: 5 (ref 5.0–8.0)

## 2018-08-28 LAB — CBC WITH DIFFERENTIAL/PLATELET
ABS IMMATURE GRANULOCYTES: 0 10*3/uL (ref 0.0–0.1)
BASOS ABS: 0.1 10*3/uL (ref 0.0–0.1)
BASOS PCT: 1 %
Eosinophils Absolute: 0.1 10*3/uL (ref 0.0–0.7)
Eosinophils Relative: 3 %
HCT: 44.7 % (ref 39.0–52.0)
Hemoglobin: 15.4 g/dL (ref 13.0–17.0)
Immature Granulocytes: 0 %
Lymphocytes Relative: 43 %
Lymphs Abs: 1.9 10*3/uL (ref 0.7–4.0)
MCH: 27.9 pg (ref 26.0–34.0)
MCHC: 34.5 g/dL (ref 30.0–36.0)
MCV: 81 fL (ref 78.0–100.0)
MONO ABS: 0.3 10*3/uL (ref 0.1–1.0)
Monocytes Relative: 7 %
NEUTROS ABS: 2 10*3/uL (ref 1.7–7.7)
Neutrophils Relative %: 46 %
PLATELETS: 281 10*3/uL (ref 150–400)
RBC: 5.52 MIL/uL (ref 4.22–5.81)
RDW: 13.2 % (ref 11.5–15.5)
WBC: 4.4 10*3/uL (ref 4.0–10.5)

## 2018-08-28 LAB — BASIC METABOLIC PANEL
Anion gap: 8 (ref 5–15)
BUN: 13 mg/dL (ref 6–20)
CO2: 19 mmol/L — ABNORMAL LOW (ref 22–32)
Calcium: 8.7 mg/dL — ABNORMAL LOW (ref 8.9–10.3)
Chloride: 106 mmol/L (ref 98–111)
Creatinine, Ser: 1.36 mg/dL — ABNORMAL HIGH (ref 0.61–1.24)
GFR calc non Af Amer: >60 mL/min (ref >60)
Glucose, Bld: 500 mg/dL — ABNORMAL HIGH (ref 70–99)
POTASSIUM: 4.5 mmol/L (ref 3.5–5.1)
SODIUM: 133 mmol/L — AB (ref 135–145)

## 2018-08-28 LAB — I-STAT CHEM 8, ED
BUN: 14 mg/dL (ref 6–20)
CREATININE: 1.3 mg/dL — AB (ref 0.61–1.24)
Calcium, Ion: 1.12 mmol/L — ABNORMAL LOW (ref 1.15–1.40)
Chloride: 105 mmol/L (ref 98–111)
Glucose, Bld: 522 mg/dL (ref 70–99)
HEMATOCRIT: 47 % (ref 39.0–52.0)
Hemoglobin: 16 g/dL (ref 13.0–17.0)
POTASSIUM: 4.6 mmol/L (ref 3.5–5.1)
Sodium: 134 mmol/L — ABNORMAL LOW (ref 135–145)
TCO2: 19 mmol/L — AB (ref 22–32)

## 2018-08-28 LAB — CBG MONITORING, ED: GLUCOSE-CAPILLARY: 362 mg/dL — AB (ref 70–99)

## 2018-08-28 MED ORDER — IBUPROFEN 600 MG PO TABS: 600.0000 mg | ORAL_TABLET | Freq: Four times a day (QID) | ORAL | 0 refills | Status: DC | PRN

## 2018-08-28 MED ORDER — INSULIN ASPART 100 UNIT/ML ~~LOC~~ SOLN
14.0000 [IU] | Freq: Once | SUBCUTANEOUS | Status: AC
  Administered 2018-08-28: 14 [IU] via SUBCUTANEOUS
  Filled 2018-08-28: qty 1
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
## 2018-08-28 NOTE — ED Notes (Signed)
Pt CBG was 362, notified Emilie(RN)

## 2018-08-28 NOTE — Discharge Instructions (Addendum)
Your work-up today is overall reassuring, you have a chronic compression fracture at T4 but no evidence of acute fractures or MRI does show a bone contusion at T6.  You can continue to take ibuprofen 600 mg every 6 hours as needed for pain, do not combine with any other over-the-counter pain medications aside from Tylenol.  If pain is still not improving you can follow-up with neurosurgery otherwise this should continue to improve over the next week or so.  Your blood sugar was elevated today but there is no evidence of DKA.  Please take your bedtime Lantus dose when you arrive at home and then continue with your usual insulin regimen tomorrow, follow-up with your regular doctor if you continue to have difficulty controlling her blood sugars, and return to the emergency department if it is significantly elevated.

## 2018-08-28 NOTE — ED Notes (Signed)
Patient left at this time with all belongings. 
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
## 2018-10-12 DIAGNOSIS — Z113 Encounter for screening for infections with a predominantly sexual mode of transmission: Secondary | ICD-10-CM | POA: Diagnosis not present

## 2018-10-15 ENCOUNTER — Emergency Department (HOSPITAL_COMMUNITY)
Admission: EM | Admit: 2018-10-15 | Discharge: 2018-10-15 | Disposition: A | Discharge status: Home / Self Care | Payer: BLUE CROSS/BLUE SHIELD | Attending: Emergency Medicine | Admitting: Emergency Medicine

## 2018-10-15 ENCOUNTER — Encounter (HOSPITAL_COMMUNITY): Payer: Self-pay

## 2018-10-15 ENCOUNTER — Emergency Department (HOSPITAL_COMMUNITY): Payer: BLUE CROSS/BLUE SHIELD

## 2018-10-15 ENCOUNTER — Other Ambulatory Visit: Payer: Self-pay

## 2018-10-15 DIAGNOSIS — Q249 Congenital malformation of heart, unspecified: Secondary | ICD-10-CM | POA: Insufficient documentation

## 2018-10-15 DIAGNOSIS — Z794 Long term (current) use of insulin: Secondary | ICD-10-CM | POA: Diagnosis not present

## 2018-10-15 DIAGNOSIS — E109 Type 1 diabetes mellitus without complications: Secondary | ICD-10-CM | POA: Insufficient documentation

## 2018-10-15 DIAGNOSIS — R69 Illness, unspecified: Secondary | ICD-10-CM

## 2018-10-15 DIAGNOSIS — Z79899 Other long term (current) drug therapy: Secondary | ICD-10-CM | POA: Diagnosis not present

## 2018-10-15 DIAGNOSIS — R0902 Hypoxemia: Secondary | ICD-10-CM | POA: Diagnosis not present

## 2018-10-15 DIAGNOSIS — J101 Influenza due to other identified influenza virus with other respiratory manifestations: Secondary | ICD-10-CM | POA: Diagnosis not present

## 2018-10-15 DIAGNOSIS — M791 Myalgia, unspecified site: Secondary | ICD-10-CM | POA: Diagnosis present

## 2018-10-15 DIAGNOSIS — R079 Chest pain, unspecified: Secondary | ICD-10-CM | POA: Diagnosis not present

## 2018-10-15 DIAGNOSIS — R531 Weakness: Secondary | ICD-10-CM | POA: Diagnosis not present

## 2018-10-15 DIAGNOSIS — J111 Influenza due to unidentified influenza virus with other respiratory manifestations: Secondary | ICD-10-CM | POA: Diagnosis not present

## 2018-10-15 DIAGNOSIS — I4510 Unspecified right bundle-branch block: Secondary | ICD-10-CM | POA: Diagnosis not present

## 2018-10-15 DIAGNOSIS — R0789 Other chest pain: Secondary | ICD-10-CM | POA: Diagnosis not present

## 2018-10-15 LAB — CBC WITH DIFFERENTIAL/PLATELET
Abs Immature Granulocytes: 0.01 10*3/uL (ref 0.00–0.07)
BASOS ABS: 0 10*3/uL (ref 0.0–0.1)
BASOS PCT: 1 %
EOS ABS: 0.1 10*3/uL (ref 0.0–0.5)
EOS PCT: 1 %
HEMATOCRIT: 54.2 % — AB (ref 39.0–52.0)
Hemoglobin: 18.3 g/dL — ABNORMAL HIGH (ref 13.0–17.0)
Immature Granulocytes: 0 %
LYMPHS ABS: 2.2 10*3/uL (ref 0.7–4.0)
Lymphocytes Relative: 33 %
MCH: 27.4 pg (ref 26.0–34.0)
MCHC: 33.8 g/dL (ref 30.0–36.0)
MCV: 81 fL (ref 80.0–100.0)
Monocytes Absolute: 0.6 10*3/uL (ref 0.1–1.0)
Monocytes Relative: 9 %
NRBC: 0 % (ref 0.0–0.2)
Neutro Abs: 3.7 10*3/uL (ref 1.7–7.7)
Neutrophils Relative %: 56 %
PLATELETS: 270 10*3/uL (ref 150–400)
RBC: 6.69 MIL/uL — AB (ref 4.22–5.81)
RDW: 13.8 % (ref 11.5–15.5)
WBC: 6.6 10*3/uL (ref 4.0–10.5)

## 2018-10-15 LAB — COMPREHENSIVE METABOLIC PANEL
ALT: 21 U/L (ref 0–44)
ANION GAP: 11 (ref 5–15)
AST: 23 U/L (ref 15–41)
Albumin: 3.5 g/dL (ref 3.5–5.0)
Alkaline Phosphatase: 97 U/L (ref 38–126)
BILIRUBIN TOTAL: 1.2 mg/dL (ref 0.3–1.2)
BUN: 13 mg/dL (ref 6–20)
CHLORIDE: 108 mmol/L (ref 98–111)
CO2: 20 mmol/L — ABNORMAL LOW (ref 22–32)
Calcium: 8.9 mg/dL (ref 8.9–10.3)
Creatinine, Ser: 1.12 mg/dL (ref 0.61–1.24)
GFR calc Af Amer: >60 mL/min (ref >60)
GFR calc non Af Amer: >60 mL/min (ref >60)
GLUCOSE: 104 mg/dL — AB (ref 70–99)
POTASSIUM: 3.3 mmol/L — AB (ref 3.5–5.1)
Sodium: 139 mmol/L (ref 135–145)
TOTAL PROTEIN: 6.8 g/dL (ref 6.5–8.1)

## 2018-10-15 LAB — PROTIME-INR
INR: 1.12
PROTHROMBIN TIME: 14.3 s (ref 11.4–15.2)

## 2018-10-15 LAB — INFLUENZA PANEL BY PCR (TYPE A & B)
INFLAPCR: NEGATIVE
INFLBPCR: NEGATIVE

## 2018-10-15 LAB — CBG MONITORING, ED: GLUCOSE-CAPILLARY: 108 mg/dL — AB (ref 70–99)

## 2018-10-15 LAB — I-STAT TROPONIN, ED: TROPONIN I, POC: 0 ng/mL (ref 0.00–0.08)

## 2018-10-15 MED ORDER — SODIUM CHLORIDE 0.9 % IV BOLUS
1000.0000 mL | Freq: Once | INTRAVENOUS | Status: AC
  Administered 2018-10-15: 1000 mL via INTRAVENOUS

## 2018-10-15 NOTE — ED Provider Notes (Signed)
[facility] Provider Note   CSN: [id number] Arrival date & time: 10/15/18  1826     History   Chief Complaint Chief Complaint  Patient presents with  . Chest Pain  . Generalized Body Aches    HPI Kiran Maisie [name] is a 22 y.o. male.  HPI  Patient presents with concern of flulike illness. Illness began about 3 days ago, since that time patient has had generalized discomfort, nausea, chest pain, cough. No relief with OTC medication. Chest pain is not clearly exertional, nor pleuritic. Syncope Patient is unsure about actual fever Patient has multiple medical issues including Wolff-Parkinson-White, Ebstein's abnormality, type 1 diabetes.   Past Medical History:  Diagnosis Date  . Congenital heart disease   . Diabetes mellitus without complication (HCC)   . Seizures [facility](HCC)     Patient Active Problem List   Diagnosis Date Noted  . Long term (current) use of anticoagulants [Z79.01] 01/12/2018  . Septic embolism (HCC)   . Libman Sacks endocarditis (HCC)   . Leucocytosis   . [place] endocarditis   . 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 Uncontrolled type 1 diabetes mellitus with hyperglycemia (HCC) 01/03/2018  . Seizure (HCC) 01/03/2018  . Stroke (cerebrum) (HCC) 01/03/2018  . Stroke (HCC) 01/03/2018  . Ebstein's anomaly     Past Surgical History:  Procedure Laterality Date  . open heart surgery    . TEE WITHOUT CARDIOVERSION N/A 01/06/2018   Procedure: TRANSESOPHAGEAL ECHOCARDIOGRAM (TEE);  Surgeon: Wendall StadeNishan, Peter C, MD;  [facility];  Service: Cardiovascular;  Laterality: N/A;        Home Medications    Prior to Admission medications   Medication Sig Start Date End Date Taking? Authorizing Provider  doxycycline (VIBRA-TABS) 100 MG tablet Take 100 mg by mouth 2 (two) times daily.  10/12/18  Yes [provider]  ibuprofen (ADVIL,MOTRIN) 600 MG  tablet Take 1 tablet (600 mg total) by mouth every 6 (six) hours as needed. Patient taking differently: Take 600 mg by mouth every 6 (six) hours as needed for headache or mild pain.  08/28/18  Yes Dartha LodgeFord, Kelsey N, PA-C  insulin glargine (LANTUS) 100 UNIT/ML injection Inject 30 Units into the skin at bedtime.   Yes [provider]  insulin lispro (HUMALOG) 100 UNIT/ML injection Inject 10-30 Units into the skin See admin instructions. Inject 10-30 units into the skin three times a day before meals, per sliding scale   Yes [provider]  Insulin Syringe-Needle U-100 (INSULIN SYRINGE .3CC/31GX5/16") 31G X 5/16" 0.3 ML MISC 1 Act by Does not apply route 4 (four) times daily -  before meals and at bedtime. 01/09/18   Rolly SalterPatel, Pranav M, MD  levETIRAcetam (KEPPRA) 500 MG tablet Take 1 tablet (500 mg total) by mouth 2 (two) times daily. Patient not taking: Reported on 10/15/2018 01/08/18   Rolly SalterPatel, Pranav M, MD  [facility] HFA 108 [phone](90 Base) MCG/ACT inhaler  08/04/18   [provider]  warfarin (COUMADIN) 10 MG tablet Take 1 tablet daily Or As Directed by Coumadin Clinic Patient taking differently: Take 5-10 mg by mouth See admin instructions. Take 5 mg by mouth once a day on Sun/Mon/Wed/Fri/Sat and 10 mg on Tues/Thurs 05/19/18   Nahser, Deloris PingPhilip J, MD    Family History Family History  Problem Relation Age of Onset  . Diabetes Mellitus I Sister     Social History Social History   Tobacco Use  .  Smoking status: Never Smoker  . Smokeless tobacco: Never Used  Substance Use Topics  . Alcohol use: Yes    Frequency: Never    Comment: social  . Drug use: Yes    Types: Marijuana     Allergies   Patient has no known allergies.   Review of Systems Review of Systems  Constitutional:       Per HPI, otherwise negative  HENT:       Per HPI, otherwise negative  Respiratory:       Per HPI, otherwise negative  Cardiovascular:       Per HPI, otherwise negative  Gastrointestinal:  Positive for nausea. Negative for vomiting.  Endocrine:       Negative aside from HPI  Genitourinary:       Neg aside from HPI   Musculoskeletal:       Per HPI, otherwise negative  Skin: Negative.   Neurological: Positive for weakness. Negative for syncope.     Physical Exam Updated Vital Signs BP 130/73   Pulse (!) 49   Temp 99.2 F (37.3 C)   Resp 14   Ht 5\' 6"  (1.676 m)   Wt 54.4 kg   SpO2 94%   BMI 19.37 kg/m   Physical Exam  Constitutional: He is oriented to person, place, and time. He appears well-developed. No distress.  HENT:  Head: Normocephalic and atraumatic.  Eyes: Conjunctivae and EOM are normal.  Cardiovascular: Regular rhythm. Tachycardia present.  Pulmonary/Chest: Effort normal. No stridor. Tachypnea noted. No respiratory distress.  Abdominal: He exhibits no distension.    Musculoskeletal: He exhibits no edema.  Neurological: He is alert and oriented to person, place, and time.  Skin: Skin is warm and dry.  Psychiatric: He has a normal mood and affect.  Nursing note and vitals reviewed.    ED Treatments / Results  Labs (all labs ordered are listed, but only abnormal results are displayed) Labs Reviewed  CBC WITH DIFFERENTIAL/PLATELET - Abnormal; Notable for the following components:      Result Value   RBC 6.69 (*)    Hemoglobin 18.3 (*)    HCT 54.2 (*)    All other components within normal limits  COMPREHENSIVE METABOLIC PANEL - Abnormal; Notable for the following components:   Potassium 3.3 (*)    CO2 20 (*)    Glucose, Bld 104 (*)    All other components within normal limits  CBG MONITORING, ED - Abnormal; Notable for the following components:   Glucose-Capillary 108 (*)    All other components within normal limits  INFLUENZA PANEL BY PCR (TYPE A & B)  PROTIME-INR  CBG MONITORING, ED  I-STAT TROPONIN, ED    EKG EKG Interpretation  Date/Time:  Thursday October 15 2018 18:28:05 EST Ventricular Rate:  108 PR Interval:  180 QRS  Duration: 176 QT Interval:  414 QTC Calculation: 554 R Axis:   133 Text Interpretation: Sinus tach, PAC, conduction block, artefact, abnormal   Radiology Dg Chest 2 View  Result Date: 10/15/2018 CLINICAL DATA:  Generalized pain and weakness. Chest pain. History of congenital heart disease and open-heart surgery. EXAM: CHEST - 2 VIEW COMPARISON:  08/28/2018 CXR, thoracic spine MRI 08/28/2018 FINDINGS: Chronic stable T4 compression deformity no new fracture or suspicious osseous lesions. Borderline cardiomegaly with median sternotomy sutures in place. Lungs are clear. IMPRESSION: No active disease. Chronic stable T4 compression deformity. Borderline cardiomegaly. Electronically Signed   By: Tollie Eth M.D.   On: 10/15/2018 22:00  Procedures Procedures (including critical care time)  Medications Ordered in ED Medications  sodium chloride 0.9 % bolus 1,000 mL (1,000 mLs Intravenous New Bag/Given 10/15/18 2037)     Initial Impression / Assessment and Plan / ED Course  I have reviewed the triage vital signs and the nursing notes.  Pertinent labs & imaging results that were available during my care of the patient were reviewed by me and considered in my medical decision making (see chart for details).     11:10 PM Patient appears calm, smiling. Heart rate has diminished, he has no ongoing complaints. We reviewed all findings, no evidence for pneumonia, no evidence of bacteremia, sepsis purulent patient has no ongoing chest pain, low suspicion for tacky arrhythmia, no evidence for acute other pathology.  On some suspicion for viral illness given the patient's generalized concerns. Patient has a physician with whom he can follow-up, was discharged in stable condition.  Final Clinical Impressions(s) / ED Diagnoses   Final diagnoses:  Influenza-like illness     Gerhard Munch, MD 10/15/18 2314

## 2018-10-15 NOTE — ED Triage Notes (Signed)
Pt BIB [facility] for eval of generalized pain/weakness, chest pain, nausea onset this AM. Pt w/ significant cardiac hx of open heart surgery and congenital heart disease. Pt also Type I diabetic, states unable to eat/drink today d/t feeling so poorly. 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 Pt placed on cardiac monitor.

## 2018-10-15 NOTE — Discharge Instructions (Signed)
As discussed, your evaluation today has been largely reassuring.  But, it is important that you monitor your condition carefully, and do not hesitate to return to the ED if you develop new, or concerning changes in your condition. ? ?Otherwise, please follow-up with your physician for appropriate ongoing care. ? ?

## 2018-10-16 DIAGNOSIS — J111 Influenza due to unidentified influenza virus with other respiratory manifestations: Secondary | ICD-10-CM | POA: Diagnosis not present

## 2018-10-20 ENCOUNTER — Encounter: Payer: Self-pay | Admitting: Cardiology

## 2018-10-20 ENCOUNTER — Ambulatory Visit: Payer: BLUE CROSS/BLUE SHIELD | Admitting: Cardiology

## 2018-10-20 VITALS — BP 110/60 | HR 55 | Ht 66.0 in | Wt 115.1 lb

## 2018-10-20 DIAGNOSIS — Q211 Atrial septal defect, unspecified: Secondary | ICD-10-CM

## 2018-10-20 DIAGNOSIS — R0789 Other chest pain: Secondary | ICD-10-CM | POA: Diagnosis not present

## 2018-10-20 DIAGNOSIS — Q225 Ebstein's anomaly: Secondary | ICD-10-CM | POA: Diagnosis not present

## 2018-10-20 NOTE — Progress Notes (Signed)
10/20/2018 Manuel Baker   01-05-1996  [id number]  Primary Physician Patient, No Pcp Per Primary Cardiologist: Dr. Elease Hashimoto   Reason for Visit/CC: Post ED f/u for CP   HPI:  Manuel Baker is a 22 y.o. 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 H/o CVA, T1DM and h/o seizures. 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   He was admitted to the hospital in Feb. 2019  with seizures and a CVA. MRI showed multiple bilateral infarcts.Transesophageal echo revealed severe right ventricular failure with wide-open tricuspid regurgitation.  There was a vegetation on the tricuspid valve.  He was found to have a large ASD with significant right to left shunting across the ASD. 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 Neurology recommended anticoagulation. He is on coumadin.   He was to go to the congenital heart clinic at [facility] to discuss undergoing another surgery. Pt never made to appt due to reported transportation issues.    He is here today for post ED f/u. He went to the ED on 10/15/18 with upper respiratory and flu like symptoms. Had chills, subjective fever, body aches, chest soreness and cough. His college roommate had similar symptoms. In the ED, he ruled out for influenza. CXR showed no PNA. Troponin negative. CBC and BMP unremarkable other than mild hypokalemia, at 3.3. His EKG showed sinus tach which improved w/ IVFs. His symptoms were felt likely due to a viral syndrome. He was released from the ED and instructed to f/u in our office.   He is back in clinic today. He denies any recurrent CP. No dyspnea. Denies palpitation, dizziness, syncope/ near syncope. No stroke like symptoms. He admits that he has not been fully compliant w/ Coumadin. He recently got a tattoo and didn't want to bleed so he stopped it. He plans to resume soon and f/u in the coumadin clinic.    Current Meds  Medication  Sig  . doxycycline (VIBRA-TABS) 100 MG tablet Take 100 mg by mouth 2 (two) times daily.   Marland Kitchen ibuprofen (ADVIL,MOTRIN) 600 MG tablet Take 1 tablet (600 mg total) by mouth every 6 (six) hours as needed.  . insulin glargine (LANTUS) 100 UNIT/ML injection Inject 30 Units into the skin at bedtime.  . insulin lispro (HUMALOG) 100 UNIT/ML injection Inject 10-30 Units into the skin See admin instructions. Inject 10-30 units into the skin three times a day before meals, per sliding scale  . Insulin Syringe-Needle U-100 (INSULIN SYRINGE .3CC/31GX5/16") 31G X 5/16" 0.3 ML MISC 1 Act by Does not apply route 4 (four) times daily -  before meals and at bedtime.  Marland Kitchen PROAIR HFA 108 (90 Base) MCG/ACT inhaler Inhale 1 puff into the lungs as needed.   . warfarin (COUMADIN) 10 MG tablet Take 1 tablet daily Or As Directed by Coumadin Clinic  . [DISCONTINUED] levETIRAcetam (KEPPRA) 500 MG tablet Take 1 tablet (500 mg total) by mouth 2 (two) times daily.   No Known Allergies Past Medical History:  Diagnosis Date  . Congenital heart disease   . Diabetes mellitus without complication (HCC)   . Seizures (HCC)    Family History  Problem Relation Age of Onset  . Diabetes Mellitus I Sister    Past Surgical History:  Procedure Laterality Date  . open heart surgery    . TEE WITHOUT CARDIOVERSION N/A 01/06/2018   Procedure: TRANSESOPHAGEAL ECHOCARDIOGRAM (TEE);  Surgeon: Wendall StadeNishan, Peter C, MD;  [facility];  Service: Cardiovascular;  Laterality: N/A;   Social History   Socioeconomic History  . Marital status: Single    Spouse name: Not on file  . Number of children: Not on file  . Years of education: Not on file  . Highest education level: Not on file  Occupational History  . Not on file  Social Needs  . Financial resource strain: Not on file  . Food insecurity:    Worry: Not on file    Inability: Not on file  . Transportation needs:    Medical: Not on file    Non-medical: Not on file  Tobacco Use   . Smoking status: Never Smoker  . Smokeless tobacco: Never Used  Substance and Sexual Activity  . Alcohol use: Yes    Frequency: Never    Comment: social  . Drug use: Yes    Types: Marijuana  . Sexual activity: Not on file  Lifestyle  . Physical activity:    Days per week: Not on file    Minutes per session: Not on file  . Stress: Not on file  Relationships  . Social connections:    Talks on phone: Not on file    Gets together: Not on file    Attends religious service: Not on file    Active member of club or organization: Not on file    Attends meetings of clubs or organizations: Not on file    Relationship status: Not on file  . Intimate partner violence:    Fear of current or ex partner: Not on file    Emotionally abused: Not on file    Physically abused: Not on file    Forced sexual activity: Not on file  Other Topics Concern  . Not on file  Social History Narrative  . Not on file     Review of Systems: General: negative for chills, fever, night sweats or weight changes.  Cardiovascular: negative for chest pain, dyspnea on exertion, edema, orthopnea, palpitations, paroxysmal nocturnal dyspnea or shortness of breath Dermatological: negative for rash Respiratory: negative for cough or wheezing Urologic: negative for hematuria Abdominal: negative for nausea, vomiting, diarrhea, bright red blood per rectum, melena, or hematemesis Neurologic: negative for visual changes, syncope, or dizziness All other systems reviewed and are otherwise negative except as noted above.   Physical Exam:  Blood pressure 110/60, pulse (!) 55, height 5\' 6"  (1.676 m), weight 115 lb 1.9 oz (52.2 kg), SpO2 90 %.  General appearance: alert, cooperative, no distress and thin young adult male Neck: no carotid bruit and no JVD Lungs: clear to auscultation bilaterally Heart: regular rate and rhythm and murmur noted Extremities: extremities normal, atraumatic, no cyanosis or edema Pulses: 2+ and  symmetric Skin: Skin color, texture, turgor normal. No rashes or lesions Neurologic: Grossly normal  EKG not performed -- personally reviewed   ASSESSMENT AND PLAN:   1. Chest Pain: recent occurrence in the setting of cough, URI, body aches. Pt said his chest was sore, felt like he had lifted weights. He ruled out for flu. ED troponin negative. CP resolved. No recurrence. Suspect musculoskeletal CP wall pain from cough and viral syndrome.   2. 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 Transesophageal echo done earlier this year for stroke revealed severe right ventricular failure with wide-open tricuspid regurgitation.  There was a vegetation on the tricuspid valve.  He was found to have a large  ASD with significant right to left shunting across the ASD. It was recommended he follow-up with the [facility] congenital heart clinic. Pt failed to make appt. We will try to see about getting another appt, as he had transportation issues at that time.   3. H/o CVA: outlined above. Poor compliance with coumadin. He recently got a tattoo and didn't want to bleed so he stopped it. He plans to resume soon and f/u in the coumadin clinic.   4. T1DM: he reports compliance with insulin. Management per PCP.    Follow-Up Dr. Elease Hashimoto in 3 months.   Sheldon Amara Delmer Islam, MHS [facility] 10/20/2018 12:35 PM

## 2018-10-20 NOTE — Addendum Note (Signed)
Addended by: Levi AlandSWINYER,  M on: 10/20/2018 02:11 PM   Modules accepted: Orders

## 2018-10-20 NOTE — Patient Instructions (Signed)
Medication Instructions:  none If you need a refill on your cardiac medications before your next appointment, please call your pharmacy.   Lab work: none If you have labs (blood work) drawn today and your tests are completely normal, you will receive your results only by: Marland Kitchen. MyChart Message (if you have MyChart) OR . A paper copy in the mail If you have any lab test that is abnormal or we need to change your treatment, we will call you to review the results.  Testing/Procedures: none  Follow-Up:  Needs an appointment in Coumadin Clinic 1-2 weeks  At [facility], you and your health needs are our priority.  As part of our continuing mission to provide you with exceptional heart care, we have created designated Provider Care Teams.  These Care Teams include your primary Cardiologist (physician) and Advanced Practice Providers (APPs -  Physician Assistants and Nurse Practitioners) who all work together to provide you with the care you need, when you need it. You will need a follow up appointment in:  3 months.  Please call our office 2 months in advance to schedule this appointment.  You may see Kristeen MissPhilip Nahser, MD or one of the following Advanced Practice Providers on your designated Care Team: Tereso NewcomerScott Weaver, PA-C Vin HomeworthBhagat, [place] JerseyPA-C . Berton BonJanine Hammond, NP  Any Other Special Instructions Will Be Listed Below (If Applicable).

## 2018-10-28 ENCOUNTER — Ambulatory Visit (INDEPENDENT_AMBULATORY_CARE_PROVIDER_SITE_OTHER): Payer: BLUE CROSS/BLUE SHIELD | Admitting: *Deleted

## 2018-10-28 DIAGNOSIS — I513 Intracardiac thrombosis, not elsewhere classified: Secondary | ICD-10-CM | POA: Diagnosis not present

## 2018-10-28 DIAGNOSIS — I639 Cerebral infarction, unspecified: Secondary | ICD-10-CM | POA: Diagnosis not present

## 2018-10-28 DIAGNOSIS — Z7901 Long term (current) use of anticoagulants: Secondary | ICD-10-CM

## 2018-10-28 DIAGNOSIS — I76 Septic arterial embolism: Secondary | ICD-10-CM

## 2018-10-28 DIAGNOSIS — I240 Acute coronary thrombosis not resulting in myocardial infarction: Secondary | ICD-10-CM

## 2018-10-28 LAB — POCT INR: INR: 1.1 — AB (ref 2.0–3.0)

## 2018-10-28 MED ORDER — WARFARIN SODIUM 10 MG PO TABS: ORAL_TABLET | ORAL | 0 refills | Status: DC

## 2018-10-28 NOTE — Patient Instructions (Signed)
Description   Thursday  take 1.5 tablets, Friday take 1 tablet, then Continue taking 1 tablet daily except 1/2 tablet on Mondays, Wednesdays, and Fridays.  Recheck in 1 week. Call us with any questions or concerns to [facility] @ [phone].

## 2019-01-13 ENCOUNTER — Encounter (HOSPITAL_COMMUNITY): Payer: Self-pay

## 2019-01-13 ENCOUNTER — Emergency Department (HOSPITAL_COMMUNITY)
Admission: EM | Admit: 2019-01-13 | Discharge: 2019-01-14 | Disposition: A | Discharge status: Home / Self Care | Payer: BLUE CROSS/BLUE SHIELD | Attending: Emergency Medicine | Admitting: Emergency Medicine

## 2019-01-13 DIAGNOSIS — R110 Nausea: Secondary | ICD-10-CM | POA: Insufficient documentation

## 2019-01-13 DIAGNOSIS — E109 Type 1 diabetes mellitus without complications: Secondary | ICD-10-CM | POA: Diagnosis not present

## 2019-01-13 DIAGNOSIS — M545 Low back pain: Secondary | ICD-10-CM | POA: Insufficient documentation

## 2019-01-13 DIAGNOSIS — R05 Cough: Secondary | ICD-10-CM | POA: Diagnosis not present

## 2019-01-13 DIAGNOSIS — R509 Fever, unspecified: Secondary | ICD-10-CM | POA: Diagnosis not present

## 2019-01-13 DIAGNOSIS — R079 Chest pain, unspecified: Secondary | ICD-10-CM | POA: Diagnosis not present

## 2019-01-13 DIAGNOSIS — R51 Headache: Secondary | ICD-10-CM | POA: Insufficient documentation

## 2019-01-13 DIAGNOSIS — F1290 Cannabis use, unspecified, uncomplicated: Secondary | ICD-10-CM | POA: Insufficient documentation

## 2019-01-13 DIAGNOSIS — J101 Influenza due to other identified influenza virus with other respiratory manifestations: Secondary | ICD-10-CM

## 2019-01-13 DIAGNOSIS — R0789 Other chest pain: Secondary | ICD-10-CM | POA: Diagnosis not present

## 2019-01-13 NOTE — ED Triage Notes (Addendum)
Pt reports yesterday pt woke up yesterday morning and his back hurt and his tongue was sore. Pt states hx of seizures and believes he had one. Pt also reports generalized body aches, productive cough, and sore throat starting yesterday. Pt is alert, oriented, and ambulatory. Pt took 500 mg of tylenol around 1700.

## 2019-01-14 ENCOUNTER — Emergency Department (HOSPITAL_COMMUNITY): Payer: BLUE CROSS/BLUE SHIELD

## 2019-01-14 DIAGNOSIS — R079 Chest pain, unspecified: Secondary | ICD-10-CM | POA: Diagnosis not present

## 2019-01-14 LAB — URINALYSIS, ROUTINE W REFLEX MICROSCOPIC
BACTERIA UA: NONE SEEN
Bilirubin Urine: NEGATIVE
Glucose, UA: >=500 mg/dL — AB
Ketones, ur: 20 mg/dL — AB
Leukocytes,Ua: NEGATIVE
Nitrite: NEGATIVE
PH: 6 (ref 5.0–8.0)
Protein, ur: NEGATIVE mg/dL
Specific Gravity, Urine: 1.023 (ref 1.005–1.030)

## 2019-01-14 LAB — CBC WITH DIFFERENTIAL/PLATELET
ABS IMMATURE GRANULOCYTES: 0.02 10*3/uL (ref 0.00–0.07)
Basophils Absolute: 0.1 10*3/uL (ref 0.0–0.1)
Basophils Relative: 1 %
Eosinophils Absolute: 0 10*3/uL (ref 0.0–0.5)
Eosinophils Relative: 0 %
HEMATOCRIT: 48.3 % (ref 39.0–52.0)
HEMOGLOBIN: 16.3 g/dL (ref 13.0–17.0)
Immature Granulocytes: 0 %
LYMPHS ABS: 0.8 10*3/uL (ref 0.7–4.0)
LYMPHS PCT: 17 %
MCH: 27.4 pg (ref 26.0–34.0)
MCHC: 33.7 g/dL (ref 30.0–36.0)
MCV: 81.3 fL (ref 80.0–100.0)
MONO ABS: 0.6 10*3/uL (ref 0.1–1.0)
MONOS PCT: 11 %
NRBC: 0 % (ref 0.0–0.2)
Neutro Abs: 3.6 10*3/uL (ref 1.7–7.7)
Neutrophils Relative %: 71 %
Platelets: 214 10*3/uL (ref 150–400)
RBC: 5.94 MIL/uL — AB (ref 4.22–5.81)
RDW: 13.5 % (ref 11.5–15.5)
WBC: 5.1 10*3/uL (ref 4.0–10.5)

## 2019-01-14 LAB — PROTIME-INR
INR: 1.31
Prothrombin Time: 16.2 seconds — ABNORMAL HIGH (ref 11.4–15.2)

## 2019-01-14 LAB — COMPREHENSIVE METABOLIC PANEL
ALK PHOS: 68 U/L (ref 38–126)
ALT: 25 U/L (ref 0–44)
AST: 33 U/L (ref 15–41)
Albumin: 3.4 g/dL — ABNORMAL LOW (ref 3.5–5.0)
Anion gap: 14 (ref 5–15)
BUN: 13 mg/dL (ref 6–20)
CALCIUM: 8.9 mg/dL (ref 8.9–10.3)
CO2: 19 mmol/L — AB (ref 22–32)
CREATININE: 1.49 mg/dL — AB (ref 0.61–1.24)
Chloride: 101 mmol/L (ref 98–111)
GFR calc non Af Amer: >60 mL/min (ref >60)
Glucose, Bld: 374 mg/dL — ABNORMAL HIGH (ref 70–99)
Potassium: 4.1 mmol/L (ref 3.5–5.1)
SODIUM: 134 mmol/L — AB (ref 135–145)
Total Bilirubin: 2 mg/dL — ABNORMAL HIGH (ref 0.3–1.2)
Total Protein: 6.1 g/dL — ABNORMAL LOW (ref 6.5–8.1)

## 2019-01-14 LAB — LACTIC ACID, PLASMA
LACTIC ACID, VENOUS: 1.4 mmol/L (ref 0.5–1.9)
Lactic Acid, Venous: 1.5 mmol/L (ref 0.5–1.9)

## 2019-01-14 LAB — INFLUENZA PANEL BY PCR (TYPE A & B)
INFLBPCR: NEGATIVE
Influenza A By PCR: POSITIVE — AB

## 2019-01-14 MED ORDER — ONDANSETRON 4 MG PO TBDP: 4.0000 mg | ORAL_TABLET | Freq: Three times a day (TID) | ORAL | 0 refills | Status: DC | PRN

## 2019-01-14 MED ORDER — ONDANSETRON HCL 4 MG/2ML IJ SOLN
4.0000 mg | Freq: Once | INTRAMUSCULAR | Status: AC
  Administered 2019-01-14: 4 mg via INTRAVENOUS
  Filled 2019-01-14: qty 2

## 2019-01-14 MED ORDER — OSELTAMIVIR PHOSPHATE 75 MG PO CAPS: 75.0000 mg | ORAL_CAPSULE | Freq: Two times a day (BID) | ORAL | 0 refills | Status: DC

## 2019-01-14 MED ORDER — PROMETHAZINE-DM 6.25-15 MG/5ML PO SYRP: 5.0000 mL | ORAL_SOLUTION | Freq: Four times a day (QID) | ORAL | 0 refills | Status: DC | PRN

## 2019-01-14 MED ORDER — OSELTAMIVIR PHOSPHATE 75 MG PO CAPS
75.0000 mg | ORAL_CAPSULE | Freq: Once | ORAL | Status: AC
  Administered 2019-01-14: 75 mg via ORAL
  Filled 2019-01-14: qty 1

## 2019-01-14 MED ORDER — SODIUM CHLORIDE 0.9 % IV BOLUS
500.0000 mL | Freq: Once | INTRAVENOUS | Status: AC
  Administered 2019-01-14: 500 mL via INTRAVENOUS

## 2019-01-14 MED ORDER — ACETAMINOPHEN 500 MG PO TABS: 1000.0000 mg | ORAL_TABLET | Freq: Three times a day (TID) | ORAL | 0 refills | Status: DC | PRN

## 2019-01-14 MED ORDER — INSULIN GLARGINE 100 UNIT/ML ~~LOC~~ SOLN
30.0000 [IU] | Freq: Once | SUBCUTANEOUS | Status: AC
  Administered 2019-01-14: 30 [IU] via SUBCUTANEOUS
  Filled 2019-01-14: qty 0.3

## 2019-01-14 MED ORDER — ACETAMINOPHEN 500 MG PO TABS
1000.0000 mg | ORAL_TABLET | Freq: Once | ORAL | Status: AC
  Administered 2019-01-14: 1000 mg via ORAL
  Filled 2019-01-14: qty 2

## 2019-01-14 NOTE — ED Provider Notes (Addendum)
[facility] EMERGENCY DEPARTMENT Provider Note   CSN: [id number] Arrival date & time: 01/13/19  2050    History   Chief Complaint Chief Complaint  Patient presents with  . Cough  . Seizure-like Activity    HPI Manuel Baker is a 23 y.o. 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 H/o CVA, T1DM, endocarditis, and h/o seizures to the emergency department with a chief complaint of " I think I had a seizure."  The patient reports he awoke yesterday morning with sudden onset pain in his mid to lower back and concerns that he bit his tongue.  He reports a history of previous seizures and he is concerned that he may have had a seizure in his sleep.  He reports he was unable to get out of bed the entire day because of the pain and feeling fatigued. He later pain in his bilateral chest, nonproductive cough, body aches, headache, and nausea over the last 24 hours.  Patient was noted to be febrile and tachycardic upon arrival to the ER.  He reports that he was finally able to get out of bed and attempted to go to work.  He took 500 mg of Tylenol at 1700.  He has not taken his nighttime dose of Lantus.   He reports his roommate has been ill with a cough, but he is unsure if he has been febrile or diagnosed with the flu.  He did not receive an influenza vaccination this year.  He denies vomiting, diarrhea, abdominal pain, shortness of breath, numbness, weakness, visual changes, slurred speech, palpitations, dizziness, urinary symptoms, or lightheadedness.  The patient reports that he was previously taking Coumadin after having a stroke in 2019, but states that he was cleared to stop the medication several months ago.     The history is provided by the patient. No language interpreter was used.    Past Medical History:  Diagnosis Date  . Congenital heart disease   . Diabetes mellitus without complication (HCC)   . Seizures [facility])     Patient  Active Problem List   Diagnosis Date Noted  . Long term (current) use of anticoagulants [Z79.01] 01/12/2018  . Septic embolism (HCC)   . Libman Sacks endocarditis (HCC)   . Leucocytosis   . [place] endocarditis   . 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 Uncontrolled type 1 diabetes mellitus with hyperglycemia (HCC) 01/03/2018  . Seizure (HCC) 01/03/2018  . Stroke (cerebrum) (HCC) 01/03/2018  . Stroke (HCC) 01/03/2018  . Ebstein's anomaly     Past Surgical History:  Procedure Laterality Date  . open heart surgery    . TEE WITHOUT CARDIOVERSION N/A 01/06/2018   Procedure: TRANSESOPHAGEAL ECHOCARDIOGRAM (TEE);  Surgeon: Wendall Stade, MD;  Location: [facility] ENDOSCOPY;  Service: Cardiovascular;  Laterality: N/A;        Home Medications    Prior to Admission medications   Medication Sig Start Date End Date Taking? Authorizing Provider  insulin glargine (LANTUS) 100 UNIT/ML injection Inject 30 Units into the skin at bedtime.   Yes [provider]  insulin lispro (HUMALOG) 100 UNIT/ML injection Inject 10-30 Units into the skin See admin instructions. Inject 10-30 units into the skin three times a day before meals, per sliding scale   Yes [provider]  acetaminophen (TYLENOL) 500 MG tablet Take 2 tablets (1,000 mg total) by mouth every 8 (  eight) hours as needed for fever. 01/14/19   ,  A, PA-C  Insulin Syringe-Needle U-100 (INSULIN SYRINGE .3CC/31GX5/16") 31G X 5/16" 0.3 ML MISC 1 Act by Does not apply route 4 (four) times daily -  before meals and at bedtime. 01/09/18   Rolly Salter, MD  ondansetron (ZOFRAN ODT) 4 MG disintegrating tablet Take 1 tablet (4 mg total) by mouth every 8 (eight) hours as needed for nausea or vomiting. 01/14/19   ,  A, PA-C  oseltamivir (TAMIFLU) 75 MG capsule Take 1 capsule (75 mg total) by mouth every 12 (twelve) hours. 01/14/19   ,  A, PA-C    promethazine-dextromethorphan (PROMETHAZINE-DM) 6.25-15 MG/5ML syrup Take 5 mLs by mouth 4 (four) times daily as needed for cough. 01/14/19   , Coral Else, PA-C    Family History Family History  Problem Relation Age of Onset  . Diabetes Mellitus I Sister     Social History Social History   Tobacco Use  . Smoking status: Never Smoker  . Smokeless tobacco: Never Used  Substance Use Topics  . Alcohol use: Yes    Frequency: Never    Comment: social  . Drug use: Yes    Types: Marijuana     Allergies   Patient has no known allergies.   Review of Systems Review of Systems  Constitutional: Positive for chills, fatigue and fever. Negative for appetite change.  HENT: Negative for congestion, ear pain, sinus pressure, sinus pain and sore throat.   Eyes: Negative for visual disturbance.  Respiratory: Positive for cough. Negative for shortness of breath and wheezing.   Cardiovascular: Positive for chest pain. Negative for palpitations and leg swelling.  Gastrointestinal: Positive for nausea. Negative for abdominal pain, constipation, diarrhea and vomiting.  Genitourinary: Negative for discharge, dysuria, frequency, penile pain and urgency.  Musculoskeletal: Positive for back pain and myalgias.  Skin: Negative for rash.  Allergic/Immunologic: Negative for immunocompromised state.  Neurological: Positive for headaches. Negative for dizziness, syncope, speech difficulty, weakness and light-headedness.  Psychiatric/Behavioral: Negative for confusion.     Physical Exam Updated Vital Signs BP (!) 112/58   Pulse 79   Temp 99.8 F (37.7 C) (Oral)   Resp 15   Ht 5\' 6"  (1.676 m)   Wt 54.4 kg   SpO2 96%   BMI 19.37 kg/m   Physical Exam Vitals signs and nursing note reviewed.  Constitutional:      Appearance: He is well-developed.  HENT:     Head: Normocephalic.     Right Ear: Hearing, tympanic membrane and ear canal normal.     Left Ear: Hearing, tympanic membrane and ear  canal normal.     Nose: Congestion present. No rhinorrhea.     Right Sinus: No maxillary sinus tenderness or frontal sinus tenderness.     Left Sinus: No maxillary sinus tenderness or frontal sinus tenderness.     Mouth/Throat:     Mouth: Mucous membranes are moist.     Pharynx: Posterior oropharyngeal erythema present. No pharyngeal swelling or oropharyngeal exudate.     Tonsils: No tonsillar exudate or tonsillar abscesses.  Eyes:     Extraocular Movements: Extraocular movements intact.     Conjunctiva/sclera: Conjunctivae normal.     Pupils: Pupils are equal, round, and reactive to light.  Neck:     Musculoskeletal: Normal range of motion and neck supple. No neck rigidity or muscular tenderness.     Vascular: No carotid bruit.  Cardiovascular:     Rate and Rhythm: Regular  rhythm. Tachycardia present.     Pulses: Normal pulses.     Heart sounds: Normal heart sounds. No murmur. No friction rub. No gallop.   Pulmonary:     Effort: Pulmonary effort is normal. No respiratory distress.     Breath sounds: No stridor. No wheezing, rhonchi or rales.  Chest:     Chest wall: No tenderness.  Abdominal:     General: There is no distension.     Palpations: Abdomen is soft. There is no mass.     Tenderness: There is abdominal tenderness. There is no right CVA tenderness, left CVA tenderness, guarding or rebound.     Hernia: No hernia is present.     Comments: Tender to palpation over the left CVA.  No right CVA tenderness.  Abdomen is otherwise soft, nondistended and without other focal tenderness.  Musculoskeletal:        General: Tenderness present.     Comments: Diffusely tender to palpation to the musculature of the mid to left low back.  No midline tenderness of the cervical, thoracic, or lumbar spine.  Lymphadenopathy:     Cervical: No cervical adenopathy.  Skin:    General: Skin is warm and dry.  Neurological:     Mental Status: He is alert.     Comments: Alert and oriented x3.  5-5  strength against resistance of the bilateral upper and lower extremities.  Cranial nerves II through XII are grossly intact.  Speaks in complete, fluent sentences.  Psychiatric:        Behavior: Behavior normal.      ED Treatments / Results  Labs (all labs ordered are listed, but only abnormal results are displayed) Labs Reviewed  CBC WITH DIFFERENTIAL/PLATELET - Abnormal; Notable for the following components:      Result Value   RBC 5.94 (*)    All other components within normal limits  COMPREHENSIVE METABOLIC PANEL - Abnormal; Notable for the following components:   Sodium 134 (*)    CO2 19 (*)    Glucose, Bld 374 (*)    Creatinine, Ser 1.49 (*)    Total Protein 6.1 (*)    Albumin 3.4 (*)    Total Bilirubin 2.0 (*)    All other components within normal limits  URINALYSIS, ROUTINE W REFLEX MICROSCOPIC - Abnormal; Notable for the following components:   Glucose, UA >=500 (*)    Hgb urine dipstick SMALL (*)    Ketones, ur 20 (*)    All other components within normal limits  INFLUENZA PANEL BY PCR (TYPE A & B) - Abnormal; Notable for the following components:   Influenza A By PCR POSITIVE (*)    All other components within normal limits  PROTIME-INR - Abnormal; Notable for the following components:   Prothrombin Time 16.2 (*)    All other components within normal limits  CULTURE, BLOOD (ROUTINE X 2)  CULTURE, BLOOD (ROUTINE X 2)  LACTIC ACID, PLASMA  LACTIC ACID, PLASMA    EKG EKG Interpretation  Date/Time:  Thursday January 14 2019 03:43:10 EST Ventricular Rate:  83 PR Interval:    QRS Duration: 181 QT Interval:  433 QTC Calculation: 509 R Axis:   176 Text Interpretation:  Sinus rhythm Consider right atrial enlargement RBBB and LPFB No significant change since last tracing Confirmed by Drema Pry [phone]) on 01/14/2019 3:48:30 AM   Radiology Dg Chest 2 View  Result Date: 01/14/2019 CLINICAL DATA:  Chest pain and fever EXAM: CHEST - 2 VIEW COMPARISON:  10/15/2018 FINDINGS: Post sternotomy changes. Fracture through the second sternal wire. Mild cardiomegaly. No acute airspace disease or effusion. No pneumothorax. Stable compression deformity of upper thoracic vertebra. IMPRESSION: No active cardiopulmonary disease.  Cardiomegaly Electronically Signed   By: Jasmine Pang M.D.   On: 01/14/2019 03:10    Procedures Procedures (including critical care time)  Medications Ordered in ED Medications  acetaminophen (TYLENOL) tablet 1,000 mg (1,000 mg Oral Given 01/14/19 0326)  sodium chloride 0.9 % bolus 500 mL (0 mLs Intravenous Stopped 01/14/19 0426)  ondansetron (ZOFRAN) injection 4 mg (4 mg Intravenous Given 01/14/19 0325)  oseltamivir (TAMIFLU) capsule 75 mg (75 mg Oral Given 01/14/19 0605)  insulin glargine (LANTUS) injection 30 Units (30 Units Subcutaneous Given 01/14/19 0606)     Initial Impression / Assessment and Plan / ED Course  I have reviewed the triage vital signs and the nursing notes.  Pertinent labs & imaging results that were available during my care of the patient were reviewed by me and considered in my medical decision making (see chart for details).        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 H/o CVA, T1DM, endocarditis, and h/o seizures presenting with concerns that he had a seizure in his sleep 2 nights ago.  He is also developed back pain, chest pain, and flulike symptoms over the last 24 hours.  However, given his history of endocarditis and CVA as well as Epstein's abnormally, will order labs, EKG, and chest x-ray.   Patient was febrile and tachycardic on arrival to the ER.  Tylenol given with resolution of fever and tachycardia.  No hypoxia.  Patient is normotensive.  Urinalysis with mild hemoglobinuria and mild ketonuria.  IV fluids given.  Glucose is 374 with bicarb of 19.  Anion gap is normal at 14.  Creatinine 1.49.  Total bilirubin is 2.0,  which appears chronically elevated.  He has no leukocytosis.  He is positive for influenza A.  Blood cultures x2 have been drawn.  Lactate is normal.  Chest x-ray with borderline cardiomegaly and chronic stable T4 compression deformity, but otherwise unremarkable for active cardiopulmonary disease.  EKG with no significant change from last tracing.  Patient was discussed and independently evaluated by Dr. Eudelia Bunch, attending physician.  Low suspicion for septicemia, endocarditis, pericarditis, myocarditis, pneumonia at this time.  First dose of Tamiflu given in the ER.  He was also given his nighttime dose of Lantus.  He reports that he is feeling much better.  I suspect back pain and chest pain are associated with influenza.  Per chart review, the patient was not cleared to stop his Coumadin.  We had a lengthy shared decision making conversation, and I advised him to follow-up to get his Coumadin restarted given his history of CVA.  Patient was agreeable to this plan at this time.  He is hemodynamically stable.  He is in no acute distress.  He is safe for discharge home with outpatient follow-up at this time.    Final Clinical Impressions(s) / ED Diagnoses   Final diagnoses:  Influenza A    ED Discharge Orders         Ordered    oseltamivir (TAMIFLU) 75 MG capsule  Every 12 hours     01/14/19 0536    ondansetron (ZOFRAN ODT) 4 MG disintegrating tablet  Every 8 hours PRN     01/14/19 0536    promethazine-dextromethorphan (PROMETHAZINE-DM) 6.25-15  MG/5ML syrup  4 times daily PRN     01/14/19 0536    acetaminophen (TYLENOL) 500 MG tablet  Every 8 hours PRN     01/14/19 0536           Frederik Pear,  A, PA-C 01/14/19 0818    ,  A, PA-C 01/14/19 [id number]    Nira Connardama, Pedro Eduardo, MD 01/15/19 [id number]

## 2019-01-14 NOTE — ED Notes (Signed)
Patient transported to X-ray 

## 2019-01-14 NOTE — Discharge Instructions (Signed)
Thank you for allowing me to care for you today in the Emergency Department.   You tested positive for influenza a today.  Tamiflu is an antiviral medication that can reduce your length of symptoms from the flu.  Your first dose was given tonight in the ER.  You should make sure that you are washing your hands frequently with warm water and soap and cover your mouth when you cough or wearing a mask if you have to go out in public.  You should not return to work or school until you are free from a fever for at least 24 hours.  If you can control your fever, you should start to feel much better.  You can take thousand milligrams of Tylenol once every 8 hours. You can also take 600 mg of ibuprofen with food once every 6 hours.  Take promethazine DM, 5 mL's by mouth, every 6 hours as needed for cough or congestion.  Please schedule follow-up appointment for recheck of your symptoms with your primary care provider in the next 3 to 4 days.  Please follow-up with the Coumadin clinic to restart this medication since you have been off of it for several months.  Return to the emergency department if you develop a high fever despite taking Tylenol and ibuprofen as described above, if you stop making urine, develop severe chest pain, respiratory distress, or other new, concerning symptoms.

## 2019-01-19 LAB — CULTURE, BLOOD (ROUTINE X 2)
CULTURE: NO GROWTH
Culture: NO GROWTH
SPECIAL REQUESTS: ADEQUATE
SPECIAL REQUESTS: ADEQUATE

## 2019-01-28 ENCOUNTER — Other Ambulatory Visit (INDEPENDENT_AMBULATORY_CARE_PROVIDER_SITE_OTHER): Payer: BLUE CROSS/BLUE SHIELD

## 2019-01-28 DIAGNOSIS — I639 Cerebral infarction, unspecified: Secondary | ICD-10-CM | POA: Diagnosis not present

## 2019-02-01 ENCOUNTER — Ambulatory Visit: Payer: BLUE CROSS/BLUE SHIELD | Admitting: *Deleted

## 2019-02-01 DIAGNOSIS — I240 Acute coronary thrombosis not resulting in myocardial infarction: Secondary | ICD-10-CM

## 2019-02-01 DIAGNOSIS — I513 Intracardiac thrombosis, not elsewhere classified: Secondary | ICD-10-CM

## 2019-02-01 DIAGNOSIS — I639 Cerebral infarction, unspecified: Secondary | ICD-10-CM

## 2019-02-01 DIAGNOSIS — Z7901 Long term (current) use of anticoagulants: Secondary | ICD-10-CM

## 2019-02-01 DIAGNOSIS — I76 Septic arterial embolism: Secondary | ICD-10-CM

## 2019-02-01 LAB — POCT INR: INR: 1.1 — AB (ref 2.0–3.0)

## 2019-02-01 MED ORDER — WARFARIN SODIUM 10 MG PO TABS: 10.0000 mg | ORAL_TABLET | Freq: Every day | ORAL | 0 refills | Status: DC

## 2019-02-01 NOTE — Patient Instructions (Addendum)
Description   Today take 1 tablet and tomorrow take 1.5 tablets then continue taking 1 tablet daily except 1/2 tablet on Mondays, Wednesdays, and Fridays.  Recheck in 1 week. Call us with any questions or concerns to [facility] @ [phone].      

## 2019-02-08 ENCOUNTER — Other Ambulatory Visit: Payer: Self-pay

## 2019-02-08 ENCOUNTER — Ambulatory Visit (INDEPENDENT_AMBULATORY_CARE_PROVIDER_SITE_OTHER): Payer: BLUE CROSS/BLUE SHIELD

## 2019-02-08 DIAGNOSIS — Z7901 Long term (current) use of anticoagulants: Secondary | ICD-10-CM | POA: Diagnosis not present

## 2019-02-08 DIAGNOSIS — I76 Septic arterial embolism: Secondary | ICD-10-CM

## 2019-02-08 DIAGNOSIS — I513 Intracardiac thrombosis, not elsewhere classified: Secondary | ICD-10-CM | POA: Diagnosis not present

## 2019-02-08 DIAGNOSIS — I240 Acute coronary thrombosis not resulting in myocardial infarction: Secondary | ICD-10-CM

## 2019-02-08 DIAGNOSIS — I639 Cerebral infarction, unspecified: Secondary | ICD-10-CM

## 2019-02-08 LAB — POCT INR: INR: 1.1 — AB (ref 2.0–3.0)

## 2019-02-08 NOTE — Patient Instructions (Signed)
Description   Today take 1 tablet and tomorrow take 1.5 tablets then continue taking 1 tablet daily except 1/2 tablet on Mondays, Wednesdays, and Fridays.  Recheck in 1 week. Call us with any questions or concerns to [facility] @ [phone].

## 2019-02-12 ENCOUNTER — Telehealth: Payer: Self-pay

## 2019-02-12 NOTE — Telephone Encounter (Signed)
LMOM FOR PRESCREEN  

## 2019-06-09 ENCOUNTER — Other Ambulatory Visit: Payer: Self-pay

## 2019-06-09 DIAGNOSIS — Z20822 Contact with and (suspected) exposure to covid-19: Secondary | ICD-10-CM

## 2019-06-13 ENCOUNTER — Encounter (HOSPITAL_COMMUNITY): Payer: Self-pay

## 2019-06-13 ENCOUNTER — Ambulatory Visit (HOSPITAL_COMMUNITY)
Admission: EM | Admit: 2019-06-13 | Discharge: 2019-06-13 | Disposition: A | Discharge status: Home / Self Care | Payer: BC Managed Care – PPO | Attending: Family Medicine | Admitting: Family Medicine

## 2019-06-13 ENCOUNTER — Other Ambulatory Visit: Payer: Self-pay

## 2019-06-13 DIAGNOSIS — Z7251 High risk heterosexual behavior: Secondary | ICD-10-CM | POA: Diagnosis not present

## 2019-06-13 DIAGNOSIS — Z202 Contact with and (suspected) exposure to infections with a predominantly sexual mode of transmission: Secondary | ICD-10-CM | POA: Insufficient documentation

## 2019-06-13 MED ORDER — LIDOCAINE HCL (PF) 1 % IJ SOLN
INTRAMUSCULAR | Status: AC
  Filled 2019-06-13: qty 2

## 2019-06-13 MED ORDER — CEFTRIAXONE SODIUM 250 MG IJ SOLR
INTRAMUSCULAR | Status: AC
  Filled 2019-06-13: qty 250

## 2019-06-13 MED ORDER — AZITHROMYCIN 250 MG PO TABS
1000.0000 mg | ORAL_TABLET | Freq: Once | ORAL | Status: AC
  Administered 2019-06-13: 1000 mg via ORAL

## 2019-06-13 MED ORDER — AZITHROMYCIN 250 MG PO TABS
ORAL_TABLET | ORAL | Status: AC
  Filled 2019-06-13: qty 4

## 2019-06-13 MED ORDER — CEFTRIAXONE SODIUM 250 MG IJ SOLR
250.0000 mg | Freq: Once | INTRAMUSCULAR | Status: AC
  Administered 2019-06-13: 250 mg via INTRAMUSCULAR

## 2019-06-13 NOTE — Discharge Instructions (Signed)
Your STD tests are pending.  You were treated today with 2 antibiotics, Rocephin and Zithromax.    Do not have sex until your STD tests are back and are negative.    Practice safe sex.  Wear a condom.    Follow-up with your primary care provider or return here if you develop any sores on your penis, penile discharge, testicular pain, difficulty with urination, abdominal pain, back pain, fever.

## 2019-06-13 NOTE — ED Provider Notes (Signed)
[facility]    CSN: [id number] Arrival date & time: 06/13/19  1340     History   Chief Complaint Chief Complaint  Patient presents with  . Exposure to STD    HPI Manuel Baker is a 23 y.o. male.   Patient presents with request for STD testing and treatment.  He states his sexual partner is having symptoms of vaginal discharge and a "sore" on her labia.  He denies penile discharge, testicular pain, lesions, dysuria, abdominal pain, back pain, fever.  Patient declines testing for HIV.  He is sexually active with multiple partners and does not use condoms.  Patient reports history of chlamydia.     The history is provided by the patient.    Past Medical History:  Diagnosis Date  . Congenital heart disease   . Diabetes mellitus without complication (HCC)   . Seizures [facility](HCC)     Patient Active Problem List   Diagnosis Date Noted  . Long term (current) use of anticoagulants [Z79.01] 01/12/2018  . Septic embolism (HCC)   . Libman Sacks endocarditis (HCC)   . Leucocytosis   . [place] endocarditis   . 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 Uncontrolled type 1 diabetes mellitus with hyperglycemia (HCC) 01/03/2018  . Seizure (HCC) 01/03/2018  . Stroke (cerebrum) (HCC) 01/03/2018  . Stroke (HCC) 01/03/2018  . Ebstein's anomaly     Past Surgical History:  Procedure Laterality Date  . open heart surgery    . TEE WITHOUT CARDIOVERSION N/A 01/06/2018   Procedure: TRANSESOPHAGEAL ECHOCARDIOGRAM (TEE);  Surgeon: Wendall StadeNishan, Peter C, MD;  Location: [facility];  Service: Cardiovascular;  Laterality: N/A;       Home Medications    Prior to Admission medications   Medication Sig Start Date End Date Taking? Authorizing Provider  acetaminophen (TYLENOL) 500 MG tablet Take 2 tablets (1,000 mg total) by mouth every 8 (eight) hours as needed for fever. 01/14/19   McDonald, Mia A, PA-C  insulin glargine (LANTUS) 100  UNIT/ML injection Inject 30 Units into the skin at bedtime.    [provider]  insulin lispro (HUMALOG) 100 UNIT/ML injection Inject 10-30 Units into the skin See admin instructions. Inject 10-30 units into the skin three times a day before meals, per sliding scale    [provider]  Insulin Syringe-Needle U-100 (INSULIN SYRINGE .3CC/31GX5/16") 31G X 5/16" 0.3 ML MISC 1 Act by Does not apply route 4 (four) times daily -  before meals and at bedtime. 01/09/18   Rolly SalterPatel, Pranav M, MD  ondansetron (ZOFRAN ODT) 4 MG disintegrating tablet Take 1 tablet (4 mg total) by mouth every 8 (eight) hours as needed for nausea or vomiting. 01/14/19   McDonald, Mia A, PA-C  oseltamivir (TAMIFLU) 75 MG capsule Take 1 capsule (75 mg total) by mouth every 12 (twelve) hours. 01/14/19   McDonald, Mia A, PA-C  promethazine-dextromethorphan (PROMETHAZINE-DM) 6.25-15 MG/5ML syrup Take 5 mLs by mouth 4 (four) times daily as needed for cough. 01/14/19   McDonald, Mia A, PA-C  warfarin (COUMADIN) 10 MG tablet Take 1 tablet (10 mg total) by mouth daily. Take 1 tab daily except 1/2 tab on Mon, Wed, and Fri or as directed by Coumadin Clinic. 02/01/19   Nahser, Deloris PingPhilip J, MD    Family History Family History  Problem Relation Age of Onset  . Diabetes Mellitus I Sister     Social History Social History   Tobacco Use  .  Smoking status: Never Smoker  . Smokeless tobacco: Never Used  Substance Use Topics  . Alcohol use: Yes    Frequency: Never    Comment: social  . Drug use: Yes    Types: Marijuana     Allergies   Patient has no known allergies.   Review of Systems Review of Systems  Constitutional: Negative for chills and fever.  HENT: Negative for ear pain and sore throat.   Eyes: Negative for pain and visual disturbance.  Respiratory: Negative for cough.   Cardiovascular: Negative for palpitations.  Gastrointestinal: Negative for vomiting.  Genitourinary: Negative for discharge, dysuria, flank  pain, hematuria, penile pain and testicular pain.  Musculoskeletal: Negative for arthralgias and back pain.  Skin: Negative for color change, rash and wound.  Neurological: Negative for seizures and syncope.  All other systems reviewed and are negative.    Physical Exam Triage Vital Signs ED Triage Vitals  Enc Vitals Group     BP 06/13/19 1411 (!) 143/92     Pulse Rate 06/13/19 1411 (!) 101     Resp 06/13/19 1411 19     Temp 06/13/19 1411 98.6 F (37 C)     Temp src --      SpO2 06/13/19 1411 98 %     Weight --      Height --      Head Circumference --      Peak Flow --      Pain Score 06/13/19 1412 0     Pain Loc --      Pain Edu? --      Excl. in GC? --    No data found.  Updated Vital Signs BP (!) 143/92   Pulse (!) 101   Temp 98.6 F (37 C)   Resp 19   SpO2 98%   Visual Acuity Right Eye Distance:   Left Eye Distance:   Bilateral Distance:    Right Eye Near:   Left Eye Near:    Bilateral Near:     Physical Exam Vitals signs and nursing note reviewed.  Constitutional:      Appearance: He is well-developed.  HENT:     Head: Normocephalic and atraumatic.  Eyes:     Conjunctiva/sclera: Conjunctivae normal.  Neck:     Musculoskeletal: Neck supple.  Cardiovascular:     Rate and Rhythm: Normal rate and regular rhythm.     Heart sounds: No murmur.  Pulmonary:     Effort: Pulmonary effort is normal. No respiratory distress.     Breath sounds: Normal breath sounds.  Abdominal:     Palpations: Abdomen is soft.     Tenderness: There is no abdominal tenderness. There is no right CVA tenderness, left CVA tenderness, guarding or rebound.  Genitourinary:    Penis: Normal.      Scrotum/Testes: Normal.  Skin:    General: Skin is warm and dry.  Neurological:     Mental Status: He is alert.      UC Treatments / Results  Labs (all labs ordered are listed, but only abnormal results are displayed) Labs Reviewed  URINE CYTOLOGY ANCILLARY ONLY    EKG    Radiology No results found.  Procedures Procedures (including critical care time)  Medications Ordered in UC Medications  azithromycin (ZITHROMAX) tablet 1,000 mg (1,000 mg Oral Given 06/13/19 1445)  cefTRIAXone (ROCEPHIN) injection 250 mg (250 mg Intramuscular Given 06/13/19 1445)  azithromycin (ZITHROMAX) 250 MG tablet (has no administration in time range)  cefTRIAXone (  ROCEPHIN) 250 MG injection (has no administration in time range)  lidocaine (PF) (XYLOCAINE) 1 % injection (has no administration in time range)    Initial Impression / Assessment and Plan / UC Course  I have reviewed the triage vital signs and the nursing notes.  Pertinent labs & imaging results that were available during my care of the patient were reviewed by me and considered in my medical decision making (see chart for details).   Unprotected sex and potential exposure to STD.  Treated today with Rocephin and Zithromax.  Urine sent for cytology.  Instructed patient to abstain from sex until his STD test results are back and are negative.  Discussed with patient the need to practice safe sex including wearing condoms.  Discussed with patient to consider having HIV test; he declines today.  Discussed that he should return here or follow-up with his primary care provider if he develops lesions, penile discharge, testicular pain, dysuria, abdominal pain, back pain, fever, other symptoms.     Final Clinical Impressions(s) / UC Diagnoses   Final diagnoses:  Unprotected sex  Possible exposure to STD     Discharge Instructions     Your STD tests are pending.  You were treated today with 2 antibiotics, Rocephin and Zithromax.    Do not have sex until your STD tests are back and are negative.    Practice safe sex.  Wear a condom.    Follow-up with your primary care provider or return here if you develop any sores on your penis, penile discharge, testicular pain, difficulty with urination, abdominal pain, back pain,  fever.        ED Prescriptions    None     Controlled Substance Prescriptions [place] Controlled Substance Registry consulted? Not Applicable   Sharion Balloon, NP 06/13/19 1458

## 2019-06-13 NOTE — ED Triage Notes (Signed)
Pt presents with complaints of concern for STD after partner began experiencing symptoms.

## 2019-06-14 LAB — NOVEL CORONAVIRUS, NAA: SARS-CoV-2, NAA: NOT DETECTED

## 2019-06-15 DIAGNOSIS — E1065 Type 1 diabetes mellitus with hyperglycemia: Secondary | ICD-10-CM | POA: Diagnosis not present

## 2019-06-16 ENCOUNTER — Telehealth: Payer: Self-pay | Admitting: Physician Assistant

## 2019-06-16 ENCOUNTER — Other Ambulatory Visit: Payer: Self-pay

## 2019-06-16 ENCOUNTER — Telehealth (INDEPENDENT_AMBULATORY_CARE_PROVIDER_SITE_OTHER): Payer: BC Managed Care – PPO | Admitting: Physician Assistant

## 2019-06-16 ENCOUNTER — Telehealth: Payer: Self-pay

## 2019-06-16 ENCOUNTER — Encounter: Payer: Self-pay | Admitting: Physician Assistant

## 2019-06-16 VITALS — Ht 66.0 in | Wt 120.0 lb

## 2019-06-16 DIAGNOSIS — Q211 Atrial septal defect, unspecified: Secondary | ICD-10-CM

## 2019-06-16 DIAGNOSIS — Q225 Ebstein's anomaly: Secondary | ICD-10-CM

## 2019-06-16 DIAGNOSIS — I240 Acute coronary thrombosis not resulting in myocardial infarction: Secondary | ICD-10-CM

## 2019-06-16 LAB — URINE CYTOLOGY ANCILLARY ONLY
Chlamydia: NEGATIVE
Neisseria Gonorrhea: NEGATIVE
Trichomonas: NEGATIVE

## 2019-06-16 NOTE — Progress Notes (Signed)
Virtual Visit via Video Note   This visit type was conducted due to national recommendations for restrictions regarding the COVID-19 Pandemic (e.g. social distancing) in an effort to limit this patient's exposure and mitigate transmission in our community.  Due to his co-morbid illnesses, this patient is at least at moderate risk for complications without adequate follow up.  This format is felt to be most appropriate for this patient at this time.  All issues noted in this document were discussed and addressed.  A limited physical exam was performed with this format.  Please refer to the patient's chart for his consent to telehealth for [facility].   Date:  06/16/2019   ID:  Manuel Baker, DOB Aug 10, 1996, MRN [id number]  Patient Location: Home Provider Location: Home  PCP:  Andres Labrum [facility]  Cardiologist:  Mertie Moores, MD   Evaluation Performed:  Follow-Up Visit  Chief Complaint:  9 months follow up  History of Present Illness:    Manuel Baker is a 23 y.o. 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  He is a Ship broker at [employer]. Patient used to have significant shortness of breath with exertion, cyanosis of his lips and fingertips and clubbing. Since his surgery at age 66 he is not had any of these symptoms. He is able to exercise on a regular basis. He walks on the treadmill or participate in sports without severe shortness of breath or cyanosis.  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 Vegetation. 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 There is also significant shunting from right to left across an ASD, indicating elevated pulmonary pressure.  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 Was placed on coumadin.    Recommended follow up at [facility] but unable to do so due to transportation issue.  Last seen by Lyda Jester, [facility] 09/2018 after ER visit. Recommended restarting coumadin. Plan to get another appointment at [facility].   Seen virtually for follow-up.  He has not been taking Coumadin for many months.  Now has car and interested in referral to [facility].  Unable to do extraneous activity due to shortness of breath.  He does mild walking without chest pain.  No dizziness, syncope, palpitation, chest pain, orthopnea, PND or lower extremity edema.  The patient does not have symptoms concerning for COVID-19 infection (fever, chills, cough, or new shortness of breath).    Past Medical History:  Diagnosis Date  . Congenital heart disease   . Diabetes mellitus without complication ([place])   . Seizures ([place])    Past Surgical History:  Procedure Laterality Date  . open heart surgery    . TEE WITHOUT CARDIOVERSION N/A 01/06/2018   Procedure: TRANSESOPHAGEAL ECHOCARDIOGRAM (TEE);  Surgeon: Josue Hector, MD;  Location: [facility] ENDOSCOPY;  Service: Cardiovascular;  Laterality: N/A;     Current Meds  Medication Sig  . acetaminophen (TYLENOL) 500 MG tablet Take 2 tablets (1,000 mg total) by mouth every 8 (eight) hours as needed for fever.  . insulin glargine (LANTUS) 100 UNIT/ML injection Inject 30 Units into the skin at bedtime.  . insulin lispro (HUMALOG) 100 UNIT/ML injection Inject 10-30 Units into the skin See admin instructions. Inject 10-30 units into the skin three times a day before meals, per sliding scale  .  Insulin Syringe-Needle U-100 (INSULIN SYRINGE .3CC/31GX5/16") 31G X 5/16" 0.3 ML MISC 1 Act by Does not apply route 4 (four) times daily -  before meals and at bedtime.  . ondansetron (ZOFRAN ODT) 4 MG disintegrating tablet Take 1 tablet (4 mg total) by mouth every 8 (eight) hours as needed for nausea or vomiting.  Marland Kitchen. oseltamivir (TAMIFLU) 75 MG capsule Take 1 capsule (75 mg total) by mouth every 12  (twelve) hours.  . promethazine-dextromethorphan (PROMETHAZINE-DM) 6.25-15 MG/5ML syrup Take 5 mLs by mouth 4 (four) times daily as needed for cough.  . warfarin (COUMADIN) 10 MG tablet Take 1 tablet (10 mg total) by mouth daily. Take 1 tab daily except 1/2 tab on Mon, Wed, and Fri or as directed by Coumadin Clinic.     Allergies:   Patient has no known allergies.   Social History   Tobacco Use  . Smoking status: Never Smoker  . Smokeless tobacco: Never Used  Substance Use Topics  . Alcohol use: Yes    Frequency: Never    Comment: social  . Drug use: Yes    Types: Marijuana     Family Hx: The patient's family history includes Diabetes Mellitus I in his sister.  ROS:   Please see the history of present illness.    All other systems reviewed and are negative.   Prior CV studies:   The following studies were reviewed today:  TEE 12/2017 Conclusions  - Left ventricle: Systolic function was normal. The estimated   ejection fraction was in the range of 50% to 55%. - Left atrium: No evidence of thrombus in the atrial cavity or   appendage. - Right ventricle: The RV is severely dilated and hypokinetic - Right atrium: Large thrombus burden in dome of RA. The atrium was   massively dilated. - Atrial septum: There appears to be a large PFO or ASD. There is   markedly positive right to left shunting by bubble study   High likelyhood of paradoxic emboli caused TIA - Tricuspid valve: The patient is apparently post Ebsteins Anomaly   repair at a young age. However the septal leaflet is not apically   displaced. The TV is morphologically markedly abnormal. There   appears to be an annuloplasty ring. There is failure of leaflet   coaptation with wide open TR. The lateral leaflet is thickened   with likely 1 cm vegetation. There appears to be a large thrombus   burden in the dome of the RA. - Impressions: 3D imaging of the tricuspid valve, RA and atrial   septum were performed and  confirmed malcoaptation of the lateral   and posterior leaflets of the TV   and thrombus in the dome of the RA.  Impressions:  - 3D imaging of the tricuspid valve, RA and atrial septum were   performed and confirmed malcoaptation of the lateral and   posterior leaflets of the TV   and thrombus in the dome of the RA.  Labs/Other Tests and Data Reviewed:    EKG:  No ECG reviewed.  Recent Labs: 01/14/2019: ALT 25; BUN 13; Creatinine, Ser 1.49; Hemoglobin 16.3; Platelets 214; Potassium 4.1; Sodium 134   Recent Lipid Panel Lab Results  Component Value Date/Time   CHOL 137 01/03/2018 06:23 AM   TRIG 97 01/03/2018 06:23 AM   HDL 49 01/03/2018 06:23 AM   CHOLHDL 2.8 01/03/2018 06:23 AM   LDLCALC 69 01/03/2018 06:23 AM    Wt Readings from Last 3 Encounters:  06/16/19  120 lb (54.4 kg)  01/13/19 120 lb (54.4 kg)  10/20/18 115 lb 1.9 oz (52.2 kg)     Objective:    Vital Signs:  Ht 5\' 6"  (1.676 m)   Wt 120 lb (54.4 kg)   BMI 19.37 kg/m    VITAL SIGNS:  reviewed GEN:  no acute distress EYES:  sclerae anicteric, EOMI - Extraocular Movements Intact RESPIRATORY:  normal respiratory effort, symmetric expansion CARDIOVASCULAR:  no peripheral edema SKIN:  no rash, lesions or ulcers. MUSCULOSKELETAL:  no obvious deformities. NEURO:  alert and oriented x 3, no obvious focal deficit PSYCH:  normal affect  ASSESSMENT & PLAN:    1. Ebstein's Enomaly - [facility] at age 89. TEE 12/2017 for stroke showed severe RV failure with TR. Also found to have large ASD with R >> L shunt across ASD. [facility] due to [facility] and fund issue. -He is now interested and will refer him again.  2. Hx of CVA with RV thrombus -Noncompliant with Coumadin.  He is interested.  Will refer to the Coumadin clinic.  3. Type 1 DM -Compliant with insulin.  COVID-19 Education: The signs and symptoms of COVID-19 were discussed with the patient and how to seek care for  testing (follow up with PCP or arrange E-visit).  The importance of social distancing was discussed today.  Time:   Today, I have spent  10  minutes with the patient with telehealth technology discussing the above problems.     Medication Adjustments/Labs and Tests Ordered: Current medicines are reviewed at length with the patient today.  Concerns regarding medicines are outlined above.   Tests Ordered: No orders of the defined types were placed in this encounter.   Medication Changes: No orders of the defined types were placed in this encounter.   Follow Up:  In Person in 6 month(s)  Signed, Manson PasseyBhavinkumar Nayden Czajka, [place]  06/16/2019 1:16 PM    [facility]

## 2019-06-16 NOTE — Telephone Encounter (Signed)
New Message ° ° ° °Left message to confirm appt and get consent  °

## 2019-06-16 NOTE — Telephone Encounter (Signed)
Virtual Visit Pre-Appointment Phone Call  "(Name), I am calling you today to discuss your upcoming appointment. We are currently trying to limit exposure to the virus that causes COVID-19 by seeing patients at home rather than in the office."  1. "What is the BEST phone number to call the day of the visit?" - include this in appointment notes  2. "Do you have or have access to (through a family member/friend) a smartphone with video capability that we can use for your visit?" a. If yes - list this number in appt notes as "cell" (if different from BEST phone #) and list the appointment type as a VIDEO visit in appointment notes b. If no - list the appointment type as a PHONE visit in appointment notes  3. Confirm consent - "In the setting of the current Covid19 crisis, you are scheduled for a (phone or video) visit with your provider on (date) at (time).  Just as we do with many in-office visits, in order for you to participate in this visit, we must obtain consent.  If you'd like, I can send this to your mychart (if signed up) or email for you to review.  Otherwise, I can obtain your verbal consent now.  All virtual visits are billed to your insurance company just like a normal visit would be.  By agreeing to a virtual visit, we'd like you to understand that the technology does not allow for your provider to perform an examination, and thus may limit your provider's ability to fully assess your condition. If your provider identifies any concerns that need to be evaluated in person, we will make arrangements to do so.  Finally, though the technology is pretty good, we cannot assure that it will always work on either your or our end, and in the setting of a video visit, we may have to convert it to a phone-only visit.  In either situation, we cannot ensure that we have a secure connection.  Are you willing to proceed?" STAFF: Did the patient verbally acknowledge consent to telehealth visit? Document  YES/NO here: YES  4. Advise patient to be prepared - "Two hours prior to your appointment, go ahead and check your blood pressure, pulse, oxygen saturation, and your weight (if you have the equipment to check those) and write them all down. When your visit starts, your provider will ask you for this information. If you have an Apple Watch or Kardia device, please plan to have heart rate information ready on the day of your appointment. Please have a pen and paper handy nearby the day of the visit as well."  5. Give patient instructions for MyChart download to smartphone OR Doximity/Doxy.me as below if video visit (depending on what platform provider is using)  6. Inform patient they will receive a phone call 15 minutes prior to their appointment time (may be from unknown caller ID) so they should be prepared to answer    TELEPHONE CALL NOTE  Manuel Baker has been deemed a candidate for a follow-up tele-health visit to limit community exposure during the Covid-19 pandemic. I spoke with the patient via phone to ensure availability of phone/video source, confirm preferred email & phone number, and discuss instructions and expectations.  I reminded Manuel Baker to be prepared with any vital sign and/or heart rhythm information that could potentially be obtained via home monitoring, at the time of his visit. I reminded Manuel Baker to expect a phone call prior to his visit.  Michaelle Copasatasha  Neera Teng, CMA 06/16/2019 1:55 PM   INSTRUCTIONS FOR DOWNLOADING THE MYCHART APP TO SMARTPHONE  - The patient must first make sure to have activated MyChart and know their login information - If Apple, go to [employer] Store and type in MyChart in the search bar and download the app. If Android, ask patient to go to [employer] Play Store and type in [place] in the search bar and download the app. The app is free but as with any other app downloads, their phone may require them to verify saved payment information or Apple/Android  password.  - The patient will need to then log into the app with their MyChart username and password, and select [facility] as their healthcare provider to link the account. When it is time for your visit, go to the MyChart app, find appointments, and click Begin Video Visit. Be sure to Select Allow for your device to access the Microphone and Camera for your visit. You will then be connected, and your provider will be with you shortly.  **If they have any issues connecting, or need assistance please contact MyChart service desk [phone])**  **If using a computer, in order to ensure the best quality for their visit they will need to use either of the following Internet Browsers: [employer] Edge, or Google Chrome**  IF USING DOXIMITY or DOXY.ME - The patient will receive a link just prior to their visit by text.     FULL LENGTH CONSENT FOR TELE-HEALTH VISIT   I hereby voluntarily request, consent and authorize [facility] and its employed or contracted physicians, physician assistants, nurse practitioners or other licensed health care professionals (the Practitioner), to provide me with telemedicine health care services (the "Services") as deemed necessary by the treating Practitioner. I acknowledge and consent to receive the Services by the Practitioner via telemedicine. I understand that the telemedicine visit will involve communicating with the Practitioner through live audiovisual communication technology and the disclosure of certain medical information by electronic transmission. I acknowledge that I have been given the opportunity to request an in-person assessment or other available alternative prior to the telemedicine visit and am voluntarily participating in the telemedicine visit.  I understand that I have the right to withhold or withdraw my consent to the use of telemedicine in the course of my care at any time, without affecting my right to future care or treatment,  and that the Practitioner or I may terminate the telemedicine visit at any time. I understand that I have the right to inspect all information obtained and/or recorded in the course of the telemedicine visit and may receive copies of available information for a reasonable fee.  I understand that some of the potential risks of receiving the Services via telemedicine include:  Marland Kitchen. Delay or interruption in medical evaluation due to technological equipment failure or disruption; . Information transmitted may not be sufficient (e.g. poor resolution of images) to allow for appropriate medical decision making by the Practitioner; and/or  . In rare instances, security protocols could fail, causing a breach of personal health information.  Furthermore, I acknowledge that it is my responsibility to provide information about my medical history, conditions and care that is complete and accurate to the best of my ability. I acknowledge that Practitioner's advice, recommendations, and/or decision may be based on factors not within their control, such as incomplete or inaccurate data provided by me or distortions of diagnostic images or specimens that may result from electronic transmissions. I understand that  the practice of medicine is not an exact science and that Practitioner makes no warranties or guarantees regarding treatment outcomes. I acknowledge that I will receive a copy of this consent concurrently upon execution via email to the email address I last provided but may also request a printed copy by calling the office of [place].    I understand that my insurance will be billed for this visit.   I have read or had this consent read to me. . I understand the contents of this consent, which adequately explains the benefits and risks of the Services being provided via telemedicine.  . I have been provided ample opportunity to ask questions regarding this consent and the Services and have had my questions  answered to my satisfaction. . I give my informed consent for the services to be provided through the use of telemedicine in my medical care  By participating in this telemedicine visit I agree to the above.

## 2019-06-16 NOTE — Patient Instructions (Addendum)
Medication Instructions:  Your physician recommends that you continue on your current medications as directed. Please refer to the Current Medication list given to you today.  If you need a refill on your cardiac medications before your next appointment, please call your pharmacy.   Lab work: NONE If you have labs (blood work) drawn today and your tests are completely normal, you will receive your results only by: Marland Kitchen MyChart Message (if you have MyChart) OR . A paper copy in the mail If you have any lab test that is abnormal or we need to change your treatment, we will call you to review the results.  Testing/Procedures: NONE  Follow-Up: At [facility], you and your health needs are our priority.  As part of our continuing mission to provide you with exceptional heart care, we have created designated Provider Care Teams.  These Care Teams include your primary Cardiologist (physician) and Advanced Practice Providers (APPs -  Physician Assistants and Nurse Practitioners) who all work together to provide you with the care you need, when you need it. You will need a follow up appointment in:  4 months.  You may see Mertie Moores, MD or one of the following Advanced Practice Providers on your designated Care Team: Richardson Dopp, PA-C [place], [place] . Daune Perch, NP  Your physician recommends that you schedule a follow-up appointment WITH [facility].   You have been referred to [place].     Any Other Special Instructions Will Be Listed Below (If Applicable).

## 2019-07-05 ENCOUNTER — Telehealth: Payer: Self-pay

## 2019-07-05 NOTE — Telephone Encounter (Signed)
lmom for overdue inr 

## 2019-07-07 ENCOUNTER — Other Ambulatory Visit: Payer: Self-pay

## 2019-07-07 DIAGNOSIS — Z20822 Contact with and (suspected) exposure to covid-19: Secondary | ICD-10-CM

## 2019-07-08 LAB — NOVEL CORONAVIRUS, NAA: SARS-CoV-2, NAA: NOT DETECTED

## 2019-07-12 DIAGNOSIS — S00501A Unspecified superficial injury of lip, initial encounter: Secondary | ICD-10-CM | POA: Diagnosis not present

## 2019-07-20 DIAGNOSIS — J029 Acute pharyngitis, unspecified: Secondary | ICD-10-CM | POA: Diagnosis not present

## 2019-07-20 DIAGNOSIS — Z20818 Contact with and (suspected) exposure to other bacterial communicable diseases: Secondary | ICD-10-CM | POA: Diagnosis not present

## 2019-07-23 DIAGNOSIS — R6883 Chills (without fever): Secondary | ICD-10-CM | POA: Diagnosis not present

## 2019-07-23 DIAGNOSIS — R5383 Other fatigue: Secondary | ICD-10-CM | POA: Diagnosis not present

## 2019-07-23 DIAGNOSIS — J029 Acute pharyngitis, unspecified: Secondary | ICD-10-CM | POA: Diagnosis not present

## 2019-08-24 IMAGING — CT CT HEAD W/O CM
3 series · 15 of 47 positions shown, 18 images · non-contrast
Comparison: None.

CLINICAL DATA: Seizure, new, nontraumatic, 18-40 years.

EXAM:
CT HEAD WITHOUT CONTRAST
TECHNIQUE: Contiguous axial images were obtained from the base of the skull
through the vertex without intravenous contrast.

[Series 3: head 5.0 h30s · axial · 0.42mm/px · z∈[-97,+43]mm · 9 of 34 slices shown, 12 images]
[im 3/34  brain]
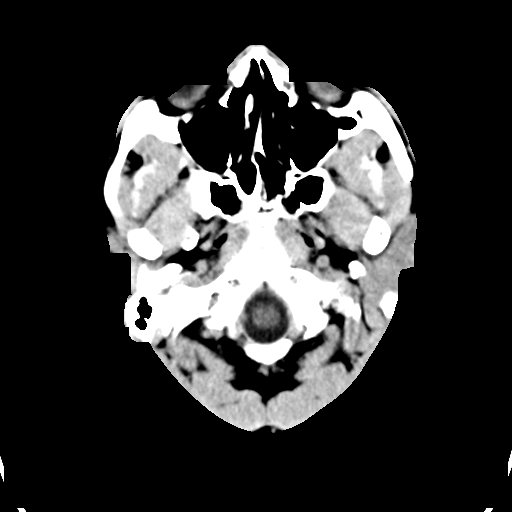
[im 3/34  bone]
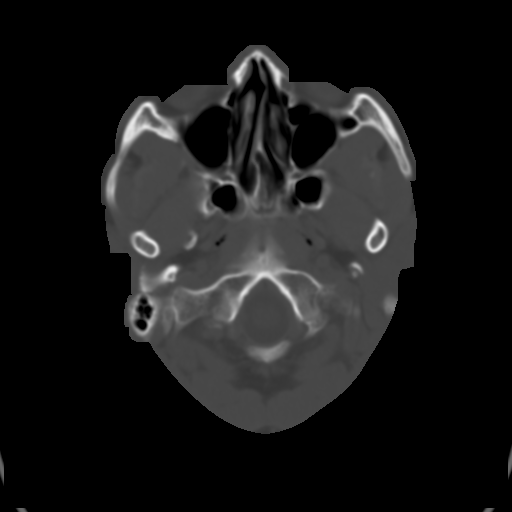
[im 6/34  brain]
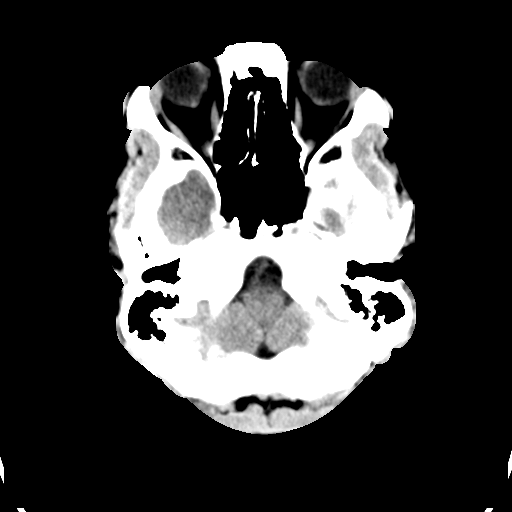
[im 10/34  brain]
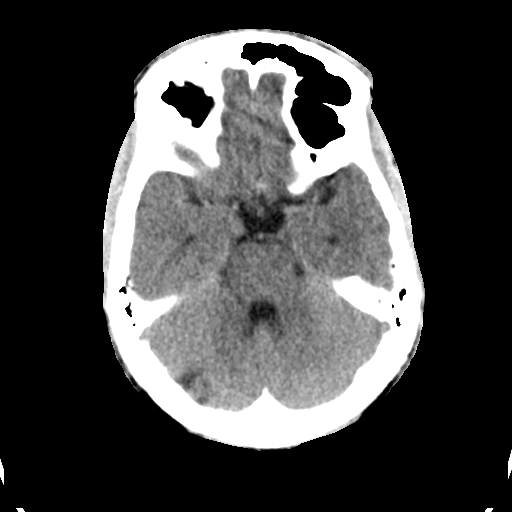
[im 13/34  brain]
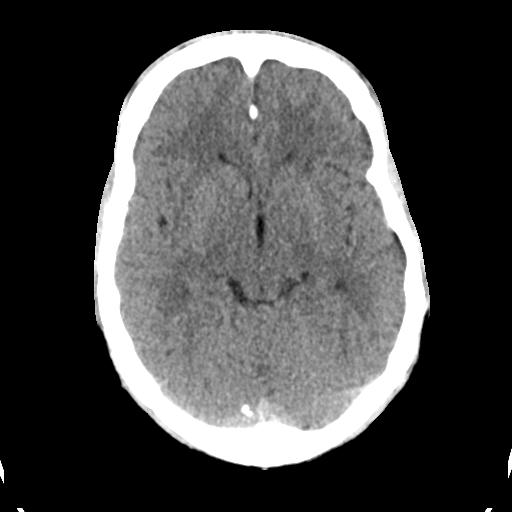
[im 18/34  brain]
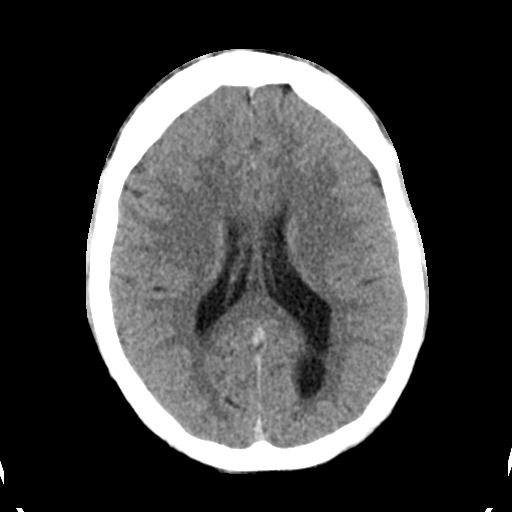
[im 18/34  bone]
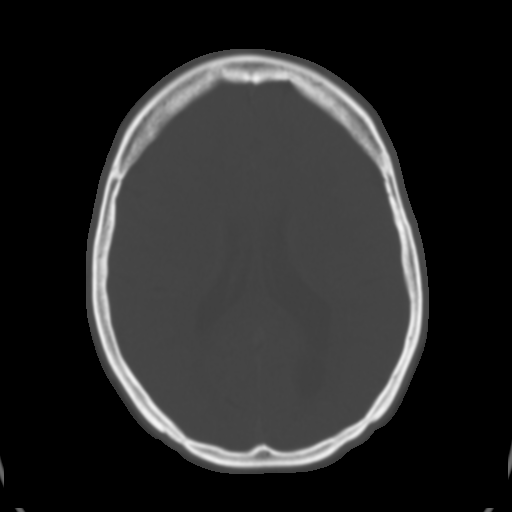
[im 21/34  brain]
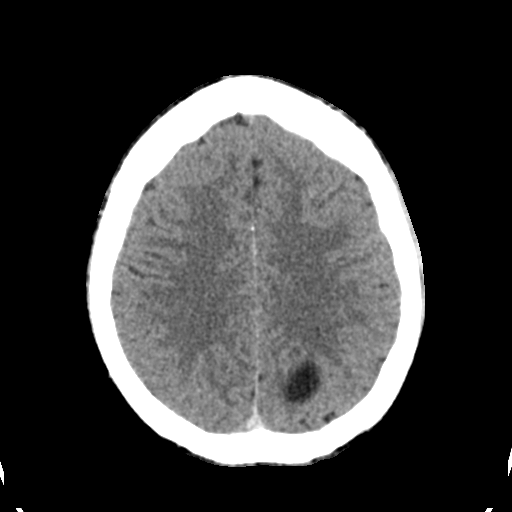
[im 24/34  brain]
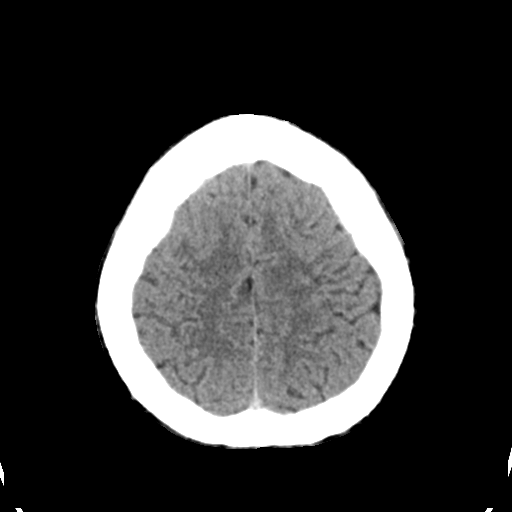
[im 28/34  brain]
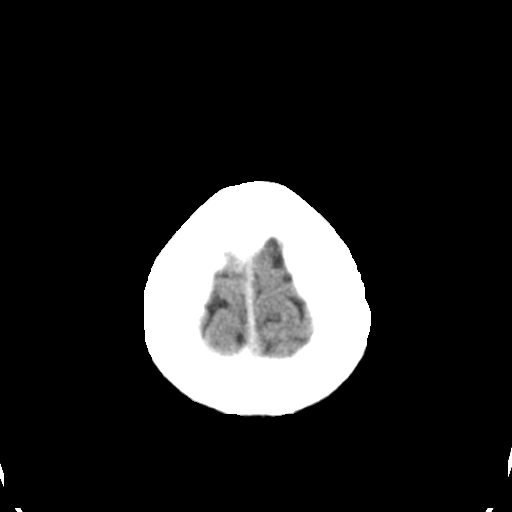
[im 31/34  brain]
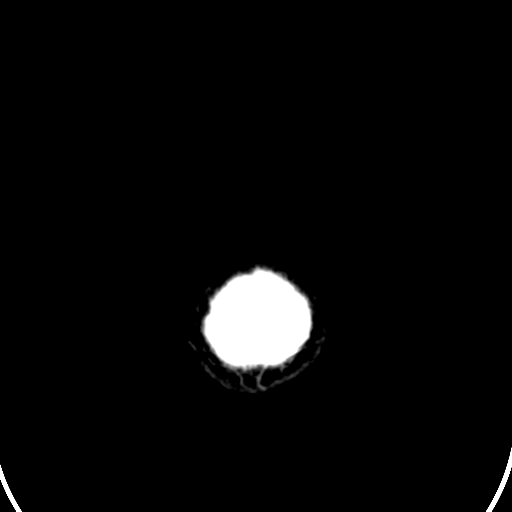
[im 31/34  bone]
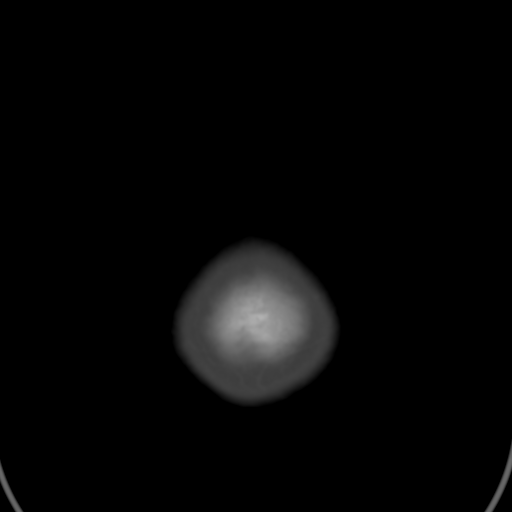

[Series 5: head 3.0 mpr cor · coronal · 0.31mm/px · 3 of 67 slices shown]
[im 23/67  brain]
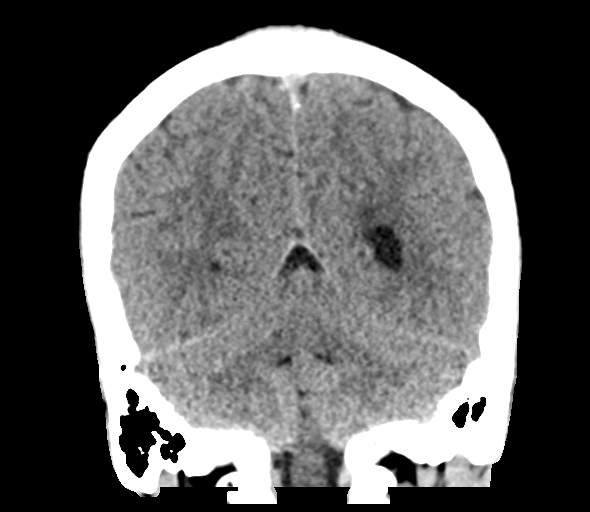
[im 30/67  brain]
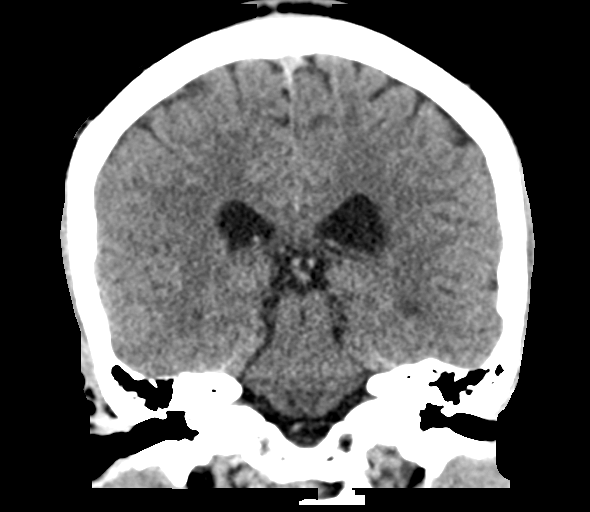
[im 37/67  brain]
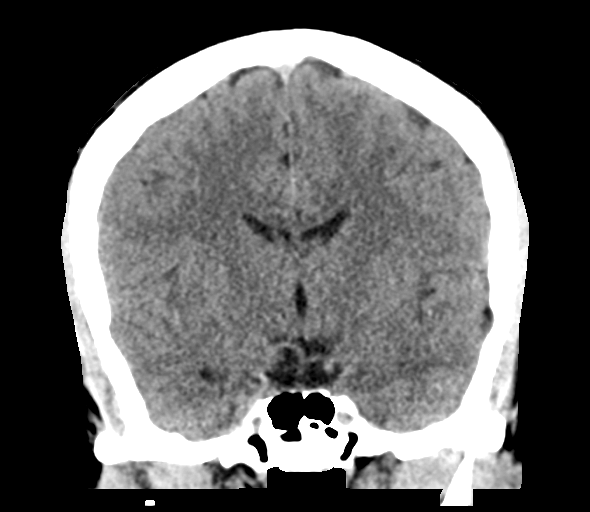

[Series 6: head 3.0 mpr sag · sagittal · 0.31mm/px · 3 of 62 slices shown]
[im 21/62  brain]
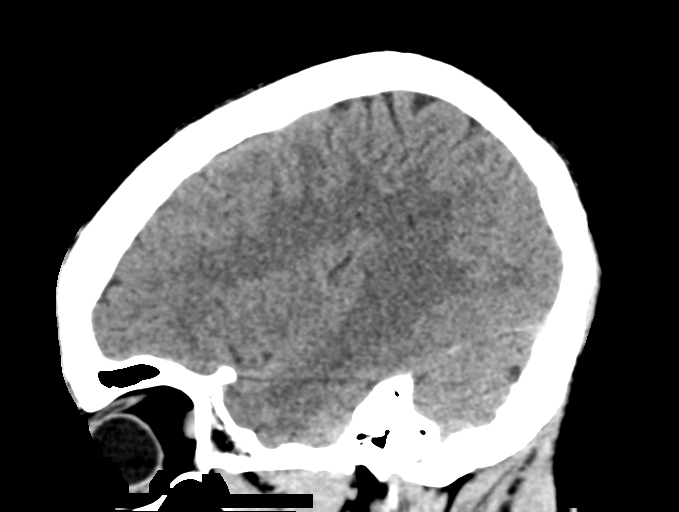
[im 31/62  brain]
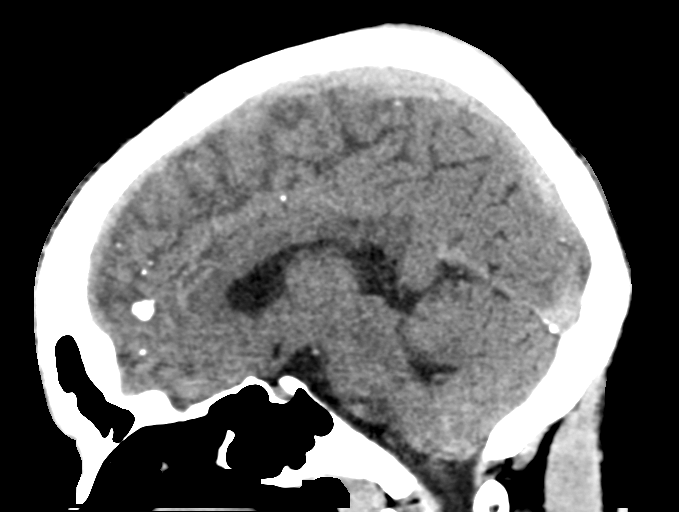
[im 41/62  brain]
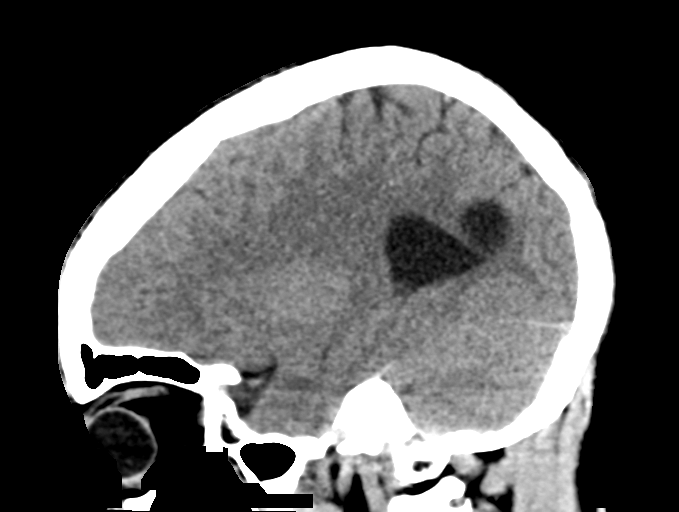

[15 of 47 positions shown; findings below may reference images not displayed]

FINDINGS: Brain: A benign-appearing cyst adjacent to the posterior horn of the
left lateral ventricle measures 2.4 x 2.2 x 2.2 cm. There is focal
dilation of the posterior horn of left lateral ventricle as well.

No acute infarct, hemorrhage, or mass lesion is present. No
migrational anomaly is evident. Temporal lobes are unremarkable.
Remote lacunar infarcts are present posteriorly in the right
cerebellum. Brainstem and cerebellum are otherwise normal.

Vascular: No hyperdense vessel or unexpected calcification.

Skull: Calvarium is intact. No focal lytic or blastic lesions are
present. No significant extracranial soft tissue injury is present.

Sinuses/Orbits: The paranasal sinuses and mastoid air cells are
clear. Globes and orbits are within normal limits.

Other: None.
IMPRESSION: 1. Benign-appearing cyst in the left occipital lobe likely
represents a neural glial cyst. There is no definite communication
with the lateral ventricle. MRI of the head without and with
contrast could be used for further evaluation.
2. No acute intracranial abnormality or focal abnormality to explain
seizures otherwise.

## 2019-08-28 IMAGING — DX DG ORTHOPANTOGRAM /PANORAMIC
1 series · 1 of 1 positions shown · non-contrast
Comparison: None.

CLINICAL DATA: 21-year-old presenting with acute endocarditis.
Evaluate dentition.

EXAM:
ORTHOPANTOGRAM/PANORAMIC

[view not recorded]
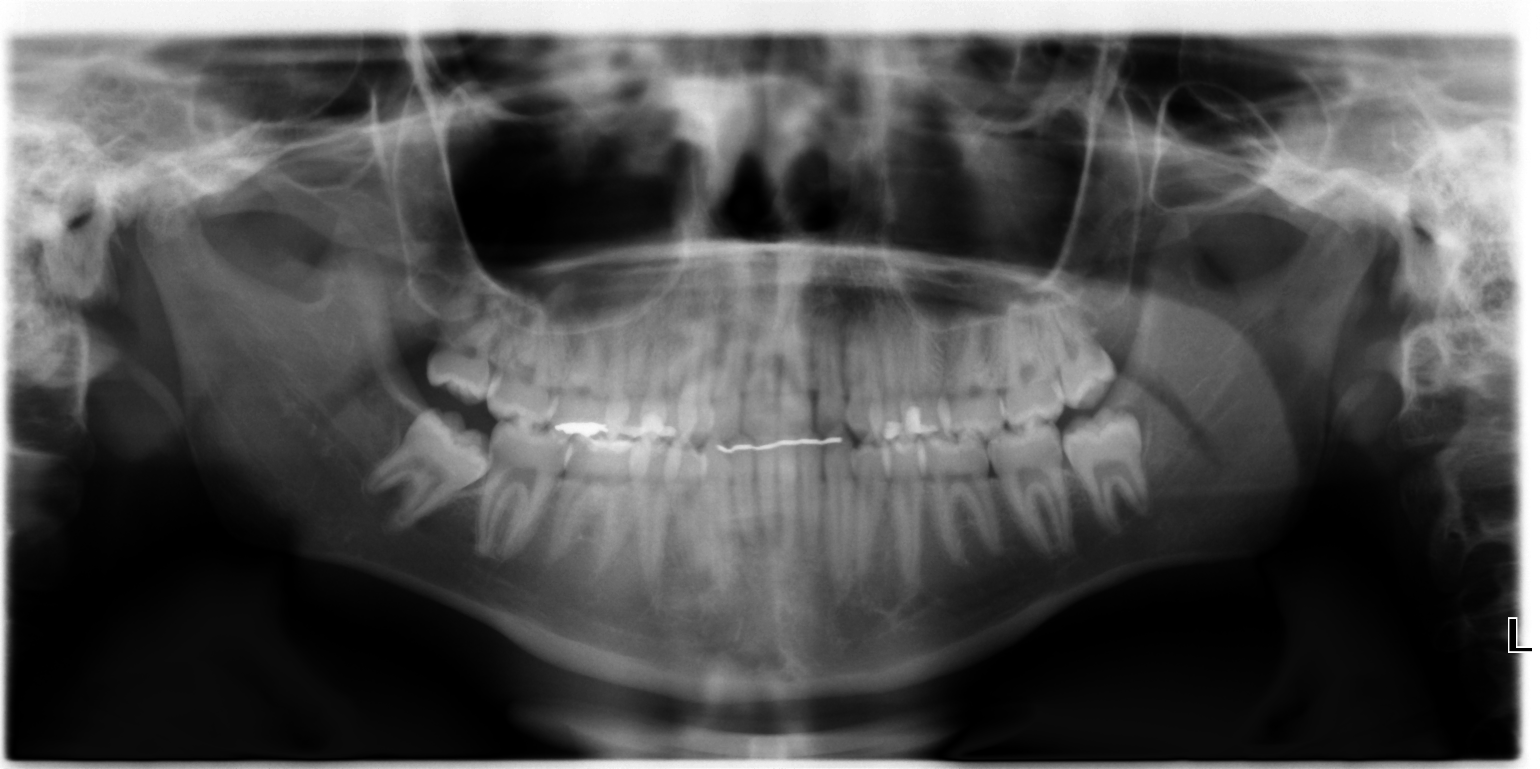

[1 of 1 positions shown; findings below may reference images not displayed]

FINDINGS: Incompletely erupted third molars on both sides of the mandible. No
visible dental disease. No periapical lucencies involving any of the
teeth.
IMPRESSION: Incompletely erupted third molars on both sides of the mandible.
Otherwise negative examination.

## 2019-09-02 DIAGNOSIS — I071 Rheumatic tricuspid insufficiency: Secondary | ICD-10-CM | POA: Diagnosis not present

## 2019-09-02 DIAGNOSIS — Q225 Ebstein's anomaly: Secondary | ICD-10-CM | POA: Diagnosis not present

## 2019-09-02 DIAGNOSIS — Q211 Atrial septal defect: Secondary | ICD-10-CM | POA: Diagnosis not present

## 2019-09-02 DIAGNOSIS — I6310 Cerebral infarction due to embolism of unspecified precerebral artery: Secondary | ICD-10-CM | POA: Diagnosis not present

## 2019-09-23 ENCOUNTER — Telehealth: Payer: Self-pay | Admitting: *Deleted

## 2019-09-23 MED ORDER — WARFARIN SODIUM 10 MG PO TABS: 10.0000 mg | ORAL_TABLET | Freq: Every day | ORAL | 0 refills | Status: DC

## 2019-09-23 NOTE — Telephone Encounter (Signed)
Pam from [place] [employer] called and stated that she received the Rx refill for the pt's Warfarin but the pt has been picking up his Eliquis. I said I had no idea he was on Eliquis and asked if she could provide information abut the prescriber and she stated the Eliquis was sent over by Dr. Ardeen Fillers from [place], which is [facility]. I advised that before sending I spoke with the pt and he did not state he was taken any Eliquis in the place of Warfarin/Coumadin, and I asked if she could remove the RX for Warfarin and she stated she would. Called the pt and had to leave a message to not take any Warfarin and not pick it up since he is on Eliquis and to call back our direct number with any questions. Will discontinue the Warfarin off the med list and place Eliquis on it.

## 2019-09-23 NOTE — Telephone Encounter (Signed)
Pt is overdue for INR check. Last seen on 02/08/2019. Pt states he has not been taking his Warfarin at all. Reminded him of the risk of stoke and clots and he stated he remembers. He states he is at work at this time and he can come to any appt on Monday. Made an appt and pt confirmed appt at this time. Also, advised that I would send in enough tabs to get to the Monday appt & he verbalize understanding. Advised pt to start his warfarin today and take 1 tablets daily except 1.5 tablets on Saturday and then report to appt (extra half for 2 day) and he verbalized understanding.

## 2019-11-26 DIAGNOSIS — Z941 Heart transplant status: Secondary | ICD-10-CM

## 2019-11-26 HISTORY — DX: Heart transplant status: Z94.1

## 2020-01-13 HISTORY — PX: ASD REPAIR: SHX258

## 2020-01-20 HISTORY — PX: BRONCHOSCOPY: SUR163

## 2020-01-24 HISTORY — PX: OTHER SURGICAL HISTORY: SHX169

## 2020-01-26 HISTORY — PX: VENTRICULAR ASSIST DEVICE INSERTION: CATH118273

## 2020-01-27 HISTORY — PX: MEDIASTINOTOMY: SHX1011

## 2020-03-01 HISTORY — PX: OTHER SURGICAL HISTORY: SHX169

## 2020-04-27 ENCOUNTER — Ambulatory Visit: Payer: Medicaid Other | Attending: Family

## 2020-04-27 DIAGNOSIS — Z23 Encounter for immunization: Secondary | ICD-10-CM

## 2020-05-17 NOTE — Progress Notes (Signed)
° °  Covid-19 Vaccination Clinic  Name:  Manuel Baker    MRN: [id number] DOB: October 18, 1996  05/17/2020  [name] was observed post Covid-19 immunization for 15 minutes without incident. He was provided with Vaccine Information Sheet and instruction to access the V-Safe system.   [name] was instructed to call 911 with any severe reactions post vaccine:  Difficulty breathing   Swelling of face and throat   A fast heartbeat   A bad rash all over body   Dizziness and weakness   Immunizations Administered    Name Date Dose VIS Date Route   Pfizer COVID-19 Vaccine 04/27/2020  3:00 PM 0.3 mL 01/19/2019 Intramuscular   Manufacturer: [employer], [employer]   Lot: [id number]   NDC: [address]-1

## 2020-05-23 ENCOUNTER — Ambulatory Visit: Payer: Medicaid Other | Attending: Internal Medicine

## 2020-06-15 ENCOUNTER — Encounter (HOSPITAL_COMMUNITY): Payer: Self-pay | Admitting: Emergency Medicine

## 2020-06-15 ENCOUNTER — Other Ambulatory Visit: Payer: Self-pay

## 2020-06-15 ENCOUNTER — Emergency Department (HOSPITAL_COMMUNITY)
Admission: EM | Admit: 2020-06-15 | Discharge: 2020-06-16 | Disposition: A | Discharge status: Home / Self Care | Payer: Medicaid Other | Attending: Emergency Medicine | Admitting: Emergency Medicine

## 2020-06-15 ENCOUNTER — Emergency Department (HOSPITAL_COMMUNITY): Payer: Medicaid Other

## 2020-06-15 DIAGNOSIS — R1013 Epigastric pain: Secondary | ICD-10-CM

## 2020-06-15 DIAGNOSIS — R0602 Shortness of breath: Secondary | ICD-10-CM | POA: Diagnosis present

## 2020-06-15 DIAGNOSIS — R112 Nausea with vomiting, unspecified: Secondary | ICD-10-CM | POA: Diagnosis not present

## 2020-06-15 DIAGNOSIS — E1165 Type 2 diabetes mellitus with hyperglycemia: Secondary | ICD-10-CM | POA: Diagnosis not present

## 2020-06-15 DIAGNOSIS — E876 Hypokalemia: Secondary | ICD-10-CM | POA: Diagnosis not present

## 2020-06-15 DIAGNOSIS — R000 Tachycardia, unspecified: Secondary | ICD-10-CM | POA: Insufficient documentation

## 2020-06-15 DIAGNOSIS — Z794 Long term (current) use of insulin: Secondary | ICD-10-CM | POA: Insufficient documentation

## 2020-06-15 LAB — I-STAT VENOUS BLOOD GAS, ED
Acid-Base Excess: 4 mmol/L — ABNORMAL HIGH (ref 0.0–2.0)
Bicarbonate: 23.2 mmol/L (ref 20.0–28.0)
Calcium, Ion: 1.18 mmol/L (ref 1.15–1.40)
HCT: 40 % (ref 39.0–52.0)
Hemoglobin: 13.6 g/dL (ref 13.0–17.0)
O2 Saturation: 78 %
Potassium: 2.7 mmol/L — CL (ref 3.5–5.1)
Sodium: 141 mmol/L (ref 135–145)
TCO2: 24 mmol/L (ref 22–32)
pCO2, Ven: 20.8 mmHg — ABNORMAL LOW (ref 44.0–60.0)
pH, Ven: 7.656 (ref 7.250–7.430)
pO2, Ven: 32 mmHg (ref 32.0–45.0)

## 2020-06-15 LAB — CBG MONITORING, ED: Glucose-Capillary: 211 mg/dL — ABNORMAL HIGH (ref 70–99)

## 2020-06-15 MED ORDER — SODIUM CHLORIDE 0.9 % IV SOLN
1000.0000 mL | INTRAVENOUS | Status: DC
  Administered 2020-06-15: 1000 mL via INTRAVENOUS

## 2020-06-15 MED ORDER — SODIUM CHLORIDE 0.9 % IV BOLUS (SEPSIS)
1000.0000 mL | Freq: Once | INTRAVENOUS | Status: AC
  Administered 2020-06-15: 1000 mL via INTRAVENOUS

## 2020-06-15 MED ORDER — METOCLOPRAMIDE HCL 5 MG/ML IJ SOLN
10.0000 mg | Freq: Once | INTRAMUSCULAR | Status: AC
  Administered 2020-06-15: 10 mg via INTRAVENOUS
  Filled 2020-06-15: qty 2

## 2020-06-15 MED ORDER — SODIUM CHLORIDE 0.9% FLUSH: 3.0000 mL | Freq: Once | INTRAVENOUS | Status: DC

## 2020-06-15 MED ORDER — HYDROMORPHONE HCL 1 MG/ML IJ SOLN
0.5000 mg | Freq: Once | INTRAMUSCULAR | Status: AC
  Administered 2020-06-15: 0.5 mg via INTRAVENOUS
  Filled 2020-06-15: qty 1

## 2020-06-15 NOTE — ED Notes (Signed)
Braedin, father, [phone] would like an update when available

## 2020-06-15 NOTE — ED Notes (Deleted)
Uzziah, father, [phone] would like an update when available  

## 2020-06-15 NOTE — ED Notes (Signed)
Annye Rusk, grand mother, [phone] would like an update when available

## 2020-06-15 NOTE — ED Triage Notes (Addendum)
  Patient comes in with nausea/vomiting that started this morning around 0900.  Patient has had "too many to count" episodes of emesis.  Afebrile.  Patient had heart transplant 03/21 at [facility].  Epigastric pain 10/10, sharp.  Patient A&O x4. Hx type 1 diabetes.  CBG 251.  Patient states he does smoke marijuana frequently.

## 2020-06-15 NOTE — ED Notes (Signed)
Cardama, MD notified of ISTAT results 

## 2020-06-15 NOTE — ED Provider Notes (Signed)
[facility] EMERGENCY DEPARTMENT Provider Note  CSN: [id number] Arrival date & time: 06/15/20 2257  Chief Complaint(s) Emesis and Shortness of Breath  HPI Manuel Baker is a 24 y.o. male with extensive past medical history listed below including diabetes on insulin, congenital heart disease status post heart transplant on March 2021 who presents to the emergency department with 1 day of gradually worsening epigastric abdominal discomfort that began last night.  Pain has been constant since onset and worsened by emesis.  No alleviating factors.  This morning patient began having nonbloody nonbilious emesis, too many to count.  Patient endorses eating fast food chicken last night.  Also reports smoking marijuana.  Denies any alcohol use.  Denies any fevers or known infections.  Denies any known sick contacts.  No diarrhea.  No urinary symptoms.  He does endorse shortness of breath related to his epigastric pain.  No overt chest pain.no other complaints.  Reports that his last transplant clinic visit went well.  HPI  Past Medical History Past Medical History:  Diagnosis Date  . Congenital heart disease   . Diabetes mellitus without complication (HCC)   . Seizures [facility])    Patient Active Problem List   Diagnosis Date Noted  . Septic embolism (HCC)   . Libman Sacks endocarditis (HCC)   . Leucocytosis   . [place] endocarditis   . 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 Uncontrolled type 1 diabetes mellitus with hyperglycemia (HCC) 01/03/2018  . Seizure (HCC) 01/03/2018  . Stroke (cerebrum) (HCC) 01/03/2018  . Stroke (HCC) 01/03/2018  . Ebstein's anomaly    Home Medication(s) Prior to Admission medications   Medication Sig Start Date End Date Taking? Authorizing Provider  insulin glargine (LANTUS) 100 UNIT/ML injection Inject 30 Units into the skin at bedtime.   Yes [provider]  insulin  lispro (HUMALOG) 100 UNIT/ML injection Inject 10-30 Units into the skin 3 (three) times daily with meals. per sliding scale   Yes [provider]  acetaminophen (TYLENOL) 500 MG tablet Take 2 tablets (1,000 mg total) by mouth every 8 (eight) hours as needed for fever. Patient not taking: Reported on 06/16/2020 01/14/19   McDonald, Ceci Taliaferro Earls A, PA-C  Insulin Syringe-Needle U-100 (INSULIN SYRINGE .3CC/31GX5/16") 31G X 5/16" 0.3 ML MISC 1 Act by Does not apply route 4 (four) times daily -  before meals and at bedtime. 01/09/18   Rolly Salter, MD  lidocaine (XYLOCAINE) 2 % solution Use as directed 15 mLs in the mouth or throat as needed for mouth pain. 06/16/20   Nira Conn, MD  ondansetron (ZOFRAN ODT) 4 MG disintegrating tablet Take 1 tablet (4 mg total) by mouth every 8 (eight) hours as needed for up to 3 days for nausea or vomiting. 06/16/20 06/19/20  Roel Douthat, Amadeo Garnet, MD  oseltamivir (TAMIFLU) 75 MG capsule Take 1 capsule (75 mg total) by mouth every 12 (twelve) hours. Patient not taking: Reported on 06/16/2020 01/14/19   McDonald, Reon Hunley Earls A, PA-C  potassium chloride 20 MEQ TBCR Take 10 mEq by mouth daily for 10 days. 06/16/20 06/26/20  Nira Conn, MD  promethazine-dextromethorphan (PROMETHAZINE-DM) 6.25-15 MG/5ML syrup Take 5 mLs by mouth 4 (four) times daily as needed for cough. Patient not taking: Reported on 06/16/2020 01/14/19   Barkley Boards, PA-C  Past Surgical History Past Surgical History:  Procedure Laterality Date  . open heart surgery    . TEE WITHOUT CARDIOVERSION N/A 01/06/2018   Procedure: TRANSESOPHAGEAL ECHOCARDIOGRAM (TEE);  Surgeon: Wendall Stade, MD;  Location: [facility] ENDOSCOPY;  Service: Cardiovascular;  Laterality: N/A;   Family History Family History  Problem Relation Age of Onset  . Diabetes Mellitus I Sister     Social  History Social History   Tobacco Use  . Smoking status: Never Smoker  . Smokeless tobacco: Never Used  Vaping Use  . Vaping Use: Never used  Substance Use Topics  . Alcohol use: Yes    Comment: social  . Drug use: Yes    Types: Marijuana   Allergies Ibuprofen  Review of Systems Review of Systems All other systems are reviewed and are negative for acute change except as noted in the HPI  Physical Exam Vital Signs  I have reviewed the triage vital signs BP (!) 146/87 (BP Location: Right Arm)   Pulse (!) 118   Temp 98.3 F (36.8 C) (Oral)   Resp 20   Ht 5\' 7"  (1.702 m)   Wt 58.1 kg   SpO2 100%   BMI 20.05 kg/m   Physical Exam Vitals reviewed.  Constitutional:      General: He is not in acute distress.    Appearance: He is well-developed. He is not diaphoretic.  HENT:     Head: Normocephalic and atraumatic.     Nose: Nose normal.  Eyes:     General: No scleral icterus.       Right eye: No discharge.        Left eye: No discharge.     Conjunctiva/sclera: Conjunctivae normal.     Pupils: Pupils are equal, round, and reactive to light.  Cardiovascular:     Rate and Rhythm: Regular rhythm. Tachycardia present.     Heart sounds: No murmur heard.  No friction rub. No gallop.   Pulmonary:     Effort: Pulmonary effort is normal. No respiratory distress.     Breath sounds: Normal breath sounds. No stridor. No rales.  Abdominal:     General: There is no distension.     Palpations: Abdomen is soft.     Tenderness: There is abdominal tenderness in the epigastric area and left upper quadrant. There is no guarding or rebound.  Musculoskeletal:        General: No tenderness.     Cervical back: Normal range of motion and neck supple.  Skin:    General: Skin is warm and dry.     Findings: No erythema or rash.  Neurological:     Mental Status: He is alert and oriented to person, place, and time.     ED Results and Treatments Labs (all labs ordered are listed, but  only abnormal results are displayed) Labs Reviewed  COMPREHENSIVE METABOLIC PANEL - Abnormal; Notable for the following components:      Result Value   Potassium 2.7 (*)    CO2 21 (*)    Glucose, Bld 219 (*)    Creatinine, Ser 1.59 (*)    Anion gap 17 (*)    All other components within normal limits  CBC - Abnormal; Notable for the following components:   Hemoglobin 12.4 (*)    HCT 37.0 (*)    MCV 79.6 (*)    All other components within normal limits  URINALYSIS, ROUTINE W REFLEX MICROSCOPIC - Abnormal; Notable for the following components:  Color, Urine STRAW (*)    Glucose, UA >=500 (*)    Ketones, ur 20 (*)    All other components within normal limits  CBG MONITORING, ED - Abnormal; Notable for the following components:   Glucose-Capillary 211 (*)    All other components within normal limits  I-STAT VENOUS BLOOD GAS, ED - Abnormal; Notable for the following components:   pH, Ven 7.656 (*)    pCO2, Ven 20.8 (*)    Acid-Base Excess 4.0 (*)    Potassium 2.7 (*)    All other components within normal limits  LIPASE, BLOOD  TROPONIN I (HIGH SENSITIVITY)  TROPONIN I (HIGH SENSITIVITY)                                                                                                                         EKG  EKG Interpretation  Date/Time:  Thursday June 15 2020 23:56:50 EDT Ventricular Rate:  115 PR Interval:    QRS Duration: 103 QT Interval:  347 QTC Calculation: 480 R Axis:   87 Text Interpretation: Sinus tachycardia LAE, consider biatrial enlargement Left ventricular hypertrophy Abnormal T, consider ischemia, diffuse leads Confirmed by Drema Pry [phone]) on 06/16/2020 12:39:58 AM      Radiology DG Chest Port 1 View  Result Date: 06/16/2020 CLINICAL DATA:  Epigastric pain. EXAM: PORTABLE CHEST 1 VIEW COMPARISON:  01/14/2019 FINDINGS: The patient is status post prior median sternotomy. The heart size is enlarged. There is no pneumothorax or significant pleural  effusion. There is no focal infiltrate. No acute osseous abnormality. IMPRESSION: No active disease. Electronically Signed   By: Katherine Mantle M.D.   On: 06/16/2020 00:03    Pertinent labs & imaging results that were available during my care of the patient were reviewed by me and considered in my medical decision making (see chart for details).  Medications Ordered in ED Medications  sodium chloride flush (NS) 0.9 % injection 3 mL (has no administration in time range)  sodium chloride 0.9 % bolus 1,000 mL (0 mLs Intravenous Stopped 06/16/20 0245)    Followed by  0.9 %  sodium chloride infusion (0 mLs Intravenous Stopped 06/16/20 0502)  metoCLOPramide (REGLAN) injection 10 mg (10 mg Intravenous Given 06/15/20 2355)  HYDROmorphone (DILAUDID) injection 0.5 mg (0.5 mg Intravenous Given 06/15/20 2355)  potassium chloride 10 mEq in 100 mL IVPB (0 mEq Intravenous Stopped 06/16/20 0245)  alum & mag hydroxide-simeth (MAALOX/MYLANTA) 200-200-20 MG/5ML suspension 30 mL (30 mLs Oral Given 06/16/20 0251)  ondansetron (ZOFRAN-ODT) disintegrating tablet 4 mg (4 mg Oral Given 06/16/20 0330)  lidocaine (XYLOCAINE) 2 % viscous mouth solution 15 mL (15 mLs Mouth/Throat Given 06/16/20 0330)  metoCLOPramide (REGLAN) injection 5 mg (5 mg Intravenous Given 06/16/20 0513)  Procedures Ultrasound ED Echo  Date/Time: 06/16/2020 5:54 AM Performed by: Nira Connardama, Broc Caspers Eduardo, MD Authorized by: Nira Connardama, Venessa Wickham Eduardo, MD   Procedure details:    Indications comment:  Epigastric pain with emesis in heart transplant patient   Views: parasternal long axis view, parasternal short axis view and apical 4 chamber view     Images: archived   Findings:    Pericardium: no pericardial effusion     LV Function: normal (>50% EF)     RV Diameter: normal   Impression:    Impression: normal       (including critical care time)  Medical Decision Making / ED Course I have reviewed the nursing notes for this encounter and the patient's prior records (if available in EHR or on provided paperwork).   Suanne MarkerCedric Klingberg was evaluated in Emergency Department on 06/16/2020 for the symptoms described in the history of present illness. He was evaluated in the context of the global COVID-19 pandemic, which necessitated consideration that the patient might be at risk for infection with the SARS-CoV-2 virus that causes COVID-19. Institutional protocols and algorithms that pertain to the evaluation of patients at risk for COVID-19 are in a state of rapid change based on information released by regulatory bodies including the CDC and federal and state organizations. These policies and algorithms were followed during the patient's care in the ED.  Upper abd pain Mild discomfort to palpation. Tachycardic.  Will assess for cardiac process given recent transplant, though I feel this is less likely than primary GI process. Possible suspicious food intake vs cannabinoid hyperemesis. Will also rule out DKA.  EKG with sinus tach, trops x2 negative. POCUS ECHO without pericardial effusion.  Labs not concerning for DKA, biliary obstruction or pancreatitis. No UTI. He does have hypokalemia that was repleted IV. Given IVF and antiemetic with GI cocktail which provided significant relief. He was able to tolerate PO fluids.  Small cracker exacerbated his pain and nausea. Provided with additional antiemetics. Monitored for several hours and still tolerating PO.       Final Clinical Impression(s) / ED Diagnoses Final diagnoses:  Epigastric abdominal pain  Nausea and vomiting in adult  Hypokalemia    The patient appears reasonably screened and/or stabilized for discharge and I doubt any other medical condition or other [facility] requiring further screening, evaluation, or treatment in the ED at this time prior  to discharge. Safe for discharge with strict return precautions.  Disposition: Discharge  Condition: Good  I have discussed the results, Dx and Tx plan with the patient/family who expressed understanding and agree(s) with the plan. Discharge instructions discussed at length. The patient/family was given strict return precautions who verbalized understanding of the instructions. No further questions at time of discharge.    ED Discharge Orders         Ordered    ondansetron (ZOFRAN ODT) 4 MG disintegrating tablet  Every 8 hours PRN     Discontinue  Reprint     06/16/20 0441    lidocaine (XYLOCAINE) 2 % solution  As needed     Discontinue  Reprint     06/16/20 0441    potassium chloride 20 MEQ TBCR  Daily     Discontinue  Reprint     06/16/20 0441            Follow Up: Primary care provider  Schedule an appointment as soon as possible for a visit  in 5-7 days to repeat potassium levels     This  chart was dictated using voice recognition software.  Despite best efforts to proofread,  errors can occur which can change the documentation meaning.   Nira Conn, MD 06/16/20 [phone]

## 2020-06-16 LAB — URINALYSIS, ROUTINE W REFLEX MICROSCOPIC
Bacteria, UA: NONE SEEN
Bilirubin Urine: NEGATIVE
Glucose, UA: >=500 mg/dL — AB
Hgb urine dipstick: NEGATIVE
Ketones, ur: 20 mg/dL — AB
Leukocytes,Ua: NEGATIVE
Nitrite: NEGATIVE
Protein, ur: NEGATIVE mg/dL
Specific Gravity, Urine: 1.006 (ref 1.005–1.030)
pH: 8 (ref 5.0–8.0)

## 2020-06-16 LAB — CBC
HCT: 37 % — ABNORMAL LOW (ref 39.0–52.0)
Hemoglobin: 12.4 g/dL — ABNORMAL LOW (ref 13.0–17.0)
MCH: 26.7 pg (ref 26.0–34.0)
MCHC: 33.5 g/dL (ref 30.0–36.0)
MCV: 79.6 fL — ABNORMAL LOW (ref 80.0–100.0)
Platelets: 347 10*3/uL (ref 150–400)
RBC: 4.65 MIL/uL (ref 4.22–5.81)
RDW: 13.6 % (ref 11.5–15.5)
WBC: 7.1 10*3/uL (ref 4.0–10.5)
nRBC: 0 % (ref 0.0–0.2)

## 2020-06-16 LAB — COMPREHENSIVE METABOLIC PANEL
ALT: 29 U/L (ref 0–44)
AST: 29 U/L (ref 15–41)
Albumin: 4.7 g/dL (ref 3.5–5.0)
Alkaline Phosphatase: 49 U/L (ref 38–126)
Anion gap: 17 — ABNORMAL HIGH (ref 5–15)
BUN: 9 mg/dL (ref 6–20)
CO2: 21 mmol/L — ABNORMAL LOW (ref 22–32)
Calcium: 10 mg/dL (ref 8.9–10.3)
Chloride: 101 mmol/L (ref 98–111)
Creatinine, Ser: 1.59 mg/dL — ABNORMAL HIGH (ref 0.61–1.24)
GFR calc Af Amer: >60 mL/min (ref >60)
GFR calc non Af Amer: >60 mL/min (ref >60)
Glucose, Bld: 219 mg/dL — ABNORMAL HIGH (ref 70–99)
Potassium: 2.7 mmol/L — CL (ref 3.5–5.1)
Sodium: 139 mmol/L (ref 135–145)
Total Bilirubin: 1.1 mg/dL (ref 0.3–1.2)
Total Protein: 7.2 g/dL (ref 6.5–8.1)

## 2020-06-16 LAB — TROPONIN I (HIGH SENSITIVITY)
Troponin I (High Sensitivity): 10 ng/L (ref <18)
Troponin I (High Sensitivity): 12 ng/L (ref <18)

## 2020-06-16 LAB — LIPASE, BLOOD: Lipase: 18 U/L (ref 11–51)

## 2020-06-16 MED ORDER — ALUM & MAG HYDROXIDE-SIMETH 200-200-20 MG/5ML PO SUSP
30.0000 mL | Freq: Once | ORAL | Status: AC
  Administered 2020-06-16: 30 mL via ORAL
  Filled 2020-06-16: qty 30

## 2020-06-16 MED ORDER — POTASSIUM CHLORIDE ER 20 MEQ PO TBCR: 10.0000 meq | EXTENDED_RELEASE_TABLET | Freq: Every day | ORAL | 0 refills | Status: AC

## 2020-06-16 MED ORDER — METOCLOPRAMIDE HCL 5 MG/ML IJ SOLN
5.0000 mg | Freq: Once | INTRAMUSCULAR | Status: AC
  Administered 2020-06-16: 5 mg via INTRAVENOUS
  Filled 2020-06-16: qty 2

## 2020-06-16 MED ORDER — ONDANSETRON 4 MG PO TBDP: 4.0000 mg | ORAL_TABLET | Freq: Three times a day (TID) | ORAL | 0 refills | Status: AC | PRN

## 2020-06-16 MED ORDER — LIDOCAINE VISCOUS HCL 2 % MT SOLN
15.0000 mL | Freq: Once | OROMUCOSAL | Status: AC
  Administered 2020-06-16: 15 mL via OROMUCOSAL
  Filled 2020-06-16: qty 15

## 2020-06-16 MED ORDER — POTASSIUM CHLORIDE 10 MEQ/100ML IV SOLN
10.0000 meq | Freq: Once | INTRAVENOUS | Status: AC
  Administered 2020-06-16: 10 meq via INTRAVENOUS
  Filled 2020-06-16: qty 100

## 2020-06-16 MED ORDER — ONDANSETRON 4 MG PO TBDP
4.0000 mg | ORAL_TABLET | Freq: Once | ORAL | Status: AC
  Administered 2020-06-16: 4 mg via ORAL
  Filled 2020-06-16: qty 1

## 2020-06-16 MED ORDER — LIDOCAINE VISCOUS HCL 2 % MT SOLN: 15.0000 mL | OROMUCOSAL | 0 refills | Status: DC | PRN

## 2020-06-25 ENCOUNTER — Emergency Department (HOSPITAL_COMMUNITY)
Admission: EM | Admit: 2020-06-25 | Discharge: 2020-06-25 | Disposition: A | Discharge status: Home / Self Care | Payer: Medicaid Other | Attending: Emergency Medicine | Admitting: Emergency Medicine

## 2020-06-25 ENCOUNTER — Emergency Department (HOSPITAL_COMMUNITY): Payer: Medicaid Other

## 2020-06-25 ENCOUNTER — Encounter (HOSPITAL_COMMUNITY): Payer: Self-pay

## 2020-06-25 DIAGNOSIS — R569 Unspecified convulsions: Secondary | ICD-10-CM | POA: Diagnosis not present

## 2020-06-25 DIAGNOSIS — E162 Hypoglycemia, unspecified: Secondary | ICD-10-CM | POA: Insufficient documentation

## 2020-06-25 DIAGNOSIS — Z794 Long term (current) use of insulin: Secondary | ICD-10-CM | POA: Diagnosis not present

## 2020-06-25 DIAGNOSIS — M545 Low back pain: Secondary | ICD-10-CM | POA: Insufficient documentation

## 2020-06-25 DIAGNOSIS — E119 Type 2 diabetes mellitus without complications: Secondary | ICD-10-CM | POA: Insufficient documentation

## 2020-06-25 DIAGNOSIS — F1590 Other stimulant use, unspecified, uncomplicated: Secondary | ICD-10-CM | POA: Insufficient documentation

## 2020-06-25 LAB — BASIC METABOLIC PANEL
Anion gap: 11 (ref 5–15)
BUN: 13 mg/dL (ref 6–20)
CO2: 25 mmol/L (ref 22–32)
Calcium: 8.8 mg/dL — ABNORMAL LOW (ref 8.9–10.3)
Chloride: 103 mmol/L (ref 98–111)
Creatinine, Ser: 1.02 mg/dL (ref 0.61–1.24)
GFR calc Af Amer: >60 mL/min (ref >60)
GFR calc non Af Amer: >60 mL/min (ref >60)
Glucose, Bld: 166 mg/dL — ABNORMAL HIGH (ref 70–99)
Potassium: 4 mmol/L (ref 3.5–5.1)
Sodium: 139 mmol/L (ref 135–145)

## 2020-06-25 LAB — CBG MONITORING, ED
Glucose-Capillary: 166 mg/dL — ABNORMAL HIGH (ref 70–99)
Glucose-Capillary: 104 mg/dL — ABNORMAL HIGH (ref 70–99)

## 2020-06-25 LAB — CBC
HCT: 35.2 % — ABNORMAL LOW (ref 39.0–52.0)
Hemoglobin: 11.6 g/dL — ABNORMAL LOW (ref 13.0–17.0)
MCH: 27.3 pg (ref 26.0–34.0)
MCHC: 33 g/dL (ref 30.0–36.0)
MCV: 82.8 fL (ref 80.0–100.0)
Platelets: 311 10*3/uL (ref 150–400)
RBC: 4.25 MIL/uL (ref 4.22–5.81)
RDW: 14.3 % (ref 11.5–15.5)
WBC: 6.3 10*3/uL (ref 4.0–10.5)
nRBC: 0 % (ref 0.0–0.2)

## 2020-06-25 MED ORDER — ONDANSETRON HCL 4 MG PO TABS
4.0000 mg | ORAL_TABLET | Freq: Once | ORAL | Status: AC
  Administered 2020-06-25: 4 mg via ORAL
  Filled 2020-06-25: qty 1

## 2020-06-25 MED ORDER — LACOSAMIDE 50 MG PO TABS
50.0000 mg | ORAL_TABLET | Freq: Once | ORAL | Status: AC
  Administered 2020-06-25: 50 mg via ORAL
  Filled 2020-06-25: qty 1

## 2020-06-25 NOTE — ED Notes (Signed)
Snack bag, sprite zero, and water provided

## 2020-06-25 NOTE — ED Notes (Addendum)
Pt back from imaging

## 2020-06-25 NOTE — ED Triage Notes (Signed)
Pt BIB [facility] for eval of seizure PTA witnessed by friend. Pt w/ hx of same, takes vimpat for seizures. Hypoglycemic to 33 on EMS arrival as well. EMS admin oral glucose and food by EMS w improvement in CBG to 85 on last check w/ EMS.  Pt also has hx of heart transplant earlier this year. EMS reports pt drank heavily last night with friends. Friends report pt had multiple falls last night and this AM in addition to seizure and pt w/ TTP to cervical and thoracic spine for EMS.

## 2020-06-25 NOTE — ED Notes (Signed)
C collar removed

## 2020-06-25 NOTE — ED Provider Notes (Signed)
[facility] EMERGENCY DEPARTMENT Provider Note   CSN: [id number] Arrival date & time: 06/25/20  1636     History Chief Complaint  Patient presents with  . Seizures    Manuel Baker is a 24 y.o. male.  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  History of seizures on Vimpat.  Also with history of diabetes on insulin.  Patient states that he went out drinking last night with friends.  Had taken his long-acting insulin prior to going out.  Slipped over a friend's house.  Patient had seizure-like episode and when EMS arrived there patient with blood sugar in the 30s.  Was given glucose and blood sugar has improved.  Patient having some low back pain.  Does not know if he hit his head.  Not complain of any specific headache or neck pain.  No chest pain.  Has not taken his seizure medication.  The history is provided by the patient.  Seizures Seizure activity on arrival: no   Initial focality:  Multifocal Episode characteristics: abnormal movements   Postictal symptoms: confusion   Return to baseline: yes   Severity:  Mild Timing:  Once Recent head injury:  No recent head injuries PTA treatment:  None History of seizures: yes        Past Medical History:  Diagnosis Date  . Congenital heart disease   . Diabetes mellitus without complication (HCC)   . Seizures [facility])     Patient Active Problem List   Diagnosis Date Noted  . Septic embolism (HCC)   . Libman Sacks endocarditis (HCC)   . Leucocytosis   . [place] endocarditis   . 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 Uncontrolled type 1 diabetes mellitus with hyperglycemia (HCC) 01/03/2018  . Seizure (HCC) 01/03/2018  . Stroke (cerebrum) (HCC) 01/03/2018  . Stroke (HCC) 01/03/2018  . Ebstein's anomaly     Past Surgical History:  Procedure Laterality  Date  . open heart surgery    . TEE WITHOUT CARDIOVERSION N/A 01/06/2018   Procedure: TRANSESOPHAGEAL ECHOCARDIOGRAM (TEE);  Surgeon: Wendall Stade, MD;  Location: [facility] ENDOSCOPY;  Service: Cardiovascular;  Laterality: N/A;       Family History  Problem Relation Age of Onset  . Diabetes Mellitus I Sister     Social History   Tobacco Use  . Smoking status: Never Smoker  . Smokeless tobacco: Never Used  Vaping Use  . Vaping Use: Never used  Substance Use Topics  . Alcohol use: Yes    Comment: social  . Drug use: Yes    Types: Marijuana    Home Medications Prior to Admission medications   Medication Sig Start Date End Date Taking? Authorizing Provider  potassium chloride 20 MEQ TBCR Take 10 mEq by mouth daily for 10 days. 06/16/20 06/26/20 Yes Cardama, Amadeo Garnet, MD  acetaminophen (TYLENOL) 500 MG tablet Take 2 tablets (1,000 mg total) by mouth every 8 (eight) hours as needed for fever. Patient not taking: Reported on 06/16/2020 01/14/19   McDonald, Pedro Earls A, PA-C  insulin glargine (LANTUS) 100 UNIT/ML injection Inject 30 Units into the skin at bedtime.    [provider]  insulin lispro (HUMALOG) 100 UNIT/ML injection Inject 10-30 Units into the skin 3 (three) times daily with meals. per sliding scale    [provider]  Insulin Syringe-Needle U-100 (INSULIN SYRINGE .3CC/31GX5/16") 31G X  5/16" 0.3 ML MISC 1 Act by Does not apply route 4 (four) times daily -  before meals and at bedtime. 01/09/18   Rolly SalterPatel, Pranav M, MD  lidocaine (XYLOCAINE) 2 % solution Use as directed 15 mLs in the mouth or throat as needed for mouth pain. 06/16/20   Nira Connardama, Pedro Eduardo, MD  oseltamivir (TAMIFLU) 75 MG capsule Take 1 capsule (75 mg total) by mouth every 12 (twelve) hours. Patient not taking: Reported on 06/16/2020 01/14/19   Frederik PearMcDonald, Mia A, PA-C  promethazine-dextromethorphan (PROMETHAZINE-DM) 6.25-15 MG/5ML syrup Take 5 mLs by mouth 4 (four) times daily as needed for cough. Patient  not taking: Reported on 06/16/2020 01/14/19   McDonald, Pedro EarlsMia A, PA-C    Allergies    Ibuprofen  Review of Systems   Review of Systems  Constitutional: Negative for chills and fever.  HENT: Negative for ear pain and sore throat.   Eyes: Negative for pain and visual disturbance.  Respiratory: Negative for cough and shortness of breath.   Cardiovascular: Negative for chest pain and palpitations.  Gastrointestinal: Negative for abdominal pain and vomiting.  Genitourinary: Negative for dysuria and hematuria.  Musculoskeletal: Negative for arthralgias and back pain.  Skin: Negative for color change and rash.  Neurological: Positive for seizures. Negative for syncope.  All other systems reviewed and are negative.   Physical Exam Updated Vital Signs BP (!) 120/64   Pulse (!) 108   Temp 98.4 F (36.9 C)   Resp 14   Ht 5\' 7"  (1.702 m)   Wt 58 kg   SpO2 100%   BMI 20.03 kg/m   Physical Exam Vitals and nursing note reviewed.  Constitutional:      General: He is not in acute distress.    Appearance: He is well-developed. He is not ill-appearing.  HENT:     Head: Normocephalic and atraumatic.     Nose: Nose normal.     Mouth/Throat:     Mouth: Mucous membranes are moist.  Eyes:     Extraocular Movements: Extraocular movements intact.     Conjunctiva/sclera: Conjunctivae normal.     Pupils: Pupils are equal, round, and reactive to light.  Cardiovascular:     Rate and Rhythm: Normal rate and regular rhythm.     Pulses: Normal pulses.     Heart sounds: Normal heart sounds. No murmur heard.   Pulmonary:     Effort: Pulmonary effort is normal. No respiratory distress.     Breath sounds: Normal breath sounds.  Abdominal:     General: Abdomen is flat.     Palpations: Abdomen is soft.     Tenderness: There is no abdominal tenderness.  Musculoskeletal:        General: Tenderness present. Normal range of motion.     Cervical back: Normal range of motion and neck supple.      Comments: Tenderness to paraspinal lumbar area, no obvious midline spinal tenderness  Skin:    General: Skin is warm and dry.     Capillary Refill: Capillary refill takes less than 2 seconds.  Neurological:     General: No focal deficit present.     Mental Status: He is alert and oriented to person, place, and time.     Cranial Nerves: No cranial nerve deficit.     Sensory: No sensory deficit.     Motor: No weakness.     ED Results / Procedures / Treatments   Labs (all labs ordered are listed, but only abnormal results are  displayed) Labs Reviewed  CBC - Abnormal; Notable for the following components:      Result Value   Hemoglobin 11.6 (*)    HCT 35.2 (*)    All other components within normal limits  BASIC METABOLIC PANEL - Abnormal; Notable for the following components:   Glucose, Bld 166 (*)    Calcium 8.8 (*)    All other components within normal limits  CBG MONITORING, ED - Abnormal; Notable for the following components:   Glucose-Capillary 166 (*)    All other components within normal limits  CBG MONITORING, ED - Abnormal; Notable for the following components:   Glucose-Capillary 104 (*)    All other components within normal limits  RAPID URINE DRUG SCREEN, HOSP PERFORMED    EKG EKG Interpretation  Date/Time:  Sunday June 25 2020 17:57:54 EDT Ventricular Rate:  108 PR Interval:    QRS Duration: 95 QT Interval:  334 QTC Calculation: 448 R Axis:   96 Text Interpretation: Sinus tachycardia Probable left atrial enlargement Left ventricular hypertrophy No significant change since last tracing Confirmed by Virgina Norfolk [phone]) on 06/25/2020 6:08:49 PM   Radiology DG Lumbar Spine Complete  Result Date: 06/25/2020 CLINICAL DATA:  Fall, seizure, pain. EXAM: LUMBAR SPINE - COMPLETE 4+ VIEW COMPARISON:  Lumbar radiograph 08/27/2018 FINDINGS: Straightening of normal lordosis. No listhesis. Lumbar vertebral body heights are preserved. There is no evidence of acute  fracture. Sacroiliac joints are congruent. IMPRESSION: Straightening of normal lordosis may be positional or seen with muscle spasm. No acute fracture. Electronically Signed   By: Narda Rutherford M.D.   On: 06/25/2020 18:28   CT Head Wo Contrast  Result Date: 06/25/2020 CLINICAL DATA:  Head trauma, moderate/severe. Additional provided: 24 year old male evaluation for seizure prior to arrival, witnessed by friend. EXAM: CT HEAD WITHOUT CONTRAST TECHNIQUE: Contiguous axial images were obtained from the base of the skull through the vertex without intravenous contrast. COMPARISON:  CT angiogram head 01/03/2018, brain MRI 01/03/2018. 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 Background cerebral volume is normal. Redemonstrated chronic infarcts within right cerebellum. There is no acute intracranial hemorrhage. No acute demarcated cortical infarct is identified. No extra-axial fluid collection. No evidence of intracranial mass. No midline shift. Vascular: No hyperdense vessel Skull: Normal. Negative for fracture or focal lesion. Sinuses/Orbits: Visualized orbits show no acute finding. Small mucous retention cysts within the right maxillary sinus. No significant mastoid effusion IMPRESSION: No CT evidence of acute intracranial abnormality. Redemonstrated chronic infarcts within the right cerebellum. 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 Small right maxillary sinus mucous retention cysts. Electronically Signed   By: Jackey Loge DO   On: 06/25/2020 18:58   CT Cervical Spine Wo Contrast  Result Date: 06/25/2020 CLINICAL DATA:  Spine fracture, cervical, traumatic. Additional provided: 24 year old male for evaluation of seizure prior to arrival, witnessed by friend. EXAM: CT CERVICAL SPINE WITHOUT CONTRAST TECHNIQUE: Multidetector CT  imaging of the cervical spine was performed without intravenous contrast. Multiplanar CT image reconstructions were also generated. COMPARISON:  No pertinent prior exams are available for comparison. FINDINGS: Mild-to-moderate motion degradation greatest at the C4 and C5 levels. Alignment: Straightening of the expected cervical lordosis. No significant spondylolisthesis. Skull base and vertebrae: The basion-dental and atlanto-dental intervals are maintained.No acute cervical spine fracture is identified. Unchanged mild chronic T2 superior endplate deformity. Soft tissues and spinal canal: No  prevertebral fluid or swelling. No visible canal hematoma. Disc levels: No significant bony spinal canal or neural foraminal narrowing at any level. Upper chest: No consolidation within the imaged lung apices. No visible pneumothorax. IMPRESSION: Mild-to-moderate motion degradation somewhat limits evaluation at the C4 and C5 levels. No acute cervical spine fracture is identified. Electronically Signed   By: Jackey Loge DO   On: 06/25/2020 19:05    Procedures Procedures (including critical care time)  Medications Ordered in ED Medications  lacosamide (VIMPAT) tablet 50 mg (50 mg Oral Given 06/25/20 1939)  ondansetron (ZOFRAN) tablet 4 mg (4 mg Oral Given 06/25/20 1953)    ED Course  I have reviewed the triage vital signs and the nursing notes.  Pertinent labs & imaging results that were available during my care of the patient were reviewed by me and considered in my medical decision making (see chart for details).    MDM Rules/Calculators/A&P                          Manuel Baker is a 24 year old male with history of heart transplant, seizures, diabetes who presents the ED after seizure-like event.  Patient with unremarkable vitals.  No fever.  Patient states that he went out drinking alcohol last night at a friend's.  Woke up not feeling very good.  Had seizure-like event.  EMS found patient to have blood  sugar of 33.  Took his long-acting insulin last night.  Had not had anything to eat or drink other than alcohol.  Suspect breakthrough seizure in the setting of hypoglycemia and alcohol use.  Vimpat was given.  Lab work shows no significant anemia, electrolyte abnormality, kidney injury.  Blood sugar has stabilized following dextrose with EMS.  We will get head and neck CT to further evaluate for any injuries given fall during seizure.  Having some low back pain will get x-ray.  Neurologically he appears intact.  EKG shows sinus tachycardia.  Unchanged from prior EKGs.  Patient not having any chest pain.  CT scan of head and neck are unremarkable.  Blood sugar has stabilized.  Patient is able to eat some.  Was able to ambulate without any issues.  Appears back to his baseline.  Discharged in the ED in good condition.  Understands return precautions.  This chart was dictated using voice recognition software.  Despite best efforts to proofread,  errors can occur which can change the documentation meaning.    Final Clinical Impression(s) / ED Diagnoses Final diagnoses:  Hypoglycemia  Seizure-like activity [facility])    Rx / DC Orders ED Discharge Orders    None       Virgina Norfolk, DO 06/25/20 2112

## 2020-06-25 NOTE — Discharge Instructions (Addendum)
Did not take any insulin therapy today.  Check your blood sugars prior to giving yourself next dose of insulin.  Be careful not to cause low blood sugar and make sure you always have something to eat or drink in case her blood sugar gets low.  Continue to take all your medications as prescribed otherwise.

## 2020-06-25 NOTE — ED Notes (Signed)
Pt ambulated in hall. MD witnessed.

## 2020-10-13 ENCOUNTER — Emergency Department (HOSPITAL_COMMUNITY)
Admission: EM | Admit: 2020-10-13 | Discharge: 2020-10-13 | Disposition: A | Discharge status: Other Acute Inpatient Hospital | Payer: BLUE CROSS/BLUE SHIELD | Attending: Emergency Medicine | Admitting: Emergency Medicine

## 2020-10-13 ENCOUNTER — Emergency Department (HOSPITAL_COMMUNITY): Payer: BLUE CROSS/BLUE SHIELD

## 2020-10-13 ENCOUNTER — Encounter (HOSPITAL_COMMUNITY): Payer: Self-pay | Admitting: Emergency Medicine

## 2020-10-13 DIAGNOSIS — A419 Sepsis, unspecified organism: Secondary | ICD-10-CM | POA: Diagnosis not present

## 2020-10-13 DIAGNOSIS — Z7982 Long term (current) use of aspirin: Secondary | ICD-10-CM | POA: Diagnosis not present

## 2020-10-13 DIAGNOSIS — F1590 Other stimulant use, unspecified, uncomplicated: Secondary | ICD-10-CM | POA: Insufficient documentation

## 2020-10-13 DIAGNOSIS — E1010 Type 1 diabetes mellitus with ketoacidosis without coma: Secondary | ICD-10-CM | POA: Diagnosis not present

## 2020-10-13 DIAGNOSIS — Z79899 Other long term (current) drug therapy: Secondary | ICD-10-CM | POA: Diagnosis not present

## 2020-10-13 DIAGNOSIS — E1065 Type 1 diabetes mellitus with hyperglycemia: Secondary | ICD-10-CM | POA: Insufficient documentation

## 2020-10-13 DIAGNOSIS — Z20822 Contact with and (suspected) exposure to covid-19: Secondary | ICD-10-CM | POA: Insufficient documentation

## 2020-10-13 DIAGNOSIS — R000 Tachycardia, unspecified: Secondary | ICD-10-CM | POA: Insufficient documentation

## 2020-10-13 DIAGNOSIS — R1110 Vomiting, unspecified: Secondary | ICD-10-CM | POA: Diagnosis present

## 2020-10-13 LAB — BASIC METABOLIC PANEL
Anion gap: 0 — ABNORMAL LOW (ref 5–15)
BUN: 9 mg/dL (ref 6–20)
CO2: 20 mmol/L — ABNORMAL LOW (ref 22–32)
Calcium: 6.7 mg/dL — ABNORMAL LOW (ref 8.9–10.3)
Chloride: 102 mmol/L (ref 98–111)
Creatinine, Ser: 0.85 mg/dL (ref 0.61–1.24)
GFR, Estimated: >60 mL/min (ref >60)
Glucose, Bld: 667 mg/dL (ref 70–99)
Potassium: >7.5 mmol/L (ref 3.5–5.1)
Sodium: 114 mmol/L — CL (ref 135–145)

## 2020-10-13 LAB — CBC WITH DIFFERENTIAL/PLATELET
Abs Immature Granulocytes: 0.37 10*3/uL — ABNORMAL HIGH (ref 0.00–0.07)
Basophils Absolute: 0 10*3/uL (ref 0.0–0.1)
Basophils Relative: 1 %
Eosinophils Absolute: 0 10*3/uL (ref 0.0–0.5)
Eosinophils Relative: 0 %
HCT: 38.6 % — ABNORMAL LOW (ref 39.0–52.0)
Hemoglobin: 13.4 g/dL (ref 13.0–17.0)
Immature Granulocytes: 9 %
Lymphocytes Relative: 14 %
Lymphs Abs: 0.6 10*3/uL — ABNORMAL LOW (ref 0.7–4.0)
MCH: 27.3 pg (ref 26.0–34.0)
MCHC: 34.7 g/dL (ref 30.0–36.0)
MCV: 78.8 fL — ABNORMAL LOW (ref 80.0–100.0)
Monocytes Absolute: 0.5 10*3/uL (ref 0.1–1.0)
Monocytes Relative: 13 %
Neutro Abs: 2.6 10*3/uL (ref 1.7–7.7)
Neutrophils Relative %: 63 %
Platelets: 470 10*3/uL — ABNORMAL HIGH (ref 150–400)
RBC: 4.9 MIL/uL (ref 4.22–5.81)
RDW: 13.3 % (ref 11.5–15.5)
WBC: 4.1 10*3/uL (ref 4.0–10.5)
nRBC: 0 % (ref 0.0–0.2)

## 2020-10-13 LAB — I-STAT VENOUS BLOOD GAS, ED
Acid-Base Excess: 13 mmol/L — ABNORMAL HIGH (ref 0.0–2.0)
Bicarbonate: 32.7 mmol/L — ABNORMAL HIGH (ref 20.0–28.0)
Calcium, Ion: 0.99 mmol/L — ABNORMAL LOW (ref 1.15–1.40)
HCT: 41 % (ref 39.0–52.0)
Hemoglobin: 13.9 g/dL (ref 13.0–17.0)
O2 Saturation: 100 %
Potassium: 3.4 mmol/L — ABNORMAL LOW (ref 3.5–5.1)
Sodium: 135 mmol/L (ref 135–145)
TCO2: 33 mmol/L — ABNORMAL HIGH (ref 22–32)
pCO2, Ven: 27.3 mmHg — ABNORMAL LOW (ref 44.0–60.0)
pH, Ven: 7.686 (ref 7.250–7.430)
pO2, Ven: 131 mmHg — ABNORMAL HIGH (ref 32.0–45.0)

## 2020-10-13 LAB — I-STAT CHEM 8, ED
BUN: 11 mg/dL (ref 6–20)
Calcium, Ion: 1.09 mmol/L — ABNORMAL LOW (ref 1.15–1.40)
Chloride: 99 mmol/L (ref 98–111)
Creatinine, Ser: 1 mg/dL (ref 0.61–1.24)
Glucose, Bld: 76 mg/dL (ref 70–99)
HCT: 36 % — ABNORMAL LOW (ref 39.0–52.0)
Hemoglobin: 12.2 g/dL — ABNORMAL LOW (ref 13.0–17.0)
Potassium: 3.4 mmol/L — ABNORMAL LOW (ref 3.5–5.1)
Sodium: 142 mmol/L (ref 135–145)
TCO2: 29 mmol/L (ref 22–32)

## 2020-10-13 LAB — URINALYSIS, ROUTINE W REFLEX MICROSCOPIC
Bacteria, UA: NONE SEEN
Bilirubin Urine: NEGATIVE
Glucose, UA: >=500 mg/dL — AB
Hgb urine dipstick: NEGATIVE
Ketones, ur: 5 mg/dL — AB
Leukocytes,Ua: NEGATIVE
Nitrite: NEGATIVE
Protein, ur: 30 mg/dL — AB
Specific Gravity, Urine: 1.018 (ref 1.005–1.030)
pH: 8 (ref 5.0–8.0)

## 2020-10-13 LAB — CBC
HCT: 39 % (ref 39.0–52.0)
Hemoglobin: 13.5 g/dL (ref 13.0–17.0)
MCH: 27 pg (ref 26.0–34.0)
MCHC: 34.6 g/dL (ref 30.0–36.0)
MCV: 78 fL — ABNORMAL LOW (ref 80.0–100.0)
Platelets: 442 10*3/uL — ABNORMAL HIGH (ref 150–400)
RBC: 5 MIL/uL (ref 4.22–5.81)
RDW: 13.4 % (ref 11.5–15.5)
WBC: 4.2 10*3/uL (ref 4.0–10.5)
nRBC: 0 % (ref 0.0–0.2)

## 2020-10-13 LAB — LACTIC ACID, PLASMA
Lactic Acid, Venous: 3.7 mmol/L (ref 0.5–1.9)
Lactic Acid, Venous: 2.3 mmol/L (ref 0.5–1.9)

## 2020-10-13 LAB — TROPONIN I (HIGH SENSITIVITY)
Troponin I (High Sensitivity): 7 ng/L (ref <18)
Troponin I (High Sensitivity): 7 ng/L (ref <18)

## 2020-10-13 LAB — COMPREHENSIVE METABOLIC PANEL
ALT: 19 U/L (ref 0–44)
AST: 19 U/L (ref 15–41)
Albumin: 4.5 g/dL (ref 3.5–5.0)
Alkaline Phosphatase: 76 U/L (ref 38–126)
Anion gap: 22 — ABNORMAL HIGH (ref 5–15)
BUN: 15 mg/dL (ref 6–20)
CO2: 25 mmol/L (ref 22–32)
Calcium: 10.2 mg/dL (ref 8.9–10.3)
Chloride: 89 mmol/L — ABNORMAL LOW (ref 98–111)
Creatinine, Ser: 1.61 mg/dL — ABNORMAL HIGH (ref 0.61–1.24)
GFR, Estimated: >60 mL/min (ref >60)
Glucose, Bld: 505 mg/dL (ref 70–99)
Potassium: 3.5 mmol/L (ref 3.5–5.1)
Sodium: 136 mmol/L (ref 135–145)
Total Bilirubin: 0.9 mg/dL (ref 0.3–1.2)
Total Protein: 7.6 g/dL (ref 6.5–8.1)

## 2020-10-13 LAB — LIPASE, BLOOD: Lipase: 23 U/L (ref 11–51)

## 2020-10-13 LAB — RESPIRATORY PANEL BY RT PCR (FLU A&B, COVID)
Influenza A by PCR: NEGATIVE
Influenza B by PCR: NEGATIVE
SARS Coronavirus 2 by RT PCR: NEGATIVE

## 2020-10-13 LAB — CBG MONITORING, ED
Glucose-Capillary: 481 mg/dL — ABNORMAL HIGH (ref 70–99)
Glucose-Capillary: 461 mg/dL — ABNORMAL HIGH (ref 70–99)
Glucose-Capillary: 384 mg/dL — ABNORMAL HIGH (ref 70–99)
Glucose-Capillary: 255 mg/dL — ABNORMAL HIGH (ref 70–99)
Glucose-Capillary: 113 mg/dL — ABNORMAL HIGH (ref 70–99)
Glucose-Capillary: 518 mg/dL (ref 70–99)
Glucose-Capillary: 136 mg/dL — ABNORMAL HIGH (ref 70–99)
Glucose-Capillary: 74 mg/dL (ref 70–99)
Glucose-Capillary: 120 mg/dL — ABNORMAL HIGH (ref 70–99)
Glucose-Capillary: 177 mg/dL — ABNORMAL HIGH (ref 70–99)

## 2020-10-13 LAB — APTT: aPTT: 27 seconds (ref 24–36)

## 2020-10-13 LAB — BRAIN NATRIURETIC PEPTIDE: B Natriuretic Peptide: 93.7 pg/mL (ref 0.0–100.0)

## 2020-10-13 LAB — PROTIME-INR
INR: 1 (ref 0.8–1.2)
Prothrombin Time: 12.8 seconds (ref 11.4–15.2)

## 2020-10-13 MED ORDER — VANCOMYCIN HCL IN DEXTROSE 1-5 GM/200ML-% IV SOLN
1000.0000 mg | Freq: Once | INTRAVENOUS | Status: AC
  Administered 2020-10-13: 1000 mg via INTRAVENOUS
  Filled 2020-10-13: qty 200

## 2020-10-13 MED ORDER — DEXTROSE 50 % IV SOLN: 0.0000 mL | INTRAVENOUS | Status: DC | PRN

## 2020-10-13 MED ORDER — LACTATED RINGERS IV SOLN: INTRAVENOUS | Status: DC

## 2020-10-13 MED ORDER — INSULIN REGULAR(HUMAN) IN NACL 100-0.9 UT/100ML-% IV SOLN
INTRAVENOUS | Status: DC
  Administered 2020-10-13: 6 [IU]/h via INTRAVENOUS
  Filled 2020-10-13: qty 100

## 2020-10-13 MED ORDER — PIPERACILLIN-TAZOBACTAM 3.375 G IVPB
3.3750 g | Freq: Once | INTRAVENOUS | Status: DC
  Filled 2020-10-13: qty 50

## 2020-10-13 MED ORDER — SODIUM CHLORIDE 0.9 % IV SOLN
2.0000 g | Freq: Once | INTRAVENOUS | Status: AC
  Administered 2020-10-13: 2 g via INTRAVENOUS
  Filled 2020-10-13: qty 2

## 2020-10-13 MED ORDER — VANCOMYCIN HCL 500 MG/100ML IV SOLN
500.0000 mg | Freq: Two times a day (BID) | INTRAVENOUS | Status: DC
  Filled 2020-10-13: qty 100

## 2020-10-13 MED ORDER — LACTATED RINGERS IV BOLUS
20.0000 mL/kg | Freq: Once | INTRAVENOUS | Status: AC
  Administered 2020-10-13: 1044 mL via INTRAVENOUS

## 2020-10-13 MED ORDER — DEXTROSE IN LACTATED RINGERS 5 % IV SOLN: INTRAVENOUS | Status: DC

## 2020-10-13 MED ORDER — POTASSIUM CHLORIDE 10 MEQ/100ML IV SOLN
10.0000 meq | INTRAVENOUS | Status: AC
  Administered 2020-10-13 (×4): 10 meq via INTRAVENOUS
  Filled 2020-10-13 (×2): qty 100

## 2020-10-13 MED ORDER — LACTATED RINGERS IV BOLUS (SEPSIS)
500.0000 mL | Freq: Once | INTRAVENOUS | Status: AC
  Administered 2020-10-13: 500 mL via INTRAVENOUS

## 2020-10-13 MED ORDER — MORPHINE SULFATE (PF) 4 MG/ML IV SOLN
4.0000 mg | Freq: Once | INTRAVENOUS | Status: AC
  Administered 2020-10-13: 4 mg via INTRAVENOUS
  Filled 2020-10-13: qty 1

## 2020-10-13 MED ORDER — ACETAMINOPHEN 500 MG PO TABS
1000.0000 mg | ORAL_TABLET | Freq: Once | ORAL | Status: AC
  Administered 2020-10-13: 1000 mg via ORAL
  Filled 2020-10-13: qty 2

## 2020-10-13 MED ORDER — SODIUM CHLORIDE 0.9 % IV SOLN
2.0000 g | Freq: Three times a day (TID) | INTRAVENOUS | Status: DC
  Administered 2020-10-13: 2 g via INTRAVENOUS
  Filled 2020-10-13: qty 2

## 2020-10-13 MED ORDER — METOCLOPRAMIDE HCL 5 MG/ML IJ SOLN
10.0000 mg | Freq: Once | INTRAMUSCULAR | Status: AC
  Administered 2020-10-13: 10 mg via INTRAVENOUS
  Filled 2020-10-13: qty 2

## 2020-10-13 NOTE — Progress Notes (Signed)
Pharmacy Antibiotic Note  Manuel Baker is a 24 y.o. male admitted on 10/13/2020 with infection of unknown source.  Pharmacy has been consulted for Cefepime and vancomycin dosing. WBC wnl but LA is elevated at 3.7. SCr up to 1.61 (BL 1.02).   Plan: -Cefepime 2 gm IV Q 8 hours -Vancomycin 1 gm IV load followed by vancomycin 500 mg IV Q 12 hours -Monitor CBC, renal fx, cultures and clinical progress -VT at SS    Height: 5\' 7"  (170.2 cm) Weight: 52.2 kg (115 lb) IBW/kg (Calculated) : 66.1  Temp (24hrs), Avg:100.3 F (37.9 C), Min:100.3 F (37.9 C), Max:100.3 F (37.9 C)  No results for input(s): WBC, CREATININE, LATICACIDVEN, VANCOTROUGH, VANCOPEAK, VANCORANDOM, GENTTROUGH, GENTPEAK, GENTRANDOM, TOBRATROUGH, TOBRAPEAK, TOBRARND, AMIKACINPEAK, AMIKACINTROU, AMIKACIN in the last 168 hours.  CrCl cannot be calculated (Patient's most recent lab result is older than the maximum 21 days allowed.).    Allergies  Allergen Reactions  . Ibuprofen     Told not to take ibuprofen after cardiac surgery    Antimicrobials this admission: Cefepime 11/19 >>  Vanc 11/19 >>   Dose adjustments this admission:   Microbiology results: 11/19 BCx:  11/19 UCx:     Thank you for allowing pharmacy to be a part of this patient's care.  12/19, PharmD., BCPS, BCCCP Clinical Pharmacist Please refer to [facility] for unit-specific pharmacist

## 2020-10-13 NOTE — ED Provider Notes (Signed)
[facility] EMERGENCY DEPARTMENT Provider Note   CSN: [id number] Arrival date & time: 10/13/20  1026     History Chief Complaint  Patient presents with  . Emesis    Manuel Baker is a 24 y.o. male.  Manuel Baker is a 24 y.o. male with a history of congenital heart disease s/p heart transplant March 2021, type 1 diabetes, frequent DKA, seizures, gastroparesis, who presents to the ED via EMS for evaluation of 3 days of vomiting.  Patient reports that he has frequent episodes of gastroparesis and this feels exactly like that, he states that almost every time he has an issue with his gastroparesis he ends up in DKA.  He has been having worsening issues with his gastroparesis since having his heart transplant in March, he is followed by the [facility] transplant team.  He is on tacrolimus and prednisone for antirejection medications, but has not been able to keep these down for the past 3 days.  He states that he is also not been taking insulin because he has been vomiting and not eating.  CBG of 558 with EMS.  Patient states that he has been feeling poorly, but was not aware that he had any fevers states that he has had some very mild chest pain primarily after vomiting.  He denies shortness of breath or cough.  Has not noted any blood in his vomit.  Denies any diarrhea or constipation.  Patient has had both of his Covid vaccines and has not had any known sick contacts.        Past Medical History:  Diagnosis Date  . Congenital heart disease   . Diabetes mellitus without complication (HCC)   . Seizures [facility])     Patient Active Problem List   Diagnosis Date Noted  . Septic embolism (HCC)   . Libman Sacks endocarditis (HCC)   . Leucocytosis   . [place] endocarditis   . 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 Uncontrolled type 1 diabetes mellitus with hyperglycemia (HCC) 01/03/2018  . Seizure (HCC)  01/03/2018  . Stroke (cerebrum) (HCC) 01/03/2018  . Stroke (HCC) 01/03/2018  . Ebstein's anomaly     Past Surgical History:  Procedure Laterality Date  . open heart surgery    . TEE WITHOUT CARDIOVERSION N/A 01/06/2018   Procedure: TRANSESOPHAGEAL ECHOCARDIOGRAM (TEE);  Surgeon: Wendall Stade, MD;  Location: [facility] ENDOSCOPY;  Service: Cardiovascular;  Laterality: N/A;       Family History  Problem Relation Age of Onset  . Diabetes Mellitus I Sister     Social History   Tobacco Use  . Smoking status: Never Smoker  . Smokeless tobacco: Never Used  Vaping Use  . Vaping Use: Never used  Substance Use Topics  . Alcohol use: Yes    Comment: social  . Drug use: Yes    Types: Marijuana    Home Medications Prior to Admission medications   Medication Sig Start Date End Date Taking? Authorizing Provider  acetaminophen (TYLENOL) 500 MG tablet Take 500 mg by mouth every 6 (six) hours as needed for mild pain.   Yes [provider]  amLODipine (NORVASC) 5 MG tablet Take 5 mg by mouth daily.  10/01/20 10/01/21 Yes [provider]  doxazosin (CARDURA) 2 MG tablet Take 1 mg by mouth at bedtime.  10/01/20 10/01/21 Yes [provider]  insulin degludec (TRESIBA FLEXTOUCH) 100 UNIT/ML FlexTouch Pen Inject 14 Units into the  skin at bedtime.  08/04/20  Yes [provider]  insulin glulisine (APIDRA SOLOSTAR) 100 UNIT/ML Solostar Pen Inject 0-45 Units into the skin See admin instructions. total daily dose up to 45 units  with meals and snacks 08/04/20  Yes [provider]  lidocaine (XYLOCAINE) 2 % solution Use as directed 15 mLs in the mouth or throat as needed for mouth pain. 06/16/20  Yes Cardama, Amadeo GarnetPedro Eduardo, MD  pantoprazole (PROTONIX) 40 MG tablet 40 mg daily.  09/01/20 09/01/21 Yes [provider]  predniSONE (DELTASONE) 5 MG tablet Take 5 mg by mouth daily with breakfast.  08/04/20  Yes [provider]  sildenafil (VIAGRA) 25 MG tablet  Take by mouth. 09/14/20 10/14/20 Yes [provider]  acetaminophen (TYLENOL) 500 MG tablet Take 2 tablets (1,000 mg total) by mouth every 8 (eight) hours as needed for fever. Patient not taking: Reported on 06/16/2020 01/14/19   McDonald, Mia A, PA-C  ASPIRIN LOW DOSE 81 MG EC tablet Take 81 mg by mouth daily. 09/02/20   [provider]  Insulin Syringe-Needle U-100 (INSULIN SYRINGE .3CC/31GX5/16") 31G X 5/16" 0.3 ML MISC 1 Act by Does not apply route 4 (four) times daily -  before meals and at bedtime. 01/09/18   Rolly SalterPatel, Pranav M, MD  oseltamivir (TAMIFLU) 75 MG capsule Take 1 capsule (75 mg total) by mouth every 12 (twelve) hours. Patient not taking: Reported on 10/13/2020 01/14/19   McDonald, Pedro EarlsMia A, PA-C  pravastatin (PRAVACHOL) 40 MG tablet Take 40 mg by mouth daily. 09/02/20   [provider]  promethazine-dextromethorphan (PROMETHAZINE-DM) 6.25-15 MG/5ML syrup Take 5 mLs by mouth 4 (four) times daily as needed for cough. Patient not taking: Reported on 06/16/2020 01/14/19   McDonald, Pedro EarlsMia A, PA-C  sertraline (ZOLOFT) 25 MG tablet Take 25 mg by mouth daily. 09/06/20   [provider]  VIMPAT 50 MG TABS tablet Take 50 mg by mouth 2 (two) times daily. 08/04/20   [provider]    Allergies    Ibuprofen and Nsaids  Review of Systems   Review of Systems  Constitutional: Positive for chills and fever.  HENT: Negative for congestion, rhinorrhea and sore throat.   Respiratory: Negative for cough and shortness of breath.   Cardiovascular: Negative for chest pain.  Gastrointestinal: Positive for abdominal pain, nausea and vomiting. Negative for constipation and diarrhea.  Genitourinary: Negative for dysuria and frequency.  Musculoskeletal: Negative for arthralgias and neck pain.  Skin: Negative for color change and rash.  Neurological: Negative for dizziness, syncope and light-headedness.  All other systems reviewed and are negative.   Physical  Exam Updated Vital Signs BP (!) 149/91   Pulse (!) 136   Temp 100.9 F (38.3 C)  (Rectal)   Resp (!) 22   Ht 5\' 7"  (1.702 m)   Wt 52.2 kg   SpO2 100%   BMI 18.01 kg/m   Physical Exam Vitals and nursing note reviewed.  Constitutional:      General: He is not in acute distress.    Appearance: Normal appearance. He is well-developed. He is ill-appearing. He is not diaphoretic.     Comments: Patient is ill-appearing, dry heaving but alert and responsive with good mentation  HENT:     Head: Normocephalic and atraumatic.     Mouth/Throat:     Mouth: Mucous membranes are dry.  Eyes:     General:        Right eye: No discharge.  Left eye: No discharge.     Extraocular Movements: Extraocular movements intact.     Pupils: Pupils are equal, round, and reactive to light.  Cardiovascular:     Rate and Rhythm: Regular rhythm. Tachycardia present.     Pulses: Normal pulses.     Heart sounds: Normal heart sounds. No murmur heard.  No friction rub. No gallop.   Pulmonary:     Effort: Pulmonary effort is normal. No respiratory distress.     Breath sounds: Normal breath sounds. No wheezing or rales.     Comments: Respirations equal and unlabored, patient able to speak in full sentences, lungs clear to auscultation bilaterally Abdominal:     General: Bowel sounds are normal. There is no distension.     Palpations: Abdomen is soft. There is no mass.     Tenderness: There is abdominal tenderness. There is no guarding.     Comments: Abdomen is soft, nondistended, bowel sounds present throughout, there is some mild generalized tenderness throughout the abdomen most notably in the epigastric region without peritoneal signs or guarding  Musculoskeletal:        General: No deformity.     Cervical back: Neck supple.  Skin:    General: Skin is warm and dry.     Capillary Refill: Capillary refill takes less than 2 seconds.  Neurological:     Mental Status: He is alert.     Coordination:  Coordination normal.     Comments: Speech is clear, able to follow commands CN III-XII intact Normal strength in upper and lower extremities bilaterally including dorsiflexion and plantar flexion, strong and equal grip strength Sensation normal to light and sharp touch Moves extremities without ataxia, coordination intact  Psychiatric:        Mood and Affect: Mood normal.        Behavior: Behavior normal.     ED Results / Procedures / Treatments   Labs (all labs ordered are listed, but only abnormal results are displayed) Labs Reviewed  COMPREHENSIVE METABOLIC PANEL - Abnormal; Notable for the following components:      Result Value   Chloride 89 (*)    Glucose, Bld 505 (*)    Creatinine, Ser 1.61 (*)    Anion gap 22 (*)    All other components within normal limits  CBC - Abnormal; Notable for the following components:   MCV 78.0 (*)    Platelets 442 (*)    All other components within normal limits  URINALYSIS, ROUTINE W REFLEX MICROSCOPIC - Abnormal; Notable for the following components:   Color, Urine STRAW (*)    Glucose, UA >=500 (*)    Ketones, ur 5 (*)    Protein, ur 30 (*)    All other components within normal limits  LACTIC ACID, PLASMA - Abnormal; Notable for the following components:   Lactic Acid, Venous 3.7 (*)    All other components within normal limits  LACTIC ACID, PLASMA - Abnormal; Notable for the following components:   Lactic Acid, Venous 2.3 (*)    All other components within normal limits  CBC WITH DIFFERENTIAL/PLATELET - Abnormal; Notable for the following components:   HCT 38.6 (*)    MCV 78.8 (*)    Platelets 470 (*)    Lymphs Abs 0.6 (*)    Abs Immature Granulocytes 0.37 (*)    All other components within normal limits  I-STAT VENOUS BLOOD GAS, ED - Abnormal; Notable for the following components:   pH, Ven 7.686 (*)  pCO2, Ven 27.3 (*)    pO2, Ven 131.0 (*)    Bicarbonate 32.7 (*)    TCO2 33 (*)    Acid-Base Excess 13.0 (*)    Potassium  3.4 (*)    Calcium, Ion 0.99 (*)    All other components within normal limits  CBG MONITORING, ED - Abnormal; Notable for the following components:   Glucose-Capillary 481 (*)    All other components within normal limits  CBG MONITORING, ED - Abnormal; Notable for the following components:   Glucose-Capillary 461 (*)    All other components within normal limits  CBG MONITORING, ED - Abnormal; Notable for the following components:   Glucose-Capillary 384 (*)    All other components within normal limits  CBG MONITORING, ED - Abnormal; Notable for the following components:   Glucose-Capillary 255 (*)    All other components within normal limits  CBG MONITORING, ED - Abnormal; Notable for the following components:   Glucose-Capillary 113 (*)    All other components within normal limits  CULTURE, BLOOD (ROUTINE X 2)  CULTURE, BLOOD (ROUTINE X 2)  RESPIRATORY PANEL BY RT PCR (FLU A&B, COVID)  URINE CULTURE  LIPASE, BLOOD  PROTIME-INR  APTT  BRAIN NATRIURETIC PEPTIDE  TACROLIMUS LEVEL  BASIC METABOLIC PANEL  I-STAT VENOUS BLOOD GAS, ED  TROPONIN I (HIGH SENSITIVITY)  TROPONIN I (HIGH SENSITIVITY)    EKG EKG Interpretation  Date/Time:  Friday October 13 2020 11:19:45 EST Ventricular Rate:  125 PR Interval:    QRS Duration: 97 QT Interval:  364 QTC Calculation: 525 R Axis:   84 Text Interpretation: Sinus tachycardia Probable left atrial enlargement RSR' in V1 or V2, right VCD or RVH LVH with secondary repolarization abnormality Probable lateral infarct, age indeterminate Prolonged QT interval NO STEMI Confirmed by Alvester Chou [phone]) on 10/13/2020 12:43:51 PM   Radiology DG Chest Port 1 View  Result Date: 10/13/2020 CLINICAL DATA:  Questionable sepsis, evaluate for abnormality. EXAM: PORTABLE CHEST 1 VIEW Prior chest radiograph 06/15/2020. FINDINGS: Prior median sternotomy. Heart size within normal limits. No appreciable airspace consolidation or pulmonary edema. No  evidence of pleural effusion or pneumothorax. No acute bony abnormality is identified. IMPRESSION: No evidence of acute cardiopulmonary abnormality. Electronically Signed   By: Jackey Loge DO   On: 10/13/2020 12:10    Procedures .Critical Care Performed by: Dartha Lodge, PA-C Authorized by: Dartha Lodge, PA-C   Critical care provider statement:    Critical care time (minutes):  60   Critical care was necessary to treat or prevent imminent or life-threatening deterioration of the following conditions:  Sepsis and metabolic crisis   Critical care was time spent personally by me on the following activities:  Discussions with consultants, evaluation of patient's response to treatment, examination of patient, ordering and performing treatments and interventions, ordering and review of laboratory studies, ordering and review of radiographic studies, pulse oximetry, re-evaluation of patient's condition, obtaining history from patient or surrogate and review of old charts   (including critical care time)  Medications Ordered in ED Medications  lactated ringers infusion ( Intravenous New Bag/Given 10/13/20 1238)  insulin regular, human (MYXREDLIN) 100 units/ 100 mL infusion (2.8 Units/hr Intravenous Rate/Dose Change 10/13/20 1413)  lactated ringers infusion (0 mLs Intravenous Hold 10/13/20 1524)  dextrose 5 % in lactated ringers infusion (0 mLs Intravenous Hold 10/13/20 1239)  dextrose 50 % solution 0-50 mL (has no administration in time range)  potassium chloride 10 mEq in 100 mL IVPB (0  mEq Intravenous Stopped 10/13/20 1525)  ceFEPIme (MAXIPIME) 2 g in sodium chloride 0.9 % 100 mL IVPB (has no administration in time range)  vancomycin (VANCOREADY) IVPB 500 mg/100 mL (has no administration in time range)  lactated ringers bolus 500 mL (0 mLs Intravenous Stopped 10/13/20 1238)  ceFEPIme (MAXIPIME) 2 g in sodium chloride 0.9 % 100 mL IVPB (0 g Intravenous Stopped 10/13/20 1238)  vancomycin  (VANCOCIN) IVPB 1000 mg/200 mL premix (0 mg Intravenous Stopped 10/13/20 1414)  metoCLOPramide (REGLAN) injection 10 mg (10 mg Intravenous Given 10/13/20 1113)  morphine 4 MG/ML injection 4 mg (4 mg Intravenous Given 10/13/20 1114)  acetaminophen (TYLENOL) tablet 1,000 mg (1,000 mg Oral Given 10/13/20 1211)  lactated ringers bolus 1,044 mL (1,044 mLs Intravenous New Bag/Given 10/13/20 1257)    ED Course  I have reviewed the triage vital signs and the nursing notes.  Pertinent labs & imaging results that were available during my care of the patient were reviewed by me and considered in my medical decision making (see chart for details).    MDM Rules/Calculators/A&P                         24 year old male with history of recent heart transplant in March arrives ill-appearing with 3 days of vomiting, blood sugar of 558 with EMS, type I diabetic with history of frequent DKA episodes.  Patient was unaware of this but on arrival he is febrile to 100.9 with rectal temp, tachycardic to the 130s.  Patient meets SIRS criteria, he is vomiting but otherwise has no focal infectious symptoms but he is on immune suppression with his transplant is an extremely high risk for infection.  Code sepsis initiated with broad-spectrum antibiotics, patient is currently normotensive to hypertensive so we will hold off on aggressive fluid bolus at this time.  Sepsis work-up initiated.  Patient is normotensive, will hold off on aggressive 30 cc/kg fluid bolus pending lactic acid and further evaluation.  Will get chest x-ray, urinalysis and urine culture as well as blood cultures to look for source of infection.  Patient has very mild generalized abdominal tenderness that is nonfocal and likely due to his gastroparesis, will hold off on abdominal imaging at this time.  I did review patient's cardiac records from [facility] and most recent echo shows an EF of 55% we will also check troponin and BNP to look for signs of worsening heart  function or rejection.  Tacrolimus level sent.  I also have high clinical suspicion for DKA, blood sugars in the 500s on arrival, will wait for metabolic panel before starting patient on insulin drip.  Interventions: Patient given 500 cc fluid bolus, 1000 mg Tylenol, 10 mg Reglan for continued nausea and vomiting and 4 mg morphine for pain  I have independently ordered, reviewed and interpreted all labs and imaging: CBC: No leukocytosis, normal hemoglobin CMP: Glucose of 505, normal CO2, creatinine is increased from baseline of 1.0 at 1.61 today, patient also with anion gap of 22, no other significant electrolyte derangements consistent with DKA will start patient on IV fluid bolus and insulin drip per DKA protocol Lactic acid: Elevated at 3.7, patient has already received broad-spectrum antibiotics and receiving fluids UA: Small amount of ketones noted, no clear evidence of infection Covid/flu: Negative  Despite concern for DKA patient's VBG shows alkalosis with pH of 7.686, on review of patient's labs he frequently has an alkalosis, despite this we will still treat with insulin drip to  improve blood sugars and then will repeat  Chest x-ray is clear without evidence of pneumonia, pulmonary edema or other acute cardiopulmonary disease.  EKG shows sinus rhythm without concerning ischemic changes or arrhythmia.  Patient's vital significantly improving with IV fluids, Tylenol and antibiotics.  Tachycardia is resolving, fevers improved, blood pressures remaining stable.  Given concern for sepsis as well as DKA and his heart transplant patient feel that he would be best served at his transplant center.  Will call [facility] transfer line to discuss with transplant team for likely transfer.  I discussed case with Moshe Salisbury with [facility] transfer line, and he agrees that patient would be best served with the transplant team.  He has discussed case with Dr. Florestine Avers with Heart Failure and Transplant team who  accepts patient for transfer to [facility] as soon as they have a bed available, they do not currently have a bed but hope to have one this evening after 5 PM and will call back with an update.  Heart transplant team agrees with current management and does not recommend any additional work-up at this time.  At shift change care signed out to PA Sharen Heck pending update from [facility] regarding patient's transfer. Will also repeat BMP and VBG.   Final Clinical Impression(s) / ED Diagnoses Final diagnoses:  Sepsis without acute organ dysfunction, due to unspecified organism [facility])  Diabetic ketoacidosis without coma associated with type 1 diabetes mellitus [facility])    Rx / DC Orders ED Discharge Orders    None       Legrand Rams 10/13/20 1632    Terald Sleeper, MD 10/18/20 1334

## 2020-10-13 NOTE — ED Notes (Signed)
CBG 120 

## 2020-10-13 NOTE — Progress Notes (Signed)
elink monitoring for sepsis protocol 

## 2020-10-13 NOTE — ED Triage Notes (Signed)
Pt arrives via [facility] for c/o vomiting for the past few days, hx of gastroparesis since heart transplant in March. CBG 558 with hx of DM, 4mg  zofran and 100cc of NS. 20g IV LAC. HR 130, 152/98, 100% on ra.

## 2020-10-13 NOTE — ED Provider Notes (Signed)
H/o CHD s/p heart transplant March 2021 H/o/ DM Arrived febrile tachy +n/v H/o gatroparesis  No other localizing infection symptoms Has not taken tacrolimus due to n/v CBG 500s AG 22 Code sepsis and DKA protocol Normal cardiac labs VS improving after tx of fever Lactic acid improving Unknown source of fever at this time No imaging today - ok per [facility] team to defer Cultures pending EDPA spoke to [facility] Transplant team Dr Allena Katz  Patient priority status [facility] without beds maybe after 5 pm  Physical Exam  BP (!) 161/85 (BP Location: Right Arm)   Pulse (!) 112   Temp 98.7 F (37.1 C) (Oral)   Resp 12   Ht 5\' 7"  (1.702 m)   Wt 52.2 kg   SpO2 100%   BMI 18.01 kg/m   Physical Exam Constitutional:      Appearance: He is well-developed.  HENT:     Head: Normocephalic.     Nose: Nose normal.  Eyes:     General: Lids are normal.  Cardiovascular:     Rate and Rhythm: Normal rate.     Comments: HR low 100s during encounter Pulmonary:     Effort: Pulmonary effort is normal. No respiratory distress.  Abdominal:     Palpations: Abdomen is soft.     Tenderness: There is abdominal tenderness.     Comments: Generalized tenderness. No GRR  Musculoskeletal:        General: Normal range of motion.     Cervical back: Normal range of motion.  Neurological:     Mental Status: He is alert.  Psychiatric:        Behavior: Behavior normal.     ED Course/Procedures   Clinical Course as of Oct 13 2025  Fri Oct 13, 2020  1836 Sodium(!!): 114 [CG]  1836 Potassium(!!): >7.5 [CG]  1836 CO2(!): 20 [CG]  1836 Glucose(!!): 667 [CG]  1836 Creatinine: 0.85 [CG]  1836 Calcium(!): 6.7 [CG]  1836 BUN: 9 [CG]  1836 Chloride: 102 [CG]  2026 Potassium(!): 3.4 [CG]  2026 Creatinine: 1.00 [CG]  2026 Hemoglobin(!): 12.2 [CG]    Clinical Course User Index [CG] 2027, PA-C    Procedures  MDM   1515: Evaluated patient. Asleep. Feels better reports continued but improving  generalized abdominal pain. Discussed transfer of care at shift change. Awaiting [facility] ICU bed accepting Dr Liberty Handy.    ER work up reviewed and interpreted - DKA with AG but pH 6.68. Will repeat BMP and VBG  1835: repeat BMET as above - ?lab error.  Patient has been on DKA protocol and received IVF. RN to straight stick for repeat. Patient evaluated and mentating well. tolearting PO water.   2026: chem 8 with K 3.4, hgb 12.2, glucose 76.  Repeat CBG 2019 120.  No clinical decline on re-evaluation. [facility] en route for transport.    2027, PA-C 10/13/20 2028    2029, MD 10/14/20 [phone]

## 2020-10-14 LAB — URINE CULTURE: Culture: NO GROWTH

## 2020-10-17 LAB — TACROLIMUS LEVEL: Tacrolimus (FK506) - LabCorp: 2.3 ng/mL (ref 2.0–20.0)

## 2020-10-18 LAB — CULTURE, BLOOD (ROUTINE X 2)
Culture: NO GROWTH
Culture: NO GROWTH

## 2020-12-02 ENCOUNTER — Emergency Department (HOSPITAL_COMMUNITY): Payer: Medicaid Other

## 2020-12-02 ENCOUNTER — Other Ambulatory Visit: Payer: Self-pay

## 2020-12-02 ENCOUNTER — Inpatient Hospital Stay (HOSPITAL_COMMUNITY)
Admission: EM | Admit: 2020-12-02 | Discharge: 2020-12-07 | DRG: 073 | Disposition: A | Discharge status: Home / Self Care | Payer: Medicaid Other | Attending: Internal Medicine | Admitting: Internal Medicine

## 2020-12-02 DIAGNOSIS — E860 Dehydration: Secondary | ICD-10-CM | POA: Diagnosis not present

## 2020-12-02 DIAGNOSIS — Z8774 Personal history of (corrected) congenital malformations of heart and circulatory system: Secondary | ICD-10-CM

## 2020-12-02 DIAGNOSIS — G40909 Epilepsy, unspecified, not intractable, without status epilepticus: Secondary | ICD-10-CM

## 2020-12-02 DIAGNOSIS — Z9119 Patient's noncompliance with other medical treatment and regimen: Secondary | ICD-10-CM

## 2020-12-02 DIAGNOSIS — R569 Unspecified convulsions: Secondary | ICD-10-CM

## 2020-12-02 DIAGNOSIS — Z941 Heart transplant status: Secondary | ICD-10-CM

## 2020-12-02 DIAGNOSIS — Z833 Family history of diabetes mellitus: Secondary | ICD-10-CM

## 2020-12-02 DIAGNOSIS — E1143 Type 2 diabetes mellitus with diabetic autonomic (poly)neuropathy: Secondary | ICD-10-CM

## 2020-12-02 DIAGNOSIS — R7989 Other specified abnormal findings of blood chemistry: Secondary | ICD-10-CM

## 2020-12-02 DIAGNOSIS — K3184 Gastroparesis: Secondary | ICD-10-CM

## 2020-12-02 DIAGNOSIS — I252 Old myocardial infarction: Secondary | ICD-10-CM

## 2020-12-02 DIAGNOSIS — R112 Nausea with vomiting, unspecified: Secondary | ICD-10-CM

## 2020-12-02 DIAGNOSIS — Z8673 Personal history of transient ischemic attack (TIA), and cerebral infarction without residual deficits: Secondary | ICD-10-CM

## 2020-12-02 DIAGNOSIS — Z681 Body mass index (BMI) 19 or less, adult: Secondary | ICD-10-CM

## 2020-12-02 DIAGNOSIS — Q225 Ebstein's anomaly: Secondary | ICD-10-CM

## 2020-12-02 DIAGNOSIS — K209 Esophagitis, unspecified without bleeding: Secondary | ICD-10-CM

## 2020-12-02 DIAGNOSIS — G9341 Metabolic encephalopathy: Secondary | ICD-10-CM | POA: Diagnosis present

## 2020-12-02 DIAGNOSIS — F32A Depression, unspecified: Secondary | ICD-10-CM | POA: Diagnosis present

## 2020-12-02 DIAGNOSIS — E1043 Type 1 diabetes mellitus with diabetic autonomic (poly)neuropathy: Principal | ICD-10-CM | POA: Diagnosis present

## 2020-12-02 DIAGNOSIS — T1490XA Injury, unspecified, initial encounter: Secondary | ICD-10-CM

## 2020-12-02 DIAGNOSIS — E10649 Type 1 diabetes mellitus with hypoglycemia without coma: Secondary | ICD-10-CM | POA: Diagnosis present

## 2020-12-02 DIAGNOSIS — K5900 Constipation, unspecified: Secondary | ICD-10-CM | POA: Diagnosis not present

## 2020-12-02 DIAGNOSIS — Z8616 Personal history of COVID-19: Secondary | ICD-10-CM

## 2020-12-02 DIAGNOSIS — E43 Unspecified severe protein-calorie malnutrition: Secondary | ICD-10-CM | POA: Diagnosis present

## 2020-12-02 DIAGNOSIS — I10 Essential (primary) hypertension: Secondary | ICD-10-CM | POA: Diagnosis present

## 2020-12-02 DIAGNOSIS — Z7982 Long term (current) use of aspirin: Secondary | ICD-10-CM

## 2020-12-02 DIAGNOSIS — G8929 Other chronic pain: Secondary | ICD-10-CM | POA: Diagnosis present

## 2020-12-02 DIAGNOSIS — F1290 Cannabis use, unspecified, uncomplicated: Secondary | ICD-10-CM | POA: Diagnosis present

## 2020-12-02 DIAGNOSIS — R079 Chest pain, unspecified: Secondary | ICD-10-CM

## 2020-12-02 DIAGNOSIS — R739 Hyperglycemia, unspecified: Secondary | ICD-10-CM

## 2020-12-02 DIAGNOSIS — Z794 Long term (current) use of insulin: Secondary | ICD-10-CM

## 2020-12-02 DIAGNOSIS — Z886 Allergy status to analgesic agent status: Secondary | ICD-10-CM

## 2020-12-02 DIAGNOSIS — R1013 Epigastric pain: Secondary | ICD-10-CM

## 2020-12-02 DIAGNOSIS — D84821 Immunodeficiency due to drugs: Secondary | ICD-10-CM | POA: Diagnosis present

## 2020-12-02 DIAGNOSIS — N179 Acute kidney failure, unspecified: Secondary | ICD-10-CM | POA: Diagnosis present

## 2020-12-02 DIAGNOSIS — K920 Hematemesis: Secondary | ICD-10-CM | POA: Diagnosis present

## 2020-12-02 DIAGNOSIS — D509 Iron deficiency anemia, unspecified: Secondary | ICD-10-CM | POA: Diagnosis present

## 2020-12-02 DIAGNOSIS — E1065 Type 1 diabetes mellitus with hyperglycemia: Secondary | ICD-10-CM

## 2020-12-02 DIAGNOSIS — Z7952 Long term (current) use of systemic steroids: Secondary | ICD-10-CM

## 2020-12-02 DIAGNOSIS — R6511 Systemic inflammatory response syndrome (SIRS) of non-infectious origin with acute organ dysfunction: Secondary | ICD-10-CM | POA: Diagnosis present

## 2020-12-02 DIAGNOSIS — E876 Hypokalemia: Secondary | ICD-10-CM | POA: Diagnosis present

## 2020-12-02 DIAGNOSIS — K2100 Gastro-esophageal reflux disease with esophagitis, without bleeding: Secondary | ICD-10-CM | POA: Diagnosis present

## 2020-12-02 DIAGNOSIS — Z79899 Other long term (current) drug therapy: Secondary | ICD-10-CM

## 2020-12-02 DIAGNOSIS — Z20822 Contact with and (suspected) exposure to covid-19: Secondary | ICD-10-CM | POA: Diagnosis present

## 2020-12-02 LAB — RESP PANEL BY RT-PCR (FLU A&B, COVID) ARPGX2
Influenza A by PCR: NEGATIVE
Influenza B by PCR: NEGATIVE
SARS Coronavirus 2 by RT PCR: NEGATIVE

## 2020-12-02 LAB — COMPREHENSIVE METABOLIC PANEL
ALT: 26 U/L (ref 0–44)
AST: 31 U/L (ref 15–41)
Albumin: 4.5 g/dL (ref 3.5–5.0)
Alkaline Phosphatase: 97 U/L (ref 38–126)
Anion gap: 24 — ABNORMAL HIGH (ref 5–15)
BUN: 26 mg/dL — ABNORMAL HIGH (ref 6–20)
CO2: 24 mmol/L (ref 22–32)
Calcium: 9.6 mg/dL (ref 8.9–10.3)
Chloride: 83 mmol/L — ABNORMAL LOW (ref 98–111)
Creatinine, Ser: 2.51 mg/dL — ABNORMAL HIGH (ref 0.61–1.24)
GFR, Estimated: 36 mL/min — ABNORMAL LOW (ref >60)
Glucose, Bld: 572 mg/dL (ref 70–99)
Potassium: 2.9 mmol/L — ABNORMAL LOW (ref 3.5–5.1)
Sodium: 131 mmol/L — ABNORMAL LOW (ref 135–145)
Total Bilirubin: 1 mg/dL (ref 0.3–1.2)
Total Protein: 8 g/dL (ref 6.5–8.1)

## 2020-12-02 LAB — I-STAT VENOUS BLOOD GAS, ED
Acid-Base Excess: 18 mmol/L — ABNORMAL HIGH (ref 0.0–2.0)
Bicarbonate: 37.1 mmol/L — ABNORMAL HIGH (ref 20.0–28.0)
Calcium, Ion: 0.89 mmol/L — CL (ref 1.15–1.40)
HCT: 43 % (ref 39.0–52.0)
Hemoglobin: 14.6 g/dL (ref 13.0–17.0)
O2 Saturation: 100 %
Potassium: 2.8 mmol/L — ABNORMAL LOW (ref 3.5–5.1)
Sodium: 133 mmol/L — ABNORMAL LOW (ref 135–145)
TCO2: 38 mmol/L — ABNORMAL HIGH (ref 22–32)
pCO2, Ven: 25.3 mmHg — ABNORMAL LOW (ref 44.0–60.0)
pH, Ven: 7.773 (ref 7.250–7.430)
pO2, Ven: 167 mmHg — ABNORMAL HIGH (ref 32.0–45.0)

## 2020-12-02 LAB — URINALYSIS, ROUTINE W REFLEX MICROSCOPIC
Bacteria, UA: NONE SEEN
Bilirubin Urine: NEGATIVE
Glucose, UA: >=500 mg/dL — AB
Hgb urine dipstick: NEGATIVE
Ketones, ur: NEGATIVE mg/dL
Leukocytes,Ua: NEGATIVE
Nitrite: NEGATIVE
Protein, ur: 30 mg/dL — AB
Specific Gravity, Urine: 1.017 (ref 1.005–1.030)
pH: 7 (ref 5.0–8.0)

## 2020-12-02 LAB — CBC
HCT: 37.7 % — ABNORMAL LOW (ref 39.0–52.0)
Hemoglobin: 13.7 g/dL (ref 13.0–17.0)
MCH: 25.8 pg — ABNORMAL LOW (ref 26.0–34.0)
MCHC: 36.3 g/dL — ABNORMAL HIGH (ref 30.0–36.0)
MCV: 71.1 fL — ABNORMAL LOW (ref 80.0–100.0)
Platelets: 428 10*3/uL — ABNORMAL HIGH (ref 150–400)
RBC: 5.3 MIL/uL (ref 4.22–5.81)
RDW: 14.5 % (ref 11.5–15.5)
WBC: 9.9 10*3/uL (ref 4.0–10.5)
nRBC: 0 % (ref 0.0–0.2)

## 2020-12-02 LAB — APTT: aPTT: 29 seconds (ref 24–36)

## 2020-12-02 LAB — LIPASE, BLOOD: Lipase: 19 U/L (ref 11–51)

## 2020-12-02 LAB — BRAIN NATRIURETIC PEPTIDE: B Natriuretic Peptide: 137 pg/mL — ABNORMAL HIGH (ref 0.0–100.0)

## 2020-12-02 LAB — TROPONIN I (HIGH SENSITIVITY): Troponin I (High Sensitivity): 28 ng/L — ABNORMAL HIGH (ref <18)

## 2020-12-02 LAB — PROTIME-INR
INR: 1.1 (ref 0.8–1.2)
Prothrombin Time: 14.2 seconds (ref 11.4–15.2)

## 2020-12-02 MED ORDER — POTASSIUM CHLORIDE CRYS ER 20 MEQ PO TBCR
40.0000 meq | EXTENDED_RELEASE_TABLET | ORAL | Status: AC
  Administered 2020-12-02 – 2020-12-03 (×2): 40 meq via ORAL
  Filled 2020-12-02 (×2): qty 2

## 2020-12-02 MED ORDER — LORAZEPAM 2 MG/ML IJ SOLN
1.0000 mg | Freq: Once | INTRAMUSCULAR | Status: AC
  Administered 2020-12-02: 1 mg via INTRAVENOUS
  Filled 2020-12-02: qty 1

## 2020-12-02 MED ORDER — POTASSIUM CHLORIDE 10 MEQ/100ML IV SOLN
10.0000 meq | INTRAVENOUS | Status: AC
  Administered 2020-12-02 – 2020-12-03 (×2): 10 meq via INTRAVENOUS
  Filled 2020-12-02 (×2): qty 100

## 2020-12-02 MED ORDER — OXYCODONE-ACETAMINOPHEN 5-325 MG PO TABS
1.0000 | ORAL_TABLET | ORAL | Status: AC | PRN
  Administered 2020-12-02 (×2): 1 via ORAL
  Filled 2020-12-02 (×2): qty 1

## 2020-12-02 MED ORDER — SODIUM CHLORIDE 0.9 % IV SOLN: INTRAVENOUS | Status: DC | PRN

## 2020-12-02 MED ORDER — PANTOPRAZOLE SODIUM 40 MG IV SOLR: 40.0000 mg | Freq: Two times a day (BID) | INTRAVENOUS | Status: DC

## 2020-12-02 MED ORDER — LACTATED RINGERS IV BOLUS
1000.0000 mL | Freq: Once | INTRAVENOUS | Status: AC
  Administered 2020-12-02: 1000 mL via INTRAVENOUS

## 2020-12-02 NOTE — ED Triage Notes (Addendum)
Pt presents to ED [facility]. Pt c/o hematemesis. Pt had heart transplant in march. Pt recently ran out of gastroparesis medication. Pt says he had possible covid exposure.  EMS VS -  178sbp HR - 145 4mg  zofran 100IVF 98% CBG - 584

## 2020-12-02 NOTE — H&P (Signed)
[name] ESP:[id number] DOB: Jan 08, 1996 DOA: 12/02/2020   PCP: [place], [place] [facility]   Outpatient Specialists:   CARDS: Dr.Devore, Rhodia Albright at [facility], [place], [place] at [facility], Zane Herald, NP Endocrine Dr.  Paticia Stack Patient arrived to ER on 12/02/20 at 1923 Referred by Attending Deno Etienne, DO   Patient coming from: home Lives With roomate    Chief Complaint:  Chief Complaint  Patient presents with  . Hematemesis    HPI: Manuel Baker is a 25 y.o. 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  Ran out of his gastroparesis medication. reports some red blood in the begging  and some coffee grounds He is also concerned that he may have had a Covid exposure at work. On presentation to emergency department heart rate 145 hypertensive and systolics 633 he was given Zofran Noted to have CBG of 584 Patient is on aspirin 81 mg a day 100.2 oraly Of note he has been having intermittent nausea and vomiting ever since July 2021 with episodes few times a month. Patient reports he was able to tolerate liquids but has not been able to take any solids or his immunosuppression medications He is worried about some shortness of breath he has been feeling in relation to possible Covid exposure. His last dose of antirejection medications was on the January 7 in the morning  Smokes Marijuana about 2 g a day  Last time had insulin was Friday night  Infectious risk factors:  Reports  N/V no sore throat  cough Has been vaccinated  against COVID and boosted   Initial COVID TEST  NEGATIVE   Lab Results  Component Value Date   SARSCOV2NAA NEGATIVE 12/02/2020   [place] NEGATIVE 10/13/2020   [place] Not Detected 07/07/2019   [place] Not Detected 06/09/2019    Regarding pertinent Chronic problems:       HTN on Norvasc, Cardura  Hx of Seizures - Vimpat last time unsure when he had a seizure  Sp cardiac transplant - on Mycophenolate, prednisone, tacrolimus 79m q AM BActrim DS for prophylaxis last episode of acute rejection 02/18/2020 - 03/16/2020    GERD - protonix    DM 1 -  Lab Results  Component Value Date   HGBA1C 12.2 (H) 01/03/2018   on insulin,     Hx of CVA -  with/out residual deficits on Aspirin 81 mg,    While in ER: Noted to have hyperglycemia AKI creatinine up to 2.51 CT abdomen chest and pelvis nonacute Initiated noted to be hypokalemic down to 2.9 which was replaced  [place]   [place] [facility] as well as obtain echogram if there is any evidence of rejection reduced EF will need [facility]  Hospitalist was called for admission for hyperglycemia in the setting of uncontrolled nausea and vomiting secondary to gastroparesis  The following Work up has been ordered so far:  Orders Placed This Encounter  Procedures  .  Resp Panel by RT-PCR (Flu A&B, Covid) Nasopharyngeal Swab  . Urine culture  . Blood culture (routine x 2)  . DG Chest Portable 1 View  . CT CHEST ABDOMEN PELVIS WO CONTRAST  . Lipase, blood  . Comprehensive metabolic panel  . CBC  . Urinalysis, Routine w reflex microscopic  . Lactic acid, plasma  . Protime-INR  . APTT  . Beta-hydroxybutyric acid  . CMV DNA, quantitative, PCR  . Brain natriuretic peptide  . Diet NPO time specified  . Check Rectal Temperature  . Cardiac monitoring  . Document  height and weight  . Assess and Document Glasgow Coma Scale  . Document vital signs within 1-hour of fluid bolus completion.  Notify provider of abnormal vital signs despite fluid resuscitation.  . Refer to Sidebar Report: Sepsis Bundle ED/IP  . Notify provider for difficulties obtaining IV access  . Initiate Carrier Fluid Protocol  . Consult for [place] Admission  ALL PATIENTS BEING ADMITTED/HAVING PROCEDURES NEED COVID-19 SCREENING  . Airborne and Contact precautions  . Pulse oximetry, continuous  . I-Stat venous blood gas, [facility] ED)  . EKG 12-Lead  . ED EKG  . Insert peripheral IV X 1    Following Medications were ordered in ER: Medications  potassium chloride SA (KLOR-CON) CR tablet 40 mEq (40 mEq Oral Given 12/02/20 2307)  potassium chloride 10 mEq in 100 mL IVPB (10 mEq Intravenous New Bag/Given 12/02/20 2307)  0.9 %  sodium chloride infusion (has no administration in time range)  oxyCODONE-acetaminophen (PERCOCET/ROXICET) 5-325 MG per tablet 1 tablet (1 tablet Oral Given 12/02/20 2241)  LORazepam (ATIVAN) injection 1 mg (1 mg Intravenous Given 12/02/20 2242)  lactated ringers bolus 1,000 mL (1,000 mLs Intravenous New Bag/Given 12/02/20 2307)     Significant initial  Findings: Abnormal Labs Reviewed  COMPREHENSIVE METABOLIC PANEL - Abnormal; Notable for the following components:      Result Value   Sodium 131 (*)    Potassium 2.9 (*)    Chloride 83 (*)    Glucose, Bld 572 (*)    BUN 26 (*)    Creatinine, Ser 2.51 (*)    GFR, Estimated 36 (*)    Anion gap 24 (*)    All other components within normal limits  CBC - Abnormal; Notable for the following components:   HCT 37.7 (*)    MCV 71.1 (*)    MCH 25.8 (*)    MCHC 36.3 (*)    Platelets 428 (*)    All other components within normal limits  URINALYSIS, ROUTINE W REFLEX MICROSCOPIC - Abnormal; Notable for the following components:   Glucose, UA >=500 (*)    Protein, ur 30 (*)    All other components within normal limits   LACTIC ACID, PLASMA - Abnormal; Notable for the following components:   Lactic Acid, Venous 3.9 (*)    All other components within normal limits  BRAIN NATRIURETIC PEPTIDE - Abnormal; Notable for the following components:   B Natriuretic Peptide 137.0 (*)    All other components within normal limits  I-STAT VENOUS BLOOD GAS, ED - Abnormal; Notable for the following components:   pH, Ven 7.773 (*)    pCO2, Ven 25.3 (*)    pO2, Ven 167.0 (*)    Bicarbonate 37.1 (*)    TCO2 38 (*)    Acid-Base Excess 18.0 (*)    Sodium 133 (*)    Potassium 2.8 (*)    Calcium, Ion 0.89 (*)    All  other components within normal limits  TROPONIN I (HIGH SENSITIVITY) - Abnormal; Notable for the following components:   Troponin I (High Sensitivity) 28 (*)    All other components within normal limits    Otherwise labs showing:   Recent Labs  Lab 12/02/20 2015 12/02/20 2255  NA 131* 133*  K 2.9* 2.8*  CO2 24  --   GLUCOSE 572*  --   BUN 26*  --   CREATININE 2.51*  --   CALCIUM 9.6  --     Cr    Up from baseline see below Lab Results  Component Value Date   CREATININE 2.51 (H) 12/02/2020   CREATININE 1.00 10/13/2020   CREATININE 0.85 10/13/2020    Recent Labs  Lab 12/02/20 2015  AST 31  ALT 26  ALKPHOS 97  BILITOT 1.0  PROT 8.0  ALBUMIN 4.5   Lab Results  Component Value Date   CALCIUM 9.6 12/02/2020    WBC      Component Value Date/Time   WBC 9.9 12/02/2020 2015   LYMPHSABS 0.6 (L) 10/13/2020 1036   MONOABS 0.5 10/13/2020 1036   EOSABS 0.0 10/13/2020 1036   BASOSABS 0.0 10/13/2020 1036    Plt: Lab Results  Component Value Date   PLT 428 (H) 12/02/2020     Lactic Acid, Venous    Component Value Date/Time   LATICACIDVEN 3.9 (HH) 12/02/2020 2212   Lactic Acid, Venous    Component Value Date/Time   LATICACIDVEN 1.9 12/03/2020 0130     Procalcitonin Ordered   COVID-19 Labs  No results for input(s): DDIMER, FERRITIN, LDH, CRP in the last 72 hours.  Lab  Results  Component Value Date   [place] NEGATIVE 12/02/2020   [place] NEGATIVE 10/13/2020   [place] Not Detected 07/07/2019   [place] Not Detected 06/09/2019     Venous  Blood Gas result:  pH 7.773  PCO2 25.3     HG/HCT stable,      Component Value Date/Time   HGB 14.6 12/02/2020 2255   HCT 43.0 12/02/2020 2255   MCV 71.1 (L) 12/02/2020 2015    Recent Labs  Lab 12/02/20 2015  LIPASE 19      Troponin 28  Cardiac Panel (last 3 results) No results for input(s): CKTOTAL, CKMB, TROPONINI, RELINDX in the last 72 hours.     ECG: Ordered Personally reviewed by me showing: HR : 142 Rhythm:  Sinus tachycardia   no evidence of ischemic changes QTC 538   BNP (last 3 results) Recent Labs    10/13/20 1100  BNP 93.7    DM  labs:  HbA1C: No results for input(s): HGBA1C in the last 8760 hours.     CBG (last 3)  Recent Labs    12/03/20 0137 12/03/20 0219  GLUCAP 416* 480*       UA    no evidence of UTI    Urine analysis:    Component Value Date/Time   COLORURINE YELLOW 12/02/2020 2028   APPEARANCEUR CLEAR 12/02/2020 2028   LABSPEC 1.017 12/02/2020 2028   PHURINE 7.0 12/02/2020 2028   GLUCOSEU >=500 (A) 12/02/2020 2028   HGBUR NEGATIVE 12/02/2020 2028   BILIRUBINUR NEGATIVE 12/02/2020 2028   [place] NEGATIVE 12/02/2020 2028   PROTEINUR 30 (A) 12/02/2020 2028   NITRITE NEGATIVE 12/02/2020 2028   LEUKOCYTESUR NEGATIVE 12/02/2020 2028    Ordered   CXR -  NON acute  CTabd/pelvis - Marked circumferential thickening of the distal 2/3 of the thoracic esophagus, suggestive of  esophagitis.   ED Triage Vitals  Enc Vitals Group     BP 12/02/20 1945 100/64     Pulse Rate 12/02/20 1945 (!) 145     Resp 12/02/20 1945 18     Temp 12/02/20 1945 100.2 F (37.9 C)     Temp Source 12/02/20 1945 Oral     SpO2 12/02/20 1945 100 %     Weight --      Height --      Head Circumference --      Peak Flow --      Pain Score 12/02/20 1939 0     Pain Loc  --      Pain Edu? --      Excl. in [place]? --   TMAX(24)@       Latest  Blood pressure (!) 147/93, pulse 99, temperature 100.2 F (37.9 C), temperature source Oral, resp. rate 17, SpO2 100 %.   Review of Systems:    Pertinent positives include: fatigue indigestion, abdominal pain, nausea, vomiting,  chest pain, non-productive cough,  hematemesis Constitutional:  No weight loss, night sweats, Fevers, chills,  weight loss  HEENT:  No headaches, Difficulty swallowing,Tooth/dental problems,Sore throat,  No sneezing, itching, ear ache, nasal congestion, post nasal drip,  Cardio-vascular:  NoOrthopnea, PND, anasarca, dizziness, palpitations.no Bilateral lower extremity swelling  GI:  No heartburn,  diarrhea, change in bowel habits, loss of appetite, melena, blood in stool,  Resp:  no shortness of breath at rest. No dyspnea on exertion, No excess mucus, no productive cough, No No coughing up of blood.No change in color of mucus.No wheezing. Skin:  no rash or lesions. No jaundice GU:  no dysuria, change in color of urine, no urgency or frequency. No straining to urinate.  No flank pain.  Musculoskeletal:  No joint pain or no joint swelling. No decreased range of motion. No back pain.  Psych:  No change in mood or affect. No depression or anxiety. No memory loss.  Neuro: no localizing neurological complaints, no tingling, no weakness, no double vision, no gait abnormality, no slurred speech, no confusion  All systems reviewed and apart from [place] all are negative  Past Medical History:   Past Medical History:  Diagnosis Date  . Congenital heart disease   . Diabetes mellitus without complication ([place])   . Seizures ([place])      Past Surgical History:  Procedure Laterality Date  . open heart surgery    . TEE WITHOUT CARDIOVERSION N/A 01/06/2018   Procedure: TRANSESOPHAGEAL ECHOCARDIOGRAM (TEE);  Surgeon: Josue Hector, MD;  [facility] END[facility]COPY;  Service: Cardiovascular;   Laterality: N/A;    Social History:  Ambulatory   independently    reports that he has never smoked. He has never used smokeless tobacco. He reports current alcohol use. He reports current drug use. Drug: Marijuana.  Family History:  Family History  Problem Relation Age of Onset  . Diabetes Mellitus I Sister     Allergies: Allergies  Allergen Reactions  . Ibuprofen     Told not to take ibuprofen after cardiac surgery  . Nsaids Other (See Comments)    Unknown reaction    Prior to Admission medications   Medication Sig Start Date End Date Taking? Authorizing Provider  acetaminophen (TYLENOL) 500 MG tablet Take 2 tablets (1,000 mg total) by mouth every 8 (eight) hours as needed for fever. Patient not taking: Reported on 06/16/2020 01/14/19   McDonald, Maree Erie A, PA-C  acetaminophen (TYLENOL) 500 MG tablet  Take 500 mg by mouth every 6 (six) hours as needed for mild pain.    [provider]  amLODipine (NORVASC) 5 MG tablet Take 5 mg by mouth daily.  10/01/20 10/01/21  [provider]  ASPIRIN LOW DOSE 81 MG EC tablet Take 81 mg by mouth daily. 09/02/20   [provider]  doxazosin (CARDURA) 2 MG tablet Take 1 mg by mouth at bedtime.  10/01/20 10/01/21  [provider]  insulin degludec (TRESIBA FLEXTOUCH) 100 UNIT/ML FlexTouch Pen Inject 14 Units into the skin at bedtime.  08/04/20   [provider]  insulin glulisine (APIDRA SOLOSTAR) 100 UNIT/ML Solostar Pen Inject 0-45 Units into the skin See admin instructions. total daily dose up to 45 units  with meals and snacks 08/04/20   [provider]  Insulin Syringe-Needle U-100 (INSULIN SYRINGE .3CC/31GX5/16") 31G X 5/16" 0.3 ML MISC 1 Act by Does not apply route 4 (four) times daily -  before meals and at bedtime. 01/09/18   Lavina Hamman, MD  lidocaine (XYLOCAINE) 2 % solution Use as directed 15 mLs in the mouth or throat as needed for mouth pain. 06/16/20   Fatima Blank, MD   oseltamivir (TAMIFLU) 75 MG capsule Take 1 capsule (75 mg total) by mouth every 12 (twelve) hours. Patient not taking: No sig reported 01/14/19   McDonald, Mia A, PA-C  pantoprazole (PROTONIX) 40 MG tablet 40 mg daily.  09/01/20 09/01/21  [provider]  pravastatin (PRAVACHOL) 40 MG tablet Take 40 mg by mouth daily. 09/02/20   [provider]  predniSONE (DELTASONE) 5 MG tablet Take 5 mg by mouth daily with breakfast.  08/04/20   [provider]  promethazine-dextromethorphan (PROMETHAZINE-DM) 6.25-15 MG/5ML syrup Take 5 mLs by mouth 4 (four) times daily as needed for cough. Patient not taking: Reported on 06/16/2020 01/14/19   McDonald, Maree Erie A, PA-C  sertraline (ZOLOFT) 25 MG tablet Take 25 mg by mouth daily. 09/06/20   [provider]  sildenafil (VIAGRA) 25 MG tablet Take by mouth. 09/14/20 10/14/20  [provider]  VIMPAT 50 MG TABS tablet Take 50 mg by mouth 2 (two) times daily. 08/04/20   [provider]   Physical Exam: Vitals with BMI 12/02/2020 12/02/2020 12/02/2020  Height - - -  Weight - - -  BMI - - -  Systolic 893 734 287  Diastolic 93 681 64  Pulse 99 140 145    1. General:  in No  Acute distress    Chronically ill -appearing 2. Psychological: Alert and  Oriented 3. Head/ENT:   Dry Mucous Membranes                          Head Non traumatic, neck supple                            Poor Dentition 4. SKIN:   decreased Skin turgor,  Skin clean Dry and intact no rash 5. Heart: Regular rate and rhythm no Murmur, no Rub or gallop 6. Lungs:  no wheezes or crackles   7. Abdomen: Soft,  non-tender, Non distended bowel sounds present 8. Lower extremities: no clubbing, cyanosis, no  edema 9. Neurologically  strength 5 out of 5 in all 4 extremities cranial nerves II through XII intact 10. MSK: Normal range of motion   All other LABS:     Recent Labs  Lab 12/02/20  2015 12/02/20 2255  WBC 9.9  --   HGB 13.7 14.6  HCT 37.7* 43.0   MCV 71.1*  --   PLT 428*  --      Recent Labs  Lab 12/02/20 2015 12/02/20 2255  NA 131* 133*  K 2.9* 2.8*  CL 83*  --   CO2 24  --   GLUCOSE 572*  --   BUN 26*  --   CREATININE 2.51*  --   CALCIUM 9.6  --      Recent Labs  Lab 12/02/20 2015  AST 31  ALT 26  ALKPHOS 97  BILITOT 1.0  PROT 8.0  ALBUMIN 4.5       Cultures:    Component Value Date/Time   SDES IN/OUT CATH URINE 10/13/2020 1134   [place] 10/13/2020 1134   CULT  10/13/2020 1134    NO GROWTH Performed at [facility], [place] [address] [place], [place] [address]    REPTSTATUS 10/14/2020 FINAL 10/13/2020 1134     Radiological Exams on Admission: DG Chest Portable 1 View  Result Date: 12/02/2020 CLINICAL DATA:  Vomiting.  History of heart transplant. EXAM: PORTABLE CHEST 1 VIEW COMPARISON:  October 13, 2020 FINDINGS: The heart size and mediastinal contours are within normal limits. Both lungs are clear. The visualized skeletal structures are unremarkable. The patient is status post prior median sternotomy. IMPRESSION: No active disease. Electronically Signed   By: Constance Holster M.D.   On: 12/02/2020 22:03   CT CHEST ABDOMEN PELVIS WO CONTRAST  Result Date: 12/02/2020 CLINICAL DATA:  Cardiac transplant in March, recently ran out of gastroparesis medication, chest pain following emesis, concern for esophageal injury EXAM: CT CHEST, ABDOMEN AND PELVIS WITHOUT CONTRAST TECHNIQUE: Multidetector CT imaging of the chest, abdomen and pelvis was performed following the standard protocol without IV contrast. COMPARISON:  Radiograph 12/02/2020, MR thoracic spine 08/28/2018, CT thoracic spine 08/27/2018 FINDINGS: CT CHEST FINDINGS Cardiovascular: Cardiovascular evaluation limited in the absence of contrast media. There are postsurgical changes from prior sternotomy and orthotopic cardiac transplantation. No gross complications are evident within the limitations of this unenhanced CT. Normal cardiac size.  No pericardial effusion. Postsurgical changes along the normal caliber aorta. Incidental note made of the left vertebral artery arising directly from the aortic arch between the origins of the left common carotid and subclavian arteries. Aorta is otherwise unremarkable without periaortic stranding fluid. No major venous abnormalities are seen. Central pulmonary arteries are normal caliber. Luminal evaluation precluded in the absence of contrast media. Mediastinum/Nodes: There is marked circumferential thickening of the distal 2/3 of the thoracic esophagus. Minimal fluid within the esophageal lumen. No extraluminal gas or free fluid is evident however. No acute abnormality of the trachea. Thyroid gland and thoracic inlet are unremarkable. Postsurgical changes anterior mediastinum related to prior sternotomy. No worrisome mediastinal or axillary adenopathy. Hilar nodal evaluation is limited in the absence of intravenous contrast media. Lungs/Pleura: No consolidation, features of edema, pneumothorax, or effusion. No suspicious pulmonary nodules or masses. Musculoskeletal: Postsurgical changes from prior sternotomy with reinforced sternal sutures which appear intact and aligned as well as remote appearing deformities of several anterior ribs. Anterior wedging compression deformity T12 with up to 20% height loss anteriorly is new from prior. More remote marked vertebral body height loss at T4 with unchanged from prior. Additional milder biconvex deformities T2, T3, T7 and superior endplate deformities at T5 and T6, not significantly changed from comparison imaging. CT ABDOMEN PELVIS FINDINGS Hepatobiliary: No visible focal liver  lesion with limitations of this unenhanced CT. Smooth liver surface contour. Normal liver attenuation. Normal gallbladder and biliary tree. Pancreas: No pancreatic ductal dilatation or surrounding inflammatory changes. Spleen: Normal in size. No concerning splenic lesions. Adrenals/Urinary Tract:  Normal adrenal glands. Kidneys are normally located with symmetric enhancement and excretion. No suspicious renal lesion, urolithiasis or hydronephrosis. Urinary bladder is unremarkable for the degree of distention. Stomach/Bowel: Distal esophageal thickening, as above. Fluid-filled appearance of the stomach. Duodenum is unremarkable. No small bowel thickening or dilatation. Air-filled appendix in the right lower quadrant. No colonic dilatation or wall thickening. Vascular/Lymphatic: Limited assessment given absence of contrast media. No significant vascular findings are present. No enlarged abdominal or pelvic lymph nodes. Reproductive: The prostate and seminal vesicles are unremarkable. Other: No abdominopelvic free fluid or free gas. No bowel containing hernias. Musculoskeletal: Mild levocurvature of the lumbar spine, apex L3. No acute osseous abnormality or suspicious osseous lesion. IMPRESSION: 1. Marked circumferential thickening of the distal 2/3 of the thoracic esophagus, suggestive of esophagitis. No extraluminal gas or free fluid to suggest frank perforation. Consider further evaluation with endoscopy as clinically indicated. 2. Postsurgical changes from prior sternotomy and orthotopic cardiac transplantation. No gross complications are evident within the limitations of this unenhanced CT. 3. Anterior wedging compression deformity T12 with up to 20% height loss anteriorly, new from prior. Additional remote compression deformities T2-T7, not significantly changed from comparison imaging. Electronically Signed   By: Lovena Le M.D.   On: 12/02/2020 22:35    Chart has been reviewed    Assessment/Plan  25 y.o. male with medical history significant of Cardiac transplant in [facility] 2021 with hx of Ebstein's anomaly, HTN  Dm1, gastroparesis secondary to diabetes, depression, neutropenia, chronic abdominal pain History of Covid, history of stroke history of acute rejection resulting in V. fib arrest in  March 2021, Velta Addison Sacks endocarditis  History of ASD repair 25 years old and MRA   Admitted for Hyperglycemia, AKI, esophagitis  Present on Admission: . Hyperglycemia - will admit per hyperglycemia protocol, obtain serial BMET, start on glucosestabalizer, aggressive IVF.   Change IVF to D5 1/2Na after BG <250 if not tolerating PO .  No evidence of elevated Ketones Order beta-hydrohybutyric acid No evidence of acidosis on VBG  Most likely cause been noncompliance And intractable nausea and vomiting Monitor in Stepdown. Replace potassium as needed.    Consult diabetes coordinator  Sepsis   -SIRS criteria met with   tachycardia   ,   RR >20 temp 100.2 Today's Vitals   12/02/20 2145 12/02/20 2315 12/03/20 0113 12/03/20 0230  BP: (!) 147/93 (!) 133/113  (!) 141/103  Pulse: 99 (!) 124  100  Resp: 17 (!) 23  16  Temp:      TempSrc:      SpO2: 100% 100%  100%  Weight:   52.2 kg   Height:   '5\' 7"'  (1.702 m)   PainSc:         -Most likely source being: Source of sepsis is unknown but given clinical picture will continue to treat for tonight   Patient meeting criteria for Severe sepsis with    evidence of end organ damage/organ dysfunction such as   Acute Kidney Injury with Cr > 2,  Lab Results  Component Value Date   CREATININE 2.51 (H) 12/02/2020   CREATININE 1.00 10/13/2020    elevated lactic acid >2      - Obtain serial lactic acid and procalcitonin level.  - Initiated IV antibiotics   -  await results of blood and urine culture  - Rehydrate aggressively      30cc/kg fluid   2:50 AM   Hx of cardiac transplant - [facility] at [facility], ER provider has discussed case with [facility]: echo in AM if evidence of rejection will need transfer to [place] Work up for CMV Check Tacrolimus level in AM  if unanble to tolerate PO steroids change to IV Restart tacrolimus Restart Mycophenolate (720 mg total) by mouth every 12 (twelve) hours  If no evidence of severe infection otherwise would  hold Please contact [facility] transplant team in AM  . Uncontrolled type 1 diabetes mellitus with hyperglycemia (HCC) -on insulin drip  . Esophagitis -on protonix drip, test for CMV,   Hematemesis -  - Glasgow [place] score BUN >18.2  HR >100  >1 Justifies admission and aggressive management     Modifying risk factors include:    NSAIDS use steroid use   sent msg to [facility] Dr. Hilarie Fredrickson to let them know patient has been admitted may need further work up   - serial CBC.    - Monitor for any recurrence,  evidence of hemodynamic instability or significant blood loss  - Transfuse as needed for hemoglobin below 7 or evidence of life-threatening bleeding Currently stable  - Establish at least 2 PIV and fluid resuscitate   - clear liquids for tonight keep nothing by mouth post midnight,   -  administer Protonix drip    . Elevated lactic acid level -likely due to dehydration Improved with IVF  . Diabetic gastroparesis ([place]) - vs Cannabinoid induced hyperemesis  . Dehydration -will rehydrate  hx of seizure - continue Vimpat   Other plan as per orders.  DVT prophylaxis:  SCD       Code Status:    Code Status: Prior FULL CODE as per patient   I had personally discussed CODE STATUS with patient   Family Communication:   Family not at  Bedside   Disposition Plan:   To home once workup is complete and patient is stable   Following barriers for discharge:                            Electrolytes corrected                                Pain controlled with PO medications                               Afebrile, white count improving able to transition to PO antibiotics                             Will need to be able to tolerate PO                            Will likely need home health, home O2, set up                           Will need consultants to evaluate patient prior to discharge  Diabetes care coordinator                     Consults called: [facility] is  aware   Admission status:  ED Disposition    ED Disposition Condition [place]: [place] [[id number]]  Level of Care: Progressive [102]  Admit to Progressive based on following criteria: CARDIOVASCULAR & THORACIC of moderate stability with acute coronary syndrome symptoms/low risk myocardial infarction/hypertensive urgency/arrhythmias/heart failure potentially compromising stability and stable post cardiovascular intervention patients.  Admit to Progressive based on following criteria: MULTISYSTEM THREATS such as stable sepsis, metabolic/electrolyte imbalance with or without encephalopathy that is responding to early treatment.  Admit to Progressive based on following criteria: GI, ENDOCRINE disease patients with GI bleeding, acute liver failure or pancreatitis, stable with diabetic ketoacidosis or thyrotoxicosis (hypothyroid) state.  May admit patient to [facility] or [facility] if equivalent level of care is available:: No  Covid Evaluation: Confirmed COVID Negative  Diagnosis: Hyperglycemia [161096]  Admitting Physician: Toy Baker [[id number]]  Attending Physician: Toy Baker [[id number]]  Estimated length of stay: past midnight tomorrow  Certification:: I certify this patient will need inpatient services for at least 2 midnights         inpatient     I Expect 2 midnight stay secondary to severity of patient's current illness need for inpatient interventions justified by the following:  hemodynamic instability despite optimal treatment (tachycardia  )   Severe lab/radiological/exam abnormalities including:    hyperglycemia and extensive comorbidities including:  DM1   history of stroke with residual deficits .    That are currently affecting medical management.   I expect  patient to be hospitalized for 2 midnights requiring inpatient medical care.  Patient is at high risk for adverse outcome (such as loss of life or disability) if  not treated.  Indication for inpatient stay as follows:    Hemodynamic instability despite maximal medical therapy,    severe pain requiring acute inpatient management,  inability to maintain oral hydration     Need for  IV fluids, IV  insulin   Level of care    SDU tele indefinitely please discontinue once patient no longer qualifies COVID-19 Labs    Lab Results  Component Value Date   [place] NEGATIVE 12/02/2020     Precautions: admitted as Covid Negative  PPE: Used by the provider:  P100  eye Goggles,  Gloves   Allycia Pitz 12/03/2020, 3:04 AM    [facility]     after 2 AM please page floor coverage PA If 7AM-7PM, please contact the day team taking care of the patient using [email or web address]   Patient was evaluated in the context of the global COVID-19 pandemic, which necessitated consideration that the patient might be at risk for infection with the SARS-CoV-2 virus that causes COVID-19. Institutional protocols and algorithms that pertain to the evaluation of patients at risk for COVID-19 are in a state of rapid change based on information released by regulatory bodies including the CDC and federal and state organizations. These policies and algorithms were followed during the patient's care.

## 2020-12-02 NOTE — ED Provider Notes (Signed)
[facility] EMERGENCY DEPARTMENT Provider Note   CSN: [id number] Arrival date & time: 12/02/20  1923     History Chief Complaint  Patient presents with  . Hematemesis    Manuel Baker is a 25 y.o. male.  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  Patient states that he has been having problems with frequent abdominal pain, nausea/vomiting since July 2021.  Episodes occur approximately twice a month.  However, this morning around 2 AM he had onset of vomiting with subsequent onset of severe chest and abdominal pain.  Pain is located in the central chest from the clavicles down to the epigastric abdomen.  Nonradiating, currently a 7-8/10 in intensity.  He has run out of his home Reglan and so has not been taking anything for his symptoms.  He has been drinking liquids throughout the day, but has not been able to tolerate solid food.  He also complains of shortness of breath.  He notes he had a recent possible Covid exposure as someone tested positive in his workplace, but this was reported anonymously and he is not sure how closely he may have been exposed.  Due to his vomiting, he has missed his antirejection medications last night and this morning.  The last time he took his mycophenolate or tacrolimus was yesterday morning. When asked about thrush symptoms, pt states he has occasionally noticed a white residue in his mouth recently.    Past Medical History:  Diagnosis Date  . Congenital heart disease   . Diabetes mellitus without complication (HCC)   . Seizures [facility])     Patient Active Problem List   Diagnosis Date Noted  . Septic embolism (HCC)   . Libman Sacks endocarditis (HCC)   . Leucocytosis   . [place] endocarditis   . 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 Uncontrolled type 1 diabetes mellitus with hyperglycemia (HCC) 01/03/2018  . Seizure (HCC) 01/03/2018  . Stroke (cerebrum) (HCC) 01/03/2018  . Stroke (HCC) 01/03/2018  . Ebstein's anomaly     Past Surgical History:  Procedure Laterality Date  . open heart surgery    . TEE WITHOUT CARDIOVERSION N/A 01/06/2018   Procedure: TRANSESOPHAGEAL ECHOCARDIOGRAM (TEE);  Surgeon: Wendall Stade, MD;  Location: [facility] ENDOSCOPY;  Service: Cardiovascular;  Laterality: N/A;       Family History  Problem Relation Age of Onset  . Diabetes Mellitus I Sister     Social History   Tobacco Use  . Smoking status: Never Smoker  . Smokeless tobacco: Never Used  Vaping Use  . Vaping Use: Never used  Substance Use Topics  . Alcohol use: Yes    Comment: social  . Drug use: Yes    Types: Marijuana    Home Medications Prior to Admission medications   Medication Sig Start Date End Date Taking? Authorizing Provider  acetaminophen (TYLENOL) 500 MG tablet Take 2 tablets (1,000 mg total) by mouth every 8 (eight) hours as needed for fever. Patient not taking: Reported on 06/16/2020 01/14/19   McDonald, Pedro Earls A, PA-C  acetaminophen (TYLENOL) 500 MG tablet Take 500 mg by mouth every 6 (six) hours as needed for mild pain.    [provider]  amLODipine (NORVASC) 5 MG tablet Take 5 mg by  mouth daily.  10/01/20 10/01/21  [provider]  ASPIRIN LOW DOSE 81 MG EC tablet Take 81 mg by mouth daily. 09/02/20   [provider]  doxazosin (CARDURA) 2 MG tablet Take 1 mg by mouth at bedtime.  10/01/20 10/01/21  [provider]  insulin degludec (TRESIBA FLEXTOUCH) 100 UNIT/ML FlexTouch Pen Inject 14 Units into the skin at bedtime.  08/04/20   [provider]  insulin glulisine (APIDRA SOLOSTAR) 100 UNIT/ML Solostar Pen Inject 0-45 Units into the skin See admin instructions. total daily dose up to 45  units  with meals and snacks 08/04/20   [provider]  Insulin Syringe-Needle U-100 (INSULIN SYRINGE .3CC/31GX5/16") 31G X 5/16" 0.3 ML MISC 1 Act by Does not apply route 4 (four) times daily -  before meals and at bedtime. 01/09/18   Rolly Salter, MD  lidocaine (XYLOCAINE) 2 % solution Use as directed 15 mLs in the mouth or throat as needed for mouth pain. 06/16/20   Nira Conn, MD  oseltamivir (TAMIFLU) 75 MG capsule Take 1 capsule (75 mg total) by mouth every 12 (twelve) hours. Patient not taking: No sig reported 01/14/19   McDonald, Mia A, PA-C  pantoprazole (PROTONIX) 40 MG tablet 40 mg daily.  09/01/20 09/01/21  [provider]  pravastatin (PRAVACHOL) 40 MG tablet Take 40 mg by mouth daily. 09/02/20   [provider]  predniSONE (DELTASONE) 5 MG tablet Take 5 mg by mouth daily with breakfast.  08/04/20   [provider]  promethazine-dextromethorphan (PROMETHAZINE-DM) 6.25-15 MG/5ML syrup Take 5 mLs by mouth 4 (four) times daily as needed for cough. Patient not taking: Reported on 06/16/2020 01/14/19   McDonald, Pedro Earls A, PA-C  sertraline (ZOLOFT) 25 MG tablet Take 25 mg by mouth daily. 09/06/20   [provider]  sildenafil (VIAGRA) 25 MG tablet Take by mouth. 09/14/20 10/14/20  [provider]  VIMPAT 50 MG TABS tablet Take 50 mg by mouth 2 (two) times daily. 08/04/20   [provider]    Allergies    Ibuprofen and Nsaids  Review of Systems   Review of Systems  Constitutional: Negative for chills and fever.  HENT: Positive for sore throat. Negative for ear pain.   Eyes: Negative for pain and visual disturbance.  Respiratory: Positive for cough and shortness of breath. Negative for wheezing.   Cardiovascular: Negative for chest pain and palpitations.  Gastrointestinal: Positive for abdominal pain, nausea and vomiting.  Genitourinary: Negative for dysuria and hematuria.  Musculoskeletal: Negative for gait problem and  joint swelling.  Skin: Negative for color change and rash.  Neurological: Negative for syncope and headaches.  Psychiatric/Behavioral: Negative for confusion and suicidal ideas.    Physical Exam Updated Vital Signs BP (!) 147/93   Pulse 99   Temp 100.2 F (37.9 C) (Oral)   Resp 17   SpO2 100%   Physical Exam Vitals and nursing note reviewed.  Constitutional:      General: He is not in acute distress.    Appearance: He is well-developed and well-nourished. He is ill-appearing. He is not toxic-appearing.  HENT:     Head: Normocephalic and atraumatic.     Nose: Nose normal.     Mouth/Throat:     Mouth: Mucous membranes are moist.     Pharynx: Oropharynx is clear.  Eyes:     General: No scleral icterus. Cardiovascular:     Rate and Rhythm: Regular rhythm. Tachycardia present.     Pulses: Normal  pulses.     Heart sounds: No murmur heard. No friction rub. No gallop.   Pulmonary:     Effort: Pulmonary effort is normal. No respiratory distress.     Breath sounds: Normal breath sounds. No wheezing, rhonchi or rales.  Abdominal:     Palpations: Abdomen is soft.     Tenderness: There is abdominal tenderness (Epigastric). There is no guarding or rebound.  Musculoskeletal:        General: No edema.     Cervical back: Neck supple.     Right lower leg: No edema.     Left lower leg: No edema.  Skin:    General: Skin is warm and dry.  Neurological:     Mental Status: He is alert.     Comments: Alert, grossly oriented, moves all extremities spontaneously  Psychiatric:        Mood and Affect: Mood and affect normal.        Behavior: Behavior normal.     ED Results / Procedures / Treatments   Labs (all labs ordered are listed, but only abnormal results are displayed) Labs Reviewed  COMPREHENSIVE METABOLIC PANEL - Abnormal; Notable for the following components:      Result Value   Sodium 131 (*)    Potassium 2.9 (*)    Chloride 83 (*)    Glucose, Bld 572 (*)    BUN 26 (*)     Creatinine, Ser 2.51 (*)    GFR, Estimated 36 (*)    Anion gap 24 (*)    All other components within normal limits  CBC - Abnormal; Notable for the following components:   HCT 37.7 (*)    MCV 71.1 (*)    MCH 25.8 (*)    MCHC 36.3 (*)    Platelets 428 (*)    All other components within normal limits  URINALYSIS, ROUTINE W REFLEX MICROSCOPIC - Abnormal; Notable for the following components:   Glucose, UA >=500 (*)    Protein, ur 30 (*)    All other components within normal limits  LACTIC ACID, PLASMA - Abnormal; Notable for the following components:   Lactic Acid, Venous 3.9 (*)    All other components within normal limits  BRAIN NATRIURETIC PEPTIDE - Abnormal; Notable for the following components:   B Natriuretic Peptide 137.0 (*)    All other components within normal limits  PHOSPHORUS - Abnormal; Notable for the following components:   Phosphorus 5.2 (*)    All other components within normal limits  BASIC METABOLIC PANEL - Abnormal; Notable for the following components:   Sodium 134 (*)    Chloride 88 (*)    Glucose, Bld 442 (*)    BUN 23 (*)    Creatinine, Ser 1.97 (*)    Calcium 8.3 (*)    GFR, Estimated 48 (*)    Anion gap 18 (*)    All other components within normal limits  I-STAT VENOUS BLOOD GAS, ED - Abnormal; Notable for the following components:   pH, Ven 7.773 (*)    pCO2, Ven 25.3 (*)    pO2, Ven 167.0 (*)    Bicarbonate 37.1 (*)    TCO2 38 (*)    Acid-Base Excess 18.0 (*)    Sodium 133 (*)    Potassium 2.8 (*)    Calcium, Ion 0.89 (*)    All other components within normal limits  CBG MONITORING, ED - Abnormal; Notable for the following components:   Glucose-Capillary 416 (*)  All other components within normal limits  CBG MONITORING, ED - Abnormal; Notable for the following components:   Glucose-Capillary 480 (*)    All other components within normal limits  CBG MONITORING, ED - Abnormal; Notable for the following components:   Glucose-Capillary  474 (*)    All other components within normal limits  TROPONIN I (HIGH SENSITIVITY) - Abnormal; Notable for the following components:   Troponin I (High Sensitivity) 28 (*)    All other components within normal limits  TROPONIN I (HIGH SENSITIVITY) - Abnormal; Notable for the following components:   Troponin I (High Sensitivity) 36 (*)    All other components within normal limits  RESP PANEL BY RT-PCR (FLU A&B, COVID) ARPGX2  URINE CULTURE  CULTURE, BLOOD (ROUTINE X 2)  CULTURE, BLOOD (ROUTINE X 2)  RESPIRATORY PANEL BY PCR  LIPASE, BLOOD  LACTIC ACID, PLASMA  PROTIME-INR  APTT  MAGNESIUM  CK  PROCALCITONIN  BETA-HYDROXYBUTYRIC ACID  CMV DNA, QUANTITATIVE, PCR  TACROLIMUS LEVEL  RAPID URINE DRUG SCREEN, HOSP PERFORMED  SODIUM, URINE, RANDOM  CREATININE, URINE, RANDOM  LACTIC ACID, PLASMA  LACTIC ACID, PLASMA  BASIC METABOLIC PANEL  BASIC METABOLIC PANEL  BASIC METABOLIC PANEL  HEMOGLOBIN A1C  BETA-HYDROXYBUTYRIC ACID  BETA-HYDROXYBUTYRIC ACID  BETA-HYDROXYBUTYRIC ACID  CBC  CBC  CBC  HIV ANTIBODY (ROUTINE TESTING W REFLEX)  CBC WITH DIFFERENTIAL/PLATELET  TSH  COMPREHENSIVE METABOLIC PANEL  TROPONIN I (HIGH SENSITIVITY)    EKG EKG Interpretation  Date/Time:  Saturday December 02 2020 19:39:31 EST Ventricular Rate:  142 PR Interval:  128 QRS Duration: 82 QT Interval:  350 QTC Calculation: 538 R Axis:   99 Text Interpretation: Suspect arm lead reversal, interpretation assumes no reversal Sinus tachycardia Rightward axis Nonspecific ST abnormality Abnormal ECG No significant change since last tracing Confirmed by Melene PlanFloyd, Dan [id number]) on 12/02/2020 8:48:24 PM   Radiology DG Chest Portable 1 View  Result Date: 12/02/2020 CLINICAL DATA:  Vomiting.  History of heart transplant. EXAM: PORTABLE CHEST 1 VIEW COMPARISON:  October 13, 2020 FINDINGS: The heart size and mediastinal contours are within normal limits. Both lungs are clear. The visualized skeletal structures  are unremarkable. The patient is status post prior median sternotomy. IMPRESSION: No active disease. Electronically Signed   By: Katherine Mantlehristopher  Green M.D.   On: 12/02/2020 22:03   CT CHEST ABDOMEN PELVIS WO CONTRAST  Result Date: 12/02/2020 CLINICAL DATA:  Cardiac transplant in March, recently ran out of gastroparesis medication, chest pain following emesis, concern for esophageal injury EXAM: CT CHEST, ABDOMEN AND PELVIS WITHOUT CONTRAST TECHNIQUE: Multidetector CT imaging of the chest, abdomen and pelvis was performed following the standard protocol without IV contrast. COMPARISON:  Radiograph 12/02/2020, MR thoracic spine 08/28/2018, CT thoracic spine 08/27/2018 FINDINGS: CT CHEST FINDINGS Cardiovascular: Cardiovascular evaluation limited in the absence of contrast media. There are postsurgical changes from prior sternotomy and orthotopic cardiac transplantation. No gross complications are evident within the limitations of this unenhanced CT. Normal cardiac size. No pericardial effusion. Postsurgical changes along the normal caliber aorta. Incidental note made of the left vertebral artery arising directly from the aortic arch between the origins of the left common carotid and subclavian arteries. Aorta is otherwise unremarkable without periaortic stranding fluid. No major venous abnormalities are seen. Central pulmonary arteries are normal caliber. Luminal evaluation precluded in the absence of contrast media. Mediastinum/Nodes: There is marked circumferential thickening of the distal 2/3 of the thoracic esophagus. Minimal fluid within the esophageal lumen. No extraluminal gas or free fluid  is evident however. No acute abnormality of the trachea. Thyroid gland and thoracic inlet are unremarkable. Postsurgical changes anterior mediastinum related to prior sternotomy. No worrisome mediastinal or axillary adenopathy. Hilar nodal evaluation is limited in the absence of intravenous contrast media. Lungs/Pleura: No  consolidation, features of edema, pneumothorax, or effusion. No suspicious pulmonary nodules or masses. Musculoskeletal: Postsurgical changes from prior sternotomy with reinforced sternal sutures which appear intact and aligned as well as remote appearing deformities of several anterior ribs. Anterior wedging compression deformity T12 with up to 20% height loss anteriorly is new from prior. More remote marked vertebral body height loss at T4 with unchanged from prior. Additional milder biconvex deformities T2, T3, T7 and superior endplate deformities at T5 and T6, not significantly changed from comparison imaging. CT ABDOMEN PELVIS FINDINGS Hepatobiliary: No visible focal liver lesion with limitations of this unenhanced CT. Smooth liver surface contour. Normal liver attenuation. Normal gallbladder and biliary tree. Pancreas: No pancreatic ductal dilatation or surrounding inflammatory changes. Spleen: Normal in size. No concerning splenic lesions. Adrenals/Urinary Tract: Normal adrenal glands. Kidneys are normally located with symmetric enhancement and excretion. No suspicious renal lesion, urolithiasis or hydronephrosis. Urinary bladder is unremarkable for the degree of distention. Stomach/Bowel: Distal esophageal thickening, as above. Fluid-filled appearance of the stomach. Duodenum is unremarkable. No small bowel thickening or dilatation. Air-filled appendix in the right lower quadrant. No colonic dilatation or wall thickening. Vascular/Lymphatic: Limited assessment given absence of contrast media. No significant vascular findings are present. No enlarged abdominal or pelvic lymph nodes. Reproductive: The prostate and seminal vesicles are unremarkable. Other: No abdominopelvic free fluid or free gas. No bowel containing hernias. Musculoskeletal: Mild levocurvature of the lumbar spine, apex L3. No acute osseous abnormality or suspicious osseous lesion. IMPRESSION: 1. Marked circumferential thickening of the distal  2/3 of the thoracic esophagus, suggestive of esophagitis. No extraluminal gas or free fluid to suggest frank perforation. Consider further evaluation with endoscopy as clinically indicated. 2. Postsurgical changes from prior sternotomy and orthotopic cardiac transplantation. No gross complications are evident within the limitations of this unenhanced CT. 3. Anterior wedging compression deformity T12 with up to 20% height loss anteriorly, new from prior. Additional remote compression deformities T2-T7, not significantly changed from comparison imaging. Electronically Signed   By: Kreg Shropshire M.D.   On: 12/02/2020 22:35    Procedures Procedures (including critical care time)  Medications Ordered in ED Medications  oxyCODONE-acetaminophen (PERCOCET/ROXICET) 5-325 MG per tablet 1 tablet (1 tablet Oral Given 12/02/20 2002)  LORazepam (ATIVAN) injection 1 mg (has no administration in time range)    ED Course  I have reviewed the triage vital signs and the nursing notes.  Pertinent labs & imaging results that were available during my care of the patient were reviewed by me and considered in my medical decision making (see chart for details).    MDM Rules/Calculators/A&P                          25 year old male with chest and abdominal pain, nausea/vomiting.  Patient is tachycardic and has elevated temp (100.71F) on arrival.  I am most concerned for GERD or esophagitis, but differential includes COVID-19 infection, esophageal injury/Boerhaave syndrome in this patient with recent vomiting and onset of chest pain with hematemesis following the vomiting, myocarditis, pancreatitis, exacerbation of gastroparesis, much less likely ACS.  1 L LR bolus and Percocet ordered.  1 mg IV Ativan given for nausea given prolonged QT interval on initial ECG.  Labs reviewed and notable for hyperglycemia with elevated anion gap but without evidence of DKA, mildly elevated troponin, AKI, hypokalemia, lactic acidosis.   COVID-19 and flu A/B are negative.  Lipase WNL. ECG shows Sinus tachycardia with a rate of 142.  PR 128, QRS 82, QTc 538.  Right axis deviation.  Nonspecific ST changes. Imaging interpreted by radiology and images personally reviewed.  CT chest/abdomen/pelvis shows circumferential esophageal thickening concerning for esophagitis.  No free air on CT or CXR to suggest esophageal perforation.  Impression is likely esophagitis.  However, patient also has AKI and I am concerned for possible additional infection or transplant rejection reaction.  Patient will require admission for further evaluation and management, including rehydration, an updated echocardiogram, pain control, and to follow blood cultures and CMV testing. Pt will be admitted to the hospitalist service. He remained in stable condition with improved HR at time of admission.  Final Clinical Impression(s) / ED Diagnoses Final diagnoses:  Non-intractable vomiting with nausea, unspecified vomiting type  Chest pain, unspecified type  Epigastric pain      Corliss BlackerBishop, Bane Hagy, MD 12/03/20 0413    Melene PlanFloyd, Dan, DO 12/03/20 1509

## 2020-12-02 NOTE — H&P (Incomplete)
Manuel Baker PPI:[id number] DOB: 12/21/1995 DOA: 12/02/2020     PCP: Demaris Callander [facility]   Outpatient Specialists: * NONE CARDS: * Dr. NEphrology: *  Dr. NEurology *   Dr. Pulmonary *  Dr.  Oncology * Dr. Sandria Manly* Dr.  Deboraha Sprang, [facility]) Urology Dr. *  Patient arrived to ER on 12/02/20 at 1923 Referred by Attending Melene Plan, DO   Patient coming from: home Lives alone,   *** With family    Chief Complaint:  Chief Complaint  Patient presents with  . Hematemesis    HPI: Manuel Baker is a 25 y.o. male with medical history significant of ***     Presented with   *  100.2 oraly  Infectious risk factors:  Reports  Fever,  N/V    Has been vaccinated against COVID and boosted   Initial COVID TEST  NEGATIVE   Lab Results  Component Value Date   SARSCOV2NAA NEGATIVE 12/02/2020   SARSCOV2NAA NEGATIVE 10/13/2020   SARSCOV2NAA Not Detected 07/07/2019   SARSCOV2NAA Not Detected 06/09/2019    Regarding pertinent Chronic problems: ***  ****Hyperlipidemia - *on statins Lipid Panel     Component Value Date/Time   CHOL 137 01/03/2018 0623   TRIG 97 01/03/2018 0623   HDL 49 01/03/2018 0623   CHOLHDL 2.8 01/03/2018 0623   VLDL 19 01/03/2018 0623   LDLCALC 69 01/03/2018 0623    ***HTN on   ***chronic CHF diastolic/systolic/ combined - last echo***  *** CAD  - On Aspirin, statin, betablocker, Plavix                 - *followed by cardiology                - last cardiac cath  The ASCVD Risk score Denman George DC Jr., et al., 2013) failed to calculate for the following reasons:   The 2013 ASCVD risk score is only valid for ages 36 to 65   The patient has a prior MI or stroke diagnosis    ***DM 2 -  Lab Results  Component Value Date   HGBA1C 12.2 (H) 01/03/2018   ****on insulin, PO meds only, diet controlled  ***Hypothyroidism: No results found for: TSH on synthroid  *** Morbid obesity-   BMI Readings from Last 1 Encounters:  10/13/20 18.01 kg/m      *** Asthma -well *** controlled on home inhalers/ nebs f                        ***last no prior***admission  ***                       No ***history of intubation  *** COPD - not **followed by pulmonology *** not  on baseline oxygen  *L,    *** OSA -on nocturnal oxygen, *CPAP, *noncompliant with CPAP  *** Hx of CVA - *with/out residual deficits on Aspirin 81 mg, 325, Plavix  ***A. Fib -  - CHA2DS2 vas score **** CHA2DS2/VAS Stroke Risk Points      N/A >= 2 Points: High Risk  1 - 1.99 Points: Medium Risk  0 Points: Low Risk    Last Change: N/A      This score determines the patient's risk of having a stroke if the  patient has atrial fibrillation.      This score is not applicable to this patient. Components are not  calculated.     current  on anticoagulation with ****Coumadin  ***Xarelto,* Eliquis,  *** Not on anticoagulation secondary to Risk of Falls, *** recurrent bleeding         -  Rate control:  Currently controlled with ***Toprolol,  *Metoprolol,* Diltiazem, *Coreg          - Rhythm control: *** amiodarone, *flecainide  ***CKD stage III*- baseline Cr **** CrCl cannot be calculated (Unknown ideal weight.).  Lab Results  Component Value Date   CREATININE 2.51 (H) 12/02/2020   CREATININE 1.00 10/13/2020   CREATININE 0.85 10/13/2020     **** Liver disease MELD-Na score: 6 at 10/13/2020  7:11 PM MELD score: 6 at 10/13/2020  7:11 PM Calculated from: Serum Creatinine: 1.00 mg/dL at 16/08/9603  5:40 PM Serum Sodium: 142 mmol/L (Using max of 137 mmol/L) at 10/13/2020  7:11 PM Total Bilirubin: 0.9 mg/dL (Using min of 1 mg/dL) at 98/09/9146 82:95 AM INR(ratio): 1.0 at 10/13/2020 11:03 AM Age: 27 years   ***BPH - on Flomax, Proscar    *** Dementia - on Aricept** Nemenda  *** Chronic anemia - baseline hg Hemoglobin & Hematocrit  Recent Labs    10/13/20 1911 12/02/20 2015 12/02/20 2255  HGB 12.2* 13.7 14.6      While in ER:   ER Provider Called:   [facility]  Dr.Paric  They Recommend admit to medicine *** Will see in AM*  in ER  Hospitalist was called for admission for ***  The following Work up has been ordered so far:  Orders Placed This Encounter  Procedures  . Resp Panel by RT-PCR (Flu A&B, Covid) Nasopharyngeal Swab  . Urine culture  . Blood culture (routine x 2)  . DG Chest Portable 1 View  . CT CHEST ABDOMEN PELVIS WO CONTRAST  . Lipase, blood  . Comprehensive metabolic panel  . CBC  . Urinalysis, Routine w reflex microscopic  . Lactic acid, plasma  . Protime-INR  . APTT  . Beta-hydroxybutyric acid  . CMV DNA, quantitative, PCR  . Brain natriuretic peptide  . Diet NPO time specified  . Check Rectal Temperature  . Cardiac monitoring  . Document height and weight  . Assess and Document Glasgow Coma Scale  . Document vital signs within 1-hour of fluid bolus completion.  Notify provider of abnormal vital signs despite fluid resuscitation.  . Refer to Sidebar Report: Sepsis Bundle ED/IP  . Notify provider for difficulties obtaining IV access  . Initiate Carrier Fluid Protocol  . Consult for Unassigned Medical Admission  ALL PATIENTS BEING ADMITTED/HAVING PROCEDURES NEED COVID-19 SCREENING  . Airborne and Contact precautions  . Pulse oximetry, continuous  . I-Stat venous blood gas, [facility] ED)  . EKG 12-Lead  . ED EKG  . Insert peripheral IV X 1      Following Medications were ordered in ER: Medications  potassium chloride SA (KLOR-CON) CR tablet 40 mEq (40 mEq Oral Given 12/02/20 2307)  potassium chloride 10 mEq in 100 mL IVPB (10 mEq Intravenous New Bag/Given 12/02/20 2307)  0.9 %  sodium chloride infusion (has no administration in time range)  oxyCODONE-acetaminophen (PERCOCET/ROXICET) 5-325 MG per tablet 1 tablet (1 tablet Oral Given 12/02/20 2241)  LORazepam (ATIVAN) injection 1 mg (1 mg Intravenous Given 12/02/20 2242)  lactated ringers bolus 1,000 mL (1,000 mLs Intravenous New Bag/Given 12/02/20 2307)          Significant initial  Findings: Abnormal Labs Reviewed  COMPREHENSIVE METABOLIC PANEL - Abnormal; Notable for the following components:      Result  Value   Sodium 131 (*)    Potassium 2.9 (*)    Chloride 83 (*)    Glucose, Bld 572 (*)    BUN 26 (*)    Creatinine, Ser 2.51 (*)    GFR, Estimated 36 (*)    Anion gap 24 (*)    All other components within normal limits  CBC - Abnormal; Notable for the following components:   HCT 37.7 (*)    MCV 71.1 (*)    MCH 25.8 (*)    MCHC 36.3 (*)    Platelets 428 (*)    All other components within normal limits  URINALYSIS, ROUTINE W REFLEX MICROSCOPIC - Abnormal; Notable for the following components:   Glucose, UA >=500 (*)    Protein, ur 30 (*)    All other components within normal limits  I-STAT VENOUS BLOOD GAS, ED - Abnormal; Notable for the following components:   pH, Ven 7.773 (*)    pCO2, Ven 25.3 (*)    pO2, Ven 167.0 (*)    Bicarbonate 37.1 (*)    TCO2 38 (*)    Acid-Base Excess 18.0 (*)    Sodium 133 (*)    Potassium 2.8 (*)    Calcium, Ion 0.89 (*)    All other components within normal limits  TROPONIN I (HIGH SENSITIVITY) - Abnormal; Notable for the following components:   Troponin I (High Sensitivity) 28 (*)    All other components within normal limits      Otherwise labs showing:    Recent Labs  Lab 12/02/20 2015 12/02/20 2255  NA 131* 133*  K 2.9* 2.8*  CO2 24  --   GLUCOSE 572*  --   BUN 26*  --   CREATININE 2.51*  --   CALCIUM 9.6  --     Cr  * stable,  Up from baseline see below Lab Results  Component Value Date   CREATININE 2.51 (H) 12/02/2020   CREATININE 1.00 10/13/2020   CREATININE 0.85 10/13/2020    Recent Labs  Lab 12/02/20 2015  AST 31  ALT 26  ALKPHOS 97  BILITOT 1.0  PROT 8.0  ALBUMIN 4.5   Lab Results  Component Value Date   CALCIUM 9.6 12/02/2020          WBC      Component Value Date/Time   WBC 9.9 12/02/2020 2015   LYMPHSABS 0.6 (L) 10/13/2020 1036    MONOABS 0.5 10/13/2020 1036   EOSABS 0.0 10/13/2020 1036   BASOSABS 0.0 10/13/2020 1036        Plt: Lab Results  Component Value Date   PLT 428 (H) 12/02/2020     Lactic Acid, Venous    Component Value Date/Time   LATICACIDVEN 2.3 (HH) 10/13/2020 1247   @ (RESUTFAST[lacticacid:4)@  Procalcitonin *** Ordered   COVID-19 Labs  No results for input(s): DDIMER, FERRITIN, LDH, CRP in the last 72 hours.  Lab Results  Component Value Date   SARSCOV2NAA NEGATIVE 12/02/2020   SARSCOV2NAA NEGATIVE 10/13/2020   SARSCOV2NAA Not Detected 07/07/2019   SARSCOV2NAA Not Detected 06/09/2019      Arterial ***Venous  Blood Gas result:  pH *** pCO2 ***; pO2 ***;     %O2 Sat ***.  ABG    Component Value Date/Time   HCO3 37.1 (H) 12/02/2020 2255   TCO2 38 (H) 12/02/2020 2255   O2SAT 100.0 12/02/2020 2255      HG/HCT * stable,  Down *Up from baseline see below  Component Value Date/Time   HGB 14.6 12/02/2020 2255   HCT 43.0 12/02/2020 2255   MCV 71.1 (L) 12/02/2020 2015    Recent Labs  Lab 12/02/20 2015  LIPASE 19   No results for input(s): AMMONIA in the last 168 hours.     Troponin ***ordered Cardiac Panel (last 3 results) No results for input(s): CKTOTAL, CKMB, TROPONINI, RELINDX in the last 72 hours.     ECG: Ordered Personally reviewed by me showing: HR : *** Rhythm: *NSR, Sinus tachycardia * A.fib. W RVR, RBBB, LBBB, Paced Ischemic changes*nonspecific changes, no evidence of ischemic changes QTC*   BNP (last 3 results) Recent Labs    10/13/20 1100  BNP 93.7      DM  labs:  HbA1C: No results for input(s): HGBA1C in the last 8760 hours.     CBG (last 3)  No results for input(s): GLUCAP in the last 72 hours.     UA *** no evidence of UTI  ***Pending ***not ordered   Urine analysis:    Component Value Date/Time   COLORURINE YELLOW 12/02/2020 2028   APPEARANCEUR CLEAR 12/02/2020 2028   LABSPEC 1.017 12/02/2020 2028   PHURINE 7.0  12/02/2020 2028   GLUCOSEU >=500 (A) 12/02/2020 2028   HGBUR NEGATIVE 12/02/2020 2028   BILIRUBINUR NEGATIVE 12/02/2020 2028   KETONESUR NEGATIVE 12/02/2020 2028   PROTEINUR 30 (A) 12/02/2020 2028   NITRITE NEGATIVE 12/02/2020 2028   LEUKOCYTESUR NEGATIVE 12/02/2020 2028       Ordered  CT HEAD *** NON acute  CXR - ***NON acute  CTabd/pelvis - ***nonacute  CTA chest - ***nonacute, no PE, * no evidence of infiltrate      ED Triage Vitals  Enc Vitals Group     BP 12/02/20 1945 100/64     Pulse Rate 12/02/20 1945 (!) 145     Resp 12/02/20 1945 18     Temp 12/02/20 1945 100.2 F (37.9 C)     Temp Source 12/02/20 1945 Oral     SpO2 12/02/20 1945 100 %     Weight --      Height --      Head Circumference --      Peak Flow --      Pain Score 12/02/20 1939 0     Pain Loc --      Pain Edu? --      Excl. in GC? --   TMAX(24)@       Latest  Blood pressure (!) 147/93, pulse 99, temperature 100.2 F (37.9 C), temperature source Oral, resp. rate 17, SpO2 100 %.       Review of Systems:    Pertinent positives include: ***  Constitutional:  No weight loss, night sweats, Fevers, chills, fatigue, weight loss  HEENT:  No headaches, Difficulty swallowing,Tooth/dental problems,Sore throat,  No sneezing, itching, ear ache, nasal congestion, post nasal drip,  Cardio-vascular:  No chest pain, Orthopnea, PND, anasarca, dizziness, palpitations.no Bilateral lower extremity swelling  GI:  No heartburn, indigestion, abdominal pain, nausea, vomiting, diarrhea, change in bowel habits, loss of appetite, melena, blood in stool, hematemesis Resp:  no shortness of breath at rest. No dyspnea on exertion, No excess mucus, no productive cough, No non-productive cough, No coughing up of blood.No change in color of mucus.No wheezing. Skin:  no rash or lesions. No jaundice GU:  no dysuria, change in color of urine, no urgency or frequency. No straining to urinate.  No flank pain.   Musculoskeletal:  No joint  pain or no joint swelling. No decreased range of motion. No back pain.  Psych:  No change in mood or affect. No depression or anxiety. No memory loss.  Neuro: no localizing neurological complaints, no tingling, no weakness, no double vision, no gait abnormality, no slurred speech, no confusion  All systems reviewed and apart from HOPI all are negative  Past Medical History:   Past Medical History:  Diagnosis Date  . Congenital heart disease   . Diabetes mellitus without complication (HCC)   . Seizures (HCC)       Past Surgical History:  Procedure Laterality Date  . open heart surgery    . TEE WITHOUT CARDIOVERSION N/A 01/06/2018   Procedure: TRANSESOPHAGEAL ECHOCARDIOGRAM (TEE);  Surgeon: Wendall StadeNishan, Peter C, MD;  [facility];  Service: Cardiovascular;  Laterality: N/A;    Social History:  Ambulatory *** independently cane, walker  wheelchair bound, bed bound     reports that he has never smoked. He has never used smokeless tobacco. He reports current alcohol use. He reports current drug use. Drug: Marijuana.     Family History: *** Family History  Problem Relation Age of Onset  . Diabetes Mellitus I Sister     Allergies: Allergies  Allergen Reactions  . Ibuprofen     Told not to take ibuprofen after cardiac surgery  . Nsaids Other (See Comments)    Unknown reaction     Prior to Admission medications   Medication Sig Start Date End Date Taking? Authorizing Provider  acetaminophen (TYLENOL) 500 MG tablet Take 2 tablets (1,000 mg total) by mouth every 8 (eight) hours as needed for fever. Patient not taking: Reported on 06/16/2020 01/14/19   McDonald, Pedro EarlsMia A, PA-C  acetaminophen (TYLENOL) 500 MG tablet Take 500 mg by mouth every 6 (six) hours as needed for mild pain.    [provider]  amLODipine (NORVASC) 5 MG tablet Take 5 mg by mouth daily.  10/01/20 10/01/21  [provider]  ASPIRIN LOW DOSE 81 MG EC tablet  Take 81 mg by mouth daily. 09/02/20   [provider]  doxazosin (CARDURA) 2 MG tablet Take 1 mg by mouth at bedtime.  10/01/20 10/01/21  [provider]  insulin degludec (TRESIBA FLEXTOUCH) 100 UNIT/ML FlexTouch Pen Inject 14 Units into the skin at bedtime.  08/04/20   [provider]  insulin glulisine (APIDRA SOLOSTAR) 100 UNIT/ML Solostar Pen Inject 0-45 Units into the skin See admin instructions. total daily dose up to 45 units  with meals and snacks 08/04/20   [provider]  Insulin Syringe-Needle U-100 (INSULIN SYRINGE .3CC/31GX5/16") 31G X 5/16" 0.3 ML MISC 1 Act by Does not apply route 4 (four) times daily -  before meals and at bedtime. 01/09/18   Rolly SalterPatel, Pranav M, MD  lidocaine (XYLOCAINE) 2 % solution Use as directed 15 mLs in the mouth or throat as needed for mouth pain. 06/16/20   Nira Connardama, Pedro Eduardo, MD  oseltamivir (TAMIFLU) 75 MG capsule Take 1 capsule (75 mg total) by mouth every 12 (twelve) hours. Patient not taking: No sig reported 01/14/19   McDonald, Mia A, PA-C  pantoprazole (PROTONIX) 40 MG tablet 40 mg daily.  09/01/20 09/01/21  [provider]  pravastatin (PRAVACHOL) 40 MG tablet Take 40 mg by mouth daily. 09/02/20   [provider]  predniSONE (DELTASONE) 5 MG tablet Take 5 mg by mouth daily with breakfast.  08/04/20   [provider]  promethazine-dextromethorphan (PROMETHAZINE-DM) 6.25-15 MG/5ML syrup Take 5  mLs by mouth 4 (four) times daily as needed for cough. Patient not taking: Reported on 06/16/2020 01/14/19   McDonald, Pedro Earls A, PA-C  sertraline (ZOLOFT) 25 MG tablet Take 25 mg by mouth daily. 09/06/20   [provider]  sildenafil (VIAGRA) 25 MG tablet Take by mouth. 09/14/20 10/14/20  [provider]  VIMPAT 50 MG TABS tablet Take 50 mg by mouth 2 (two) times daily. 08/04/20   [provider]   Physical Exam: Vitals with BMI 12/02/2020 12/02/2020 12/02/2020  Height - - -  Weight - - -   BMI - - -  Systolic 147 156 161  Diastolic 93 101 64  Pulse 99 140 145     1. General:  in No ***Acute distress***increased work of breathing ***complaining of severe pain****agitated * Chronically ill *well *cachectic *toxic acutely ill -appearing 2. Psychological: Alert and *** Oriented 3. Head/ENT:   Moist *** Dry Mucous Membranes                          Head Non traumatic, neck supple                          Normal *** Poor Dentition 4. SKIN: normal *** decreased Skin turgor,  Skin clean Dry and intact no rash 5. Heart: Regular rate and rhythm no*** Murmur, no Rub or gallop 6. Lungs: ***Clear to auscultation bilaterally, no wheezes or crackles   7. Abdomen: Soft, ***non-tender, Non distended *** obese ***bowel sounds present 8. Lower extremities: no clubbing, cyanosis, no ***edema 9. Neurologically Grossly intact, moving all 4 extremities equally *** strength 5 out of 5 in all 4 extremities cranial nerves II through XII intact 10. MSK: Normal range of motion   All other LABS:     Recent Labs  Lab 12/02/20 2015 12/02/20 2255  WBC 9.9  --   HGB 13.7 14.6  HCT 37.7* 43.0  MCV 71.1*  --   PLT 428*  --      Recent Labs  Lab 12/02/20 2015 12/02/20 2255  NA 131* 133*  K 2.9* 2.8*  CL 83*  --   CO2 24  --   GLUCOSE 572*  --   BUN 26*  --   CREATININE 2.51*  --   CALCIUM 9.6  --      Recent Labs  Lab 12/02/20 2015  AST 31  ALT 26  ALKPHOS 97  BILITOT 1.0  PROT 8.0  ALBUMIN 4.5       Cultures:    Component Value Date/Time   SDES IN/OUT CATH URINE 10/13/2020 1134   SPECREQUEST NONE 10/13/2020 1134   CULT  10/13/2020 1134    NO GROWTH Performed at [facility], [address], [place], [place] [address]    REPTSTATUS 10/14/2020 FINAL 10/13/2020 1134     Radiological Exams on Admission: DG Chest Portable 1 View  Result Date: 12/02/2020 CLINICAL DATA:  Vomiting.  History of heart transplant. EXAM: PORTABLE CHEST 1 VIEW COMPARISON:  October 13, 2020 FINDINGS: The heart size and mediastinal contours are within normal limits. Both lungs are clear. The visualized skeletal structures are unremarkable. The patient is status post prior median sternotomy. IMPRESSION: No active disease. Electronically Signed   By: Katherine Mantle M.D.   On: 12/02/2020 22:03   CT CHEST ABDOMEN PELVIS WO CONTRAST  Result Date: 12/02/2020 CLINICAL DATA:  Cardiac transplant in March, recently ran  out of gastroparesis medication, chest pain following emesis, concern for esophageal injury EXAM: CT CHEST, ABDOMEN AND PELVIS WITHOUT CONTRAST TECHNIQUE: Multidetector CT imaging of the chest, abdomen and pelvis was performed following the standard protocol without IV contrast. COMPARISON:  Radiograph 12/02/2020, MR thoracic spine 08/28/2018, CT thoracic spine 08/27/2018 FINDINGS: CT CHEST FINDINGS Cardiovascular: Cardiovascular evaluation limited in the absence of contrast media. There are postsurgical changes from prior sternotomy and orthotopic cardiac transplantation. No gross complications are evident within the limitations of this unenhanced CT. Normal cardiac size. No pericardial effusion. Postsurgical changes along the normal caliber aorta. Incidental note made of the left vertebral artery arising directly from the aortic arch between the origins of the left common carotid and subclavian arteries. Aorta is otherwise unremarkable without periaortic stranding fluid. No major venous abnormalities are seen. Central pulmonary arteries are normal caliber. Luminal evaluation precluded in the absence of contrast media. Mediastinum/Nodes: There is marked circumferential thickening of the distal 2/3 of the thoracic esophagus. Minimal fluid within the esophageal lumen. No extraluminal gas or free fluid is evident however. No acute abnormality of the trachea. Thyroid gland and thoracic inlet are unremarkable. Postsurgical changes anterior mediastinum related to prior sternotomy. No  worrisome mediastinal or axillary adenopathy. Hilar nodal evaluation is limited in the absence of intravenous contrast media. Lungs/Pleura: No consolidation, features of edema, pneumothorax, or effusion. No suspicious pulmonary nodules or masses. Musculoskeletal: Postsurgical changes from prior sternotomy with reinforced sternal sutures which appear intact and aligned as well as remote appearing deformities of several anterior ribs. Anterior wedging compression deformity T12 with up to 20% height loss anteriorly is new from prior. More remote marked vertebral body height loss at T4 with unchanged from prior. Additional milder biconvex deformities T2, T3, T7 and superior endplate deformities at T5 and T6, not significantly changed from comparison imaging. CT ABDOMEN PELVIS FINDINGS Hepatobiliary: No visible focal liver lesion with limitations of this unenhanced CT. Smooth liver surface contour. Normal liver attenuation. Normal gallbladder and biliary tree. Pancreas: No pancreatic ductal dilatation or surrounding inflammatory changes. Spleen: Normal in size. No concerning splenic lesions. Adrenals/Urinary Tract: Normal adrenal glands. Kidneys are normally located with symmetric enhancement and excretion. No suspicious renal lesion, urolithiasis or hydronephrosis. Urinary bladder is unremarkable for the degree of distention. Stomach/Bowel: Distal esophageal thickening, as above. Fluid-filled appearance of the stomach. Duodenum is unremarkable. No small bowel thickening or dilatation. Air-filled appendix in the right lower quadrant. No colonic dilatation or wall thickening. Vascular/Lymphatic: Limited assessment given absence of contrast media. No significant vascular findings are present. No enlarged abdominal or pelvic lymph nodes. Reproductive: The prostate and seminal vesicles are unremarkable. Other: No abdominopelvic free fluid or free gas. No bowel containing hernias. Musculoskeletal: Mild levocurvature of the  lumbar spine, apex L3. No acute osseous abnormality or suspicious osseous lesion. IMPRESSION: 1. Marked circumferential thickening of the distal 2/3 of the thoracic esophagus, suggestive of esophagitis. No extraluminal gas or free fluid to suggest frank perforation. Consider further evaluation with endoscopy as clinically indicated. 2. Postsurgical changes from prior sternotomy and orthotopic cardiac transplantation. No gross complications are evident within the limitations of this unenhanced CT. 3. Anterior wedging compression deformity T12 with up to 20% height loss anteriorly, new from prior. Additional remote compression deformities T2-T7, not significantly changed from comparison imaging. Electronically Signed   By: Kreg Shropshire M.D.   On: 12/02/2020 22:35    Chart has been reviewed    Assessment/Plan  ***  Admitted for ***  Present on Admission: **None**  Other plan as per orders.  DVT prophylaxis:  SCD *** Lovenox       Code Status:    Code Status: Prior FULL CODE *** DNR/DNI ***comfort care as per patient ***family  I had personally discussed CODE STATUS with patient and family* I had spent *min discussing goals of care and CODE STATUS    Family Communication:   Family not at  Bedside  plan of care was discussed on the phone with *** Son, Daughter, Wife, Husband, Sister, Brother , father, mother  Disposition Plan:   *** likely will need placement for rehabilitation                          Back to current facility when stable                            To home once workup is complete and patient is stable  ***Following barriers for discharge:                            Electrolytes corrected                               Anemia corrected                             Pain controlled with PO medications                               Afebrile, white count improving able to transition to PO antibiotics                             Will need to be able to tolerate PO                             Will likely need home health, home O2, set up                           Will need consultants to evaluate patient prior to discharge  ****EXPECT DC tomorrow                    ***Would benefit from PT/OT eval prior to DC  Ordered                   Swallow eval - SLP ordered                   Diabetes care coordinator                   Transition of care consulted                   Nutrition    consulted                  Wound care  consulted                   Palliative care    consulted  Behavioral health  consulted                    Consults called: ***    Admission status:  ED Disposition    None       Obs***  ***  inpatient     I Expect 2 midnight stay secondary to severity of patient's current illness need for inpatient interventions justified by the following: ***hemodynamic instability despite optimal treatment (tachycardia *hypotension * tachypnea *hypoxia, hypercapnia) * Severe lab/radiological/exam abnormalities including:     and extensive comorbidities including: *substance abuse  *Chronic pain *DM2  * CHF * CAD  * COPD/asthma *Morbid Obesity * CKD *dementia *liver disease *history of stroke with residual deficits *  malignancy, * sickle cell disease  . History of amputation . Chronic anticoagulation  That are currently affecting medical management.   I expect  patient to be hospitalized for 2 midnights requiring inpatient medical care.  Patient is at high risk for adverse outcome (such as loss of life or disability) if not treated.  Indication for inpatient stay as follows:  Severe change from baseline regarding mental status Hemodynamic instability despite maximal medical therapy,  ongoing suicidal ideations,  severe pain requiring acute inpatient management,  inability to maintain oral hydration   persistent chest pain despite medical management Need for operative/procedural  intervention New or  worsening hypoxia  Need for IV antibiotics, IV fluids, IV rate controling medications, IV antihypertensives, IV pain medications, IV anticoagulation    Level of care   *** tele  For 12H 24H     medical floor       SDU tele indefinitely please discontinue once patient no longer qualifies COVID-19 Labs    Lab Results  Component Value Date   SARSCOV2NAA NEGATIVE 12/02/2020     Precautions: admitted as *** Covid Negative  ***asymptomatic screening protocol****PUI *** covid positive Airborne and Contact precautions ***If Covid PCR is negative  - please DC precautions - would need additional investigation given very high risk for false native test result   PPE: Used by the provider:   N95***P100  eye Goggles,  Gloves ***gown     Critical***  Patient is critically ill due to  hemodynamic instability * respiratory failure *severe sepsis* ongoing chest pain*  They are at high risk for life/limb threatening clinical deterioration requiring frequent reassessment and modifications of care.  Services provided include examination of the patient, review of relevant ancillary tests, prescription of lifesaving therapies, review of medications and prophylactic therapy.  Total critical care time excluding separately billable procedures: 60*  Minutes.    Manuel Baker 12/02/2020, 11:15 PM ***  [facility]     after 2 AM please page floor coverage PA If 7AM-7PM, please contact the day team taking care of the patient using [email or web address]   Patient was evaluated in the context of the global COVID-19 pandemic, which necessitated consideration that the patient might be at risk for infection with the SARS-CoV-2 virus that causes COVID-19. Institutional protocols and algorithms that pertain to the evaluation of patients at risk for COVID-19 are in a state of rapid change based on information released by regulatory bodies including the CDC and federal and state organizations. These policies and  algorithms were followed during the patient's care.

## 2020-12-02 NOTE — ED Provider Notes (Incomplete)
[facility] EMERGENCY DEPARTMENT Provider Note   CSN: [id number] Arrival date & time: 12/02/20  1923     History Chief Complaint  Patient presents with  . Hematemesis    Manuel Baker is a 25 y.o. male.  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  Patient states that he has been having problems with frequent abdominal pain, nausea/vomiting since July 2021.  Episodes occur approximately twice a month.  However, this morning around 2 AM he had onset of vomiting with subsequent onset of severe chest and abdominal pain.  Pain is located in the central chest from the clavicles down to the epigastric abdomen.  Nonradiating, currently a 7-8/10 in intensity.  He has run out of his home Reglan and so has not been taking anything for his symptoms.  He has been drinking liquids throughout the day, but has not been able to tolerate solid food.  He also complains of shortness of breath.  He notes he had a recent possible Covid exposure as someone tested positive in his workplace, but this was reported anonymously and he is not sure how closely he may have been exposed.  Due to his vomiting, he has missed his antirejection medications last night and this morning.  The last time he took his mycophenolate or tacrolimus was yesterday morning.    Past Medical History:  Diagnosis Date  . Congenital heart disease   . Diabetes mellitus without complication (HCC)   . Seizures [facility])     Patient Active Problem List   Diagnosis Date Noted  . Septic embolism (HCC)   . Libman Sacks endocarditis (HCC)   . Leucocytosis   . [place] endocarditis   . 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 Uncontrolled type 1  diabetes mellitus with hyperglycemia (HCC) 01/03/2018  . Seizure (HCC) 01/03/2018  . Stroke (cerebrum) (HCC) 01/03/2018  . Stroke (HCC) 01/03/2018  . Ebstein's anomaly     Past Surgical History:  Procedure Laterality Date  . open heart surgery    . TEE WITHOUT CARDIOVERSION N/A 01/06/2018   Procedure: TRANSESOPHAGEAL ECHOCARDIOGRAM (TEE);  Surgeon: Wendall Stade, MD;  Location: [facility] ENDOSCOPY;  Service: Cardiovascular;  Laterality: N/A;       Family History  Problem Relation Age of Onset  . Diabetes Mellitus I Sister     Social History   Tobacco Use  . Smoking status: Never Smoker  . Smokeless tobacco: Never Used  Vaping Use  . Vaping Use: Never used  Substance Use Topics  . Alcohol use: Yes    Comment: social  . Drug use: Yes    Types: Marijuana    Home Medications Prior to Admission medications   Medication Sig Start Date End Date Taking? Authorizing Provider  acetaminophen (TYLENOL) 500 MG tablet Take 2 tablets (1,000 mg total) by mouth every 8 (eight) hours as needed for fever. Patient not taking: Reported on 06/16/2020 01/14/19   McDonald, Pedro Earls A, PA-C  acetaminophen (TYLENOL) 500 MG tablet Take 500 mg by mouth every 6 (six) hours as needed for mild pain.    [provider]  amLODipine (NORVASC) 5 MG tablet Take 5 mg by mouth daily.  10/01/20 10/01/21  [provider]  ASPIRIN LOW DOSE 81 MG EC tablet Take  81 mg by mouth daily. 09/02/20   [provider]  doxazosin (CARDURA) 2 MG tablet Take 1 mg by mouth at bedtime.  10/01/20 10/01/21  [provider]  insulin degludec (TRESIBA FLEXTOUCH) 100 UNIT/ML FlexTouch Pen Inject 14 Units into the skin at bedtime.  08/04/20   [provider]  insulin glulisine (APIDRA SOLOSTAR) 100 UNIT/ML Solostar Pen Inject 0-45 Units into the skin See admin instructions. total daily dose up to 45 units  with meals and snacks 08/04/20   [provider]  Insulin Syringe-Needle U-100 (INSULIN  SYRINGE .3CC/31GX5/16") 31G X 5/16" 0.3 ML MISC 1 Act by Does not apply route 4 (four) times daily -  before meals and at bedtime. 01/09/18   Rolly Salter, MD  lidocaine (XYLOCAINE) 2 % solution Use as directed 15 mLs in the mouth or throat as needed for mouth pain. 06/16/20   Nira Conn, MD  oseltamivir (TAMIFLU) 75 MG capsule Take 1 capsule (75 mg total) by mouth every 12 (twelve) hours. Patient not taking: No sig reported 01/14/19   McDonald, Mia A, PA-C  pantoprazole (PROTONIX) 40 MG tablet 40 mg daily.  09/01/20 09/01/21  [provider]  pravastatin (PRAVACHOL) 40 MG tablet Take 40 mg by mouth daily. 09/02/20   [provider]  predniSONE (DELTASONE) 5 MG tablet Take 5 mg by mouth daily with breakfast.  08/04/20   [provider]  promethazine-dextromethorphan (PROMETHAZINE-DM) 6.25-15 MG/5ML syrup Take 5 mLs by mouth 4 (four) times daily as needed for cough. Patient not taking: Reported on 06/16/2020 01/14/19   McDonald, Pedro Earls A, PA-C  sertraline (ZOLOFT) 25 MG tablet Take 25 mg by mouth daily. 09/06/20   [provider]  sildenafil (VIAGRA) 25 MG tablet Take by mouth. 09/14/20 10/14/20  [provider]  VIMPAT 50 MG TABS tablet Take 50 mg by mouth 2 (two) times daily. 08/04/20   [provider]    Allergies    Ibuprofen and Nsaids  Review of Systems   Review of Systems  Constitutional: Negative for chills and fever.  HENT: Positive for sore throat. Negative for ear pain.   Eyes: Negative for pain and visual disturbance.  Respiratory: Positive for cough and shortness of breath. Negative for wheezing.   Cardiovascular: Negative for chest pain and palpitations.  Gastrointestinal: Positive for abdominal pain, nausea and vomiting.  Genitourinary: Negative for dysuria and hematuria.  Musculoskeletal: Negative for gait problem and joint swelling.  Skin: Negative for color change and rash.  Neurological: Negative for syncope and  headaches.  Psychiatric/Behavioral: Negative for confusion and suicidal ideas.  All other systems reviewed and are negative.   Physical Exam Updated Vital Signs BP (!) 147/93   Pulse 99   Temp 100.2 F (37.9 C) (Oral)   Resp 17   SpO2 100%   Physical Exam  ED Results / Procedures / Treatments   Labs (all labs ordered are listed, but only abnormal results are displayed) Labs Reviewed  COMPREHENSIVE METABOLIC PANEL - Abnormal; Notable for the following components:      Result Value   Sodium 131 (*)    Potassium 2.9 (*)    Chloride 83 (*)    Glucose, Bld 572 (*)    BUN 26 (*)    Creatinine, Ser 2.51 (*)    GFR, Estimated 36 (*)    Anion gap 24 (*)    All other components within normal limits  CBC - Abnormal; Notable for the following components:   HCT  37.7 (*)    MCV 71.1 (*)    MCH 25.8 (*)    MCHC 36.3 (*)    Platelets 428 (*)    All other components within normal limits  URINALYSIS, ROUTINE W REFLEX MICROSCOPIC - Abnormal; Notable for the following components:   Glucose, UA >=500 (*)    Protein, ur 30 (*)    All other components within normal limits  RESP PANEL BY RT-PCR (FLU A&B, COVID) ARPGX2  CULTURE, BLOOD (SINGLE)  URINE CULTURE  LIPASE, BLOOD  LACTIC ACID, PLASMA  LACTIC ACID, PLASMA  PROTIME-INR  APTT  BETA-HYDROXYBUTYRIC ACID  I-STAT VENOUS BLOOD GAS, ED  TROPONIN I (HIGH SENSITIVITY)    EKG EKG Interpretation  Date/Time:  Saturday December 02 2020 19:39:31 EST Ventricular Rate:  142 PR Interval:  128 QRS Duration: 82 QT Interval:  350 QTC Calculation: 538 R Axis:   99 Text Interpretation: *** Suspect arm lead reversal, interpretation assumes no reversal Sinus tachycardia Rightward axis Nonspecific ST abnormality Abnormal ECG No significant change since last tracing Confirmed by Melene Plan [phone]) on 12/02/2020 8:48:24 PM   Radiology No results found.  Procedures Procedures (including critical care time)  Medications Ordered in  ED Medications  oxyCODONE-acetaminophen (PERCOCET/ROXICET) 5-325 MG per tablet 1 tablet (1 tablet Oral Given 12/02/20 2002)  LORazepam (ATIVAN) injection 1 mg (has no administration in time range)    ED Course  I have reviewed the triage vital signs and the nursing notes.  Pertinent labs & imaging results that were available during my care of the patient were reviewed by me and considered in my medical decision making (see chart for details).    MDM Rules/Calculators/A&P                          *** Final Clinical Impression(s) / ED Diagnoses Final diagnoses:  Trauma    Rx / DC Orders ED Discharge Orders    None

## 2020-12-03 ENCOUNTER — Inpatient Hospital Stay (HOSPITAL_COMMUNITY): Payer: Medicaid Other

## 2020-12-03 DIAGNOSIS — E10649 Type 1 diabetes mellitus with hypoglycemia without coma: Secondary | ICD-10-CM | POA: Diagnosis present

## 2020-12-03 DIAGNOSIS — I361 Nonrheumatic tricuspid (valve) insufficiency: Secondary | ICD-10-CM | POA: Diagnosis not present

## 2020-12-03 DIAGNOSIS — Q225 Ebstein's anomaly: Secondary | ICD-10-CM | POA: Diagnosis not present

## 2020-12-03 DIAGNOSIS — K209 Esophagitis, unspecified without bleeding: Secondary | ICD-10-CM | POA: Diagnosis present

## 2020-12-03 DIAGNOSIS — Z941 Heart transplant status: Secondary | ICD-10-CM | POA: Diagnosis not present

## 2020-12-03 DIAGNOSIS — Z8774 Personal history of (corrected) congenital malformations of heart and circulatory system: Secondary | ICD-10-CM | POA: Diagnosis not present

## 2020-12-03 DIAGNOSIS — I10 Essential (primary) hypertension: Secondary | ICD-10-CM | POA: Diagnosis present

## 2020-12-03 DIAGNOSIS — E1065 Type 1 diabetes mellitus with hyperglycemia: Secondary | ICD-10-CM | POA: Diagnosis not present

## 2020-12-03 DIAGNOSIS — R739 Hyperglycemia, unspecified: Secondary | ICD-10-CM | POA: Diagnosis present

## 2020-12-03 DIAGNOSIS — N179 Acute kidney failure, unspecified: Secondary | ICD-10-CM | POA: Diagnosis present

## 2020-12-03 DIAGNOSIS — K920 Hematemesis: Secondary | ICD-10-CM | POA: Diagnosis present

## 2020-12-03 DIAGNOSIS — Z681 Body mass index (BMI) 19 or less, adult: Secondary | ICD-10-CM | POA: Diagnosis not present

## 2020-12-03 DIAGNOSIS — E860 Dehydration: Secondary | ICD-10-CM | POA: Diagnosis present

## 2020-12-03 DIAGNOSIS — D84821 Immunodeficiency due to drugs: Secondary | ICD-10-CM | POA: Diagnosis present

## 2020-12-03 DIAGNOSIS — E1043 Type 1 diabetes mellitus with diabetic autonomic (poly)neuropathy: Secondary | ICD-10-CM | POA: Diagnosis not present

## 2020-12-03 DIAGNOSIS — R6511 Systemic inflammatory response syndrome (SIRS) of non-infectious origin with acute organ dysfunction: Secondary | ICD-10-CM | POA: Diagnosis present

## 2020-12-03 DIAGNOSIS — E1143 Type 2 diabetes mellitus with diabetic autonomic (poly)neuropathy: Secondary | ICD-10-CM | POA: Diagnosis not present

## 2020-12-03 DIAGNOSIS — G9341 Metabolic encephalopathy: Secondary | ICD-10-CM | POA: Diagnosis present

## 2020-12-03 DIAGNOSIS — D509 Iron deficiency anemia, unspecified: Secondary | ICD-10-CM | POA: Diagnosis present

## 2020-12-03 DIAGNOSIS — E876 Hypokalemia: Secondary | ICD-10-CM | POA: Diagnosis present

## 2020-12-03 DIAGNOSIS — R9431 Abnormal electrocardiogram [ECG] [EKG]: Secondary | ICD-10-CM | POA: Diagnosis not present

## 2020-12-03 DIAGNOSIS — F1290 Cannabis use, unspecified, uncomplicated: Secondary | ICD-10-CM | POA: Diagnosis present

## 2020-12-03 DIAGNOSIS — I351 Nonrheumatic aortic (valve) insufficiency: Secondary | ICD-10-CM | POA: Diagnosis not present

## 2020-12-03 DIAGNOSIS — K3184 Gastroparesis: Secondary | ICD-10-CM | POA: Diagnosis not present

## 2020-12-03 DIAGNOSIS — R1110 Vomiting, unspecified: Secondary | ICD-10-CM | POA: Insufficient documentation

## 2020-12-03 DIAGNOSIS — K2100 Gastro-esophageal reflux disease with esophagitis, without bleeding: Secondary | ICD-10-CM | POA: Diagnosis present

## 2020-12-03 DIAGNOSIS — Z79899 Other long term (current) drug therapy: Secondary | ICD-10-CM | POA: Diagnosis not present

## 2020-12-03 DIAGNOSIS — Z20822 Contact with and (suspected) exposure to covid-19: Secondary | ICD-10-CM | POA: Diagnosis present

## 2020-12-03 DIAGNOSIS — R7989 Other specified abnormal findings of blood chemistry: Secondary | ICD-10-CM | POA: Diagnosis present

## 2020-12-03 DIAGNOSIS — Z8673 Personal history of transient ischemic attack (TIA), and cerebral infarction without residual deficits: Secondary | ICD-10-CM

## 2020-12-03 DIAGNOSIS — E43 Unspecified severe protein-calorie malnutrition: Secondary | ICD-10-CM | POA: Diagnosis present

## 2020-12-03 DIAGNOSIS — R112 Nausea with vomiting, unspecified: Secondary | ICD-10-CM | POA: Diagnosis not present

## 2020-12-03 DIAGNOSIS — Z9119 Patient's noncompliance with other medical treatment and regimen: Secondary | ICD-10-CM | POA: Diagnosis not present

## 2020-12-03 LAB — COMPREHENSIVE METABOLIC PANEL
ALT: 23 U/L (ref 0–44)
AST: 29 U/L (ref 15–41)
Albumin: 4.2 g/dL (ref 3.5–5.0)
Alkaline Phosphatase: 94 U/L (ref 38–126)
Anion gap: 20 — ABNORMAL HIGH (ref 5–15)
BUN: 24 mg/dL — ABNORMAL HIGH (ref 6–20)
CO2: 30 mmol/L (ref 22–32)
Calcium: 9.3 mg/dL (ref 8.9–10.3)
Chloride: 86 mmol/L — ABNORMAL LOW (ref 98–111)
Creatinine, Ser: 2.2 mg/dL — ABNORMAL HIGH (ref 0.61–1.24)
GFR, Estimated: 42 mL/min — ABNORMAL LOW (ref >60)
Glucose, Bld: 288 mg/dL — ABNORMAL HIGH (ref 70–99)
Potassium: 3.7 mmol/L (ref 3.5–5.1)
Sodium: 136 mmol/L (ref 135–145)
Total Bilirubin: 0.5 mg/dL (ref 0.3–1.2)
Total Protein: 7.8 g/dL (ref 6.5–8.1)

## 2020-12-03 LAB — RAPID URINE DRUG SCREEN, HOSP PERFORMED
Amphetamines: NOT DETECTED
Barbiturates: NOT DETECTED
Benzodiazepines: NOT DETECTED
Cocaine: NOT DETECTED
Opiates: NOT DETECTED
Tetrahydrocannabinol: POSITIVE — AB

## 2020-12-03 LAB — CBC
HCT: 36.7 % — ABNORMAL LOW (ref 39.0–52.0)
Hemoglobin: 12.1 g/dL — ABNORMAL LOW (ref 13.0–17.0)
MCH: 25.1 pg — ABNORMAL LOW (ref 26.0–34.0)
MCHC: 33 g/dL (ref 30.0–36.0)
MCV: 76.1 fL — ABNORMAL LOW (ref 80.0–100.0)
Platelets: 341 10*3/uL (ref 150–400)
RBC: 4.82 MIL/uL (ref 4.22–5.81)
RDW: 14.4 % (ref 11.5–15.5)
WBC: 9.2 10*3/uL (ref 4.0–10.5)
nRBC: 0 % (ref 0.0–0.2)

## 2020-12-03 LAB — RESPIRATORY PANEL BY PCR

## 2020-12-03 LAB — CBG MONITORING, ED
Glucose-Capillary: 416 mg/dL — ABNORMAL HIGH (ref 70–99)
Glucose-Capillary: 480 mg/dL — ABNORMAL HIGH (ref 70–99)
Glucose-Capillary: 474 mg/dL — ABNORMAL HIGH (ref 70–99)
Glucose-Capillary: 307 mg/dL — ABNORMAL HIGH (ref 70–99)
Glucose-Capillary: 178 mg/dL — ABNORMAL HIGH (ref 70–99)
Glucose-Capillary: 221 mg/dL — ABNORMAL HIGH (ref 70–99)
Glucose-Capillary: 200 mg/dL — ABNORMAL HIGH (ref 70–99)
Glucose-Capillary: 189 mg/dL — ABNORMAL HIGH (ref 70–99)
Glucose-Capillary: 203 mg/dL — ABNORMAL HIGH (ref 70–99)
Glucose-Capillary: 202 mg/dL — ABNORMAL HIGH (ref 70–99)
Glucose-Capillary: 162 mg/dL — ABNORMAL HIGH (ref 70–99)
Glucose-Capillary: 150 mg/dL — ABNORMAL HIGH (ref 70–99)
Glucose-Capillary: 321 mg/dL — ABNORMAL HIGH (ref 70–99)
Glucose-Capillary: 328 mg/dL — ABNORMAL HIGH (ref 70–99)
Glucose-Capillary: 235 mg/dL — ABNORMAL HIGH (ref 70–99)
Glucose-Capillary: 175 mg/dL — ABNORMAL HIGH (ref 70–99)
Glucose-Capillary: 195 mg/dL — ABNORMAL HIGH (ref 70–99)
Glucose-Capillary: 170 mg/dL — ABNORMAL HIGH (ref 70–99)
Glucose-Capillary: 285 mg/dL — ABNORMAL HIGH (ref 70–99)

## 2020-12-03 LAB — CREATININE, URINE, RANDOM: Creatinine, Urine: 91.31 mg/dL

## 2020-12-03 LAB — BASIC METABOLIC PANEL
Anion gap: 18 — ABNORMAL HIGH (ref 5–15)
Anion gap: 13 (ref 5–15)
BUN: 23 mg/dL — ABNORMAL HIGH (ref 6–20)
BUN: 22 mg/dL — ABNORMAL HIGH (ref 6–20)
CO2: 28 mmol/L (ref 22–32)
CO2: 29 mmol/L (ref 22–32)
Calcium: 8.3 mg/dL — ABNORMAL LOW (ref 8.9–10.3)
Calcium: 8.8 mg/dL — ABNORMAL LOW (ref 8.9–10.3)
Chloride: 88 mmol/L — ABNORMAL LOW (ref 98–111)
Chloride: 93 mmol/L — ABNORMAL LOW (ref 98–111)
Creatinine, Ser: 1.97 mg/dL — ABNORMAL HIGH (ref 0.61–1.24)
Creatinine, Ser: 1.77 mg/dL — ABNORMAL HIGH (ref 0.61–1.24)
GFR, Estimated: 48 mL/min — ABNORMAL LOW (ref >60)
GFR, Estimated: 55 mL/min — ABNORMAL LOW (ref >60)
Glucose, Bld: 442 mg/dL — ABNORMAL HIGH (ref 70–99)
Glucose, Bld: 213 mg/dL — ABNORMAL HIGH (ref 70–99)
Potassium: 4.1 mmol/L (ref 3.5–5.1)
Potassium: 4.3 mmol/L (ref 3.5–5.1)
Sodium: 134 mmol/L — ABNORMAL LOW (ref 135–145)
Sodium: 135 mmol/L (ref 135–145)

## 2020-12-03 LAB — CBC WITH DIFFERENTIAL/PLATELET
Abs Immature Granulocytes: 0.04 10*3/uL (ref 0.00–0.07)
Basophils Absolute: 0 10*3/uL (ref 0.0–0.1)
Basophils Relative: 0 %
Eosinophils Absolute: 0 10*3/uL (ref 0.0–0.5)
Eosinophils Relative: 0 %
HCT: 39.9 % (ref 39.0–52.0)
Hemoglobin: 13.3 g/dL (ref 13.0–17.0)
Immature Granulocytes: 0 %
Lymphocytes Relative: 9 %
Lymphs Abs: 0.9 10*3/uL (ref 0.7–4.0)
MCH: 24.9 pg — ABNORMAL LOW (ref 26.0–34.0)
MCHC: 33.3 g/dL (ref 30.0–36.0)
MCV: 74.6 fL — ABNORMAL LOW (ref 80.0–100.0)
Monocytes Absolute: 1.3 10*3/uL — ABNORMAL HIGH (ref 0.1–1.0)
Monocytes Relative: 14 %
Neutro Abs: 7.4 10*3/uL (ref 1.7–7.7)
Neutrophils Relative %: 77 %
Platelets: 364 10*3/uL (ref 150–400)
RBC: 5.35 MIL/uL (ref 4.22–5.81)
RDW: 14.6 % (ref 11.5–15.5)
WBC: 9.7 10*3/uL (ref 4.0–10.5)
nRBC: 0 % (ref 0.0–0.2)

## 2020-12-03 LAB — MAGNESIUM: Magnesium: 1.7 mg/dL (ref 1.7–2.4)

## 2020-12-03 LAB — TROPONIN I (HIGH SENSITIVITY)
Troponin I (High Sensitivity): 36 ng/L — ABNORMAL HIGH (ref <18)
Troponin I (High Sensitivity): 30 ng/L — ABNORMAL HIGH (ref <18)

## 2020-12-03 LAB — HEMOGLOBIN A1C
Hgb A1c MFr Bld: 9.7 % — ABNORMAL HIGH (ref 4.8–5.6)
Mean Plasma Glucose: 231.69 mg/dL

## 2020-12-03 LAB — CK: Total CK: 115 U/L (ref 49–397)

## 2020-12-03 LAB — LACTIC ACID, PLASMA
Lactic Acid, Venous: 3.9 mmol/L (ref 0.5–1.9)
Lactic Acid, Venous: 1.9 mmol/L (ref 0.5–1.9)
Lactic Acid, Venous: 4.3 mmol/L (ref 0.5–1.9)
Lactic Acid, Venous: 1.3 mmol/L (ref 0.5–1.9)

## 2020-12-03 LAB — HIV ANTIBODY (ROUTINE TESTING W REFLEX): HIV Screen 4th Generation wRfx: NONREACTIVE

## 2020-12-03 LAB — GLUCOSE, CAPILLARY: Glucose-Capillary: 297 mg/dL — ABNORMAL HIGH (ref 70–99)

## 2020-12-03 LAB — PHOSPHORUS: Phosphorus: 5.2 mg/dL — ABNORMAL HIGH (ref 2.5–4.6)

## 2020-12-03 LAB — BETA-HYDROXYBUTYRIC ACID: Beta-Hydroxybutyric Acid: 0.07 mmol/L (ref 0.05–0.27)

## 2020-12-03 LAB — PROCALCITONIN: Procalcitonin: 0.95 ng/mL

## 2020-12-03 LAB — TSH: TSH: 0.933 u[IU]/mL (ref 0.350–4.500)

## 2020-12-03 LAB — SODIUM, URINE, RANDOM: Sodium, Ur: 41 mmol/L

## 2020-12-03 MED ORDER — MYCOPHENOLATE SODIUM 180 MG PO TBEC
720.0000 mg | DELAYED_RELEASE_TABLET | Freq: Two times a day (BID) | ORAL | Status: DC
  Administered 2020-12-03 – 2020-12-07 (×9): 720 mg via ORAL
  Filled 2020-12-03 (×11): qty 4

## 2020-12-03 MED ORDER — PANTOPRAZOLE SODIUM 40 MG IV SOLR
40.0000 mg | Freq: Once | INTRAVENOUS | Status: AC
  Administered 2020-12-03: 40 mg via INTRAVENOUS
  Filled 2020-12-03: qty 40

## 2020-12-03 MED ORDER — INSULIN REGULAR(HUMAN) IN NACL 100-0.9 UT/100ML-% IV SOLN
INTRAVENOUS | Status: DC
  Administered 2020-12-03: 5.5 [IU]/h via INTRAVENOUS
  Filled 2020-12-03: qty 100

## 2020-12-03 MED ORDER — DEXTROSE 50 % IV SOLN
0.0000 mL | INTRAVENOUS | Status: DC | PRN
Start: 2020-12-03 — End: 2020-12-04

## 2020-12-03 MED ORDER — LACOSAMIDE 50 MG PO TABS
50.0000 mg | ORAL_TABLET | Freq: Two times a day (BID) | ORAL | Status: DC
  Administered 2020-12-03 – 2020-12-07 (×10): 50 mg via ORAL
  Filled 2020-12-03 (×10): qty 1

## 2020-12-03 MED ORDER — METHYLPREDNISOLONE SODIUM SUCC 40 MG IJ SOLR
6.0000 mg | Freq: Every day | INTRAMUSCULAR | Status: DC
  Administered 2020-12-03 – 2020-12-07 (×5): 6 mg via INTRAVENOUS
  Filled 2020-12-03 (×6): qty 1

## 2020-12-03 MED ORDER — METOCLOPRAMIDE HCL 5 MG/ML IJ SOLN
10.0000 mg | Freq: Three times a day (TID) | INTRAMUSCULAR | Status: DC | PRN
  Administered 2020-12-04 – 2020-12-05 (×3): 10 mg via INTRAVENOUS
  Filled 2020-12-03 (×3): qty 2

## 2020-12-03 MED ORDER — ACETAMINOPHEN 325 MG PO TABS: 650.0000 mg | ORAL_TABLET | Freq: Four times a day (QID) | ORAL | Status: DC | PRN

## 2020-12-03 MED ORDER — DEXTROSE IN LACTATED RINGERS 5 % IV SOLN: INTRAVENOUS | Status: DC

## 2020-12-03 MED ORDER — MYCOPHENOLATE SODIUM 180 MG PO TBEC: 720.0000 mg | DELAYED_RELEASE_TABLET | Freq: Two times a day (BID) | ORAL | Status: DC

## 2020-12-03 MED ORDER — PANTOPRAZOLE SODIUM 40 MG IV SOLR
40.0000 mg | Freq: Two times a day (BID) | INTRAVENOUS | Status: DC
  Administered 2020-12-06 – 2020-12-07 (×3): 40 mg via INTRAVENOUS
  Filled 2020-12-03 (×4): qty 40

## 2020-12-03 MED ORDER — SODIUM CHLORIDE 0.9 % IV SOLN
8.0000 mg/h | INTRAVENOUS | Status: DC
  Administered 2020-12-03: 8 mg/h via INTRAVENOUS
  Filled 2020-12-03 (×2): qty 80

## 2020-12-03 MED ORDER — ACETAMINOPHEN 650 MG RE SUPP: 650.0000 mg | Freq: Four times a day (QID) | RECTAL | Status: DC | PRN

## 2020-12-03 MED ORDER — VANCOMYCIN HCL 750 MG/150ML IV SOLN
750.0000 mg | INTRAVENOUS | Status: DC
  Administered 2020-12-04: 750 mg via INTRAVENOUS
  Filled 2020-12-03: qty 150

## 2020-12-03 MED ORDER — TACROLIMUS ER 1 MG PO TB24
6.0000 mg | ORAL_TABLET | Freq: Every day | ORAL | Status: DC
  Administered 2020-12-03 – 2020-12-07 (×5): 6 mg via ORAL
  Filled 2020-12-03 (×6): qty 6

## 2020-12-03 MED ORDER — PRAVASTATIN SODIUM 40 MG PO TABS
40.0000 mg | ORAL_TABLET | Freq: Every day | ORAL | Status: DC
  Administered 2020-12-03 – 2020-12-07 (×5): 40 mg via ORAL
  Filled 2020-12-03 (×6): qty 1

## 2020-12-03 MED ORDER — VANCOMYCIN HCL IN DEXTROSE 1-5 GM/200ML-% IV SOLN
1000.0000 mg | Freq: Once | INTRAVENOUS | Status: AC
  Administered 2020-12-03: 1000 mg via INTRAVENOUS
  Filled 2020-12-03: qty 200

## 2020-12-03 MED ORDER — SODIUM CHLORIDE 0.9 % IV SOLN
2.0000 g | INTRAVENOUS | Status: DC
  Administered 2020-12-04: 2 g via INTRAVENOUS
  Filled 2020-12-03: qty 2

## 2020-12-03 MED ORDER — METOCLOPRAMIDE HCL 5 MG/ML IJ SOLN
5.0000 mg | Freq: Three times a day (TID) | INTRAMUSCULAR | Status: DC
  Administered 2020-12-03 – 2020-12-04 (×4): 5 mg via INTRAVENOUS
  Filled 2020-12-03 (×4): qty 2

## 2020-12-03 MED ORDER — LACTATED RINGERS IV SOLN: INTRAVENOUS | Status: DC

## 2020-12-03 MED ORDER — HYDROCODONE-ACETAMINOPHEN 5-325 MG PO TABS
1.0000 | ORAL_TABLET | ORAL | Status: DC | PRN
  Administered 2020-12-03 – 2020-12-04 (×3): 1 via ORAL
  Filled 2020-12-03 (×3): qty 1

## 2020-12-03 MED ORDER — SODIUM CHLORIDE 0.9 % IV SOLN
2.0000 g | Freq: Once | INTRAVENOUS | Status: AC
  Administered 2020-12-03: 2 g via INTRAVENOUS
  Filled 2020-12-03: qty 2

## 2020-12-03 NOTE — Progress Notes (Signed)
Pharmacy Antibiotic Note  Manuel Baker is a 25 y.o. male admitted on 12/02/2020 with sepsis.  Pharmacy has been consulted for vancomycin and cefepime dosing. SrCr 2.51, baseline ~1  Plan: Cefepime 2gm IV q24 hours Vancomycin 1gm IV x 1 then 750 mg IV q24 F/u renal function, cultures and clinical course  Height: 5\' 7"  (170.2 cm) Weight: 52.2 kg (115 lb) IBW/kg (Calculated) : 66.1  Temp (24hrs), Avg:100.2 F (37.9 C), Min:100.2 F (37.9 C), Max:100.2 F (37.9 C)  Recent Labs  Lab 12/02/20 2015 12/02/20 2212  WBC 9.9  --   CREATININE 2.51*  --   LATICACIDVEN  --  3.9*    Estimated Creatinine Clearance: 33.8 mL/min (A) (by C-G formula based on SCr of 2.51 mg/dL (H)).    Allergies  Allergen Reactions  . Ibuprofen     Told not to take ibuprofen after cardiac surgery  . Nsaids Other (See Comments)    Unknown reaction    Thank you for allowing pharmacy to be a part of this patient's care.  [address] 12/03/2020 1:26 AM

## 2020-12-03 NOTE — Progress Notes (Signed)
Inpatient Diabetes Program Recommendations  AACE/ADA: New Consensus Statement on Inpatient Glycemic Control (2015)  Target Ranges:  Prepandial:   less than 140 mg/dL      Peak postprandial:   less than 180 mg/dL (1-2 hours)      Critically ill patients:  140 - 180 mg/dL   Lab Results  Component Value Date   GLUCAP 150 (H) 12/03/2020   HGBA1C 9.7 (H) 12/03/2020    Review of Glycemic Control  Diabetes history: DM1 Outpatient Diabetes medications: Tresiba 16 units q hs + Apidra tid to cover meals and snacks up to 0-45 Total Daily dose. Current orders for Inpatient glycemic control: IV insulin  Inpatient Diabetes Program Recommendations:   When patient ready to transition from IV insulin: Lantus 12 units (80% daily home basal dose = 12.8 units) Add Novolog meal coverage when starting to eat Novolog 0-9 units q 4 hrs. While NPO then tid + hs 0-5 units  Thank you, Billy Fischer. Bertie Simien, RN, MSN, CDE  Diabetes Coordinator Inpatient Glycemic Control Team Team Pager [phone] (8am-5pm) 12/03/2020 12:54 PM

## 2020-12-03 NOTE — Progress Notes (Signed)
PROGRESS NOTE  Manuel Baker  DOB: November 27, 1995  PCP: Demaris Callander Gol[facility] [employer] [place] FVC:[id number]  DOA: 12/02/2020  LOS: 0 days   Chief Complaint  Patient presents with  . Hematemesis   * Brief narrative: Manuel Baker is a 25 y.o. 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 Patient presented to the ED on 1/8 with complaint of nausea, vomiting, hematemesis. He apparently ran out of Reglan the day before he started having nausea and vomiting.  Over an hour duration, he had 5-6 forceful episodes of vomiting followed by a small amount of bright red blood.  Since being in the ED, patient has not had any vomiting.    In the ED, he was tachycardic to 145, hypertensive with systolics in 178, CBG 584, hemoglobin 14.6--> 13.3 overnight. He was noted to be hypoglycemic, creatinine elevated to 2.51. CT abdomen pelvis did not show anything acute. Labs initially showed potassium low at 2.9 which was replaced. Patient was admitted to hospital service.  GI consultation was obtained.  Subjective: Patient was seen and examined this morning. Opens eyes on verbal command, falls right back to sleep.  Has not had any vomiting overnight.  Currently on insulin drip.  Assessment/Plan: Intractable nausea and vomiting Hematemesis History of gastroparesis, GERD, esophagitis -Apparently patient ran out of his Reglan and he started having nausea and vomiting.  After 5-6 forceful episodes in an hour, he had flecks of blood. -GI consultation obtained.  Patient probably had some mucosal tear related to forceful vomiting. -Currently on Protonix 40 mg IV twice daily. -Has not had vomiting overnight.  IV Reglan as needed.  Acute metabolic encephalopathy -Continues to remain  somnolent this morning.  Unclear cause. -Based on his significant cardiac history, history of stroke, we may need to obtain a CT scan of his head.  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 fib arrest and acute CVA. -Also had ASD repair done at the age of 55. -History of Cheryle Horsfall Sacks endocarditis  -He is chronically immunosuppressed with tacrolimus and prednisone.  I am not sure if patient was also on CellCept???? -BActrim DS for prophylaxis last episode of acute rejection 02/18/2020 - 03/16/2020  -Monitor tacrolimus level.  Obtain echocardiogram. -followed at [facility], ER provider has discussed case with [facility]  Type 1 diabetes mellitus Hyperglycemia -A1c 9.7 on 1/9 -Started on insulin drip in the ED.  Currently meds of the same.  Remains very sleepy.  Will try to avoid sedatives/pain medicines, let him wake up and eat.  For now, I would maintain insulin drip at a low rate.  -Home meds include Tresiba 15 units at bedtime and sliding scale insulin with Accu-Cheks. -Need to ultimately switch to subcutaneous insulin. Recent Labs  Lab 12/03/20 0710 12/03/20 0819 12/03/20 0922 12/03/20 1021 12/03/20 1119  GLUCAP 200* 189* 203* 202* 162*   SIRS -Patient had a temperature of 100.2 in the ED.  No episode of fever after that.  He was tachycardic and had elevated creatinine and lactic acid level likely due to dehydration.  I do not see a source of infection. -Sepsis ruled out. Recent Labs  Lab 12/02/20 2015 12/02/20 2212 12/03/20 0130 12/03/20 0300 12/03/20 0448 12/03/20 1005  WBC 9.9  --   --   --  9.7 9.2  LATICACIDVEN  --  3.9* 1.9 4.3*  --   --   PROCALCITON  --   --  0.95  --   --   --    Acute kidney injury -Due to dehydration.  Creatinine normal at baseline, presented with 2.51.  Improving.  Continue IV fluids. Recent Labs    06/15/20 2307 06/25/20 1650 10/13/20 1036 10/13/20 1716  10/13/20 1911 12/02/20 2015 12/03/20 0130 12/03/20 0448 12/03/20 1005  BUN 9 13 15 9 11  26* 23* 24* 22*  CREATININE 1.59* 1.02 1.61* 0.85 1.00 2.51* 1.97* 2.20* 1.77*   Elevated lactic acid level  -likely due to dehydration -Expect improvement with IV fluid. Recent Labs  Lab 12/02/20 2212 12/03/20 0130 12/03/20 0300  LATICACIDVEN 3.9* 1.9 4.3*   Essential hypertension -Home meds include Amlodipine 5 mg daily, Cardura 1 mg at bedtime -Currently on hold.  Blood pressure remains normal range.  History of CVA -with/out residual deficits on Aspirin 81 mg, statin  hx of seizure - continue Vimpat   Mobility: Encourage ambulation Code Status:   Code Status: Full Code  Nutritional status: Body mass index is 18.01 kg/m.     Diet Order            Diet clear liquid Room service appropriate? Yes; Fluid consistency: Thin  Diet effective now                 DVT prophylaxis: SCDs Start: 12/03/20 01/31/21   Antimicrobials:  None Fluid: LR at 125 mill per hour Consultants: GI, GU cardiac transplant team on the phone Family Communication:  Not at bedside  Status is: Inpatient  Remains inpatient appropriate because: Remains somnolent, pending full work-up   Dispo:  Patient From: Home  Planned Disposition: Home  Expected discharge date: 12/05/2020  Medically stable for discharge: No        Infusions:  . sodium chloride    . [START ON 12/04/2020] ceFEPime (MAXIPIME) IV    . dextrose 5% lactated ringers 125 mL/hr at 12/03/20 0514  . insulin 1.1 Units/hr (12/03/20 1122)  . lactated ringers 125 mL/hr at 12/03/20 0227  . pantoprozole (PROTONIX) infusion 8 mg/hr (12/03/20 0305)  . [START ON 12/04/2020] vancomycin      Scheduled Meds: . lacosamide  50 mg Oral BID  . methylPREDNISolone (SOLU-MEDROL) injection  6 mg Intravenous Daily  . metoCLOPramide (REGLAN) injection  5 mg Intravenous Q8H  . mycophenolate  720 mg Oral BID  . [START ON 12/06/2020] pantoprazole  40 mg  Intravenous Q12H  . pravastatin  40 mg Oral Daily  . Tacrolimus ER  6 mg Oral QAC breakfast    Antimicrobials: Anti-infectives (From admission, onward)   Start     Dose/Rate Route Frequency Ordered Stop   12/04/20 0200  vancomycin (VANCOREADY) IVPB 750 mg/150 mL        750 mg 150 mL/hr over 60 Minutes Intravenous Every 24 hours 12/03/20 0125     12/04/20 0130  ceFEPIme (MAXIPIME) 2 g in sodium chloride 0.9 % 100 mL IVPB        2 g 200 mL/hr over 30 Minutes Intravenous Every 24 hours 12/03/20 0125     12/03/20 0130  ceFEPIme (MAXIPIME) 2 g in sodium chloride 0.9 % 100 mL IVPB        2 g 200 mL/hr over 30 Minutes Intravenous  Once 12/03/20 0122 12/03/20 0300   12/03/20 0130  vancomycin (VANCOCIN) IVPB 1000 mg/200 mL premix  1,000 mg 200 mL/hr over 60 Minutes Intravenous  Once 12/03/20 0122 12/03/20 0330      PRN meds: sodium chloride, acetaminophen **OR** acetaminophen, dextrose, HYDROcodone-acetaminophen   Objective: Vitals:   12/03/20 0930 12/03/20 1100  BP: 119/76 127/90  Pulse: (!) 102 95  Resp: 19 20  Temp:    SpO2: 100% 100%   No intake or output data in the 24 hours ending 12/03/20 1145 Filed Weights   12/03/20 0113  Weight: 52.2 kg   Weight change:  Body mass index is 18.01 kg/m.   Physical Exam: General exam: Not in physical distress.  Remains somnolent. Skin: No rashes, lesions or ulcers. HEENT: Atraumatic, normocephalic, no obvious bleeding Lungs: Clear to auscultation bilaterally CVS: Midsternal scar.  Tachycardic at baseline post cardiac transplant status GI/Abd soft, nontender, nondistended, bowel sound present CNS: Somnolent, opens eyes on verbal command.  Falls right back to sleep.  Psychiatry: Unable to examine because of altered mental status Extremities: No pedal edema, no calf tenderness  Data Review: I have personally reviewed the laboratory data and studies available.  Recent Labs  Lab 12/02/20 2015 12/02/20 2255 12/03/20 0448  12/03/20 1005  WBC 9.9  --  9.7 9.2  NEUTROABS  --   --  7.4  --   HGB 13.7 14.6 13.3 12.1*  HCT 37.7* 43.0 39.9 36.7*  MCV 71.1*  --  74.6* 76.1*  PLT 428*  --  364 341   Recent Labs  Lab 12/02/20 2015 12/02/20 2255 12/03/20 0130 12/03/20 0448 12/03/20 1005  NA 131* 133* 134* 136 135  K 2.9* 2.8* 4.1 3.7 4.3  CL 83*  --  88* 86* 93*  CO2 24  --  28 30 29   GLUCOSE 572*  --  442* 288* 213*  BUN 26*  --  23* 24* 22*  CREATININE 2.51*  --  1.97* 2.20* 1.77*  CALCIUM 9.6  --  8.3* 9.3 8.8*  MG  --   --  1.7  --   --   PHOS  --   --  5.2*  --   --     F/u labs ordered  Signed, , MD [facility] 12/03/2020

## 2020-12-03 NOTE — ED Notes (Signed)
2nd attempt to call report. RN states she is doing an admission now and to call charge. Charge did not answer. Will try again later.

## 2020-12-03 NOTE — ED Notes (Signed)
Attempted report... RN states charge nurse has not looked over chart yet. Will call back

## 2020-12-03 NOTE — Consult Note (Signed)
Consultation  Referring Provider: Dr. Pola Corn    Primary Care Physician:  [place], [place] [facility] Primary Gastroenterologist: Tildon Husky here       Reason for Consultation: Hematemesis, nausea and vomiting, history of gastroparesis            HPI:   Manuel Baker is a 25 y.o. 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     Today, the patient is hard to get a history from as he falls asleep between questions.  Describes that he ran out of his gastroparesis medication yesterday and started with nausea and vomiting.  He had at least 5 or 6 forceful episodes over about an hour or 2 time span and then started to see a small amount of bright red blood as well as occasional darker flecks, since presenting to the ER he has had no further episodes since around 8 PM yesterday.  Tells me that he is typically on Reglan, not sure of the dosage about 3-4 times a day.  Tells me that currently he feels okay.    Does describe smoking marijuana about 2 G per day for the past 3 to 4 years.    Denies fever, chills, abdominal pain or symptoms that awaken him from sleep.  ER course: Heart rate 145, hypertensive with systolics in 178, CBG 584, hemoglobin 14.6--> 13.3 overnight  GI history: 08/28/2020 EGD: Small hiatal hernia and grade 2 reflux esophagitis-placed on PPI twice daily x6 weeks  Gastric emptying study with delayed emptying- per discharge report   Past Medical History:  Diagnosis Date  . Congenital heart disease   . Diabetes mellitus without complication (HCC)   . Seizures (HCC)     Past Surgical  History:  Procedure Laterality Date  . open heart surgery    . TEE WITHOUT CARDIOVERSION N/A 01/06/2018   Procedure: TRANSESOPHAGEAL ECHOCARDIOGRAM (TEE);  Surgeon: Wendall Stade, MD;  Location: [facility] ENDOSCOPY;  Service: Cardiovascular;  Laterality: N/A;    Family History  Problem Relation Age of Onset  . Diabetes Mellitus I Sister     Social History   Tobacco Use  . Smoking status: Never Smoker  . Smokeless tobacco: Never Used  Vaping Use  . Vaping Use: Never used  Substance Use Topics  . Alcohol use: Yes    Comment: social  . Drug use: Yes    Types: Marijuana    Prior to Admission medications   Medication Sig Start Date End Date Taking? Authorizing Provider  acetaminophen (TYLENOL) 500 MG tablet Take 500 mg by mouth every 6 (six) hours as needed for mild pain.   Yes [provider]  amLODipine (NORVASC) 5 MG tablet Take 5 mg by mouth daily.  10/01/20 10/01/21 Yes [provider]  ASPIRIN LOW DOSE 81 MG EC tablet Take 81 mg by mouth daily. 09/02/20  Yes [provider]  doxazosin (CARDURA) 2 MG tablet Take 1 mg by mouth at bedtime. 10/31/20 10/31/21 Yes [provider]  Glucagon (BAQSIMI ONE PACK) 3 MG/DOSE POWD Place 1 spray into the nose as needed (low blood sugar).   Yes [provider]  insulin degludec (TRESIBA  FLEXTOUCH) 100 UNIT/ML FlexTouch Pen Inject 14 Units into the skin at bedtime.  08/04/20  Yes [provider]  insulin glulisine (APIDRA SOLOSTAR) 100 UNIT/ML Solostar Pen Inject 0-45 Units into the skin See admin instructions. total daily dose up to 45 units  with meals and snacks 08/04/20  Yes [provider]  Insulin Syringe-Needle U-100 (INSULIN SYRINGE .3CC/31GX5/16") 31G X 5/16" 0.3 ML MISC 1 Act by Does not apply route 4 (four) times daily -  before meals and at bedtime. 01/09/18  Yes Rolly Salter, MD  metoCLOPramide (REGLAN) 5 MG tablet Take 1 tablet by mouth every 8 (eight) hours. 10/18/20  Yes  [provider]  pantoprazole (PROTONIX) 40 MG tablet 40 mg daily.  09/01/20 09/01/21 Yes [provider]  polyethylene glycol powder (GLYCOLAX/MIRALAX) 17 GM/SCOOP powder Take 17 g by mouth daily as needed for constipation. 10/18/20  Yes [provider]  pravastatin (PRAVACHOL) 40 MG tablet Take 40 mg by mouth daily. 09/02/20  Yes [provider]  predniSONE (DELTASONE) 5 MG tablet Take 5 mg by mouth daily with breakfast.  08/04/20  Yes [provider]  sertraline (ZOLOFT) 50 MG tablet Take 50 mg by mouth daily.   Yes [provider]  sildenafil (VIAGRA) 25 MG tablet Take by mouth. 09/14/20 12/03/20 Yes [provider]  sulfamethoxazole-trimethoprim (BACTRIM) 400-80 MG tablet Take 1 tablet by mouth daily.   Yes [provider]  Tacrolimus ER 1 MG TB24 Take 6 mg by mouth daily before breakfast. 10/31/20 10/31/21 Yes [provider]  VIMPAT 50 MG TABS tablet Take 50 mg by mouth 2 (two) times daily. 08/04/20  Yes [provider]    Current Facility-Administered Medications  Medication Dose Route Frequency Provider Last Rate Last Admin  . 0.9 %  sodium chloride infusion   Intravenous PRN Therisa Doyne, MD      . acetaminophen (TYLENOL) tablet 650 mg  650 mg Oral Q6H PRN Doutova, Anastassia, MD       Or  . acetaminophen (TYLENOL) suppository 650 mg  650 mg Rectal Q6H PRN Therisa Doyne, MD      . Melene Muller ON 12/04/2020] ceFEPIme (MAXIPIME) 2 g in sodium chloride 0.9 % 100 mL IVPB  2 g Intravenous Q24H Doutova, Anastassia, MD      . dextrose 5 % in lactated ringers infusion   Intravenous Continuous Therisa Doyne, MD 125 mL/hr at 12/03/20 0514 New Bag at 12/03/20 0514  . dextrose 50 % solution 0-50 mL  0-50 mL Intravenous PRN Doutova, Anastassia, MD      . HYDROcodone-acetaminophen (NORCO/VICODIN) 5-325 MG per tablet 1-2 tablet  1-2 tablet Oral Q4H PRN Doutova, Anastassia, MD      . insulin regular, human  (MYXREDLIN) 100 units/ 100 mL infusion   Intravenous Continuous Doutova, Anastassia, MD 1.6 mL/hr at 12/03/20 0712 1.6 Units/hr at 12/03/20 0712  . lacosamide (VIMPAT) tablet 50 mg  50 mg Oral BID Therisa Doyne, MD   50 mg at 12/03/20 0517  . lactated ringers infusion   Intravenous Continuous Therisa Doyne, MD 125 mL/hr at 12/03/20 0227 New Bag at 12/03/20 0227  . methylPREDNISolone sodium succinate (SOLU-MEDROL) 40 mg/mL injection 6 mg  6 mg Intravenous Daily Doutova, Anastassia, MD   6 mg at 12/03/20 0459  . metoCLOPramide (REGLAN) injection 5 mg  5 mg Intravenous Q8H Doutova, Anastassia, MD   5 mg at 12/03/20 0605  . mycophenolate (MYFORTIC) EC tablet 720 mg  720 mg Oral BID Therisa Doyne, MD  720 mg at 12/03/20 0833  . pantoprazole (PROTONIX) 80 mg in sodium chloride 0.9 % 100 mL (0.8 mg/mL) infusion  8 mg/hr Intravenous Continuous Doutova, Anastassia, MD 10 mL/hr at 12/03/20 0305 8 mg/hr at 12/03/20 0305  . [START ON 12/06/2020] pantoprazole (PROTONIX) injection 40 mg  40 mg Intravenous Q12H Doutova, Anastassia, MD      . pravastatin (PRAVACHOL) tablet 40 mg  40 mg Oral Daily Doutova, Anastassia, MD   40 mg at 12/03/20 0517  . Tacrolimus ER TB24 6 mg  6 mg Oral QAC breakfast Therisa Doyneoutova, Anastassia, MD   6 mg at 12/03/20 0849  . [START ON 12/04/2020] vancomycin (VANCOREADY) IVPB 750 mg/150 mL  750 mg Intravenous Q24H Therisa Doyneoutova, Anastassia, MD       Current Outpatient Medications  Medication Sig Dispense Refill  . acetaminophen (TYLENOL) 500 MG tablet Take 500 mg by mouth every 6 (six) hours as needed for mild pain.    Marland Kitchen. amLODipine (NORVASC) 5 MG tablet Take 5 mg by mouth daily.     . ASPIRIN LOW DOSE 81 MG EC tablet Take 81 mg by mouth daily.    Marland Kitchen. doxazosin (CARDURA) 2 MG tablet Take 1 mg by mouth at bedtime.    . Glucagon (BAQSIMI ONE PACK) 3 MG/DOSE POWD Place 1 spray into the nose as needed (low blood sugar).    . insulin degludec (TRESIBA FLEXTOUCH) 100 UNIT/ML FlexTouch Pen  Inject 14 Units into the skin at bedtime.     . insulin glulisine (APIDRA SOLOSTAR) 100 UNIT/ML Solostar Pen Inject 0-45 Units into the skin See admin instructions. total daily dose up to 45 units  with meals and snacks    . Insulin Syringe-Needle U-100 (INSULIN SYRINGE .3CC/31GX5/16") 31G X 5/16" 0.3 ML MISC 1 Act by Does not apply route 4 (four) times daily -  before meals and at bedtime. 300 each 0  . metoCLOPramide (REGLAN) 5 MG tablet Take 1 tablet by mouth every 8 (eight) hours.    . pantoprazole (PROTONIX) 40 MG tablet 40 mg daily.     . polyethylene glycol powder (GLYCOLAX/MIRALAX) 17 GM/SCOOP powder Take 17 g by mouth daily as needed for constipation.    . pravastatin (PRAVACHOL) 40 MG tablet Take 40 mg by mouth daily.    . predniSONE (DELTASONE) 5 MG tablet Take 5 mg by mouth daily with breakfast.     . sertraline (ZOLOFT) 50 MG tablet Take 50 mg by mouth daily.    . sildenafil (VIAGRA) 25 MG tablet Take by mouth.    . sulfamethoxazole-trimethoprim (BACTRIM) 400-80 MG tablet Take 1 tablet by mouth daily.    . Tacrolimus ER 1 MG TB24 Take 6 mg by mouth daily before breakfast.    . VIMPAT 50 MG TABS tablet Take 50 mg by mouth 2 (two) times daily.      Allergies as of 12/02/2020 - Review Complete 12/02/2020  Allergen Reaction Noted  . Ibuprofen  06/15/2020  . Nsaids Other (See Comments) 10/13/2020     Review of Systems:    Constitutional: No weight loss, fever or chills Skin: No rash Cardiovascular: No chest pain  Respiratory: No SOB  Gastrointestinal: See HPI and otherwise negative Genitourinary: No dysuria  Neurological: No headache, dizziness or syncope Musculoskeletal: No new muscle or joint pain Hematologic: No bruising Psychiatric: No history of depression or anxiety    Physical Exam:  Vital signs in last 24 hours: Temp:  [98.4 F (36.9 C)-100.2 F (37.9 C)] 98.7 F (37.1 C) (01/09  0747) Pulse Rate:  [89-145] 90 (01/09 0747) Resp:  [16-29] 20 (01/09 0747) BP:  (100-156)/(64-113) 123/87 (01/09 0747) SpO2:  [100 %] 100 % (01/09 0747) Weight:  [52.2 kg] 52.2 kg (01/09 0113)   General:   Lethargic AA male appears to be in NAD, Well developed, Well nourished, alert and cooperative Head:  Normocephalic and atraumatic. Eyes:   PEERL, EOMI. No icterus. Conjunctiva pink. Ears:  Normal auditory acuity. Neck:  Supple Throat: Oral cavity and pharynx without inflammation, swelling or lesion.  Lungs: Respirations even and unlabored. Lungs clear to auscultation bilaterally.   No wheezes, crackles, or rhonchi.  Heart: Normal S1, S2. No MRG. Regular rate and rhythm. No peripheral edema, cyanosis or pallor.  Abdomen:  Soft, nondistended, nontender. No rebound or guarding. Normal bowel sounds. No appreciable masses or hepatomegaly. Rectal:  Not performed.  Msk:  Symmetrical without gross deformities. Peripheral pulses intact.  Extremities:  Without edema, no deformity or joint abnormality. Neurologic:  Alert and  oriented x4;  grossly normal neurologically.  Skin:   Dry and intact without significant lesions or rashes. Psychiatric: Demonstrates good judgement and reason without abnormal affect or behaviors.   LAB RESULTS: Recent Labs    12/02/20 2015 12/02/20 2255 12/03/20 0448  WBC 9.9  --  9.7  HGB 13.7 14.6 13.3  HCT 37.7* 43.0 39.9  PLT 428*  --  364   BMET Recent Labs    12/02/20 2015 12/02/20 2255 12/03/20 0130 12/03/20 0448  NA 131* 133* 134* 136  K 2.9* 2.8* 4.1 3.7  CL 83*  --  88* 86*  CO2 24  --  28 30  GLUCOSE 572*  --  442* 288*  BUN 26*  --  23* 24*  CREATININE 2.51*  --  1.97* 2.20*  CALCIUM 9.6  --  8.3* 9.3   LFT Recent Labs    12/03/20 0448  PROT 7.8  ALBUMIN 4.2  AST 29  ALT 23  ALKPHOS 94  BILITOT 0.5   PT/INR Recent Labs    12/02/20 2134  LABPROT 14.2  INR 1.1    STUDIES: DG Chest Portable 1 View  Result Date: 12/02/2020 CLINICAL DATA:  Vomiting.  History of heart transplant. EXAM: PORTABLE CHEST 1  VIEW COMPARISON:  October 13, 2020 FINDINGS: The heart size and mediastinal contours are within normal limits. Both lungs are clear. The visualized skeletal structures are unremarkable. The patient is status post prior median sternotomy. IMPRESSION: No active disease. Electronically Signed   By: Katherine Mantle M.D.   On: 12/02/2020 22:03   CT CHEST ABDOMEN PELVIS WO CONTRAST  Result Date: 12/02/2020 CLINICAL DATA:  Cardiac transplant in March, recently ran out of gastroparesis medication, chest pain following emesis, concern for esophageal injury EXAM: CT CHEST, ABDOMEN AND PELVIS WITHOUT CONTRAST TECHNIQUE: Multidetector CT imaging of the chest, abdomen and pelvis was performed following the standard protocol without IV contrast. COMPARISON:  Radiograph 12/02/2020, MR thoracic spine 08/28/2018, CT thoracic spine 08/27/2018 FINDINGS: CT CHEST FINDINGS Cardiovascular: Cardiovascular evaluation limited in the absence of contrast media. There are postsurgical changes from prior sternotomy and orthotopic cardiac transplantation. No gross complications are evident within the limitations of this unenhanced CT. Normal cardiac size. No pericardial effusion. Postsurgical changes along the normal caliber aorta. Incidental note made of the left vertebral artery arising directly from the aortic arch between the origins of the left common carotid and subclavian arteries. Aorta is otherwise unremarkable without periaortic stranding fluid. No major venous abnormalities are seen. Central  pulmonary arteries are normal caliber. Luminal evaluation precluded in the absence of contrast media. Mediastinum/Nodes: There is marked circumferential thickening of the distal 2/3 of the thoracic esophagus. Minimal fluid within the esophageal lumen. No extraluminal gas or free fluid is evident however. No acute abnormality of the trachea. Thyroid gland and thoracic inlet are unremarkable. Postsurgical changes anterior mediastinum related  to prior sternotomy. No worrisome mediastinal or axillary adenopathy. Hilar nodal evaluation is limited in the absence of intravenous contrast media. Lungs/Pleura: No consolidation, features of edema, pneumothorax, or effusion. No suspicious pulmonary nodules or masses. Musculoskeletal: Postsurgical changes from prior sternotomy with reinforced sternal sutures which appear intact and aligned as well as remote appearing deformities of several anterior ribs. Anterior wedging compression deformity T12 with up to 20% height loss anteriorly is new from prior. More remote marked vertebral body height loss at T4 with unchanged from prior. Additional milder biconvex deformities T2, T3, T7 and superior endplate deformities at T5 and T6, not significantly changed from comparison imaging. CT ABDOMEN PELVIS FINDINGS Hepatobiliary: No visible focal liver lesion with limitations of this unenhanced CT. Smooth liver surface contour. Normal liver attenuation. Normal gallbladder and biliary tree. Pancreas: No pancreatic ductal dilatation or surrounding inflammatory changes. Spleen: Normal in size. No concerning splenic lesions. Adrenals/Urinary Tract: Normal adrenal glands. Kidneys are normally located with symmetric enhancement and excretion. No suspicious renal lesion, urolithiasis or hydronephrosis. Urinary bladder is unremarkable for the degree of distention. Stomach/Bowel: Distal esophageal thickening, as above. Fluid-filled appearance of the stomach. Duodenum is unremarkable. No small bowel thickening or dilatation. Air-filled appendix in the right lower quadrant. No colonic dilatation or wall thickening. Vascular/Lymphatic: Limited assessment given absence of contrast media. No significant vascular findings are present. No enlarged abdominal or pelvic lymph nodes. Reproductive: The prostate and seminal vesicles are unremarkable. Other: No abdominopelvic free fluid or free gas. No bowel containing hernias. Musculoskeletal: Mild  levocurvature of the lumbar spine, apex L3. No acute osseous abnormality or suspicious osseous lesion. IMPRESSION: 1. Marked circumferential thickening of the distal 2/3 of the thoracic esophagus, suggestive of esophagitis. No extraluminal gas or free fluid to suggest frank perforation. Consider further evaluation with endoscopy as clinically indicated. 2. Postsurgical changes from prior sternotomy and orthotopic cardiac transplantation. No gross complications are evident within the limitations of this unenhanced CT. 3. Anterior wedging compression deformity T12 with up to 20% height loss anteriorly, new from prior. Additional remote compression deformities T2-T7, not significantly changed from comparison imaging. Electronically Signed   By: Kreg Shropshire M.D.   On: 12/02/2020 22:35    Impression / Plan:   Impression: 1.  Nausea/vomiting with hematemesis: History of gastroparesis, stopped taking his Reglan and started with episodes of nausea and vomiting, after multiple episodes he then saw some bright red blood streaks as well as occasional dark flecks of blood, CT as above showing esophagitis, same seen at time of last EGD in October; most likely esophagitis +/-Mallory-Weiss tear 2.  History of cardiac transplant 3.  History of seizures  Plan: 1.  At time of my interview the patient is very tired, he has not had an episode of vomiting since around 8 PM yesterday.  Hemoglobin with only slight drop which is likely partially due to to fluids.  Do not feel patient needs emergent EGD. 2.  Continue to monitor hemoglobin, let us know if there are signs of acute GI bleeding. 3.  Agree with Pantoprazole 40 mg twice daily IV, will likely need to continue this at time of  discharge for at least 6 weeks. 4.  Could consider adding Carafate. 5.  Please await further recommendations from Dr. Rhea BeltonPyrtle later today.  Thank you for your kind consultation, we will continue to follow.  Violet BaldyJennifer Lynne Caesar Mannella  12/03/2020,  9:15 AM

## 2020-12-03 NOTE — ED Notes (Signed)
Pt home meds will be delivered to pharmacy

## 2020-12-03 NOTE — ED Notes (Signed)
Charge states she will have to call me back to get report.

## 2020-12-04 ENCOUNTER — Inpatient Hospital Stay (HOSPITAL_COMMUNITY): Payer: Medicaid Other

## 2020-12-04 DIAGNOSIS — K3184 Gastroparesis: Secondary | ICD-10-CM

## 2020-12-04 DIAGNOSIS — R9431 Abnormal electrocardiogram [ECG] [EKG]: Secondary | ICD-10-CM

## 2020-12-04 DIAGNOSIS — I361 Nonrheumatic tricuspid (valve) insufficiency: Secondary | ICD-10-CM | POA: Diagnosis not present

## 2020-12-04 DIAGNOSIS — I351 Nonrheumatic aortic (valve) insufficiency: Secondary | ICD-10-CM | POA: Diagnosis not present

## 2020-12-04 DIAGNOSIS — E1143 Type 2 diabetes mellitus with diabetic autonomic (poly)neuropathy: Secondary | ICD-10-CM

## 2020-12-04 LAB — ECHOCARDIOGRAM COMPLETE
AR max vel: 2.79 cm2
AV Area VTI: 2.64 cm2
AV Area mean vel: 2.59 cm2
AV Mean grad: 4 mmHg
AV Peak grad: 6.6 mmHg
Ao pk vel: 1.28 m/s
Area-P 1/2: 5.31 cm2
Height: 67 in
S' Lateral: 3.1 cm
Weight: 1671.97 oz

## 2020-12-04 LAB — GLUCOSE, CAPILLARY
Glucose-Capillary: 155 mg/dL — ABNORMAL HIGH (ref 70–99)
Glucose-Capillary: 189 mg/dL — ABNORMAL HIGH (ref 70–99)
Glucose-Capillary: 152 mg/dL — ABNORMAL HIGH (ref 70–99)
Glucose-Capillary: 133 mg/dL — ABNORMAL HIGH (ref 70–99)
Glucose-Capillary: 139 mg/dL — ABNORMAL HIGH (ref 70–99)
Glucose-Capillary: 121 mg/dL — ABNORMAL HIGH (ref 70–99)
Glucose-Capillary: 106 mg/dL — ABNORMAL HIGH (ref 70–99)
Glucose-Capillary: 90 mg/dL (ref 70–99)
Glucose-Capillary: 190 mg/dL — ABNORMAL HIGH (ref 70–99)
Glucose-Capillary: 184 mg/dL — ABNORMAL HIGH (ref 70–99)
Glucose-Capillary: 231 mg/dL — ABNORMAL HIGH (ref 70–99)
Glucose-Capillary: 112 mg/dL — ABNORMAL HIGH (ref 70–99)

## 2020-12-04 LAB — CBC
HCT: 33.8 % — ABNORMAL LOW (ref 39.0–52.0)
Hemoglobin: 11.5 g/dL — ABNORMAL LOW (ref 13.0–17.0)
MCH: 25.7 pg — ABNORMAL LOW (ref 26.0–34.0)
MCHC: 34 g/dL (ref 30.0–36.0)
MCV: 75.6 fL — ABNORMAL LOW (ref 80.0–100.0)
Platelets: 293 10*3/uL (ref 150–400)
RBC: 4.47 MIL/uL (ref 4.22–5.81)
RDW: 14.6 % (ref 11.5–15.5)
WBC: 5.5 10*3/uL (ref 4.0–10.5)
nRBC: 0 % (ref 0.0–0.2)

## 2020-12-04 LAB — BASIC METABOLIC PANEL
Anion gap: 13 (ref 5–15)
BUN: 17 mg/dL (ref 6–20)
CO2: 25 mmol/L (ref 22–32)
Calcium: 8.7 mg/dL — ABNORMAL LOW (ref 8.9–10.3)
Chloride: 97 mmol/L — ABNORMAL LOW (ref 98–111)
Creatinine, Ser: 1.23 mg/dL (ref 0.61–1.24)
GFR, Estimated: >60 mL/min (ref >60)
Glucose, Bld: 131 mg/dL — ABNORMAL HIGH (ref 70–99)
Potassium: 3.4 mmol/L — ABNORMAL LOW (ref 3.5–5.1)
Sodium: 135 mmol/L (ref 135–145)

## 2020-12-04 LAB — URINE CULTURE: Culture: <10000 — AB

## 2020-12-04 MED ORDER — BISACODYL 10 MG RE SUPP
10.0000 mg | Freq: Every day | RECTAL | Status: DC | PRN
  Administered 2020-12-04: 10 mg via RECTAL
  Filled 2020-12-04: qty 1

## 2020-12-04 MED ORDER — SULFAMETHOXAZOLE-TRIMETHOPRIM 400-80 MG PO TABS
1.0000 | ORAL_TABLET | Freq: Every day | ORAL | Status: DC
  Administered 2020-12-04 – 2020-12-07 (×4): 1 via ORAL
  Filled 2020-12-04 (×4): qty 1

## 2020-12-04 MED ORDER — INSULIN ASPART 100 UNIT/ML ~~LOC~~ SOLN
0.0000 [IU] | Freq: Three times a day (TID) | SUBCUTANEOUS | Status: DC
  Administered 2020-12-04: 5 [IU] via SUBCUTANEOUS
  Administered 2020-12-04 (×2): 3 [IU] via SUBCUTANEOUS
  Administered 2020-12-05: 11 [IU] via SUBCUTANEOUS
  Administered 2020-12-05: 5 [IU] via SUBCUTANEOUS
  Administered 2020-12-05: 2 [IU] via SUBCUTANEOUS
  Administered 2020-12-06: 5 [IU] via SUBCUTANEOUS
  Administered 2020-12-06: 2 [IU] via SUBCUTANEOUS
  Administered 2020-12-06: 3 [IU] via SUBCUTANEOUS
  Administered 2020-12-07 (×3): 5 [IU] via SUBCUTANEOUS

## 2020-12-04 MED ORDER — INSULIN ASPART 100 UNIT/ML ~~LOC~~ SOLN: 0.0000 [IU] | Freq: Every day | SUBCUTANEOUS | Status: DC

## 2020-12-04 MED ORDER — AMLODIPINE BESYLATE 5 MG PO TABS
5.0000 mg | ORAL_TABLET | Freq: Every day | ORAL | Status: DC
  Administered 2020-12-04 – 2020-12-07 (×4): 5 mg via ORAL
  Filled 2020-12-04 (×4): qty 1

## 2020-12-04 MED ORDER — ENSURE ENLIVE PO LIQD
237.0000 mL | Freq: Three times a day (TID) | ORAL | Status: DC
  Administered 2020-12-04 – 2020-12-05 (×2): 237 mL via ORAL

## 2020-12-04 MED ORDER — HYDROCODONE-ACETAMINOPHEN 5-325 MG PO TABS
1.0000 | ORAL_TABLET | Freq: Three times a day (TID) | ORAL | Status: DC | PRN
  Administered 2020-12-04 – 2020-12-07 (×5): 1 via ORAL
  Filled 2020-12-04 (×5): qty 1

## 2020-12-04 MED ORDER — INSULIN DETEMIR 100 UNIT/ML ~~LOC~~ SOLN
10.0000 [IU] | Freq: Every day | SUBCUTANEOUS | Status: DC
  Filled 2020-12-04: qty 0.1

## 2020-12-04 MED ORDER — DOXAZOSIN MESYLATE 2 MG PO TABS
1.0000 mg | ORAL_TABLET | Freq: Every day | ORAL | Status: DC
  Administered 2020-12-04 – 2020-12-06 (×3): 1 mg via ORAL
  Filled 2020-12-04 (×3): qty 1

## 2020-12-04 MED ORDER — METOCLOPRAMIDE HCL 5 MG/ML IJ SOLN
5.0000 mg | Freq: Three times a day (TID) | INTRAMUSCULAR | Status: DC
  Administered 2020-12-04 – 2020-12-05 (×3): 5 mg via INTRAVENOUS
  Filled 2020-12-04 (×3): qty 2

## 2020-12-04 MED ORDER — ADULT MULTIVITAMIN W/MINERALS CH
1.0000 | ORAL_TABLET | Freq: Every day | ORAL | Status: DC
  Administered 2020-12-05 – 2020-12-07 (×3): 1 via ORAL
  Filled 2020-12-04 (×3): qty 1

## 2020-12-04 MED ORDER — INSULIN DETEMIR 100 UNIT/ML ~~LOC~~ SOLN
10.0000 [IU] | Freq: Every day | SUBCUTANEOUS | Status: DC
  Administered 2020-12-04 – 2020-12-05 (×2): 10 [IU] via SUBCUTANEOUS
  Filled 2020-12-04 (×2): qty 0.1

## 2020-12-04 NOTE — Progress Notes (Signed)
  Echocardiogram 2D Echocardiogram has been performed.  Manuel Baker 12/04/2020, 8:27 AM

## 2020-12-04 NOTE — Progress Notes (Signed)
Initial Nutrition Assessment  DOCUMENTATION CODES:   Underweight,Severe malnutrition in context of chronic illness  INTERVENTION:   Liberalize diet to REGULAR  Recommend small more frequent meals with gastroparesis   Ensure Enlive po TID, each supplement provides 350 kcal and 20 grams of protein  MVI daily  NUTRITION DIAGNOSIS:   Severe Malnutrition related to chronic illness (gastroparesis, hx cardiac transplant) as evidenced by moderate fat depletion,severe muscle depletion,percent weight loss.  GOAL:   Patient will meet greater than or equal to 90% of their needs  MONITOR:   PO intake,Supplement acceptance,Weight trends,Labs,I & O's  REASON FOR ASSESSMENT:   Consult Assessment of nutrition requirement/status  ASSESSMENT:   Patient with PMH significant for cardiac transplant March 2021 for Ebstein's anomaly, acute rejection resulting in v.fib arrest in March 2021, stroke, type I DM, gastroparesis, depression, neutropenia, and history of COVID. Presents this admission with hematemesis.   Pt endorses ongoing vomiting over the last three days due to running out of reglan. States he was unable to keep liquids down during this time. Intake has remained decreased since transplant. Unable to elaborate on this given PT working with pt during this time. Currently struggling with nausea. Zofran to be given per RN. RD to provide supplementation to maximize kcal and protein this admission. Liberalize diet to regular as pt is type 1 and is severely malnourished.   Pt reports a UBW of 135 lb and a 25 lb wt loss over the last year. Records indicate pt weighed 115 lb on 10/13/20 and 104 lb this admission (10% wt loss in 2 months, significant for time frame).   UOP: 1225 ml x 24 hrs   Drips: LR @ 75 ml/hr  Medications: SS novolog, levemir, solumedrol Labs: K 3.4 (L) CBG 139-297  NUTRITION - FOCUSED PHYSICAL EXAM:  Flowsheet Row Most Recent Value  Orbital Region Moderate depletion   Upper Arm Region Severe depletion  Thoracic and Lumbar Region Unable to assess  Buccal Region Moderate depletion  Temple Region Moderate depletion  Clavicle Bone Region Severe depletion  Clavicle and Acromion Bone Region Severe depletion  Scapular Bone Region Unable to assess  Dorsal Hand Mild depletion  Patellar Region Severe depletion  Anterior Thigh Region Severe depletion  Posterior Calf Region Severe depletion  Edema (RD Assessment) None  Hair Reviewed  Eyes Reviewed  Mouth Reviewed  Skin Reviewed  Nails Reviewed     Diet Order:   Diet Order            Diet Carb Modified Fluid consistency: Thin; Room service appropriate? Yes  Diet effective now                 EDUCATION NEEDS:   Education needs have been addressed  Skin:  Skin Assessment: Reviewed RN Assessment  Last BM:  PTA  Height:   Ht Readings from Last 1 Encounters:  12/03/20 5\' 7"  (1.702 m)    Weight:   Wt Readings from Last 1 Encounters:  12/03/20 47.4 kg    BMI:  Body mass index is 16.37 kg/m.  Estimated Nutritional Needs:   Kcal:  1500-1700 kcal  Protein:  75-90 grams  Fluid:  >/= 1.5 L/day  01/31/21 RD, LDN Clinical Nutrition Pager listed in [facility]ION

## 2020-12-04 NOTE — Progress Notes (Signed)
Inpatient Diabetes Program Recommendations  AACE/ADA: New Consensus Statement on Inpatient Glycemic Control (2015)  Target Ranges:  Prepandial:   less than 140 mg/dL      Peak postprandial:   less than 180 mg/dL (1-2 hours)      Critically ill patients:  140 - 180 mg/dL   Lab Results  Component Value Date   GLUCAP 190 (H) 12/04/2020   HGBA1C 9.7 (H) 12/03/2020    Diabetes history: DM1 Outpatient Diabetes medications: Tresiba 16 units q hs + Apidra tid to cover meals and snacks up to 0-45 Total Daily dose. Inpatient Diabetes medications: Levemir 10 units daily (to start tonight), Novolog 0-15 units TID & 0-5 QHS, Solumedrol 60 mg daily  Inpatient Diabetes Program Recommendations:    IV Insulin has been discontinued.  Please administer Levemir now as he has T1DM and requires basal insulin 2 hrs prior to discontinuing IV insulin.  Called and spoke with Marcelino Duster, RN and will notify MD.    Will continue to follow while inpatient.  Thank you, Dulce Sellar, RN, BSN Diabetes Coordinator Inpatient Diabetes Program [phone] (team pager from 8a-5p)

## 2020-12-04 NOTE — Progress Notes (Addendum)
Progress Note  Chief Complaint:    Gi bleed     ASSESSMENT / PLAN:    # 75 male admitted with AKI / nausea / vomiting / hematemesis with history of gastroparesis. Still having some nausea / vomiting but emesis mainly phlegm, no blood.  CT scan showed circumferential thickening in distal 2/3 of thoracic esophagus c/w esophagitis. He had Grade B reflux esophagitis on EGD at [facility] two months ago after presenting with similar symptoms Biopsies during that admission were negative for CMV. There was a focus of eosinophilia in stomach felt to be reactive. No h.pylori.  --He is getting IV Reglan but only as needed. At home he was taking it before meals.  I'm going to put him on a scheduled dose of 5 mg IV TID AC.  He is recovering from AKI which is reason for 5 mg instead of 10 mg.    --Continue BID IV PPI --Can change above meds to PO form when N/V resolve.  --GI will sign off, call for questions.   # Mild microcytic anemia. Hgb drifted this admission from mid 13 to mid 11. In absence of further GI bleed would expect hgb to remain stable.    # Hx of cardiac transplant. / CVA / IDDM  # Mild constipation --Trial of Dulcolax supp as needed   SUBJECTIVE:   Vomited this am but emesis consisted of phlegm. No further further bleeding. No BMs since Friday.     OBJECTIVE:    Scheduled inpatient medications:  . amLODipine  5 mg Oral Daily  . doxazosin  1 mg Oral QHS  . insulin aspart  0-15 Units Subcutaneous TID WC  . insulin aspart  0-5 Units Subcutaneous QHS  . insulin detemir  10 Units Subcutaneous Daily  . lacosamide  50 mg Oral BID  . methylPREDNISolone (SOLU-MEDROL) injection  6 mg Intravenous Daily  . mycophenolate  720 mg Oral BID  . [START ON 12/06/2020] pantoprazole  40 mg Intravenous Q12H  . pravastatin  40 mg Oral Daily  . sulfamethoxazole-trimethoprim  1 tablet Oral Daily  . Tacrolimus ER  6 mg Oral QAC breakfast   Continuous inpatient infusions:  . sodium chloride    .  lactated ringers 75 mL/hr at 12/04/20 1304   PRN inpatient medications: sodium chloride, acetaminophen **OR** acetaminophen, HYDROcodone-acetaminophen, metoCLOPramide (REGLAN) injection  Vital signs in last 24 hours: Temp:  [98.5 F (36.9 C)-99.3 F (37.4 C)] 98.9 F (37.2 C) (01/10 1152) Pulse Rate:  [94-118] 109 (01/10 1152) Resp:  [11-22] 11 (01/10 1155) BP: (116-176)/(75-104) 176/104 (01/10 1155) SpO2:  [96 %-100 %] 100 % (01/10 1152) Weight:  [47.4 kg] 47.4 kg (01/09 2142)    Intake/Output Summary (Last 24 hours) at 12/04/2020 1417 Last data filed at 12/03/2020 2306 Gross per 24 hour  Intake --  Output 300 ml  Net -300 ml     Physical Exam:  . General: Alert male in NAD . Heart:  Regular rate and rhythm. No lower extremity edema . Pulmonary: Normal respiratory effort . Abdomen: Soft, nondistended, nontender. Normal bowel sounds.  . Neurologic: Alert and oriented . Psych: Pleasant. Cooperative.   Filed Weights   12/03/20 0113 12/03/20 2142  Weight: 52.2 kg 47.4 kg    Intake/Output from previous day: 01/09 0701 - 01/10 0700 In: 1100 [I.V.:1100] Out: 1225 [Urine:1225] Intake/Output this shift: No intake/output data recorded.    Lab Results: Recent Labs    12/03/20 0448 12/03/20 1005 12/04/20 0153  WBC 9.7  9.2 5.5  HGB 13.3 12.1* 11.5*  HCT 39.9 36.7* 33.8*  PLT 364 341 293   BMET Recent Labs    12/03/20 0448 12/03/20 1005 12/04/20 0153  NA 136 135 135  K 3.7 4.3 3.4*  CL 86* 93* 97*  CO2 30 29 25   GLUCOSE 288* 213* 131*  BUN 24* 22* 17  CREATININE 2.20* 1.77* 1.23  CALCIUM 9.3 8.8* 8.7*   LFT Recent Labs    12/03/20 0448  PROT 7.8  ALBUMIN 4.2  AST 29  ALT 23  ALKPHOS 94  BILITOT 0.5   PT/INR Recent Labs    12/02/20 2134  LABPROT 14.2  INR 1.1   Hepatitis Panel No results for input(s): HEPBSAG, HCVAB, HEPAIGM, HEPBIGM in the last 72 hours.  CT HEAD WO CONTRAST  Result Date: 12/03/2020 CLINICAL DATA:  Acute encephalopathy.   Altered mental status. EXAM: CT HEAD WITHOUT CONTRAST TECHNIQUE: Contiguous axial images were obtained from the base of the skull through the vertex without intravenous contrast. COMPARISON:  06/25/2020 and MRI on 02/29/2019 FINDINGS: Brain: No evidence of acute infarction, hemorrhage, hydrocephalus, or extra-axial collection. Old infarcts are again seen in the right posteroinferior cerebellum. A 2.2 cm cyst in the left parieto-occipital region adjacent to the atrium of the left lateral ventricle is stable, consistent with a neural glial cyst. Vascular:  No hyperdense vessel or other acute findings. Skull: No evidence of fracture or other significant bone abnormality. Sinuses/Orbits:  No acute findings. Other: None. IMPRESSION: No acute intracranial abnormality. Stable old right cerebellar infarcts. Stable left parieto-occipital neural glial cyst. Electronically Signed   By: Danae OrleansJohn A Stahl M.D.   On: 12/03/2020 13:09   DG Chest Portable 1 View  Result Date: 12/02/2020 CLINICAL DATA:  Vomiting.  History of heart transplant. EXAM: PORTABLE CHEST 1 VIEW COMPARISON:  October 13, 2020 FINDINGS: The heart size and mediastinal contours are within normal limits. Both lungs are clear. The visualized skeletal structures are unremarkable. The patient is status post prior median sternotomy. IMPRESSION: No active disease. Electronically Signed   By: Katherine Mantlehristopher  Green M.D.   On: 12/02/2020 22:03   ECHOCARDIOGRAM COMPLETE  Result Date: 12/04/2020    ECHOCARDIOGRAM REPORT   Patient Name:   Manuel Baker Date of Exam: 12/04/2020 Medical Rec #:  [id number]     Height:       67.0 in Accession #:    [id number]    Weight:       104.5 lb Date of Birth:  05/02/1996      BSA:          1.535 m Patient Age:    24 years      BP:           146/97 mmHg Patient Gender: M             HR:           97 bpm. Exam Location:  Inpatient Procedure: 2D Echo, Cardiac Doppler and Color Doppler Indications:    Elevated troponin  History:        Patient  has prior history of Echocardiogram examinations, most                 recent 01/06/2018. 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 S/P cardiac transplant.  Sonographer:    Ross LudwigArthur Guy RDCS (AE) Referring Phys: [id number] ANASTASSIA DOUTOVA IMPRESSIONS  1. Left ventricular ejection fraction, by estimation,  is 60 to 65%. The left ventricle has normal function. The left ventricle has no regional wall motion abnormalities. Left ventricular diastolic parameters were normal.  2. Right ventricular systolic function is normal. The right ventricular size is normal. There is normal pulmonary artery systolic pressure. The estimated right ventricular systolic pressure is 30.5 mmHg.  3. Left atrial size was mild to moderately dilated.  4. The mitral valve is normal in structure. Trivial mitral valve regurgitation. No evidence of mitral stenosis.  5. The aortic valve is normal in structure. Aortic valve regurgitation is mild. No aortic stenosis is present.  6. The inferior vena cava is normal in size with greater than 50% respiratory variability, suggesting right atrial pressure of 3 mmHg. FINDINGS  Left Ventricle: Left ventricular ejection fraction, by estimation, is 60 to 65%. The left ventricle has normal function. The left ventricle has no regional wall motion abnormalities. The left ventricular internal cavity size was normal in size. There is  no left ventricular hypertrophy. Left ventricular diastolic parameters were normal. Normal left ventricular filling pressure. Right Ventricle: The right ventricular size is normal. No increase in right ventricular wall thickness. Right ventricular systolic function is normal. There is normal pulmonary artery systolic pressure. The tricuspid regurgitant velocity is 2.62 m/s, and  with an assumed right atrial pressure of 3 mmHg, the estimated right ventricular systolic pressure is 30.5 mmHg. Left Atrium: Left atrial size was mild to moderately dilated. Right  Atrium: Right atrial size was normal in size. Pericardium: There is no evidence of pericardial effusion. Mitral Valve: The mitral valve is normal in structure. Trivial mitral valve regurgitation. No evidence of mitral valve stenosis. MV peak gradient, 5.6 mmHg. The mean mitral valve gradient is 2.0 mmHg. Tricuspid Valve: The tricuspid valve is normal in structure. Tricuspid valve regurgitation is mild . No evidence of tricuspid stenosis. Aortic Valve: The aortic valve is normal in structure. Aortic valve regurgitation is mild. No aortic stenosis is present. Aortic valve mean gradient measures 4.0 mmHg. Aortic valve peak gradient measures 6.6 mmHg. Aortic valve area, by VTI measures 2.64 cm. Pulmonic Valve: The pulmonic valve was normal in structure. Pulmonic valve regurgitation is not visualized. No evidence of pulmonic stenosis. Aorta: The aortic root is normal in size and structure. Venous: The inferior vena cava is normal in size with greater than 50% respiratory variability, suggesting right atrial pressure of 3 mmHg. IAS/Shunts: No atrial level shunt detected by color flow Doppler.  LEFT VENTRICLE PLAX 2D LVIDd:         4.20 cm  Diastology LVIDs:         3.10 cm  LV e' medial:    11.60 cm/s LV PW:         1.10 cm  LV E/e' medial:  8.8 LV IVS:        1.20 cm  LV e' lateral:   16.30 cm/s LVOT diam:     2.15 cm  LV E/e' lateral: 6.3 LV SV:         64 LV SV Index:   41 LVOT Area:     3.63 cm  RIGHT VENTRICLE             IVC RV Basal diam:  3.00 cm     IVC diam: 1.20 cm RV S prime:     10.60 cm/s TAPSE (M-mode): 1.7 cm LEFT ATRIUM             Index       RIGHT ATRIUM  Index LA diam:        2.00 cm 1.30 cm/m  RA Area:     14.10 cm LA Vol (A2C):   72.3 ml 47.11 ml/m RA Volume:   33.50 ml  21.83 ml/m LA Vol (A4C):   58.4 ml 38.05 ml/m LA Biplane Vol: 67.4 ml 43.92 ml/m  AORTIC VALVE AV Area (Vmax):    2.79 cm AV Area (Vmean):   2.59 cm AV Area (VTI):     2.64 cm AV Vmax:           128.00 cm/s AV  Vmean:          90.800 cm/s AV VTI:            0.241 m AV Peak Grad:      6.6 mmHg AV Mean Grad:      4.0 mmHg LVOT Vmax:         98.30 cm/s LVOT Vmean:        64.900 cm/s LVOT VTI:          0.175 m LVOT/AV VTI ratio: 0.73  AORTA Ao Root diam: 2.90 cm Ao Asc diam:  2.70 cm MITRAL VALVE                TRICUSPID VALVE MV Area (PHT): 5.31 cm     TR Peak grad:   27.5 mmHg MV Peak grad:  5.6 mmHg     TR Vmax:        262.00 cm/s MV Mean grad:  2.0 mmHg MV Vmax:       1.18 m/s     SHUNTS MV Vmean:      61.0 cm/s    Systemic VTI:  0.18 m MV Decel Time: 143 msec     Systemic Diam: 2.15 cm MV E velocity: 102.00 cm/s MV A velocity: 78.20 cm/s MV E/A ratio:  1.30 Armanda Magic MD Electronically signed by Armanda Magic MD Signature Date/Time: 12/04/2020/8:57:32 AM    Final    CT CHEST ABDOMEN PELVIS WO CONTRAST  Result Date: 12/02/2020 CLINICAL DATA:  Cardiac transplant in March, recently ran out of gastroparesis medication, chest pain following emesis, concern for esophageal injury EXAM: CT CHEST, ABDOMEN AND PELVIS WITHOUT CONTRAST TECHNIQUE: Multidetector CT imaging of the chest, abdomen and pelvis was performed following the standard protocol without IV contrast. COMPARISON:  Radiograph 12/02/2020, MR thoracic spine 08/28/2018, CT thoracic spine 08/27/2018 FINDINGS: CT CHEST FINDINGS Cardiovascular: Cardiovascular evaluation limited in the absence of contrast media. There are postsurgical changes from prior sternotomy and orthotopic cardiac transplantation. No gross complications are evident within the limitations of this unenhanced CT. Normal cardiac size. No pericardial effusion. Postsurgical changes along the normal caliber aorta. Incidental note made of the left vertebral artery arising directly from the aortic arch between the origins of the left common carotid and subclavian arteries. Aorta is otherwise unremarkable without periaortic stranding fluid. No major venous abnormalities are seen. Central pulmonary arteries  are normal caliber. Luminal evaluation precluded in the absence of contrast media. Mediastinum/Nodes: There is marked circumferential thickening of the distal 2/3 of the thoracic esophagus. Minimal fluid within the esophageal lumen. No extraluminal gas or free fluid is evident however. No acute abnormality of the trachea. Thyroid gland and thoracic inlet are unremarkable. Postsurgical changes anterior mediastinum related to prior sternotomy. No worrisome mediastinal or axillary adenopathy. Hilar nodal evaluation is limited in the absence of intravenous contrast media. Lungs/Pleura: No consolidation, features of edema, pneumothorax, or effusion. No suspicious pulmonary nodules or masses.  Musculoskeletal: Postsurgical changes from prior sternotomy with reinforced sternal sutures which appear intact and aligned as well as remote appearing deformities of several anterior ribs. Anterior wedging compression deformity T12 with up to 20% height loss anteriorly is new from prior. More remote marked vertebral body height loss at T4 with unchanged from prior. Additional milder biconvex deformities T2, T3, T7 and superior endplate deformities at T5 and T6, not significantly changed from comparison imaging. CT ABDOMEN PELVIS FINDINGS Hepatobiliary: No visible focal liver lesion with limitations of this unenhanced CT. Smooth liver surface contour. Normal liver attenuation. Normal gallbladder and biliary tree. Pancreas: No pancreatic ductal dilatation or surrounding inflammatory changes. Spleen: Normal in size. No concerning splenic lesions. Adrenals/Urinary Tract: Normal adrenal glands. Kidneys are normally located with symmetric enhancement and excretion. No suspicious renal lesion, urolithiasis or hydronephrosis. Urinary bladder is unremarkable for the degree of distention. Stomach/Bowel: Distal esophageal thickening, as above. Fluid-filled appearance of the stomach. Duodenum is unremarkable. No small bowel thickening or  dilatation. Air-filled appendix in the right lower quadrant. No colonic dilatation or wall thickening. Vascular/Lymphatic: Limited assessment given absence of contrast media. No significant vascular findings are present. No enlarged abdominal or pelvic lymph nodes. Reproductive: The prostate and seminal vesicles are unremarkable. Other: No abdominopelvic free fluid or free gas. No bowel containing hernias. Musculoskeletal: Mild levocurvature of the lumbar spine, apex L3. No acute osseous abnormality or suspicious osseous lesion. IMPRESSION: 1. Marked circumferential thickening of the distal 2/3 of the thoracic esophagus, suggestive of esophagitis. No extraluminal gas or free fluid to suggest frank perforation. Consider further evaluation with endoscopy as clinically indicated. 2. Postsurgical changes from prior sternotomy and orthotopic cardiac transplantation. No gross complications are evident within the limitations of this unenhanced CT. 3. Anterior wedging compression deformity T12 with up to 20% height loss anteriorly, new from prior. Additional remote compression deformities T2-T7, not significantly changed from comparison imaging. Electronically Signed   By: Kreg Shropshire M.D.   On: 12/02/2020 22:35     LOS: 1 day   Willette Cluster ,NP 12/04/2020, 2:17 PM   ________________________________________________________________________  Corinda Gubler GI MD note:  I personally examined the patient, reviewed the data and agree with the assessment and plan described above.  He has not had any recurrent hematemesis. No need for EGD, he just had one 2-3 months ago at [facility]. Most important will be to keep his DM related gastroparesis controlled as best as possible.    Please call or page with any further questions or concerns.   Rob Bunting, MD [facility] Pager [phone]

## 2020-12-04 NOTE — Evaluation (Signed)
Occupational Therapy Evaluation Patient Details Name: Manuel Baker MRN: [id number] DOB: July 31, 1996 Today's Date: 12/04/2020    History of Present Illness 25 y.o. 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  Patient presented to the ED on 1/8 with complaint of nausea, vomiting, hematemesis.   Clinical Impression   Pt is typically independent at baseline for mobility and ADL. Pt just started job at [employer]. Functional Mobility for ADL  limited by hypertension and N&V. Upon sitting EOB, pt with reports of nausea Pt able to SPT to [facility] but unable to maintain due to nausea/vomiting, and returned supine. Pt able to perform oral care and face/handwashing with set up long sitting in bed. OT to follow acutely, at this time suspect that once nausea/BP under control no OT follow up will be necessary.   Follow Up Recommendations  No OT follow up    Equipment Recommendations  None recommended by OT    Recommendations for Other Services       Precautions / Restrictions Precautions Precautions: Other (comment) Precaution Comments: watch BP      Mobility Bed Mobility Overal bed mobility: Needs Assistance Bed Mobility: Supine to Sit;Sit to Supine     Supine to sit: Supervision Sit to supine: Supervision   General bed mobility comments: supervision for safety    Transfers Overall transfer level: Needs assistance Equipment used: 1 person hand held assist Transfers: Stand Pivot Transfers;Sit to/from Stand Sit to Stand: Min assist Stand pivot transfers: Min assist       General transfer comment: Pt requesting assist due to feeling poorly.    Balance                                           ADL either performed or  assessed with clinical judgement   ADL Overall ADL's : Needs assistance/impaired Eating/Feeding: Independent   Grooming: Set up;Wash/dry face;Oral care;Sitting Grooming Details (indicate cue type and reason): Pt nausea impacts ability to tolerate standing Upper Body Bathing: Set up;Sitting   Lower Body Bathing: Min guard;Sitting/lateral leans   Upper Body Dressing : Modified independent   Lower Body Dressing: Supervision/safety   Toilet Transfer: Minimal assistance;Stand-pivot;BSC Toilet Transfer Details (indicate cue type and reason): min A for balance Toileting- Clothing Manipulation and Hygiene: Min guard;Sit to/from stand       Functional mobility during ADLs: Minimal assistance General ADL Comments: decreased activity tolerance due to nausea, anticipate once that is under control will progress to independent baseline     Vision Patient Visual Report: No change from baseline       Perception     Praxis      Pertinent Vitals/Pain Pain Assessment: Faces Faces Pain Scale: Hurts a little bit Pain Location: chest Pain Descriptors / Indicators: Tightness Pain Intervention(s): Limited activity within patient's tolerance;Monitored during session;Repositioned     Hand Dominance Right   Extremity/Trunk Assessment Upper Extremity Assessment Upper Extremity Assessment: Overall WFL for tasks assessed   Lower Extremity Assessment Lower Extremity Assessment: Overall WFL for tasks assessed   Cervical / Trunk Assessment Cervical / Trunk Assessment: Normal   Communication Communication Communication: No difficulties   Cognition Arousal/Alertness: Awake/alert Behavior During Therapy: WFL for tasks assessed/performed  Overall Cognitive Status: Within Functional Limits for tasks assessed                                     General Comments  BP supine 154/94, BP sitting 143/101, BP after sitting 2 minutes 163/107, BP after return to supine 174/106     Exercises     Shoulder Instructions      Home Living Family/patient expects to be discharged to:: Private residence Living Arrangements: Non-relatives/Friends Available Help at Discharge: Friend(s);Available PRN/intermittently Type of Home: Apartment Home Access: Stairs to enter Entrance Stairs-Number of Steps: 3rd floor apt   Home Layout: One level     Bathroom Shower/Tub: Chief Strategy Officer: Standard     Home Equipment: None          Prior Functioning/Environment Level of Independence: Independent        Comments: Recently started working at [employer].        OT Problem List: Decreased activity tolerance;Cardiopulmonary status limiting activity      OT Treatment/Interventions:      OT Goals(Current goals can be found in the care plan section) Acute Rehab OT Goals Patient Stated Goal: feel better OT Goal Formulation: With patient Time For Goal Achievement: 12/18/20 Potential to Achieve Goals: Good ADL Goals Pt Will Perform Grooming: with modified independence;standing Pt Will Perform Upper Body Dressing: Independently;sitting Pt Will Perform Lower Body Dressing: Independently;sit to/from stand Pt Will Transfer to Toilet: Independently Pt Will Perform Toileting - Clothing Manipulation and hygiene: Independently  OT Frequency: Min 2X/week   Barriers to D/C:            Co-evaluation              AM-PAC OT "6 Clicks" Daily Activity     Outcome Measure Help from another person eating meals?: None Help from another person taking care of personal grooming?: A Little Help from another person toileting, which includes using toliet, bedpan, or urinal?: A Little Help from another person bathing (including washing, rinsing, drying)?: A Little Help from another person to put on and taking off regular upper body clothing?: None Help from another person to put on and taking off regular lower body clothing?: A Little 6 Click Score: 20    End of Session Nurse Communication: Mobility status;Other (comment) (vomiting/BP plateau)  Activity Tolerance: Other (comment) (Patient limited by nausea/vomiting) Patient left: in bed;with call bell/phone within reach;with bed alarm set  OT Visit Diagnosis: Other abnormalities of gait and mobility (R26.89);Muscle weakness (generalized) (M62.81)                Time: 8242-3536 OT Time Calculation (min): 31 min Charges:  OT General Charges $OT Visit: 1 Visit OT Evaluation $OT Eval Moderate Complexity: 1 Mod  Nyoka Cowden OTR/L Acute Rehabilitation Services Pager: [phone] Office: [phone]  Evern Bio Calyn Sivils 12/04/2020, 3:15 PM

## 2020-12-04 NOTE — Progress Notes (Signed)
Mobility Specialist: Progress Note   12/04/20 1711  Mobility  Activity Refused mobility   Pt said he doesn't feel strong enough to walk right now. Encouraged pt to eat some of his dinner and communicated it would make him feel more energized if he eats. Will f/u as able.   [facility] Tasheika Kitzmiller Mobility Specialist

## 2020-12-04 NOTE — Progress Notes (Signed)
PT Cancellation Note  Patient Details Name: Manuel Baker MRN: [id number] DOB: 1996/11/13   Cancelled Treatment:    Reason Eval/Treat Not Completed: Fatigue/lethargy limiting ability to participate. Pt declining PT eval at this time, requesting PT come back later due to not getting any rest last night. Pt does report independence at baseline. PT to re-attempt later today.   Ilda Foil 12/04/2020, 9:44 AM  Aida Raider, PT  Office # [phone] Pager [phone]

## 2020-12-04 NOTE — Progress Notes (Signed)
PROGRESS NOTE  Manuel Baker  DOB: [birth year]  PCP: Demaris Callander [facility] [place] YKD:[id number]  DOA: 12/02/2020  LOS: 1 day   Chief Complaint  Patient presents with  . Hematemesis   Brief narrative: Manuel Baker is a 25 y.o. 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 Patient presented to the ED on 1/8 with complaint of nausea, vomiting, hematemesis. He apparently ran out of Reglan the day before he started having nausea and vomiting.  Over an hour duration, he had 5-6 forceful episodes of vomiting followed by a small amount of bright red blood.  Since being in the ED, patient has not had any vomiting.    In the ED, he was tachycardic to 145, hypertensive with systolics in 178, CBG 584, hemoglobin 14.6--> 13.3 overnight. He was noted to be hypoglycemic, creatinine elevated to 2.51. CT abdomen pelvis did not show anything acute. Labs initially showed potassium low at 2.9 which was replaced. Patient was admitted to hospital service.  GI consultation was obtained.  Subjective: Patient was seen and examined this morning. Young African-American male.  Received Percocet earlier.  He was somnolent at the time of my evaluation.  Says he does not have any appetite.  Even thinking of food makes him nauseous.  Assessment/Plan: Intractable nausea and vomiting Hematemesis History of gastroparesis, GERD, esophagitis -Apparently patient ran out of his Reglan and he started having nausea and vomiting.  After 5-6 forceful episodes in an hour, he had flecks of blood. -GI consultation obtained.  Patient probably had some mucosal tear related to forceful vomiting. -Currently on Protonix 40 mg IV twice daily. -He has not vomited but is nauseous and does not want  to eat.  Encouraged at least clear liquid diet.  Acute metabolic encephalopathy -Continues to remain somnolent this morning.  Received Percocet earlier. -CT scan of head was obtained on 1/9 which did not show any acute process.  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 fib arrest and acute CVA. -Also had ASD repair done at the age of 63. -History of Cheryle Horsfall Sacks endocarditis  -He is chronically immunosuppressed with CellCept, tacrolimus and prednisone.  He is also on Bactrim for prophylaxis. Last episode of acute rejection 02/18/2020 - 03/16/2020  -Tacrolimus level sent on 1/9.  Note yet resulted.   -1/10 echo with EF 60 to 65%. -follows at [facility] team called the unit today for update.  Type 1 diabetes mellitus Hyperglycemia -A1c 9.7 on 1/9 -Initially required insulin drip.  Currently switched to subcutaneous insulin. -Home meds include Tresiba 15 units at bedtime and sliding scale insulin with Accu-Cheks. -Started on Lantus 10 units this morning.  Poor appetite.  Continue to monitor. Recent Labs  Lab 12/04/20 0400 12/04/20 0500 12/04/20 0601 12/04/20 0815 12/04/20 1150  GLUCAP 121* 106* 90 190* 184*   SIRS -Patient had a temperature of 100.2 in the ED.  No episode of fever after that.  He was tachycardic and had elevated creatinine and lactic acid level likely due to dehydration.  I do not see a source of infection. -Sepsis ruled out.  No growth in blood culture in 2 days.  I would stop broad-spectrum antibiotics. Recent Labs  Lab 12/02/20 2015 12/02/20 2212 12/03/20 0130 12/03/20 0300  12/03/20 0448 12/03/20 1005 12/03/20 1218 12/04/20 0153  WBC 9.9  --   --   --  9.7 9.2  --  5.5  LATICACIDVEN  --  3.9* 1.9 4.3*  --   --  1.3  --   PROCALCITON  --   --  0.95  --   --   --   --   --    Acute kidney injury -Due to dehydration.  Creatinine normal at baseline,  presented with 2.51.  Improving.  Slowdown IV fluid to 75 mL/h. Recent Labs    06/15/20 2307 06/25/20 1650 10/13/20 1036 10/13/20 1716 10/13/20 1911 12/02/20 2015 12/03/20 0130 12/03/20 0448 12/03/20 1005 12/04/20 0153  BUN 9 13 15 9 11  26* 23* 24* 22* 17  CREATININE 1.59* 1.02 1.61* 0.85 1.00 2.51* 1.97* 2.20* 1.77* 1.23   Elevated lactic acid level  -likely due to dehydration -Expect improvement with IV fluid. Recent Labs  Lab 12/02/20 2212 12/03/20 0130 12/03/20 0300 12/03/20 1218  LATICACIDVEN 3.9* 1.9 4.3* 1.3   Essential hypertension -Home meds include Amlodipine 5 mg daily, Cardura 1 mg at bedtime -Resume both this morning.  History of CVA -with/out residual deficits.  Continue aspirin 81 mg daily and pravastatin 40 mg daily.   hx of seizure - continue Vimpat   Mobility: Encourage ambulation Code Status:   Code Status: Full Code  Nutritional status: Body mass index is 16.37 kg/m.     Diet Order            Diet Carb Modified Fluid consistency: Thin; Room service appropriate? Yes  Diet effective now                 DVT prophylaxis: SCDs Start: 12/03/20 0332   Antimicrobials:  None Fluid: LR at 75 mL/h. Consultants: GI, GU cardiac transplant team on the phone Family Communication:  Not at bedside  Status is: Inpatient  Remains inpatient appropriate because: Remains somnolent, pending full work-up   Dispo:  Patient From: Home  Planned Disposition: Home  Expected discharge date: 12/05/2020  Medically stable for discharge: No        Infusions:  . sodium chloride    . lactated ringers 125 mL/hr at 12/03/20 0227    Scheduled Meds: . amLODipine  5 mg Oral Daily  . doxazosin  1 mg Oral QHS  . insulin aspart  0-15 Units Subcutaneous TID WC  . insulin aspart  0-5 Units Subcutaneous QHS  . insulin detemir  10 Units Subcutaneous Daily  . lacosamide  50 mg Oral BID  . methylPREDNISolone (SOLU-MEDROL) injection  6 mg Intravenous Daily  .  mycophenolate  720 mg Oral BID  . [START ON 12/06/2020] pantoprazole  40 mg Intravenous Q12H  . pravastatin  40 mg Oral Daily  . sulfamethoxazole-trimethoprim  1 tablet Oral Daily  . Tacrolimus ER  6 mg Oral QAC breakfast    Antimicrobials: Anti-infectives (From admission, onward)   Start     Dose/Rate Route Frequency Ordered Stop   12/04/20 1015  sulfamethoxazole-trimethoprim (BACTRIM) 400-80 MG per tablet 1 tablet        1 tablet Oral Daily 12/04/20 0925     12/04/20 0200  vancomycin (VANCOREADY) IVPB 750 mg/150 mL  Status:  Discontinued        750 mg 150 mL/hr over 60 Minutes Intravenous Every 24 hours 12/03/20 0125 12/04/20 1251   12/04/20 0130  ceFEPIme (MAXIPIME) 2 g in sodium chloride 0.9 % 100 mL IVPB  Status:  Discontinued  2 g 200 mL/hr over 30 Minutes Intravenous Every 24 hours 12/03/20 0125 12/04/20 1251   12/03/20 0130  ceFEPIme (MAXIPIME) 2 g in sodium chloride 0.9 % 100 mL IVPB        2 g 200 mL/hr over 30 Minutes Intravenous  Once 12/03/20 0122 12/03/20 0300   12/03/20 0130  vancomycin (VANCOCIN) IVPB 1000 mg/200 mL premix        1,000 mg 200 mL/hr over 60 Minutes Intravenous  Once 12/03/20 0122 12/03/20 0330      PRN meds: sodium chloride, acetaminophen **OR** acetaminophen, HYDROcodone-acetaminophen, metoCLOPramide (REGLAN) injection   Objective: Vitals:   12/04/20 1152 12/04/20 1155  BP: (!) 171/99 (!) 176/104  Pulse: (!) 109   Resp: 11 11  Temp: 98.9 F (37.2 C)   SpO2: 100%     Intake/Output Summary (Last 24 hours) at 12/04/2020 1253 Last data filed at 12/03/2020 2306 Gross per 24 hour  Intake 1100 ml  Output 300 ml  Net 800 ml   Filed Weights   12/03/20 0113 12/03/20 2142  Weight: 52.2 kg 47.4 kg   Weight change: -4.764 kg Body mass index is 16.37 kg/m.   Physical Exam: General exam: Not in physical distress.  Opens eyes on verbal command.  Able to have a conversation today. Skin: No rashes, lesions or ulcers. HEENT: Atraumatic,  normocephalic, no obvious bleeding Lungs: Clear to auscultation bilaterally CVS: Midsternal scar.  Tachycardic at baseline post cardiac transplant status GI/Abd soft, nontender, nondistended, bowel sound present CNS: Somnolent, opens eyes on verbal command.  No deficits noted. Psychiatry: Unable to examine because of altered mental status Extremities: No pedal edema, no calf tenderness  Data Review: I have personally reviewed the laboratory data and studies available.  Recent Labs  Lab 12/02/20 2015 12/02/20 2255 12/03/20 0448 12/03/20 1005 12/04/20 0153  WBC 9.9  --  9.7 9.2 5.5  NEUTROABS  --   --  7.4  --   --   HGB 13.7 14.6 13.3 12.1* 11.5*  HCT 37.7* 43.0 39.9 36.7* 33.8*  MCV 71.1*  --  74.6* 76.1* 75.6*  PLT 428*  --  364 341 293   Recent Labs  Lab 12/02/20 2015 12/02/20 2255 12/03/20 0130 12/03/20 0448 12/03/20 1005 12/04/20 0153  NA 131* 133* 134* 136 135 135  K 2.9* 2.8* 4.1 3.7 4.3 3.4*  CL 83*  --  88* 86* 93* 97*  CO2 24  --  28 30 29 25   GLUCOSE 572*  --  442* 288* 213* 131*  BUN 26*  --  23* 24* 22* 17  CREATININE 2.51*  --  1.97* 2.20* 1.77* 1.23  CALCIUM 9.6  --  8.3* 9.3 8.8* 8.7*  MG  --   --  1.7  --   --   --   PHOS  --   --  5.2*  --   --   --     F/u labs ordered  Signed, , MD [facility] 12/04/2020

## 2020-12-04 NOTE — Evaluation (Signed)
Physical Therapy Evaluation Patient Details Name: Manuel Baker MRN: [id number] DOB: 12/23/1995 Today's Date: 12/04/2020   History of Present Illness  25 y.o. 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  Patient presented to the ED on 1/8 with complaint of nausea, vomiting, hematemesis.    Clinical Impression  PT eval complete. Mobility limited by hypertension and N&V. Upon sitting EOB, pt with reports of nausea which progressed to vomiting requiring return to supine. PT to follow acutely to further assess mobility. Pt independent and active at baseline. Suspect no follow up services indicated.    Follow Up Recommendations No PT follow up    Equipment Recommendations  None recommended by PT    Recommendations for Other Services       Precautions / Restrictions Precautions Precautions: Other (comment) Precaution Comments: watch BP      Mobility  Bed Mobility Overal bed mobility: Needs Assistance Bed Mobility: Supine to Sit;Sit to Supine     Supine to sit: Supervision Sit to supine: Supervision   General bed mobility comments: supervision for safety    Transfers Overall transfer level: Needs assistance Equipment used: None Transfers: Stand Pivot Transfers;Sit to/from Stand Sit to Stand: Min assist Stand pivot transfers: Min assist       General transfer comment: Pt requesting assist due to feeling poorly.  Ambulation/Gait             General Gait Details: unable to progress due to hypertension, N&V.  Stairs            Wheelchair Mobility    Modified Rankin (Stroke Patients Only)       Balance                                             Pertinent Vitals/Pain Pain Assessment: Faces Faces Pain  Scale: Hurts a little bit Pain Location: chest Pain Descriptors / Indicators: Tightness Pain Intervention(s): Limited activity within patient's tolerance;Monitored during session    Home Living Family/patient expects to be discharged to:: Private residence Living Arrangements: Non-relatives/Friends Available Help at Discharge: Friend(s);Available PRN/intermittently Type of Home: Apartment Home Access: Stairs to enter   Entrance Stairs-Number of Steps: 3rd floor apt Home Layout: One level Home Equipment: None      Prior Function Level of Independence: Independent         Comments: Recently started working at [employer].     Hand Dominance   Dominant Hand: Right    Extremity/Trunk Assessment   Upper Extremity Assessment Upper Extremity Assessment: Overall WFL for tasks assessed    Lower Extremity Assessment Lower Extremity Assessment: Overall WFL for tasks assessed    Cervical / Trunk Assessment Cervical / Trunk Assessment: Normal  Communication   Communication: No difficulties  Cognition Arousal/Alertness: Awake/alert Behavior During Therapy: WFL for tasks assessed/performed Overall Cognitive Status: Within Functional Limits for tasks assessed                                        General Comments General comments (skin integrity, edema, etc.): BP supine 154/94, BP sitting 143/101, BP after sitting 2  minutes 163/107, BP after return to supine 174/106    Exercises     Assessment/Plan    PT Assessment Patient needs continued PT services  PT Problem List Decreased mobility;Decreased activity tolerance;Pain       PT Treatment Interventions Therapeutic activities;Gait training;Patient/family education;Stair training;Functional mobility training    PT Goals (Current goals can be found in the Care Plan section)  Acute Rehab PT Goals Patient Stated Goal: feel better PT Goal Formulation: With patient Time For Goal Achievement:  12/18/20 Potential to Achieve Goals: Good    Frequency Min 3X/week   Barriers to discharge        Co-evaluation               AM-PAC PT "6 Clicks" Mobility  Outcome Measure Help needed turning from your back to your side while in a flat bed without using bedrails?: None Help needed moving from lying on your back to sitting on the side of a flat bed without using bedrails?: A Little Help needed moving to and from a bed to a chair (including a wheelchair)?: A Little Help needed standing up from a chair using your arms (e.g., wheelchair or bedside chair)?: A Little Help needed to walk in hospital room?: A Little Help needed climbing 3-5 steps with a railing? : A Little 6 Click Score: 19    End of Session   Activity Tolerance: Treatment limited secondary to medical complications (Comment) (HTN, N&V) Patient left: in bed;with call bell/phone within reach Nurse Communication: Mobility status;Other (comment) (HTN, N&V) PT Visit Diagnosis: Other abnormalities of gait and mobility (R26.89)    Time: 1610-9604 PT Time Calculation (min) (ACUTE ONLY): 18 min   Charges:   PT Evaluation $PT Eval Low Complexity: 1 Low          Aida Raider, PT  Office # [phone] Pager [phone]   Ilda Foil 12/04/2020, 1:55 PM

## 2020-12-05 ENCOUNTER — Encounter (HOSPITAL_COMMUNITY): Payer: Self-pay | Admitting: Internal Medicine

## 2020-12-05 DIAGNOSIS — E1143 Type 2 diabetes mellitus with diabetic autonomic (poly)neuropathy: Secondary | ICD-10-CM | POA: Diagnosis not present

## 2020-12-05 DIAGNOSIS — E860 Dehydration: Secondary | ICD-10-CM | POA: Diagnosis not present

## 2020-12-05 DIAGNOSIS — E43 Unspecified severe protein-calorie malnutrition: Secondary | ICD-10-CM | POA: Insufficient documentation

## 2020-12-05 DIAGNOSIS — K3184 Gastroparesis: Secondary | ICD-10-CM | POA: Diagnosis not present

## 2020-12-05 LAB — BASIC METABOLIC PANEL
Anion gap: 11 (ref 5–15)
BUN: 12 mg/dL (ref 6–20)
CO2: 25 mmol/L (ref 22–32)
Calcium: 8.9 mg/dL (ref 8.9–10.3)
Chloride: 101 mmol/L (ref 98–111)
Creatinine, Ser: 1.18 mg/dL (ref 0.61–1.24)
GFR, Estimated: >60 mL/min (ref >60)
Glucose, Bld: 80 mg/dL (ref 70–99)
Potassium: 3.4 mmol/L — ABNORMAL LOW (ref 3.5–5.1)
Sodium: 137 mmol/L (ref 135–145)

## 2020-12-05 LAB — GLUCOSE, CAPILLARY
Glucose-Capillary: 210 mg/dL — ABNORMAL HIGH (ref 70–99)
Glucose-Capillary: 137 mg/dL — ABNORMAL HIGH (ref 70–99)
Glucose-Capillary: 346 mg/dL — ABNORMAL HIGH (ref 70–99)
Glucose-Capillary: 116 mg/dL — ABNORMAL HIGH (ref 70–99)
Glucose-Capillary: 48 mg/dL — ABNORMAL LOW (ref 70–99)

## 2020-12-05 LAB — TACROLIMUS LEVEL: Tacrolimus (FK506) - LabCorp: 6.3 ng/mL (ref 2.0–20.0)

## 2020-12-05 LAB — CBC
HCT: 33.9 % — ABNORMAL LOW (ref 39.0–52.0)
Hemoglobin: 11.1 g/dL — ABNORMAL LOW (ref 13.0–17.0)
MCH: 24.8 pg — ABNORMAL LOW (ref 26.0–34.0)
MCHC: 32.7 g/dL (ref 30.0–36.0)
MCV: 75.7 fL — ABNORMAL LOW (ref 80.0–100.0)
Platelets: 251 10*3/uL (ref 150–400)
RBC: 4.48 MIL/uL (ref 4.22–5.81)
RDW: 14.2 % (ref 11.5–15.5)
WBC: 4 10*3/uL (ref 4.0–10.5)
nRBC: 0 % (ref 0.0–0.2)

## 2020-12-05 MED ORDER — MORPHINE SULFATE (PF) 2 MG/ML IV SOLN
1.0000 mg | Freq: Four times a day (QID) | INTRAVENOUS | Status: DC | PRN
  Administered 2020-12-05: 1 mg via INTRAVENOUS
  Filled 2020-12-05: qty 1

## 2020-12-05 MED ORDER — METOCLOPRAMIDE HCL 5 MG/ML IJ SOLN
5.0000 mg | Freq: Three times a day (TID) | INTRAMUSCULAR | Status: DC | PRN
  Administered 2020-12-05: 5 mg via INTRAVENOUS
  Filled 2020-12-05: qty 2

## 2020-12-05 MED ORDER — INSULIN ASPART 100 UNIT/ML ~~LOC~~ SOLN
2.0000 [IU] | Freq: Three times a day (TID) | SUBCUTANEOUS | Status: DC
  Administered 2020-12-05 – 2020-12-07 (×6): 2 [IU] via SUBCUTANEOUS

## 2020-12-05 MED ORDER — METOCLOPRAMIDE HCL 5 MG/ML IJ SOLN
10.0000 mg | Freq: Three times a day (TID) | INTRAMUSCULAR | Status: DC
  Administered 2020-12-06 – 2020-12-07 (×6): 10 mg via INTRAVENOUS
  Filled 2020-12-05 (×6): qty 2

## 2020-12-05 MED ORDER — INSULIN DETEMIR 100 UNIT/ML ~~LOC~~ SOLN
12.0000 [IU] | Freq: Every day | SUBCUTANEOUS | Status: DC
  Administered 2020-12-06 – 2020-12-07 (×2): 12 [IU] via SUBCUTANEOUS
  Filled 2020-12-05 (×2): qty 0.12

## 2020-12-05 MED ORDER — MORPHINE SULFATE (PF) 2 MG/ML IV SOLN
2.0000 mg | Freq: Four times a day (QID) | INTRAVENOUS | Status: DC | PRN
  Administered 2020-12-05: 2 mg via INTRAVENOUS
  Filled 2020-12-05: qty 1

## 2020-12-05 MED ORDER — POTASSIUM CHLORIDE CRYS ER 20 MEQ PO TBCR
40.0000 meq | EXTENDED_RELEASE_TABLET | Freq: Once | ORAL | Status: AC
  Administered 2020-12-05: 40 meq via ORAL
  Filled 2020-12-05: qty 2

## 2020-12-05 NOTE — Progress Notes (Addendum)
Inpatient Diabetes Program Recommendations  AACE/ADA: New Consensus Statement on Inpatient Glycemic Control (2015)  Target Ranges:  Prepandial:   less than 140 mg/dL      Peak postprandial:   less than 180 mg/dL (1-2 hours)      Critically ill patients:  140 - 180 mg/dL   Lab Results  Component Value Date   GLUCAP 210 (H) 12/05/2020   HGBA1C 9.7 (H) 12/03/2020    Review of Glycemic Control  Results for JULES, BATY (MRN [id number]) as of 12/05/2020 09:17  Ref. Range 12/04/2020 08:15 12/04/2020 11:50 12/04/2020 16:46 12/04/2020 22:32 12/05/2020 06:45  Glucose-Capillary Latest Ref Range: 70 - 99 mg/dL 456 (H) 256 (H) 389 (H) 112 (H) 210 (H)   Diabetes history:  DM1(does not make insulin.  Needs correction, basal and meal coverage) Outpatient Diabetes medications: Tresiba 16 units q hs + Apidra tid to cover meals and snacks up to 0-45 Total Daily dose. Current orders for Inpatient glycemic control:  Levemir 10 daily Novolog 0-15 units TID & 0-5 units QHS   Inpatient Diabetes Program Recommendations:    Please consider:  Levemir 12 units daily Novolog 2-3 units TID with meals if eats at least 50% Add carb modified to diet  Will continue to follow while inpatient.  Thank you, Dulce Sellar, RN, BSN Diabetes Coordinator Inpatient Diabetes Program [phone] (team pager from 8a-5p)

## 2020-12-05 NOTE — Plan of Care (Signed)

## 2020-12-05 NOTE — Progress Notes (Signed)
PROGRESS NOTE  Manuel Baker  DOB: 1996-08-03  PCP: Demaris Callander [facility] [place] MGQ:[id number]  DOA: 12/02/2020  LOS: 2 days   Chief Complaint  Patient presents with  . Hematemesis   Brief narrative: Manuel Baker is a 25 y.o. 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 Patient presented to the ED on 1/8 with complaint of nausea, vomiting, hematemesis. He apparently ran out of Reglan the day before he started having nausea and vomiting.  Over an hour duration, he had 5-6 forceful episodes of vomiting followed by a small amount of bright red blood.  Since being in the ED, patient has not had any vomiting.    In the ED, he was tachycardic to 145, hypertensive with systolics in 178, CBG 584, hemoglobin 14.6--> 13.3 overnight. He was noted to be hypoglycemic, creatinine elevated to 2.51. CT abdomen pelvis did not show anything acute. Labs initially showed potassium low at 2.9 which was replaced. Patient was admitted to hospital service.  GI consultation was obtained.  Subjective: Patient was seen and examined this morning. Continues to be nauseated.  Does not want to try any food.  I had a long conversation with him.  Encouraged to try oral intake. GI signed off.  Assessment/Plan: Intractable nausea and vomiting Hematemesis History of gastroparesis, GERD, esophagitis -Apparently patient ran out of his Reglan and he started having nausea and vomiting.  After 5-6 forceful episodes in an hour, he had flecks of blood. -GI consultation obtained.  Patient probably had some mucosal tear related to forceful vomiting. -Currently on Protonix 40 mg IV twice daily as well as Reglan IV scheduled.  No need of inpatient intervention.  GI signed off. -Will be  encouraging patient to try something oral.  Patient wants to try today.  Acute metabolic encephalopathy -Mental status improved. -CT scan of head was obtained on 1/9 which did not show any acute process.  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 fib arrest and acute CVA. -Also had ASD repair done at the age of 71. -History of Cheryle Horsfall Sacks endocarditis  -He is chronically immunosuppressed with CellCept, tacrolimus and prednisone.  He is also on Bactrim for prophylaxis. Last episode of acute rejection 02/18/2020 - 03/16/2020  -Tacrolimus level sent on 1/9.  Note yet resulted.   -1/10 echo with EF 60 to 65%. -follows at [facility] transplant team called the unit today for update.  Type 1 diabetes mellitus Hyperglycemia -A1c 9.7 on 1/9 -Initially required insulin drip.  Currently switched to subcutaneous insulin. -Home meds include Tresiba 15 units at bedtime and sliding scale insulin with Accu-Cheks. -Currently on Lantus 10 units.  Increase to 12 units.  Poor appetite.  Continue to monitor. Recent Labs  Lab 12/04/20 0815 12/04/20 1150 12/04/20 1646 12/04/20 2232 12/05/20 0645  GLUCAP 190* 184* 231* 112* 210*   SIRS -Patient had a temperature of 100.2 in the ED.  No episode of fever after that.  He was tachycardic and had elevated creatinine and lactic acid level likely due to dehydration.  I do not see a source of infection. -Sepsis ruled out.  No growth in blood culture in 2 days.  I would stop broad-spectrum antibiotics. Recent Labs  Lab 12/02/20 2015 12/02/20 2212 12/03/20 0130  12/03/20 0300 12/03/20 0448 12/03/20 1005 12/03/20 1218 12/04/20 0153 12/05/20 0113  WBC 9.9  --   --   --  9.7 9.2  --  5.5 4.0  LATICACIDVEN  --  3.9* 1.9 4.3*  --   --  1.3  --   --   PROCALCITON  --   --  0.95  --   --   --   --   --   --    Acute kidney injury -Due to dehydration.  Creatinine normal at  baseline, presented with 2.51.  Improving.  Currently on 75 mL/h. Recent Labs    06/25/20 1650 10/13/20 1036 10/13/20 1716 10/13/20 1911 12/02/20 2015 12/03/20 0130 12/03/20 0448 12/03/20 1005 12/04/20 0153 12/05/20 0113  BUN 13 15 9 11  26* 23* 24* 22* 17 12  CREATININE 1.02 1.61* 0.85 1.00 2.51* 1.97* 2.20* 1.77* 1.23 1.18   Elevated lactic acid level  -likely due to dehydration -Expect improvement with IV fluid. Recent Labs  Lab 12/02/20 2212 12/03/20 0130 12/03/20 0300 12/03/20 1218  LATICACIDVEN 3.9* 1.9 4.3* 1.3   Essential hypertension -Home meds include Amlodipine 5 mg daily, Cardura 1 mg at bedtime -Resume both this morning.  History of CVA -with/out residual deficits.  Continue aspirin 81 mg daily and pravastatin 40 mg daily.   hx of seizure - continue Vimpat   Mobility: Encourage ambulation Code Status:   Code Status: Full Code  Nutritional status: Body mass index is 16.37 kg/m. Nutrition Problem: Severe Malnutrition Etiology: chronic illness (gastroparesis, hx cardiac transplant) Signs/Symptoms: moderate fat depletion,severe muscle depletion,percent weight loss Diet Order            Diet regular Room service appropriate? Yes; Fluid consistency: Thin  Diet effective now                 DVT prophylaxis: SCDs Start: 12/03/20 0332   Antimicrobials:  None Fluid: LR at 75 mL/h. Consultants: GI, GU cardiac transplant team on the phone Family Communication:  Discussed with patient's grandmother on the phone.  Status is: Inpatient  Remains inpatient appropriate because: Remains somnolent, pending full work-up  Dispo:  Patient From: Home  Planned Disposition: Home  Expected discharge date: 12/06/2020  Medically stable for discharge: No   Infusions:  . sodium chloride    . lactated ringers 75 mL/hr at 12/05/20 0122    Scheduled Meds: . amLODipine  5 mg Oral Daily  . doxazosin  1 mg Oral QHS  . feeding supplement  237 mL Oral TID BM  .  insulin aspart  0-15 Units Subcutaneous TID WC  . insulin aspart  0-5 Units Subcutaneous QHS  . insulin aspart  2 Units Subcutaneous TID WC  . [START ON 12/06/2020] insulin detemir  12 Units Subcutaneous Daily  . lacosamide  50 mg Oral BID  . methylPREDNISolone (SOLU-MEDROL) injection  6 mg Intravenous Daily  . metoCLOPramide (REGLAN) injection  5 mg Intravenous TID AC  . multivitamin with minerals  1 tablet Oral Daily  . mycophenolate  720 mg Oral BID  . [START ON 12/06/2020] pantoprazole  40 mg Intravenous Q12H  . pravastatin  40 mg Oral Daily  . sulfamethoxazole-trimethoprim  1 tablet Oral Daily  . Tacrolimus ER  6 mg Oral QAC breakfast    Antimicrobials: Anti-infectives (From admission, onward)   Start     Dose/Rate Route Frequency Ordered Stop   12/04/20 1015  sulfamethoxazole-trimethoprim (BACTRIM) 400-80 MG per tablet 1 tablet        1  tablet Oral Daily 12/04/20 0925     12/04/20 0200  vancomycin (VANCOREADY) IVPB 750 mg/150 mL  Status:  Discontinued        750 mg 150 mL/hr over 60 Minutes Intravenous Every 24 hours 12/03/20 0125 12/04/20 1251   12/04/20 0130  ceFEPIme (MAXIPIME) 2 g in sodium chloride 0.9 % 100 mL IVPB  Status:  Discontinued        2 g 200 mL/hr over 30 Minutes Intravenous Every 24 hours 12/03/20 0125 12/04/20 1251   12/03/20 0130  ceFEPIme (MAXIPIME) 2 g in sodium chloride 0.9 % 100 mL IVPB        2 g 200 mL/hr over 30 Minutes Intravenous  Once 12/03/20 0122 12/03/20 0300   12/03/20 0130  vancomycin (VANCOCIN) IVPB 1000 mg/200 mL premix        1,000 mg 200 mL/hr over 60 Minutes Intravenous  Once 12/03/20 0122 12/03/20 0330      PRN meds: sodium chloride, acetaminophen **OR** acetaminophen, bisacodyl, HYDROcodone-acetaminophen, metoCLOPramide (REGLAN) injection   Objective: Vitals:   12/05/20 0520 12/05/20 0910  BP: (!) 150/92 (!) 160/95  Pulse: 100 (!) 106  Resp: 18 19  Temp: 99.5 F (37.5 C) 99.6 F (37.6 C)  SpO2: 100% 99%    Intake/Output  Summary (Last 24 hours) at 12/05/2020 1110 Last data filed at 12/05/2020 0445 Gross per 24 hour  Intake 1733.76 ml  Output 200 ml  Net 1533.76 ml   Filed Weights   12/03/20 0113 12/03/20 2142  Weight: 52.2 kg 47.4 kg   Weight change:  Body mass index is 16.37 kg/m.   Physical Exam: General exam: Not in physical distress at rest.   Skin: No rashes, lesions or ulcers. HEENT: Atraumatic, normocephalic, no obvious bleeding Lungs: Clear to auscultation bilaterally CVS: Midsternal scar.  Tachycardic at baseline post cardiac transplant status GI/Abd soft, mild epigastric tenderness, nondistended, bowel sound present CNS: Alert, awake, oriented x3. Psychiatry: Unable to examine because of altered mental status Extremities: No pedal edema, no calf tenderness  Data Review: I have personally reviewed the laboratory data and studies available.  Recent Labs  Lab 12/02/20 2015 12/02/20 2255 12/03/20 0448 12/03/20 1005 12/04/20 0153 12/05/20 0113  WBC 9.9  --  9.7 9.2 5.5 4.0  NEUTROABS  --   --  7.4  --   --   --   HGB 13.7 14.6 13.3 12.1* 11.5* 11.1*  HCT 37.7* 43.0 39.9 36.7* 33.8* 33.9*  MCV 71.1*  --  74.6* 76.1* 75.6* 75.7*  PLT 428*  --  364 341 293 251   Recent Labs  Lab 12/03/20 0130 12/03/20 0448 12/03/20 1005 12/04/20 0153 12/05/20 0113  NA 134* 136 135 135 137  K 4.1 3.7 4.3 3.4* 3.4*  CL 88* 86* 93* 97* 101  CO2 28 30 29 25 25   GLUCOSE 442* 288* 213* 131* 80  BUN 23* 24* 22* 17 12  CREATININE 1.97* 2.20* 1.77* 1.23 1.18  CALCIUM 8.3* 9.3 8.8* 8.7* 8.9  MG 1.7  --   --   --   --   PHOS 5.2*  --   --   --   --     F/u labs ordered  Signed, , MD [facility] 12/05/2020

## 2020-12-05 NOTE — Progress Notes (Addendum)
Physical Therapy Treatment Patient Details Name: Manuel Baker MRN: [id number] DOB: [birth year] Today's Date: 12/05/2020    History of Present Illness 25 y.o. 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  Patient presented to the ED on 1/8 with complaint of nausea, vomiting, hematemesis.    PT Comments    Pt supine in bed on arrival this session.  He required max cues for encouragement to participate in PT session and progress ambulation.  Pt educated on increasing OOB activity this session.  He will likely return to baseline the more he mobilizes.  Continue to recommend no PT f/u.     No PT follow up     Equipment Recommendations  None recommended by PT    Recommendations for Other Services       Precautions / Restrictions Precautions Precautions: Other (comment) Precaution Comments: watch BP Restrictions Weight Bearing Restrictions: No    Mobility  Bed Mobility Overal bed mobility: Needs Assistance;Modified Independent Bed Mobility: Supine to Sit;Sit to Supine     Supine to sit: Modified independent (Device/Increase time) Sit to supine: Modified independent (Device/Increase time)      Transfers Overall transfer level: Needs assistance Equipment used: 1 person hand held assist (reaching for assistance once in standing.  Mild unsteadiness.) Transfers: Sit to/from Stand Sit to Stand: Min guard Stand pivot transfers: Min guard       General transfer comment: Pt requesting assist due to feeling poorly.  Pt required L HHA.  Ambulation/Gait Ambulation/Gait assistance: Min guard Gait Distance (Feet): 200 Feet Assistive device: 1 person hand held assist Gait Pattern/deviations: Step-through pattern;Trunk flexed     General Gait Details:  Cues for increasing stride.  Pt holding PTA hands for support.  Pt mildly unsteady but no overt LOB.  He remains guarded.  Noted to belch in during gt in halls so likely abdominal pain is gas related.   Stairs             Wheelchair Mobility    Modified Rankin (Stroke Patients Only)       Balance Overall balance assessment: Needs assistance                                          Cognition Arousal/Alertness: Awake/alert Behavior During Therapy: WFL for tasks assessed/performed Overall Cognitive Status: Within Functional Limits for tasks assessed                                        Exercises      General Comments        Pertinent Vitals/Pain Pain Assessment: Faces Faces Pain Scale: Hurts a little bit Pain Location: stomach Pain Descriptors / Indicators: Tightness Pain Intervention(s): Monitored during session    Home Living                      Prior Function            PT Goals (current goals can now be found in the care plan section) Acute Rehab PT Goals Patient Stated Goal: feel better Potential to Achieve Goals: Good Progress towards PT  goals: Progressing toward goals    Frequency    Min 3X/week      PT Plan Current plan remains appropriate    Co-evaluation              AM-PAC PT "6 Clicks" Mobility   Outcome Measure  Help needed turning from your back to your side while in a flat bed without using bedrails?: None Help needed moving from lying on your back to sitting on the side of a flat bed without using bedrails?: None Help needed moving to and from a bed to a chair (including a wheelchair)?: A Little Help needed standing up from a chair using your arms (e.g., wheelchair or bedside chair)?: A Little Help needed to walk in hospital room?: A Little Help needed climbing 3-5 steps with a railing? : A Little 6 Click Score: 20    End of Session   Activity Tolerance: Patient tolerated  treatment well Patient left: in bed;with call bell/phone within reach Nurse Communication: Mobility status;Patient requests pain meds PT Visit Diagnosis: Other abnormalities of gait and mobility (R26.89)     Time: 9563-8756 PT Time Calculation (min) (ACUTE ONLY): 11 min  Charges:  $Gait Training: 8-22 mins                     Manuel Baker , PTA Acute Rehabilitation Services Pager [phone] Office [phone]     Manuel Baker 12/05/2020, 5:11 PM

## 2020-12-06 DIAGNOSIS — E860 Dehydration: Secondary | ICD-10-CM | POA: Diagnosis not present

## 2020-12-06 LAB — CMV DNA, QUANTITATIVE, PCR
CMV DNA Quant: POSITIVE IU/mL
Log10 CMV Qn DNA Pl: UNDETERMINED log10 IU/mL

## 2020-12-06 LAB — BASIC METABOLIC PANEL
Anion gap: 8 (ref 5–15)
BUN: 8 mg/dL (ref 6–20)
CO2: 25 mmol/L (ref 22–32)
Calcium: 8.6 mg/dL — ABNORMAL LOW (ref 8.9–10.3)
Chloride: 100 mmol/L (ref 98–111)
Creatinine, Ser: 1.09 mg/dL (ref 0.61–1.24)
GFR, Estimated: >60 mL/min (ref >60)
Glucose, Bld: 180 mg/dL — ABNORMAL HIGH (ref 70–99)
Potassium: 3.8 mmol/L (ref 3.5–5.1)
Sodium: 133 mmol/L — ABNORMAL LOW (ref 135–145)

## 2020-12-06 LAB — CBC
HCT: 31.7 % — ABNORMAL LOW (ref 39.0–52.0)
Hemoglobin: 10.5 g/dL — ABNORMAL LOW (ref 13.0–17.0)
MCH: 24.9 pg — ABNORMAL LOW (ref 26.0–34.0)
MCHC: 33.1 g/dL (ref 30.0–36.0)
MCV: 75.1 fL — ABNORMAL LOW (ref 80.0–100.0)
Platelets: 211 10*3/uL (ref 150–400)
RBC: 4.22 MIL/uL (ref 4.22–5.81)
RDW: 14 % (ref 11.5–15.5)
WBC: 3.6 10*3/uL — ABNORMAL LOW (ref 4.0–10.5)
nRBC: 0 % (ref 0.0–0.2)

## 2020-12-06 LAB — GLUCOSE, CAPILLARY
Glucose-Capillary: 221 mg/dL — ABNORMAL HIGH (ref 70–99)
Glucose-Capillary: 143 mg/dL — ABNORMAL HIGH (ref 70–99)
Glucose-Capillary: 198 mg/dL — ABNORMAL HIGH (ref 70–99)
Glucose-Capillary: 57 mg/dL — ABNORMAL LOW (ref 70–99)
Glucose-Capillary: 135 mg/dL — ABNORMAL HIGH (ref 70–99)

## 2020-12-06 MED ORDER — ASPIRIN EC 81 MG PO TBEC
81.0000 mg | DELAYED_RELEASE_TABLET | Freq: Every day | ORAL | Status: DC
  Administered 2020-12-06 – 2020-12-07 (×2): 81 mg via ORAL
  Filled 2020-12-06 (×2): qty 1

## 2020-12-06 MED ORDER — MELATONIN 5 MG PO TABS
5.0000 mg | ORAL_TABLET | Freq: Every evening | ORAL | Status: DC | PRN
  Administered 2020-12-06: 5 mg via ORAL
  Filled 2020-12-06: qty 1

## 2020-12-06 NOTE — Progress Notes (Signed)
PROGRESS NOTE  Manuel Baker  DOB: April 25, 1996  PCP: Demaris Callander [facility] [place] WER:[id number]  DOA: 12/02/2020  LOS: 3 days   Chief Complaint  Patient presents with  . Hematemesis   Brief narrative: Manuel Baker is a 25 y.o. 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 Patient presented to the ED on 1/8 with complaint of nausea, vomiting, hematemesis. He apparently ran out of Reglan the day before he started having nausea and vomiting.  Over an hour duration, he had 5-6 forceful episodes of vomiting followed by a small amount of bright red blood.  Since being in the ED, patient has not had any vomiting.    In the ED, he was tachycardic to 145, hypertensive with systolics in 178, CBG 584, hemoglobin 14.6--> 13.3 overnight. He was noted to be hypoglycemic, creatinine elevated to 2.51. CT abdomen pelvis did not show anything acute. Labs initially showed potassium low at 2.9 which was replaced. Patient was admitted to hospital service.  GI consultation was obtained.  Subjective: Patient was seen and examined this morning. Sleeping, woke up on verbal command.  Not hurting this morning.  Wants to try food.  I will increase only clear liquid diet with IV Reglan.  We had a long conversation about adjustment of pain meds, nausea meds and oral appetite.  Patient is agreeable to go along with the plan.  I would stop IV morphine at this time.  Assessment/Plan: Intractable nausea and vomiting History of gastroparesis, GERD, esophagitis -Apparently patient ran out of his Reglan and he started having nausea and vomiting.   -Patient continues to have pain with eating.  We have been adjusting his pain medicine regimen dosing.  Currently have him on scheduled  Reglan IV as well as as needed.  I would minimize the use of IV opioids.  Would encourage clear liquid diet only for now.  Mild hematemesis -At home, after 5-6 forceful episodes in an hour, he had flecks of blood. -GI consultation was obtained. Patient probably had some mucosal tear related to forceful vomiting.  Hemoglobin is stable. -Currently on Protonix 40 mg IV twice daily as well as Reglan IV scheduled.  No need of inpatient intervention. GI signed off.  Acute metabolic encephalopathy -Mental status improved. -CT scan of head was obtained on 1/9 which did not show any acute process.  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 fib arrest and acute CVA. -Also had ASD repair done at the age of 61. -History of Cheryle Horsfall Sacks endocarditis  -He is chronically immunosuppressed with CellCept, tacrolimus and prednisone.  He is also on Bactrim for prophylaxis. Last episode of acute rejection 02/18/2020 - 03/16/2020  -Tacrolimus level sent on 1/9 resulted in range at 6.3 (therapeutic range 2-20) -1/10 echo with EF 60 to 65%. -follows at [facility] transplant team has been calling to check on him. I donot see any need of transfer to [facility] at the time.  Type 1 diabetes mellitus Hyperglycemia -A1c 9.7 on 1/9 -Initially required insulin drip. Currently switched to subcutaneous insulin. -Home meds include Tresiba 15 units at bedtime and sliding scale insulin with Accu-Cheks. -Currently on Lantus 12 units daily and NovoLog Premeal 2 units 3 times daily along with sliding scale insulin. -Blood sugar  level was low at 48 last night, elevated to 221 this morning.  Sugar level likely to be inconsistent as long as patient does not have consistent oral intake. Recent Labs  Lab 12/05/20 1253 12/05/20 1604 12/05/20 2131 12/05/20 2215 12/06/20 0607  GLUCAP 137* 346* 48* 116* 221*   SIRS -Patient had a temperature of  100.2 in the ED.  No episode of fever after that.  He was tachycardic and had elevated creatinine and lactic acid level likely due to dehydration.  I do not see a source of infection. Sepsis ruled out.  No growth in blood culture in 2 days.  Broad-spectrum antibiotics have been stopped. Recent Labs  Lab 12/02/20 2212 12/03/20 0130 12/03/20 0300 12/03/20 0448 12/03/20 1005 12/03/20 1218 12/04/20 0153 12/05/20 0113 12/06/20 0405  WBC  --   --   --  9.7 9.2  --  5.5 4.0 3.6*  LATICACIDVEN 3.9* 1.9 4.3*  --   --  1.3  --   --   --   PROCALCITON  --  0.95  --   --   --   --   --   --   --    Acute kidney injury -Due to dehydration.  Creatinine normal at baseline, presented with 2.51.  Improving.  Currently on 75 mL/h. Recent Labs    10/13/20 1036 10/13/20 1716 10/13/20 1911 12/02/20 2015 12/03/20 0130 12/03/20 0448 12/03/20 1005 12/04/20 0153 12/05/20 0113 12/06/20 0405  BUN 15 9 11  26* 23* 24* 22* 17 12 8   CREATININE 1.61* 0.85 1.00 2.51* 1.97* 2.20* 1.77* 1.23 1.18 1.09   Elevated lactic acid level  -likely due to dehydration -Improved with hydration. Recent Labs  Lab 12/02/20 2212 12/03/20 0130 12/03/20 0300 12/03/20 1218  LATICACIDVEN 3.9* 1.9 4.3* 1.3   Essential hypertension -Home meds include Amlodipine 5 mg daily, Cardura 1 mg at bedtime -Continue both.  History of CVA -with/out residual deficits.  Continue aspirin 81 mg daily and pravastatin 40 mg daily.   hx of seizure - continue Vimpat   Mobility: Encourage ambulation Code Status:   Code Status: Full Code  Nutritional status: Body mass index is 16.37 kg/m. Nutrition Problem: Severe Malnutrition Etiology: chronic illness (gastroparesis, hx cardiac transplant) Signs/Symptoms: moderate fat depletion,severe muscle depletion,percent weight loss Diet Order            Diet clear liquid Room service appropriate? Yes; Fluid consistency: Thin  Diet effective now                 DVT prophylaxis: SCDs  Start: 12/03/20 0332   Antimicrobials:  None Fluid: LR at 75 mL/h. Consultants: GI, GU cardiac transplant team on the phone Family Communication:  Discussed with patient's grandmother on the phone.  Status is: Inpatient  Remains inpatient appropriate because: Continue to have significant nausea, vomiting, unable to tolerate oral intake.  Dispo:  Patient From: Home  Planned Disposition: Home  Expected discharge date: 12/06/2020  Medically stable for discharge: No  Infusions:  . sodium chloride    . lactated ringers 75 mL/hr at 12/06/20 0616    Scheduled Meds: . amLODipine  5 mg Oral Daily  . doxazosin  1 mg Oral QHS  . feeding supplement  237 mL Oral TID BM  . insulin aspart  0-15 Units Subcutaneous TID WC  . insulin aspart  0-5 Units Subcutaneous QHS  . insulin aspart  2 Units Subcutaneous TID WC  . insulin detemir  12 Units Subcutaneous Daily  . lacosamide  50 mg Oral BID  . methylPREDNISolone (SOLU-MEDROL) injection  6 mg Intravenous Daily  . metoCLOPramide (REGLAN) injection  10 mg Intravenous TID AC  . multivitamin with minerals  1 tablet Oral Daily  . mycophenolate  720 mg Oral BID  . pantoprazole  40 mg Intravenous Q12H  . pravastatin  40 mg Oral Daily  . sulfamethoxazole-trimethoprim  1 tablet Oral Daily  . Tacrolimus ER  6 mg Oral QAC breakfast    Antimicrobials: Anti-infectives (From admission, onward)   Start     Dose/Rate Route Frequency Ordered Stop   12/04/20 1015  sulfamethoxazole-trimethoprim (BACTRIM) 400-80 MG per tablet 1 tablet        1 tablet Oral Daily 12/04/20 0925     12/04/20 0200  vancomycin (VANCOREADY) IVPB 750 mg/150 mL  Status:  Discontinued        750 mg 150 mL/hr over 60 Minutes Intravenous Every 24 hours 12/03/20 0125 12/04/20 1251   12/04/20 0130  ceFEPIme (MAXIPIME) 2 g in sodium chloride 0.9 % 100 mL IVPB  Status:  Discontinued        2 g 200 mL/hr over 30 Minutes Intravenous Every 24 hours 12/03/20 0125 12/04/20 1251   12/03/20  0130  ceFEPIme (MAXIPIME) 2 g in sodium chloride 0.9 % 100 mL IVPB        2 g 200 mL/hr over 30 Minutes Intravenous  Once 12/03/20 0122 12/03/20 0300   12/03/20 0130  vancomycin (VANCOCIN) IVPB 1000 mg/200 mL premix        1,000 mg 200 mL/hr over 60 Minutes Intravenous  Once 12/03/20 0122 12/03/20 0330      PRN meds: sodium chloride, acetaminophen **OR** acetaminophen, bisacodyl, HYDROcodone-acetaminophen, melatonin, metoCLOPramide (REGLAN) injection, morphine injection   Objective: Vitals:   12/06/20 0500 12/06/20 0844  BP: (!) 154/93 137/87  Pulse: 100 93  Resp: 18   Temp: 99 F (37.2 C)   SpO2: 100%     Intake/Output Summary (Last 24 hours) at 12/06/2020 0919 Last data filed at 12/06/2020 0506 Gross per 24 hour  Intake 240 ml  Output 1300 ml  Net -1060 ml   Filed Weights   12/03/20 0113 12/03/20 2142  Weight: 52.2 kg 47.4 kg   Weight change:  Body mass index is 16.37 kg/m.   Physical Exam: General exam: Not in physical distress at rest.   Skin: No rashes, lesions or ulcers. HEENT: Atraumatic, normocephalic, no obvious bleeding Lungs: Clear to auscultation bilaterally CVS: Midsternal scar. Tachycardic at baseline post cardiac transplant status GI/Abd soft, improving tenderness in the epigastrium, nondistended, bowel sound present CNS: Alert, awake, oriented x3. Psychiatry: Depressed look Extremities: No pedal edema, no calf tenderness  Data Review: I have personally reviewed the laboratory data and studies available.  Recent Labs  Lab 12/03/20 0448 12/03/20 1005 12/04/20 0153 12/05/20 0113 12/06/20 0405  WBC 9.7 9.2 5.5 4.0 3.6*  NEUTROABS 7.4  --   --   --   --   HGB 13.3 12.1* 11.5* 11.1* 10.5*  HCT 39.9 36.7* 33.8* 33.9* 31.7*  MCV 74.6* 76.1* 75.6* 75.7* 75.1*  PLT 364 341 293 251 211   Recent Labs  Lab 12/03/20 0130 12/03/20 0448 12/03/20 1005 12/04/20 0153 12/05/20 0113 12/06/20 0405  NA 134* 136 135 135 137 133*  K 4.1 3.7 4.3 3.4* 3.4*  3.8  CL 88* 86* 93* 97* 101 100  CO2 28 30 29 25 25 25   GLUCOSE 442* 288* 213* 131* 80 180*  BUN 23* 24* 22*  17 12 8   CREATININE 1.97* 2.20* 1.77* 1.23 1.18 1.09  CALCIUM 8.3* 9.3 8.8* 8.7* 8.9 8.6*  MG 1.7  --   --   --   --   --   PHOS 5.2*  --   --   --   --   --     F/u labs ordered  Signed, Lorin GlassBinaya Zyier Dykema, MD [facility] 12/06/2020

## 2020-12-07 DIAGNOSIS — R112 Nausea with vomiting, unspecified: Secondary | ICD-10-CM | POA: Diagnosis present

## 2020-12-07 LAB — CBC WITH DIFFERENTIAL/PLATELET
Abs Immature Granulocytes: 0.03 10*3/uL (ref 0.00–0.07)
Basophils Absolute: 0.1 10*3/uL (ref 0.0–0.1)
Basophils Relative: 1 %
Eosinophils Absolute: 0.2 10*3/uL (ref 0.0–0.5)
Eosinophils Relative: 7 %
HCT: 28.4 % — ABNORMAL LOW (ref 39.0–52.0)
Hemoglobin: 10 g/dL — ABNORMAL LOW (ref 13.0–17.0)
Immature Granulocytes: 1 %
Lymphocytes Relative: 34 %
Lymphs Abs: 1.2 10*3/uL (ref 0.7–4.0)
MCH: 26 pg (ref 26.0–34.0)
MCHC: 35.2 g/dL (ref 30.0–36.0)
MCV: 73.8 fL — ABNORMAL LOW (ref 80.0–100.0)
Monocytes Absolute: 0.6 10*3/uL (ref 0.1–1.0)
Monocytes Relative: 17 %
Neutro Abs: 1.5 10*3/uL — ABNORMAL LOW (ref 1.7–7.7)
Neutrophils Relative %: 40 %
Platelets: 205 10*3/uL (ref 150–400)
RBC: 3.85 MIL/uL — ABNORMAL LOW (ref 4.22–5.81)
RDW: 14.3 % (ref 11.5–15.5)
WBC: 3.6 10*3/uL — ABNORMAL LOW (ref 4.0–10.5)
nRBC: 0 % (ref 0.0–0.2)

## 2020-12-07 LAB — GLUCOSE, CAPILLARY
Glucose-Capillary: 210 mg/dL — ABNORMAL HIGH (ref 70–99)
Glucose-Capillary: 234 mg/dL — ABNORMAL HIGH (ref 70–99)
Glucose-Capillary: 234 mg/dL — ABNORMAL HIGH (ref 70–99)

## 2020-12-07 LAB — CULTURE, BLOOD (ROUTINE X 2)
Culture: NO GROWTH
Special Requests: ADEQUATE

## 2020-12-07 LAB — BASIC METABOLIC PANEL
Anion gap: 10 (ref 5–15)
BUN: 5 mg/dL — ABNORMAL LOW (ref 6–20)
CO2: 22 mmol/L (ref 22–32)
Calcium: 8.3 mg/dL — ABNORMAL LOW (ref 8.9–10.3)
Chloride: 100 mmol/L (ref 98–111)
Creatinine, Ser: 0.97 mg/dL (ref 0.61–1.24)
GFR, Estimated: >60 mL/min (ref >60)
Glucose, Bld: 189 mg/dL — ABNORMAL HIGH (ref 70–99)
Potassium: 3.6 mmol/L (ref 3.5–5.1)
Sodium: 132 mmol/L — ABNORMAL LOW (ref 135–145)

## 2020-12-07 LAB — PHOSPHORUS: Phosphorus: 1.4 mg/dL — ABNORMAL LOW (ref 2.5–4.6)

## 2020-12-07 LAB — MAGNESIUM: Magnesium: 1.1 mg/dL — ABNORMAL LOW (ref 1.7–2.4)

## 2020-12-07 MED ORDER — POTASSIUM PHOSPHATES 15 MMOLE/5ML IV SOLN
30.0000 mmol | Freq: Once | INTRAVENOUS | Status: AC
  Filled 2020-12-07: qty 10

## 2020-12-07 MED ORDER — METOCLOPRAMIDE HCL 5 MG PO TABS: 5.0000 mg | ORAL_TABLET | Freq: Four times a day (QID) | ORAL | 0 refills | Status: DC | PRN

## 2020-12-07 MED ORDER — K PHOS MONO-SOD PHOS DI & MONO 155-852-130 MG PO TABS
500.0000 mg | ORAL_TABLET | Freq: Once | ORAL | Status: AC
  Administered 2020-12-07: 500 mg via ORAL
  Filled 2020-12-07: qty 2

## 2020-12-07 MED ORDER — MAGNESIUM SULFATE 4 GM/100ML IV SOLN
4.0000 g | Freq: Once | INTRAVENOUS | Status: AC
  Administered 2020-12-07: 4 g via INTRAVENOUS
  Filled 2020-12-07: qty 100

## 2020-12-07 MED ORDER — POTASSIUM PHOSPHATES 15 MMOLE/5ML IV SOLN
30.0000 mmol | Freq: Four times a day (QID) | INTRAVENOUS | Status: DC
  Administered 2020-12-07: 30 mmol via INTRAVENOUS
  Filled 2020-12-07 (×2): qty 10

## 2020-12-07 NOTE — Progress Notes (Signed)
Occupational Therapy Treatment Patient Details Name: Manuel Baker MRN: [id number] DOB: December 05, 1995 Today's Date: 12/07/2020    History of present illness 25 y.o. 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  Patient presented to the ED on 1/8 with complaint of nausea, vomiting, hematemesis.   OT comments  OT treatment session with focus on self-care re-education, ADL tranfers, and functional mobility. Patient has met 5/5 goals and currently demonstrates I with all ADLs, ADL tranfers and functional mobility grossly. Patient completed 3/3 parts of toilet task, 2/3 grooming tasks standing at sink level, and functional mobility of 300+ft with I and does not require continued acute OT services with OT to sign off at this time. Patient in agreement.    Follow Up Recommendations  No OT follow up    Equipment Recommendations  None recommended by OT    Recommendations for Other Services      Precautions / Restrictions Precautions Precautions: Other (comment) Precaution Comments: watch BP Restrictions Weight Bearing Restrictions: No       Mobility Bed Mobility               General bed mobility comments: Patient seated in recliner upon entry.  Transfers Overall transfer level: Needs assistance Equipment used: None Transfers: Sit to/from Stand Sit to Stand: Independent         General transfer comment: Sit to stand from various surfaces in room with Mod I. No AD.    Balance Overall balance assessment: No apparent balance deficits (not formally assessed)                                         ADL either performed or assessed with clinical judgement   ADL                           Toilet Transfer:  Independent Toilet Transfer Details (indicate cue type and reason): Able to manage IV and complete 3/3 parts of toileting task with I. Toileting- Clothing Manipulation and Hygiene: Independent       Functional mobility during ADLs: Independent General ADL Comments: Patient able to ambulate in room and 300+ feet in hallway independently.     Vision       Perception     Praxis      Cognition Arousal/Alertness: Awake/alert Behavior During Therapy: WFL for tasks assessed/performed Overall Cognitive Status: Within Functional Limits for tasks assessed                                          Exercises     Shoulder Instructions       General Comments VSS    Pertinent Vitals/ Pain       Pain Assessment: No/denies pain  Home Living                                          Prior Functioning/Environment  Frequency           Progress Toward Goals  OT Goals(current goals can now be found in the care plan section)  Progress towards OT goals: Goals met/education completed, patient discharged from OT  Acute Rehab OT Goals Patient Stated Goal: To return home. ADL Goals Pt Will Perform Grooming: with modified independence;standing Pt Will Perform Upper Body Dressing: Independently;sitting Pt Will Perform Lower Body Dressing: Independently;sit to/from stand Pt Will Transfer to Toilet: Independently Pt Will Perform Toileting - Clothing Manipulation and hygiene: Independently  Plan All goals met and education completed, patient discharged from OT services    Co-evaluation                 AM-PAC OT "6 Clicks" Daily Activity     Outcome Measure   Help from another person eating meals?: None Help from another person taking care of personal grooming?: None Help from another person toileting, which includes using toliet, bedpan, or urinal?: None Help from another person bathing (including washing, rinsing,  drying)?: None Help from another person to put on and taking off regular upper body clothing?: None Help from another person to put on and taking off regular lower body clothing?: None 6 Click Score: 24    End of Session        Activity Tolerance Patient tolerated treatment well   Patient Left in chair;with call bell/phone within reach   Nurse Communication          Time: 8206-0156 OT Time Calculation (min): 17 min  Charges: OT General Charges $OT Visit: 1 Visit OT Treatments $Self Care/Home Management : 8-22 mins  Clemens Lachman H. OTR/L Supplemental OT, Department of rehab services [phone]   Maleiah Dula R H. 12/07/2020, 2:22 PM

## 2020-12-07 NOTE — Progress Notes (Signed)
Physician Discharge Summary  Manuel Baker:[id number] DOB: Jun 23, 1996 DOA: 12/02/2020  PCP: Dem[facility]  Admit date: 12/02/2020 Discharge date: 12/07/2020  Admitted From: Home Discharge disposition: Home   Code Status: Full Code  Diet Recommendation: Soft diet.  Advance gradually to diabetic diet Discharge Diagnosis:   Principal Problem:   Intractable nausea and vomiting Active Problems:   Uncontrolled type 1 diabetes mellitus with hyperglycemia (HCC)   Seizure (HCC)   Hyperglycemia   History of stroke   Esophagitis   Elevated lactic acid level   Diabetic gastroparesis (HCC)   Dehydration   Protein-calorie malnutrition, severe  Chief Complaint  Patient presents with  . Hematemesis   Brief narrative: Manuel Baker is a 25 y.o. 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 Patient presented to the ED on 1/8 with complaint of nausea, vomiting, hematemesis. He apparently ran out of Reglan the day before he started having nausea and vomiting.  Over an hour duration, he had 5-6 forceful episodes of vomiting followed by a small amount of bright red blood.  Since being in the ED, patient has not had any vomiting.     In the ED, he was tachycardic to 145, hypertensive with systolics in 178, CBG 584, hemoglobin 14.6--> 13.3 overnight. He was noted to be hypoglycemic, creatinine elevated to 2.51. CT abdomen pelvis did not show anything acute. Labs initially showed potassium low at 2.9 which was replaced. Patient was admitted to hospital service.  GI consultation was obtained.  Subjective: Patient was seen and examined this morning. Feels much better this morning.  Up in the chair.  Eating.  Says that he has been tolerating food  since last night.  Wants to go home today.  Assessment/Plan: Intractable nausea and vomiting History of gastroparesis, GERD, esophagitis -Apparently patient ran out of his Reglan and he started having nausea and vomiting.   -For the last for 5 days since admission, patient was having minimal appetite, consistent pain, nausea and required repeated doses of IV Reglan.  He says he has been feeling much better since last night and able to eat.  Wants to be discharged today.  Oral Reglan as needed prescription given. -Encourage soft diet for next 2 to 3 days at home before advancing to regular consistency diet.  Mild hematemesis -At home, after 5-6 forceful episodes in an hour, he had flecks of blood. -GI consultation was obtained. Patient probably had some mucosal tear related to forceful vomiting.  Hemoglobin is stable. -Currently on Protonix 40 mg IV twice daily as well as Reglan IV scheduled.  No need of inpatient intervention. GI signed off.  Acute metabolic encephalopathy -Patient was lethargic at presentation.  Mental status improved with hydration. -CT scan of head was obtained on 1/9 which did not show any acute process.  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 fib arrest and acute CVA. -Also had ASD repair done at the age of 52. -History of Cheryle Horsfall Sacks endocarditis  -He is chronically immunosuppressed with CellCept, tacrolimus and prednisone.  He is also on Bactrim for prophylaxis. Last episode of acute rejection 02/18/2020 - 03/16/2020  -Tacrolimus level sent on 1/9 resulted in range at 6.3 (therapeutic range 2-20) -1/10 echo with EF 60  to 65%. -follows at [facility] team has been calling to check on him. I donot see any need of transfer to [facility] at the time.  Type 1 diabetes mellitus Hyperglycemia -A1c 9.7 on 1/9 -Initially required insulin drip. Currently switched to subcutaneous  insulin. -Home meds include Tresiba 15 units at bedtime and sliding scale insulin with Accu-Cheks. -Continue the same at home.  Encouraged to improve compliance to target A1c less than 7. Recent Labs  Lab 12/06/20 1604 12/06/20 2056 12/06/20 2132 12/07/20 0612 12/07/20 1101  GLUCAP 198* 57* 135* 210* 234*   SIRS -Patient had a temperature of 100.2 in the ED.  No episode of fever after that.  He was tachycardic and had elevated creatinine and lactic acid level likely due to dehydration.  I do not see a source of infection. Sepsis ruled out.  No growth in blood culture in 2 days.  Broad-spectrum antibiotics have been stopped. Recent Labs  Lab 12/02/20 2212 12/03/20 0130 12/03/20 0300 12/03/20 0448 12/03/20 1005 12/03/20 1218 12/04/20 0153 12/05/20 0113 12/06/20 0405 12/07/20 0327  WBC  --   --   --    < > 9.2  --  5.5 4.0 3.6* 3.6*  LATICACIDVEN 3.9* 1.9 4.3*  --   --  1.3  --   --   --   --   PROCALCITON  --  0.95  --   --   --   --   --   --   --   --    < > = values in this interval not displayed.   Acute kidney injury -Due to dehydration.  Creatinine normal at baseline, presented with 2.51.  Creatinine improved with IV fluid. Recent Labs    10/13/20 1716 10/13/20 1911 12/02/20 2015 12/03/20 0130 12/03/20 0448 12/03/20 1005 12/04/20 0153 12/05/20 0113 12/06/20 0405 12/07/20 0327  BUN 9 11 26* 23* 24* 22* 17 12 8  5*  CREATININE 0.85 1.00 2.51* 1.97* 2.20* 1.77* 1.23 1.18 1.09 0.97   Elevated lactic acid level  -likely due to dehydration -Improved with hydration. Recent Labs  Lab 12/02/20 2212 12/03/20 0130 12/03/20 0300 12/03/20 1218  LATICACIDVEN 3.9* 1.9 4.3* 1.3   Hypokalemia/hypomagnesemia/hypophosphatemia -Potassium level improving with replacement.  Magnesium level and phosphorus level were low today.  Aggressive IV and oral replacement given.  Since his overall appetite has improved now, I expect his electrolytes to spontaneously improve. Recent  Labs  Lab 12/03/20 0130 12/03/20 0448 12/03/20 1005 12/04/20 0153 12/05/20 0113 12/06/20 0405 12/07/20 0327  K 4.1   < > 4.3 3.4* 3.4* 3.8 3.6  MG 1.7  --   --   --   --   --  1.1*  PHOS 5.2*  --   --   --   --   --  1.4*   < > = values in this interval not displayed.   Essential hypertension -Home meds include Amlodipine 5 mg daily, Cardura 1 mg at bedtime -Continue both.  History of CVA -with/out residual deficits.  Continue aspirin 81 mg daily and pravastatin 40 mg daily.   hx of seizure - continue Vimpat   Wound care:    Discharge Exam:   Vitals:   12/06/20 2326 12/07/20 0434 12/07/20 0813 12/07/20 1146  BP: (!) 149/79 133/82 128/72 124/68  Pulse: (!) 102 (!) 103 92 89  Resp: 17 18 18 17   Temp: 100.3 F (37.9 C) 99.5 F (37.5 C) 99.7 F (37.6 C) 98.6 F (37 C)  TempSrc: Oral Oral Oral Oral  SpO2: 100% 100% 100% 100%  Weight:      Height:        Body mass index is 16.37 kg/m.  General exam: Pleasant, young African-American male.  Not in distress Skin: No rashes, lesions or ulcers. HEENT: Atraumatic, normocephalic, no obvious bleeding Lungs: Clear to auscultation bilaterally CVS: Regular rate and rhythm, no murmur GI/Abd soft, nontender, nondistended, bowel sound present CNS: Alert, awake, oriented x3 Psychiatry: Mood appropriate Extremities: No pedal edema, no calf tenderness  Follow ups:   Discharge Instructions    Diet Carb Modified   Complete by: As directed    Increase activity slowly   Complete by: As directed       Follow-up Information    Nahser, Deloris PingPhilip J, MD .   Specialty: Cardiology Contact information: [address] [place] [address] [phone]        [facility].   Contact information: [address] [place] [phone]               Recommendations for Outpatient Follow-Up:   1. Follow-up with PCP as an outpatient  Discharge Instructions:   [facility]   Get CBC/BMP checked in next visit within 1 week by PCP or SNF MD ( we routinely change or add medications that can affect your baseline labs and fluid status, therefore we recommend that you get the mentioned basic workup next visit with your PCP, your PCP may decide not to get them or add new tests based on their clinical decision)  On your next visit with your PCP, please Get Medicines reviewed and adjusted.  Please request your PCP  to go over all Hospital Tests and Procedure/Radiological results at the follow up, please get all Hospital records sent to your Prim MD by signing hospital release before you go home.  Activity: As tolerated with Full fall precautions use walker/cane & assistance as needed  For Heart failure patients - Check your Weight same time everyday, if you gain over 2 pounds, or you develop in leg swelling, experience more shortness of breath or chest pain, call your Primary MD immediately. Follow Cardiac Low Salt Diet and 1.5 lit/day fluid restriction.  If you have smoked or chewed Tobacco in the last 2 yrs please stop smoking, stop any regular Alcohol  and or any Recreational drug use.  If you experience worsening of your admission symptoms, develop shortness of breath, life threatening emergency, suicidal or homicidal thoughts you must seek medical attention immediately by calling 911 or calling your MD immediately  if symptoms less severe.  You Must read complete instructions/literature along with all the possible adverse reactions/side effects for all the Medicines you take and that have been prescribed to you. Take any new Medicines after you have completely understood and accpet all the possible adverse reactions/side effects.   Do not drive, operate heavy machinery, perform activities at heights, swimming or participation in water activities or provide baby sitting services if your were admitted  for syncope or siezures until you have seen by Primary MD or a Neurologist and advised to do so again.  Do not drive when taking Pain medications.  Do not take more than prescribed Pain, Sleep and Anxiety Medications  Wear Seat belts while driving.   Please note You were cared for by a hospitalist during your hospital stay. If you have any  questions about your discharge medications or the care you received while you were in the hospital after you are discharged, you can call the unit and asked to speak with the hospitalist on call if the hospitalist that took care of you is not available. Once you are discharged, your primary care physician will handle any further medical issues. Please note that NO REFILLS for any discharge medications will be authorized once you are discharged, as it is imperative that you return to your primary care physician (or establish a relationship with a primary care physician if you do not have one) for your aftercare needs so that they can reassess your need for medications and monitor your lab values.    Allergies as of 12/07/2020      Reactions   Ibuprofen    Told not to take ibuprofen after cardiac surgery   Nsaids Other (See Comments)   Unknown reaction      Medication List    TAKE these medications   acetaminophen 500 MG tablet Commonly known as: TYLENOL Take 500 mg by mouth every 6 (six) hours as needed for mild pain.   amLODipine 5 MG tablet Commonly known as: NORVASC Take 5 mg by mouth daily.   Apidra SoloStar 100 UNIT/ML Solostar Pen Generic drug: insulin glulisine Inject 0-45 Units into the skin See admin instructions. total daily dose up to 45 units  with meals and snacks   Aspirin Low Dose 81 MG EC tablet Generic drug: aspirin Take 81 mg by mouth daily.   Bactrim 400-80 MG tablet Generic drug: sulfamethoxazole-trimethoprim Take 1 tablet by mouth daily.   Baqsimi One Pack 3 MG/DOSE Powd Generic drug: Glucagon Place 1 spray into the  nose as needed (low blood sugar).   doxazosin 2 MG tablet Commonly known as: CARDURA Take 1 mg by mouth at bedtime.   INSULIN SYRINGE .3CC/31GX5/16" 31G X 5/16" 0.3 ML Misc 1 Act by Does not apply route 4 (four) times daily -  before meals and at bedtime.   metoCLOPramide 5 MG tablet Commonly known as: REGLAN Take 1 tablet (5 mg total) by mouth every 6 (six) hours as needed for up to 7 days for nausea. What changed:   when to take this  reasons to take this   mycophenolate 360 MG Tbec EC tablet Commonly known as: MYFORTIC Take 720 mg by mouth 2 (two) times daily.   pantoprazole 40 MG tablet Commonly known as: PROTONIX 40 mg daily.   polyethylene glycol powder 17 GM/SCOOP powder Commonly known as: GLYCOLAX/MIRALAX Take 17 g by mouth daily as needed for constipation.   pravastatin 40 MG tablet Commonly known as: PRAVACHOL Take 40 mg by mouth daily.   predniSONE 5 MG tablet Commonly known as: DELTASONE Take 5 mg by mouth daily with breakfast.   sertraline 50 MG tablet Commonly known as: ZOLOFT Take 50 mg by mouth daily.   sildenafil 25 MG tablet Commonly known as: VIAGRA Take by mouth.   Tacrolimus ER 1 MG Tb24 Take 6 mg by mouth daily before breakfast.   Evaristo Buryresiba FlexTouch 100 UNIT/ML FlexTouch Pen Generic drug: insulin degludec Inject 14 Units into the skin at bedtime.   Vimpat 50 MG Tabs tablet Generic drug: lacosamide Take 50 mg by mouth 2 (two) times daily.       Time coordinating discharge: 35 minutes  The results of significant diagnostics from this hospitalization (including imaging, microbiology, ancillary and laboratory) are listed below for reference.    Procedures and Diagnostic Studies:  CT HEAD WO CONTRAST  Result Date: 12/03/2020 CLINICAL DATA:  Acute encephalopathy.  Altered mental status. EXAM: CT HEAD WITHOUT CONTRAST TECHNIQUE: Contiguous axial images were obtained from the base of the skull through the vertex without intravenous  contrast. COMPARISON:  06/25/2020 and MRI on 02/29/2019 FINDINGS: Brain: No evidence of acute infarction, hemorrhage, hydrocephalus, or extra-axial collection. Old infarcts are again seen in the right posteroinferior cerebellum. A 2.2 cm cyst in the left parieto-occipital region adjacent to the atrium of the left lateral ventricle is stable, consistent with a neural glial cyst. Vascular:  No hyperdense vessel or other acute findings. Skull: No evidence of fracture or other significant bone abnormality. Sinuses/Orbits:  No acute findings. Other: None. IMPRESSION: No acute intracranial abnormality. Stable old right cerebellar infarcts. Stable left parieto-occipital neural glial cyst. Electronically Signed   By: Danae Orleans M.D.   On: 12/03/2020 13:09   DG Chest Portable 1 View  Result Date: 12/02/2020 CLINICAL DATA:  Vomiting.  History of heart transplant. EXAM: PORTABLE CHEST 1 VIEW COMPARISON:  October 13, 2020 FINDINGS: The heart size and mediastinal contours are within normal limits. Both lungs are clear. The visualized skeletal structures are unremarkable. The patient is status post prior median sternotomy. IMPRESSION: No active disease. Electronically Signed   By: Katherine Mantle M.D.   On: 12/02/2020 22:03   CT CHEST ABDOMEN PELVIS WO CONTRAST  Result Date: 12/02/2020 CLINICAL DATA:  Cardiac transplant in March, recently ran out of gastroparesis medication, chest pain following emesis, concern for esophageal injury EXAM: CT CHEST, ABDOMEN AND PELVIS WITHOUT CONTRAST TECHNIQUE: Multidetector CT imaging of the chest, abdomen and pelvis was performed following the standard protocol without IV contrast. COMPARISON:  Radiograph 12/02/2020, MR thoracic spine 08/28/2018, CT thoracic spine 08/27/2018 FINDINGS: CT CHEST FINDINGS Cardiovascular: Cardiovascular evaluation limited in the absence of contrast media. There are postsurgical changes from prior sternotomy and orthotopic cardiac transplantation. No  gross complications are evident within the limitations of this unenhanced CT. Normal cardiac size. No pericardial effusion. Postsurgical changes along the normal caliber aorta. Incidental note made of the left vertebral artery arising directly from the aortic arch between the origins of the left common carotid and subclavian arteries. Aorta is otherwise unremarkable without periaortic stranding fluid. No major venous abnormalities are seen. Central pulmonary arteries are normal caliber. Luminal evaluation precluded in the absence of contrast media. Mediastinum/Nodes: There is marked circumferential thickening of the distal 2/3 of the thoracic esophagus. Minimal fluid within the esophageal lumen. No extraluminal gas or free fluid is evident however. No acute abnormality of the trachea. Thyroid gland and thoracic inlet are unremarkable. Postsurgical changes anterior mediastinum related to prior sternotomy. No worrisome mediastinal or axillary adenopathy. Hilar nodal evaluation is limited in the absence of intravenous contrast media. Lungs/Pleura: No consolidation, features of edema, pneumothorax, or effusion. No suspicious pulmonary nodules or masses. Musculoskeletal: Postsurgical changes from prior sternotomy with reinforced sternal sutures which appear intact and aligned as well as remote appearing deformities of several anterior ribs. Anterior wedging compression deformity T12 with up to 20% height loss anteriorly is new from prior. More remote marked vertebral body height loss at T4 with unchanged from prior. Additional milder biconvex deformities T2, T3, T7 and superior endplate deformities at T5 and T6, not significantly changed from comparison imaging. CT ABDOMEN PELVIS FINDINGS Hepatobiliary: No visible focal liver lesion with limitations of this unenhanced CT. Smooth liver surface contour. Normal liver attenuation. Normal gallbladder and biliary tree. Pancreas: No pancreatic ductal dilatation or surrounding  inflammatory changes. Spleen: Normal in size. No concerning splenic lesions. Adrenals/Urinary Tract: Normal adrenal glands. Kidneys are normally located with symmetric enhancement and excretion. No suspicious renal lesion, urolithiasis or hydronephrosis. Urinary bladder is unremarkable for the degree of distention. Stomach/Bowel: Distal esophageal thickening, as above. Fluid-filled appearance of the stomach. Duodenum is unremarkable. No small bowel thickening or dilatation. Air-filled appendix in the right lower quadrant. No colonic dilatation or wall thickening. Vascular/Lymphatic: Limited assessment given absence of contrast media. No significant vascular findings are present. No enlarged abdominal or pelvic lymph nodes. Reproductive: The prostate and seminal vesicles are unremarkable. Other: No abdominopelvic free fluid or free gas. No bowel containing hernias. Musculoskeletal: Mild levocurvature of the lumbar spine, apex L3. No acute osseous abnormality or suspicious osseous lesion. IMPRESSION: 1. Marked circumferential thickening of the distal 2/3 of the thoracic esophagus, suggestive of esophagitis. No extraluminal gas or free fluid to suggest frank perforation. Consider further evaluation with endoscopy as clinically indicated. 2. Postsurgical changes from prior sternotomy and orthotopic cardiac transplantation. No gross complications are evident within the limitations of this unenhanced CT. 3. Anterior wedging compression deformity T12 with up to 20% height loss anteriorly, new from prior. Additional remote compression deformities T2-T7, not significantly changed from comparison imaging. Electronically Signed   By: Kreg Shropshire M.D.   On: 12/02/2020 22:35     Labs:   Basic Metabolic Panel: Recent Labs  Lab 12/03/20 0130 12/03/20 0448 12/03/20 1005 12/04/20 0153 12/05/20 0113 12/06/20 0405 12/07/20 0327  NA 134*   < > 135 135 137 133* 132*  K 4.1   < > 4.3 3.4* 3.4* 3.8 3.6  CL 88*   < > 93*  97* 101 100 100  CO2 28   < > 29 25 25 25 22   GLUCOSE 442*   < > 213* 131* 80 180* 189*  BUN 23*   < > 22* 17 12 8  5*  CREATININE 1.97*   < > 1.77* 1.23 1.18 1.09 0.97  CALCIUM 8.3*   < > 8.8* 8.7* 8.9 8.6* 8.3*  MG 1.7  --   --   --   --   --  1.1*  PHOS 5.2*  --   --   --   --   --  1.4*   < > = values in this interval not displayed.   GFR Estimated Creatinine Clearance: 79.4 mL/min (by C-G formula based on SCr of 0.97 mg/dL). Liver Function Tests: Recent Labs  Lab 12/02/20 2015 12/03/20 0448  AST 31 29  ALT 26 23  ALKPHOS 97 94  BILITOT 1.0 0.5  PROT 8.0 7.8  ALBUMIN 4.5 4.2   Recent Labs  Lab 12/02/20 2015  LIPASE 19   No results for input(s): AMMONIA in the last 168 hours. Coagulation profile Recent Labs  Lab 12/02/20 2134  INR 1.1    CBC: Recent Labs  Lab 12/03/20 0448 12/03/20 1005 12/04/20 0153 12/05/20 0113 12/06/20 0405 12/07/20 0327  WBC 9.7 9.2 5.5 4.0 3.6* 3.6*  NEUTROABS 7.4  --   --   --   --  1.5*  HGB 13.3 12.1* 11.5* 11.1* 10.5* 10.0*  HCT 39.9 36.7* 33.8* 33.9* 31.7* 28.4*  MCV 74.6* 76.1* 75.6* 75.7* 75.1* 73.8*  PLT 364 341 293 251 211 205   Cardiac Enzymes: Recent Labs  Lab 12/03/20 0130  CKTOTAL 115   BNP: Invalid input(s): POCBNP CBG: Recent Labs  Lab 12/06/20 1604 12/06/20 2056 12/06/20 2132 12/07/20 0612 12/07/20 1101  GLUCAP  198* 57* 135* 210* 234*   D-Dimer No results for input(s): DDIMER in the last 72 hours. Hgb A1c No results for input(s): HGBA1C in the last 72 hours. Lipid Profile No results for input(s): CHOL, HDL, LDLCALC, TRIG, CHOLHDL, LDLDIRECT in the last 72 hours. Thyroid function studies No results for input(s): TSH, T4TOTAL, T3FREE, THYROIDAB in the last 72 hours.  Invalid input(s): FREET3 Anemia work up No results for input(s): VITAMINB12, FOLATE, FERRITIN, TIBC, IRON, RETICCTPCT in the last 72 hours. Microbiology Recent Results (from the past 240 hour(s))  Resp Panel by RT-PCR (Flu A&B,  Covid) Nasopharyngeal Swab     Status: None   Collection Time: 12/02/20  8:28 PM   Specimen: Nasopharyngeal Swab; Nasopharyngeal(NP) swabs in vial transport medium  Result Value Ref Range Status   SARS Coronavirus 2 by RT PCR NEGATIVE NEGATIVE Final    Comment: (NOTE) SARS-CoV-2 target nucleic acids are NOT DETECTED.  The SARS-CoV-2 RNA is generally detectable in upper respiratory specimens during the acute phase of infection. The lowest concentration of SARS-CoV-2 viral copies this assay can detect is 138 copies/mL. A negative result does not preclude SARS-Cov-2 infection and should not be used as the sole basis for treatment or other patient management decisions. A negative result may occur with  improper specimen collection/handling, submission of specimen other than nasopharyngeal swab, presence of viral mutation(s) within the areas targeted by this assay, and inadequate number of viral copies(<138 copies/mL). A negative result must be combined with clinical observations, patient history, and epidemiological information. The expected result is Negative.  Fact Sheet for Patients:  BloggerCourse.com  Fact Sheet for Healthcare Providers:  SeriousBroker.it  This test is no t yet approved or cleared by the [place] FDA and  has been authorized for detection and/or diagnosis of SARS-CoV-2 by FDA under an Emergency Use Authorization (EUA). This EUA will remain  in effect (meaning this test can be used) for the duration of the COVID-19 declaration under Section 564(b)(1) of the Act, 21 U.S.C.section 360bbb-3(b)(1), unless the authorization is terminated  or revoked sooner.       Influenza A by PCR NEGATIVE NEGATIVE Final   Influenza B by PCR NEGATIVE NEGATIVE Final    Comment: (NOTE) The Xpert Xpress SARS-CoV-2/FLU/RSV plus assay is intended as an aid in the diagnosis of influenza from Nasopharyngeal swab specimens and should  not be used as a sole basis for treatment. Nasal washings and aspirates are unacceptable for Xpert Xpress SARS-CoV-2/FLU/RSV testing.  Fact Sheet for Patients: BloggerCourse.com  Fact Sheet for Healthcare Providers: SeriousBroker.it  This test is not yet approved or cleared by the [place] FDA and has been authorized for detection and/or diagnosis of SARS-CoV-2 by FDA under an Emergency Use Authorization (EUA). This EUA will remain in effect (meaning this test can be used) for the duration of the COVID-19 declaration under Section 564(b)(1) of the Act, 21 U.S.C. section 360bbb-3(b)(1), unless the authorization is terminated or revoked.  Performed at [facility], [address] [place], [place] [address]   Blood culture (routine x 2)     Status: None   Collection Time: 12/02/20 10:40 PM   Specimen: BLOOD  Result Value Ref Range Status   Specimen Description BLOOD RIGHT ANTECUBITAL  Final   Special Requests   Final    BOTTLES DRAWN AEROBIC AND ANAEROBIC Blood Culture adequate volume   Culture   Final    NO GROWTH 5 DAYS Performed at [facility], [address],  [place], [place] [address]    Report Status 12/07/2020 FINAL  Final  Urine culture     Status: Abnormal   Collection Time: 12/02/20 10:41 PM   Specimen: In/Out Cath Urine  Result Value Ref Range Status   Specimen Description IN/OUT CATH URINE  Final   Special Requests NONE  Final   Culture (A)  Final    <10,000 COLONIES/mL INSIGNIFICANT GROWTH Performed at [facility], [address], [place], [place] [address]    Report Status 12/04/2020 FINAL  Final  Respiratory (~20 pathogens) panel by PCR     Status: None   Collection Time: 12/03/20  2:00 AM   Specimen: Nasopharyngeal Swab; Respiratory  Result Value Ref Range Status   Adenovirus NOT DETECTED NOT DETECTED Final   Coronavirus 229E NOT DETECTED NOT DETECTED Final    Comment: (NOTE) The  Coronavirus on the Respiratory Panel, DOES NOT test for the novel  Coronavirus (2019 nCoV)    Coronavirus HKU1 NOT DETECTED NOT DETECTED Final   Coronavirus NL63 NOT DETECTED NOT DETECTED Final   Coronavirus OC43 NOT DETECTED NOT DETECTED Final   Metapneumovirus NOT DETECTED NOT DETECTED Final   Rhinovirus / Enterovirus NOT DETECTED NOT DETECTED Final   Influenza A NOT DETECTED NOT DETECTED Final   Influenza B NOT DETECTED NOT DETECTED Final   Parainfluenza Virus 1 NOT DETECTED NOT DETECTED Final   Parainfluenza Virus 2 NOT DETECTED NOT DETECTED Final   Parainfluenza Virus 3 NOT DETECTED NOT DETECTED Final   Parainfluenza Virus 4 NOT DETECTED NOT DETECTED Final   Respiratory Syncytial Virus NOT DETECTED NOT DETECTED Final   Bordetella pertussis NOT DETECTED NOT DETECTED Final   Bordetella Parapertussis NOT DETECTED NOT DETECTED Final   Chlamydophila pneumoniae NOT DETECTED NOT DETECTED Final   Mycoplasma pneumoniae NOT DETECTED NOT DETECTED Final    Comment: Performed at [facility], [address] [place], [place] [address]     Signed: Lorin Glass  [facility] 12/07/2020, 1:31 PM

## 2020-12-07 NOTE — Progress Notes (Signed)
Suanne Marker to be D/C'd Home per MD order.  Discussed with the patient and all questions fully answered.   VSS, Skin clean, dry and intact without evidence of skin break down, no evidence of skin tears noted. IV catheter discontinued intact. Site without signs and symptoms of complications. Dressing and pressure applied.   An After Visit Summary was printed and given to the patient.    D/C education completed with patient/family including follow up instructions, medication list, d/c activities limitations if indicated, with other d/c instructions as indicated by MD - patient able to verbalize understanding, all questions fully answered.    Patient instructed to return to ED, call 911, or call MD for any changes in condition.    Patient escorted via WC, and D/C home via private car.

## 2020-12-07 NOTE — Progress Notes (Signed)
Pt refusing to eat menu of clear liquid diet. Requesting pudding or something more resembling full liquid diet. Pt not moving in bed or apparently out of bed. Monitoring continues.

## 2020-12-07 NOTE — Progress Notes (Signed)
Inpatient Diabetes Program Recommendations  AACE/ADA: New Consensus Statement on Inpatient Glycemic Control (2015)  Target Ranges:  Prepandial:   less than 140 mg/dL      Peak postprandial:   less than 180 mg/dL (1-2 hours)      Critically ill patients:  140 - 180 mg/dL   Lab Results  Component Value Date   GLUCAP 234 (H) 12/07/2020   HGBA1C 9.7 (H) 12/03/2020    Review of Glycemic Control Results for HERSON, PRICHARD (MRN [id number]) as of 12/07/2020 12:22  Ref. Range 12/06/2020 16:04 12/06/2020 20:56 12/06/2020 21:32 12/07/2020 06:12 12/07/2020 11:01  Glucose-Capillary Latest Ref Range: 70 - 99 mg/dL 884 (H) 57 (L) 166 (H) 210 (H) 234 (H)   Inpatient Diabetes Program Recommendations:    Consider: -Decrease in Novolog correction to 0-9 units tid + hs 0-5 units  Thank you, Darel Hong E. Caprisha Bridgett, RN, MSN, CDE  Diabetes Coordinator Inpatient Glycemic Control Team Team Pager [phone] (8am-5pm) 12/07/2020 12:22 PM

## 2020-12-08 NOTE — Discharge Summary (Signed)
Physician Discharge Summary  Manuel Baker QAS:[id number] DOB: [birth year] DOA: 12/02/2020  PCP: Demaris Callander [facility]  Admit date: 12/02/2020 Discharge date: 12/08/2020  Admitted From: Home Discharge disposition: Home   Code Status: Prior  Diet Recommendation: Soft diet.  Advance gradually to diabetic diet Discharge Diagnosis:   Principal Problem:   Intractable nausea and vomiting Active Problems:   Uncontrolled type 1 diabetes mellitus with hyperglycemia (HCC)   Seizure (HCC)   Hyperglycemia   History of stroke   Esophagitis   Elevated lactic acid level   Diabetic gastroparesis (HCC)   Dehydration   Protein-calorie malnutrition, severe  Chief Complaint  Patient presents with  . Hematemesis   Brief narrative: Manuel Baker is a 25 y.o. 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 Patient presented to the ED on 1/8 with complaint of nausea, vomiting, hematemesis. He apparently ran out of Reglan the day before he started having nausea and vomiting.  Over an hour duration, he had 5-6 forceful episodes of vomiting followed by a small amount of bright red blood.  Since being in the ED, patient has not had any vomiting.     In the ED, he was tachycardic to 145, hypertensive with systolics in 178, CBG 584, hemoglobin 14.6--> 13.3 overnight. He was noted to be hypoglycemic, creatinine elevated to 2.51. CT abdomen pelvis did not show anything acute. Labs initially showed potassium low at 2.9 which was replaced. Patient was admitted to hospital service.  GI consultation was obtained.  Subjective: Patient was seen and examined this morning. Feels much better this morning.  Up in the chair.  Eating.  Says that he has been tolerating food  since last night.  Wants to go home today.  Assessment/Plan: Intractable nausea and vomiting History of gastroparesis, GERD, esophagitis -Apparently patient ran out of his Reglan and he started having nausea and vomiting.   -For the last for 5 days since admission, patient was having minimal appetite, consistent pain, nausea and required repeated doses of IV Reglan.  He says he has been feeling much better since last night and able to eat.  Wants to be discharged today.  Oral Reglan as needed prescription given. -Encourage soft diet for next 2 to 3 days at home before advancing to regular consistency diet.  Mild hematemesis -At home, after 5-6 forceful episodes in an hour, he had flecks of blood. -GI consultation was obtained. Patient probably had some mucosal tear related to forceful vomiting.  Hemoglobin is stable. -Currently on Protonix 40 mg IV twice daily as well as Reglan IV scheduled.  No need of inpatient intervention. GI signed off.  Acute metabolic encephalopathy -Patient was lethargic at presentation.  Mental status improved with hydration. -CT scan of head was obtained on 1/9 which did not show any acute process.  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 fib arrest and acute CVA. -Also had ASD repair done at the age of 69. -History of Cheryle Horsfall Sacks endocarditis  -He is chronically immunosuppressed with CellCept, tacrolimus and prednisone.  He is also on Bactrim for prophylaxis. Last episode of acute rejection 02/18/2020 - 03/16/2020  -Tacrolimus level sent on 1/9 resulted in range at 6.3 (therapeutic range 2-20) -1/10 echo with EF 60 to  65%. -follows at [facility] team has been calling to check on him. I donot see any need of transfer to [facility] at the time.  Type 1 diabetes mellitus Hyperglycemia -A1c 9.7 on 1/9 -Initially required insulin drip. Currently switched to subcutaneous  insulin. -Home meds include Tresiba 15 units at bedtime and sliding scale insulin with Accu-Cheks. -Continue the same at home.  Encouraged to improve compliance to target A1c less than 7. Recent Labs  Lab 12/06/20 2056 12/06/20 2132 12/07/20 0612 12/07/20 1101 12/07/20 1608  GLUCAP 57* 135* 210* 234* 234*   SIRS -Patient had a temperature of 100.2 in the ED.  No episode of fever after that.  He was tachycardic and had elevated creatinine and lactic acid level likely due to dehydration.  I do not see a source of infection. Sepsis ruled out.  No growth in blood culture in 2 days.  Broad-spectrum antibiotics have been stopped. Recent Labs  Lab 12/02/20 2212 12/03/20 0130 12/03/20 0300 12/03/20 0448 12/03/20 1005 12/03/20 1218 12/04/20 0153 12/05/20 0113 12/06/20 0405 12/07/20 0327  WBC  --   --   --    < > 9.2  --  5.5 4.0 3.6* 3.6*  LATICACIDVEN 3.9* 1.9 4.3*  --   --  1.3  --   --   --   --   PROCALCITON  --  0.95  --   --   --   --   --   --   --   --    < > = values in this interval not displayed.   Acute kidney injury -Due to dehydration.  Creatinine normal at baseline, presented with 2.51.  Creatinine improved with IV fluid. Recent Labs    10/13/20 1716 10/13/20 1911 12/02/20 2015 12/03/20 0130 12/03/20 0448 12/03/20 1005 12/04/20 0153 12/05/20 0113 12/06/20 0405 12/07/20 0327  BUN 9 11 26* 23* 24* 22* 17 12 8  5*  CREATININE 0.85 1.00 2.51* 1.97* 2.20* 1.77* 1.23 1.18 1.09 0.97   Elevated lactic acid level  -likely due to dehydration -Improved with hydration. Recent Labs  Lab 12/02/20 2212 12/03/20 0130 12/03/20 0300 12/03/20 1218  LATICACIDVEN 3.9* 1.9 4.3* 1.3   Hypokalemia/hypomagnesemia/hypophosphatemia -Potassium level improving with replacement.  Magnesium level and phosphorus level were low today.  Aggressive IV and oral replacement given.  Since his overall appetite has improved now, I expect his electrolytes to spontaneously improve. Recent  Labs  Lab 12/03/20 0130 12/03/20 0448 12/03/20 1005 12/04/20 0153 12/05/20 0113 12/06/20 0405 12/07/20 0327  K 4.1   < > 4.3 3.4* 3.4* 3.8 3.6  MG 1.7  --   --   --   --   --  1.1*  PHOS 5.2*  --   --   --   --   --  1.4*   < > = values in this interval not displayed.   Essential hypertension -Home meds include Amlodipine 5 mg daily, Cardura 1 mg at bedtime -Continue both.  History of CVA -with/out residual deficits.  Continue aspirin 81 mg daily and pravastatin 40 mg daily.   hx of seizure - continue Vimpat   Wound care:    Discharge Exam:   Vitals:   12/07/20 0434 12/07/20 0813 12/07/20 1146 12/07/20 1654  BP: 133/82 128/72 124/68 118/63  Pulse: (!) 103 92 89 91  Resp: 18 18 17 18   Temp: 99.5 F (37.5 C) 99.7 F (37.6 C) 98.6 F (37 C) 98.5 F (36.9 C)  TempSrc: Oral Oral  Oral Oral  SpO2: 100% 100% 100% 100%  Weight:      Height:        Body mass index is 16.37 kg/m.  General exam: Pleasant, young African-American male.  Not in distress Skin: No rashes, lesions or ulcers. HEENT: Atraumatic, normocephalic, no obvious bleeding Lungs: Clear to auscultation bilaterally CVS: Regular rate and rhythm, no murmur GI/Abd soft, nontender, nondistended, bowel sound present CNS: Alert, awake, oriented x3 Psychiatry: Mood appropriate Extremities: No pedal edema, no calf tenderness  Follow ups:   Discharge Instructions    Diet Carb Modified   Complete by: As directed    Increase activity slowly   Complete by: As directed       Follow-up Information    Nahser, Deloris Ping, MD .   Specialty: Cardiology Contact information: [address] [place] [address] [phone]        [place] [place] [facility] Follow up.   Contact information: [address] [place] [address] [phone]               Recommendations for Outpatient Follow-Up:   1. Follow-up with PCP as an outpatient  Discharge Instructions:  Follow  with Primary MD [facility] in 7 days   Get CBC/BMP checked in next visit within 1 week by PCP or SNF MD ( we routinely change or add medications that can affect your baseline labs and fluid status, therefore we recommend that you get the mentioned basic workup next visit with your PCP, your PCP may decide not to get them or add new tests based on their clinical decision)  On your next visit with your PCP, please Get Medicines reviewed and adjusted.  Please request your PCP  to go over all Hospital Tests and Procedure/Radiological results at the follow up, please get all Hospital records sent to your Prim MD by signing hospital release before you go home.  Activity: As tolerated with Full fall precautions use walker/cane & assistance as needed  For Heart failure patients - Check your Weight same time everyday, if you gain over 2 pounds, or you develop in leg swelling, experience more shortness of breath or chest pain, call your Primary MD immediately. Follow Cardiac Low Salt Diet and 1.5 lit/day fluid restriction.  If you have smoked or chewed Tobacco in the last 2 yrs please stop smoking, stop any regular Alcohol  and or any Recreational drug use.  If you experience worsening of your admission symptoms, develop shortness of breath, life threatening emergency, suicidal or homicidal thoughts you must seek medical attention immediately by calling 911 or calling your MD immediately  if symptoms less severe.  You Must read complete instructions/literature along with all the possible adverse reactions/side effects for all the Medicines you take and that have been prescribed to you. Take any new Medicines after you have completely understood and accpet all the possible adverse reactions/side effects.   Do not drive, operate heavy machinery, perform activities at heights, swimming or participation in water activities or provide baby sitting services if your were admitted for  syncope or siezures until you have seen by Primary MD or a Neurologist and advised to do so again.  Do not drive when taking Pain medications.  Do not take more than prescribed Pain, Sleep and Anxiety Medications  Wear Seat belts while driving.   Please note You were cared for by a hospitalist during your hospital stay. If you have any questions about your  discharge medications or the care you received while you were in the hospital after you are discharged, you can call the unit and asked to speak with the hospitalist on call if the hospitalist that took care of you is not available. Once you are discharged, your primary care physician will handle any further medical issues. Please note that NO REFILLS for any discharge medications will be authorized once you are discharged, as it is imperative that you return to your primary care physician (or establish a relationship with a primary care physician if you do not have one) for your aftercare needs so that they can reassess your need for medications and monitor your lab values.    Allergies as of 12/07/2020      Reactions   Ibuprofen    Told not to take ibuprofen after cardiac surgery   Nsaids Other (See Comments)   Unknown reaction      Medication List    TAKE these medications   acetaminophen 500 MG tablet Commonly known as: TYLENOL Take 500 mg by mouth every 6 (six) hours as needed for mild pain.   amLODipine 5 MG tablet Commonly known as: NORVASC Take 5 mg by mouth daily.   Apidra SoloStar 100 UNIT/ML Solostar Pen Generic drug: insulin glulisine Inject 0-45 Units into the skin See admin instructions. total daily dose up to 45 units  with meals and snacks   Aspirin Low Dose 81 MG EC tablet Generic drug: aspirin Take 81 mg by mouth daily.   Bactrim 400-80 MG tablet Generic drug: sulfamethoxazole-trimethoprim Take 1 tablet by mouth daily.   Baqsimi One Pack 3 MG/DOSE Powd Generic drug: Glucagon Place 1 spray into the nose  as needed (low blood sugar).   doxazosin 2 MG tablet Commonly known as: CARDURA Take 1 mg by mouth at bedtime.   INSULIN SYRINGE .3CC/31GX5/16" 31G X 5/16" 0.3 ML Misc 1 Act by Does not apply route 4 (four) times daily -  before meals and at bedtime.   metoCLOPramide 5 MG tablet Commonly known as: REGLAN Take 1 tablet (5 mg total) by mouth every 6 (six) hours as needed for up to 7 days for nausea. What changed:   when to take this  reasons to take this   mycophenolate 360 MG Tbec EC tablet Commonly known as: MYFORTIC Take 720 mg by mouth 2 (two) times daily.   pantoprazole 40 MG tablet Commonly known as: PROTONIX 40 mg daily.   polyethylene glycol powder 17 GM/SCOOP powder Commonly known as: GLYCOLAX/MIRALAX Take 17 g by mouth daily as needed for constipation.   pravastatin 40 MG tablet Commonly known as: PRAVACHOL Take 40 mg by mouth daily.   predniSONE 5 MG tablet Commonly known as: DELTASONE Take 5 mg by mouth daily with breakfast.   sertraline 50 MG tablet Commonly known as: ZOLOFT Take 50 mg by mouth daily.   sildenafil 25 MG tablet Commonly known as: VIAGRA Take by mouth.   Tacrolimus ER 1 MG Tb24 Take 6 mg by mouth daily before breakfast.   Evaristo Bury FlexTouch 100 UNIT/ML FlexTouch Pen Generic drug: insulin degludec Inject 14 Units into the skin at bedtime.   Vimpat 50 MG Tabs tablet Generic drug: lacosamide Take 50 mg by mouth 2 (two) times daily.       Time coordinating discharge: 35 minutes  The results of significant diagnostics from this hospitalization (including imaging, microbiology, ancillary and laboratory) are listed below for reference.    Procedures and Diagnostic Studies:   CT  HEAD WO CONTRAST  Result Date: 12/03/2020 CLINICAL DATA:  Acute encephalopathy.  Altered mental status. EXAM: CT HEAD WITHOUT CONTRAST TECHNIQUE: Contiguous axial images were obtained from the base of the skull through the vertex without intravenous contrast.  COMPARISON:  06/25/2020 and MRI on 02/29/2019 FINDINGS: Brain: No evidence of acute infarction, hemorrhage, hydrocephalus, or extra-axial collection. Old infarcts are again seen in the right posteroinferior cerebellum. A 2.2 cm cyst in the left parieto-occipital region adjacent to the atrium of the left lateral ventricle is stable, consistent with a neural glial cyst. Vascular:  No hyperdense vessel or other acute findings. Skull: No evidence of fracture or other significant bone abnormality. Sinuses/Orbits:  No acute findings. Other: None. IMPRESSION: No acute intracranial abnormality. Stable old right cerebellar infarcts. Stable left parieto-occipital neural glial cyst. Electronically Signed   By: Danae OrleansJohn A Stahl M.D.   On: 12/03/2020 13:09   DG Chest Portable 1 View  Result Date: 12/02/2020 CLINICAL DATA:  Vomiting.  History of heart transplant. EXAM: PORTABLE CHEST 1 VIEW COMPARISON:  October 13, 2020 FINDINGS: The heart size and mediastinal contours are within normal limits. Both lungs are clear. The visualized skeletal structures are unremarkable. The patient is status post prior median sternotomy. IMPRESSION: No active disease. Electronically Signed   By: Katherine Mantlehristopher  Green M.D.   On: 12/02/2020 22:03   CT CHEST ABDOMEN PELVIS WO CONTRAST  Result Date: 12/02/2020 CLINICAL DATA:  Cardiac transplant in March, recently ran out of gastroparesis medication, chest pain following emesis, concern for esophageal injury EXAM: CT CHEST, ABDOMEN AND PELVIS WITHOUT CONTRAST TECHNIQUE: Multidetector CT imaging of the chest, abdomen and pelvis was performed following the standard protocol without IV contrast. COMPARISON:  Radiograph 12/02/2020, MR thoracic spine 08/28/2018, CT thoracic spine 08/27/2018 FINDINGS: CT CHEST FINDINGS Cardiovascular: Cardiovascular evaluation limited in the absence of contrast media. There are postsurgical changes from prior sternotomy and orthotopic cardiac transplantation. No gross  complications are evident within the limitations of this unenhanced CT. Normal cardiac size. No pericardial effusion. Postsurgical changes along the normal caliber aorta. Incidental note made of the left vertebral artery arising directly from the aortic arch between the origins of the left common carotid and subclavian arteries. Aorta is otherwise unremarkable without periaortic stranding fluid. No major venous abnormalities are seen. Central pulmonary arteries are normal caliber. Luminal evaluation precluded in the absence of contrast media. Mediastinum/Nodes: There is marked circumferential thickening of the distal 2/3 of the thoracic esophagus. Minimal fluid within the esophageal lumen. No extraluminal gas or free fluid is evident however. No acute abnormality of the trachea. Thyroid gland and thoracic inlet are unremarkable. Postsurgical changes anterior mediastinum related to prior sternotomy. No worrisome mediastinal or axillary adenopathy. Hilar nodal evaluation is limited in the absence of intravenous contrast media. Lungs/Pleura: No consolidation, features of edema, pneumothorax, or effusion. No suspicious pulmonary nodules or masses. Musculoskeletal: Postsurgical changes from prior sternotomy with reinforced sternal sutures which appear intact and aligned as well as remote appearing deformities of several anterior ribs. Anterior wedging compression deformity T12 with up to 20% height loss anteriorly is new from prior. More remote marked vertebral body height loss at T4 with unchanged from prior. Additional milder biconvex deformities T2, T3, T7 and superior endplate deformities at T5 and T6, not significantly changed from comparison imaging. CT ABDOMEN PELVIS FINDINGS Hepatobiliary: No visible focal liver lesion with limitations of this unenhanced CT. Smooth liver surface contour. Normal liver attenuation. Normal gallbladder and biliary tree. Pancreas: No pancreatic ductal dilatation or surrounding  inflammatory  changes. Spleen: Normal in size. No concerning splenic lesions. Adrenals/Urinary Tract: Normal adrenal glands. Kidneys are normally located with symmetric enhancement and excretion. No suspicious renal lesion, urolithiasis or hydronephrosis. Urinary bladder is unremarkable for the degree of distention. Stomach/Bowel: Distal esophageal thickening, as above. Fluid-filled appearance of the stomach. Duodenum is unremarkable. No small bowel thickening or dilatation. Air-filled appendix in the right lower quadrant. No colonic dilatation or wall thickening. Vascular/Lymphatic: Limited assessment given absence of contrast media. No significant vascular findings are present. No enlarged abdominal or pelvic lymph nodes. Reproductive: The prostate and seminal vesicles are unremarkable. Other: No abdominopelvic free fluid or free gas. No bowel containing hernias. Musculoskeletal: Mild levocurvature of the lumbar spine, apex L3. No acute osseous abnormality or suspicious osseous lesion. IMPRESSION: 1. Marked circumferential thickening of the distal 2/3 of the thoracic esophagus, suggestive of esophagitis. No extraluminal gas or free fluid to suggest frank perforation. Consider further evaluation with endoscopy as clinically indicated. 2. Postsurgical changes from prior sternotomy and orthotopic cardiac transplantation. No gross complications are evident within the limitations of this unenhanced CT. 3. Anterior wedging compression deformity T12 with up to 20% height loss anteriorly, new from prior. Additional remote compression deformities T2-T7, not significantly changed from comparison imaging. Electronically Signed   By: Kreg Shropshire M.D.   On: 12/02/2020 22:35     Labs:   Basic Metabolic Panel: Recent Labs  Lab 12/03/20 0130 12/03/20 0448 12/03/20 1005 12/04/20 0153 12/05/20 0113 12/06/20 0405 12/07/20 0327  NA 134*   < > 135 135 137 133* 132*  K 4.1   < > 4.3 3.4* 3.4* 3.8 3.6  CL 88*   < > 93*  97* 101 100 100  CO2 28   < > 29 25 25 25 22   GLUCOSE 442*   < > 213* 131* 80 180* 189*  BUN 23*   < > 22* 17 12 8  5*  CREATININE 1.97*   < > 1.77* 1.23 1.18 1.09 0.97  CALCIUM 8.3*   < > 8.8* 8.7* 8.9 8.6* 8.3*  MG 1.7  --   --   --   --   --  1.1*  PHOS 5.2*  --   --   --   --   --  1.4*   < > = values in this interval not displayed.   GFR Estimated Creatinine Clearance: 79.4 mL/min (by C-G formula based on SCr of 0.97 mg/dL). Liver Function Tests: Recent Labs  Lab 12/02/20 2015 12/03/20 0448  AST 31 29  ALT 26 23  ALKPHOS 97 94  BILITOT 1.0 0.5  PROT 8.0 7.8  ALBUMIN 4.5 4.2   Recent Labs  Lab 12/02/20 2015  LIPASE 19   No results for input(s): AMMONIA in the last 168 hours. Coagulation profile Recent Labs  Lab 12/02/20 2134  INR 1.1    CBC: Recent Labs  Lab 12/03/20 0448 12/03/20 1005 12/04/20 0153 12/05/20 0113 12/06/20 0405 12/07/20 0327  WBC 9.7 9.2 5.5 4.0 3.6* 3.6*  NEUTROABS 7.4  --   --   --   --  1.5*  HGB 13.3 12.1* 11.5* 11.1* 10.5* 10.0*  HCT 39.9 36.7* 33.8* 33.9* 31.7* 28.4*  MCV 74.6* 76.1* 75.6* 75.7* 75.1* 73.8*  PLT 364 341 293 251 211 205   Cardiac Enzymes: Recent Labs  Lab 12/03/20 0130  CKTOTAL 115   BNP: Invalid input(s): POCBNP CBG: Recent Labs  Lab 12/06/20 2056 12/06/20 2132 12/07/20 0612 12/07/20 1101 12/07/20 1608  GLUCAP 57*  135* 210* 234* 234*   D-Dimer No results for input(s): DDIMER in the last 72 hours. Hgb A1c No results for input(s): HGBA1C in the last 72 hours. Lipid Profile No results for input(s): CHOL, HDL, LDLCALC, TRIG, CHOLHDL, LDLDIRECT in the last 72 hours. Thyroid function studies No results for input(s): TSH, T4TOTAL, T3FREE, THYROIDAB in the last 72 hours.  Invalid input(s): FREET3 Anemia work up No results for input(s): VITAMINB12, FOLATE, FERRITIN, TIBC, IRON, RETICCTPCT in the last 72 hours. Microbiology Recent Results (from the past 240 hour(s))  Resp Panel by RT-PCR (Flu A&B,  Covid) Nasopharyngeal Swab     Status: None   Collection Time: 12/02/20  8:28 PM   Specimen: Nasopharyngeal Swab; Nasopharyngeal(NP) swabs in vial transport medium  Result Value Ref Range Status   SARS Coronavirus 2 by RT PCR NEGATIVE NEGATIVE Final    Comment: (NOTE) SARS-CoV-2 target nucleic acids are NOT DETECTED.  The SARS-CoV-2 RNA is generally detectable in upper respiratory specimens during the acute phase of infection. The lowest concentration of SARS-CoV-2 viral copies this assay can detect is 138 copies/mL. A negative result does not preclude SARS-Cov-2 infection and should not be used as the sole basis for treatment or other patient management decisions. A negative result may occur with  improper specimen collection/handling, submission of specimen other than nasopharyngeal swab, presence of viral mutation(s) within the areas targeted by this assay, and inadequate number of viral copies(<138 copies/mL). A negative result must be combined with clinical observations, patient history, and epidemiological information. The expected result is Negative.  Fact Sheet for Patients:  BloggerCourse.comhttps://www.fda.gov/media/152166/download  Fact Sheet for Healthcare Providers:  SeriousBroker.ithttps://www.fda.gov/media/152162/download  This test is no t yet approved or cleared by the [place] FDA and  has been authorized for detection and/or diagnosis of SARS-CoV-2 by FDA under an Emergency Use Authorization (EUA). This EUA will remain  in effect (meaning this test can be used) for the duration of the COVID-19 declaration under Section 564(b)(1) of the Act, 21 U.S.C.section 360bbb-3(b)(1), unless the authorization is terminated  or revoked sooner.       Influenza A by PCR NEGATIVE NEGATIVE Final   Influenza B by PCR NEGATIVE NEGATIVE Final    Comment: (NOTE) The Xpert Xpress SARS-CoV-2/FLU/RSV plus assay is intended as an aid in the diagnosis of influenza from Nasopharyngeal swab specimens and should  not be used as a sole basis for treatment. Nasal washings and aspirates are unacceptable for Xpert Xpress SARS-CoV-2/FLU/RSV testing.  Fact Sheet for Patients: BloggerCourse.comhttps://www.fda.gov/media/152166/download  Fact Sheet for Healthcare Providers: SeriousBroker.ithttps://www.fda.gov/media/152162/download  This test is not yet approved or cleared by the [place] FDA and has been authorized for detection and/or diagnosis of SARS-CoV-2 by FDA under an Emergency Use Authorization (EUA). This EUA will remain in effect (meaning this test can be used) for the duration of the COVID-19 declaration under Section 564(b)(1) of the Act, 21 U.S.C. section 360bbb-3(b)(1), unless the authorization is terminated or revoked.  Performed at [facility], [address] [place], [place] [id number]   Blood culture (routine x 2)     Status: None   Collection Time: 12/02/20 10:40 PM   Specimen: BLOOD  Result Value Ref Range Status   Specimen Description BLOOD RIGHT ANTECUBITAL  Final   Special Requests   Final    BOTTLES DRAWN AEROBIC AND ANAEROBIC Blood Culture adequate volume   Culture   Final    NO GROWTH 5 DAYS Performed at [facility], [address] [place],  [place] [address]    Report Status 12/07/2020 FINAL  Final  Urine culture     Status: Abnormal   Collection Time: 12/02/20 10:41 PM   Specimen: In/Out Cath Urine  Result Value Ref Range Status   Specimen Description IN/OUT CATH URINE  Final   Special Requests NONE  Final   Culture (A)  Final    <10,000 COLONIES/mL INSIGNIFICANT GROWTH Performed at [facility], [address], [place], [place] [address]    Report Status 12/04/2020 FINAL  Final  Respiratory (~20 pathogens) panel by PCR     Status: None   Collection Time: 12/03/20  2:00 AM   Specimen: Nasopharyngeal Swab; Respiratory  Result Value Ref Range Status   Adenovirus NOT DETECTED NOT DETECTED Final   Coronavirus 229E NOT DETECTED NOT DETECTED Final    Comment: (NOTE) The  Coronavirus on the Respiratory Panel, DOES NOT test for the novel  Coronavirus (2019 nCoV)    Coronavirus HKU1 NOT DETECTED NOT DETECTED Final   Coronavirus NL63 NOT DETECTED NOT DETECTED Final   Coronavirus OC43 NOT DETECTED NOT DETECTED Final   Metapneumovirus NOT DETECTED NOT DETECTED Final   Rhinovirus / Enterovirus NOT DETECTED NOT DETECTED Final   Influenza A NOT DETECTED NOT DETECTED Final   Influenza B NOT DETECTED NOT DETECTED Final   Parainfluenza Virus 1 NOT DETECTED NOT DETECTED Final   Parainfluenza Virus 2 NOT DETECTED NOT DETECTED Final   Parainfluenza Virus 3 NOT DETECTED NOT DETECTED Final   Parainfluenza Virus 4 NOT DETECTED NOT DETECTED Final   Respiratory Syncytial Virus NOT DETECTED NOT DETECTED Final   Bordetella pertussis NOT DETECTED NOT DETECTED Final   Bordetella Parapertussis NOT DETECTED NOT DETECTED Final   Chlamydophila pneumoniae NOT DETECTED NOT DETECTED Final   Mycoplasma pneumoniae NOT DETECTED NOT DETECTED Final    Comment: Performed at [facility], [address], [place], [place] [address]     Signed: Lorin Glass  [facility] 12/08/2020, 7:26 AM

## 2021-02-18 ENCOUNTER — Encounter (HOSPITAL_COMMUNITY): Payer: Self-pay | Admitting: Student

## 2021-02-18 ENCOUNTER — Inpatient Hospital Stay (HOSPITAL_COMMUNITY)
Admission: EM | Admit: 2021-02-18 | Discharge: 2021-02-24 | DRG: 377 | Disposition: A | Discharge status: Other Acute Inpatient Hospital | Payer: No Typology Code available for payment source | Attending: Internal Medicine | Admitting: Internal Medicine

## 2021-02-18 DIAGNOSIS — Z7952 Long term (current) use of systemic steroids: Secondary | ICD-10-CM

## 2021-02-18 DIAGNOSIS — Z794 Long term (current) use of insulin: Secondary | ICD-10-CM

## 2021-02-18 DIAGNOSIS — K3184 Gastroparesis: Secondary | ICD-10-CM | POA: Diagnosis present

## 2021-02-18 DIAGNOSIS — K2971 Gastritis, unspecified, with bleeding: Secondary | ICD-10-CM | POA: Diagnosis not present

## 2021-02-18 DIAGNOSIS — R112 Nausea with vomiting, unspecified: Secondary | ICD-10-CM

## 2021-02-18 DIAGNOSIS — E1143 Type 2 diabetes mellitus with diabetic autonomic (poly)neuropathy: Secondary | ICD-10-CM | POA: Diagnosis present

## 2021-02-18 DIAGNOSIS — Z941 Heart transplant status: Secondary | ICD-10-CM

## 2021-02-18 DIAGNOSIS — E860 Dehydration: Secondary | ICD-10-CM | POA: Diagnosis present

## 2021-02-18 DIAGNOSIS — R748 Abnormal levels of other serum enzymes: Secondary | ICD-10-CM | POA: Diagnosis present

## 2021-02-18 DIAGNOSIS — E1043 Type 1 diabetes mellitus with diabetic autonomic (poly)neuropathy: Secondary | ICD-10-CM | POA: Diagnosis present

## 2021-02-18 DIAGNOSIS — Z20822 Contact with and (suspected) exposure to covid-19: Secondary | ICD-10-CM | POA: Diagnosis present

## 2021-02-18 DIAGNOSIS — E875 Hyperkalemia: Secondary | ICD-10-CM | POA: Diagnosis present

## 2021-02-18 DIAGNOSIS — D509 Iron deficiency anemia, unspecified: Secondary | ICD-10-CM | POA: Diagnosis present

## 2021-02-18 DIAGNOSIS — R6510 Systemic inflammatory response syndrome (SIRS) of non-infectious origin without acute organ dysfunction: Secondary | ICD-10-CM | POA: Diagnosis present

## 2021-02-18 DIAGNOSIS — I10 Essential (primary) hypertension: Secondary | ICD-10-CM | POA: Diagnosis present

## 2021-02-18 DIAGNOSIS — D84821 Immunodeficiency due to drugs: Secondary | ICD-10-CM | POA: Diagnosis present

## 2021-02-18 DIAGNOSIS — K920 Hematemesis: Secondary | ICD-10-CM | POA: Diagnosis present

## 2021-02-18 DIAGNOSIS — D75839 Thrombocytosis, unspecified: Secondary | ICD-10-CM | POA: Diagnosis present

## 2021-02-18 DIAGNOSIS — E1110 Type 2 diabetes mellitus with ketoacidosis without coma: Secondary | ICD-10-CM | POA: Diagnosis not present

## 2021-02-18 DIAGNOSIS — Z888 Allergy status to other drugs, medicaments and biological substances status: Secondary | ICD-10-CM

## 2021-02-18 DIAGNOSIS — B3781 Candidal esophagitis: Secondary | ICD-10-CM | POA: Diagnosis present

## 2021-02-18 DIAGNOSIS — G40909 Epilepsy, unspecified, not intractable, without status epilepticus: Secondary | ICD-10-CM

## 2021-02-18 DIAGNOSIS — R9431 Abnormal electrocardiogram [ECG] [EKG]: Secondary | ICD-10-CM | POA: Diagnosis present

## 2021-02-18 DIAGNOSIS — E1010 Type 1 diabetes mellitus with ketoacidosis without coma: Secondary | ICD-10-CM | POA: Diagnosis present

## 2021-02-18 DIAGNOSIS — Z79899 Other long term (current) drug therapy: Secondary | ICD-10-CM

## 2021-02-18 DIAGNOSIS — I160 Hypertensive urgency: Secondary | ICD-10-CM | POA: Diagnosis present

## 2021-02-18 DIAGNOSIS — N179 Acute kidney failure, unspecified: Secondary | ICD-10-CM | POA: Diagnosis present

## 2021-02-18 DIAGNOSIS — Z7982 Long term (current) use of aspirin: Secondary | ICD-10-CM

## 2021-02-18 DIAGNOSIS — Z8673 Personal history of transient ischemic attack (TIA), and cerebral infarction without residual deficits: Secondary | ICD-10-CM

## 2021-02-18 DIAGNOSIS — Z833 Family history of diabetes mellitus: Secondary | ICD-10-CM

## 2021-02-18 LAB — CBG MONITORING, ED: Glucose-Capillary: 580 mg/dL (ref 70–99)

## 2021-02-19 ENCOUNTER — Encounter (HOSPITAL_COMMUNITY)
Admission: EM | Disposition: A | Discharge status: Other Acute Inpatient Hospital | Payer: Self-pay | Source: Home / Self Care | Attending: Internal Medicine

## 2021-02-19 ENCOUNTER — Other Ambulatory Visit: Payer: Self-pay

## 2021-02-19 ENCOUNTER — Encounter (HOSPITAL_COMMUNITY): Payer: Self-pay | Admitting: *Deleted

## 2021-02-19 ENCOUNTER — Emergency Department (HOSPITAL_COMMUNITY): Payer: No Typology Code available for payment source

## 2021-02-19 ENCOUNTER — Inpatient Hospital Stay (HOSPITAL_COMMUNITY): Payer: No Typology Code available for payment source | Admitting: Anesthesiology

## 2021-02-19 DIAGNOSIS — N179 Acute kidney failure, unspecified: Secondary | ICD-10-CM | POA: Diagnosis present

## 2021-02-19 DIAGNOSIS — Z7952 Long term (current) use of systemic steroids: Secondary | ICD-10-CM | POA: Diagnosis not present

## 2021-02-19 DIAGNOSIS — D509 Iron deficiency anemia, unspecified: Secondary | ICD-10-CM | POA: Diagnosis present

## 2021-02-19 DIAGNOSIS — B3781 Candidal esophagitis: Secondary | ICD-10-CM | POA: Diagnosis present

## 2021-02-19 DIAGNOSIS — K920 Hematemesis: Secondary | ICD-10-CM | POA: Diagnosis not present

## 2021-02-19 DIAGNOSIS — Z794 Long term (current) use of insulin: Secondary | ICD-10-CM | POA: Diagnosis not present

## 2021-02-19 DIAGNOSIS — E1010 Type 1 diabetes mellitus with ketoacidosis without coma: Secondary | ICD-10-CM | POA: Diagnosis present

## 2021-02-19 DIAGNOSIS — R6510 Systemic inflammatory response syndrome (SIRS) of non-infectious origin without acute organ dysfunction: Secondary | ICD-10-CM | POA: Diagnosis present

## 2021-02-19 DIAGNOSIS — E860 Dehydration: Secondary | ICD-10-CM

## 2021-02-19 DIAGNOSIS — K3184 Gastroparesis: Secondary | ICD-10-CM | POA: Diagnosis present

## 2021-02-19 DIAGNOSIS — R748 Abnormal levels of other serum enzymes: Secondary | ICD-10-CM | POA: Diagnosis present

## 2021-02-19 DIAGNOSIS — E1143 Type 2 diabetes mellitus with diabetic autonomic (poly)neuropathy: Secondary | ICD-10-CM | POA: Diagnosis not present

## 2021-02-19 DIAGNOSIS — G40909 Epilepsy, unspecified, not intractable, without status epilepticus: Secondary | ICD-10-CM

## 2021-02-19 DIAGNOSIS — Z79899 Other long term (current) drug therapy: Secondary | ICD-10-CM | POA: Diagnosis not present

## 2021-02-19 DIAGNOSIS — Z8673 Personal history of transient ischemic attack (TIA), and cerebral infarction without residual deficits: Secondary | ICD-10-CM | POA: Diagnosis not present

## 2021-02-19 DIAGNOSIS — Z20822 Contact with and (suspected) exposure to covid-19: Secondary | ICD-10-CM | POA: Diagnosis present

## 2021-02-19 DIAGNOSIS — Z833 Family history of diabetes mellitus: Secondary | ICD-10-CM | POA: Diagnosis not present

## 2021-02-19 DIAGNOSIS — E1043 Type 1 diabetes mellitus with diabetic autonomic (poly)neuropathy: Secondary | ICD-10-CM | POA: Diagnosis present

## 2021-02-19 DIAGNOSIS — R9431 Abnormal electrocardiogram [ECG] [EKG]: Secondary | ICD-10-CM | POA: Diagnosis present

## 2021-02-19 DIAGNOSIS — I160 Hypertensive urgency: Secondary | ICD-10-CM | POA: Diagnosis present

## 2021-02-19 DIAGNOSIS — E1110 Type 2 diabetes mellitus with ketoacidosis without coma: Secondary | ICD-10-CM | POA: Diagnosis present

## 2021-02-19 DIAGNOSIS — Z941 Heart transplant status: Secondary | ICD-10-CM | POA: Diagnosis not present

## 2021-02-19 DIAGNOSIS — Z7982 Long term (current) use of aspirin: Secondary | ICD-10-CM | POA: Diagnosis not present

## 2021-02-19 DIAGNOSIS — D75839 Thrombocytosis, unspecified: Secondary | ICD-10-CM | POA: Diagnosis present

## 2021-02-19 DIAGNOSIS — E875 Hyperkalemia: Secondary | ICD-10-CM | POA: Diagnosis present

## 2021-02-19 DIAGNOSIS — K2971 Gastritis, unspecified, with bleeding: Secondary | ICD-10-CM | POA: Diagnosis present

## 2021-02-19 DIAGNOSIS — D84821 Immunodeficiency due to drugs: Secondary | ICD-10-CM | POA: Diagnosis present

## 2021-02-19 DIAGNOSIS — Z888 Allergy status to other drugs, medicaments and biological substances status: Secondary | ICD-10-CM | POA: Diagnosis not present

## 2021-02-19 HISTORY — PX: ESOPHAGOGASTRODUODENOSCOPY: SHX5428

## 2021-02-19 HISTORY — PX: ESOPHAGEAL BRUSHING: SHX6842

## 2021-02-19 LAB — HEMOGLOBIN AND HEMATOCRIT, BLOOD
HCT: 30.5 % — ABNORMAL LOW (ref 39.0–52.0)
HCT: 24.3 % — ABNORMAL LOW (ref 39.0–52.0)
Hemoglobin: 9.8 g/dL — ABNORMAL LOW (ref 13.0–17.0)
Hemoglobin: 7.9 g/dL — ABNORMAL LOW (ref 13.0–17.0)

## 2021-02-19 LAB — BETA-HYDROXYBUTYRIC ACID
Beta-Hydroxybutyric Acid: 6.53 mmol/L — ABNORMAL HIGH (ref 0.05–0.27)
Beta-Hydroxybutyric Acid: 2.83 mmol/L — ABNORMAL HIGH (ref 0.05–0.27)

## 2021-02-19 LAB — COMPREHENSIVE METABOLIC PANEL
ALT: 24 U/L (ref 0–44)
AST: 24 U/L (ref 15–41)
Albumin: 4.1 g/dL (ref 3.5–5.0)
Alkaline Phosphatase: 76 U/L (ref 38–126)
Anion gap: 19 — ABNORMAL HIGH (ref 5–15)
BUN: 28 mg/dL — ABNORMAL HIGH (ref 6–20)
CO2: 18 mmol/L — ABNORMAL LOW (ref 22–32)
Calcium: 9.2 mg/dL (ref 8.9–10.3)
Chloride: 100 mmol/L (ref 98–111)
Creatinine, Ser: 2.38 mg/dL — ABNORMAL HIGH (ref 0.61–1.24)
GFR, Estimated: 38 mL/min — ABNORMAL LOW (ref >60)
Glucose, Bld: 598 mg/dL (ref 70–99)
Potassium: 5.4 mmol/L — ABNORMAL HIGH (ref 3.5–5.1)
Sodium: 137 mmol/L (ref 135–145)
Total Bilirubin: 1.1 mg/dL (ref 0.3–1.2)
Total Protein: 7.3 g/dL (ref 6.5–8.1)

## 2021-02-19 LAB — BASIC METABOLIC PANEL
Anion gap: 20 — ABNORMAL HIGH (ref 5–15)
Anion gap: 12 (ref 5–15)
Anion gap: 6 (ref 5–15)
Anion gap: 8 (ref 5–15)
BUN: 31 mg/dL — ABNORMAL HIGH (ref 6–20)
BUN: 29 mg/dL — ABNORMAL HIGH (ref 6–20)
BUN: 25 mg/dL — ABNORMAL HIGH (ref 6–20)
BUN: 20 mg/dL (ref 6–20)
CO2: 15 mmol/L — ABNORMAL LOW (ref 22–32)
CO2: 23 mmol/L (ref 22–32)
CO2: 26 mmol/L (ref 22–32)
CO2: 24 mmol/L (ref 22–32)
Calcium: 8.9 mg/dL (ref 8.9–10.3)
Calcium: 8.7 mg/dL — ABNORMAL LOW (ref 8.9–10.3)
Calcium: 8.8 mg/dL — ABNORMAL LOW (ref 8.9–10.3)
Calcium: 8.6 mg/dL — ABNORMAL LOW (ref 8.9–10.3)
Chloride: 105 mmol/L (ref 98–111)
Chloride: 107 mmol/L (ref 98–111)
Chloride: 110 mmol/L (ref 98–111)
Chloride: 108 mmol/L (ref 98–111)
Creatinine, Ser: 2.44 mg/dL — ABNORMAL HIGH (ref 0.61–1.24)
Creatinine, Ser: 2.41 mg/dL — ABNORMAL HIGH (ref 0.61–1.24)
Creatinine, Ser: 2 mg/dL — ABNORMAL HIGH (ref 0.61–1.24)
Creatinine, Ser: 1.76 mg/dL — ABNORMAL HIGH (ref 0.61–1.24)
GFR, Estimated: 37 mL/min — ABNORMAL LOW (ref >60)
GFR, Estimated: 38 mL/min — ABNORMAL LOW (ref >60)
GFR, Estimated: 47 mL/min — ABNORMAL LOW (ref >60)
GFR, Estimated: 55 mL/min — ABNORMAL LOW (ref >60)
Glucose, Bld: 520 mg/dL (ref 70–99)
Glucose, Bld: 328 mg/dL — ABNORMAL HIGH (ref 70–99)
Glucose, Bld: 190 mg/dL — ABNORMAL HIGH (ref 70–99)
Glucose, Bld: 191 mg/dL — ABNORMAL HIGH (ref 70–99)
Potassium: 4.8 mmol/L (ref 3.5–5.1)
Potassium: 4.5 mmol/L (ref 3.5–5.1)
Potassium: 4.3 mmol/L (ref 3.5–5.1)
Potassium: 4 mmol/L (ref 3.5–5.1)
Sodium: 140 mmol/L (ref 135–145)
Sodium: 142 mmol/L (ref 135–145)
Sodium: 142 mmol/L (ref 135–145)
Sodium: 140 mmol/L (ref 135–145)

## 2021-02-19 LAB — CBC WITH DIFFERENTIAL/PLATELET
Abs Immature Granulocytes: 0.03 10*3/uL (ref 0.00–0.07)
Basophils Absolute: 0 10*3/uL (ref 0.0–0.1)
Basophils Relative: 0 %
Eosinophils Absolute: 0 10*3/uL (ref 0.0–0.5)
Eosinophils Relative: 0 %
HCT: 34.3 % — ABNORMAL LOW (ref 39.0–52.0)
Hemoglobin: 11.5 g/dL — ABNORMAL LOW (ref 13.0–17.0)
Immature Granulocytes: 1 %
Lymphocytes Relative: 8 %
Lymphs Abs: 0.5 10*3/uL — ABNORMAL LOW (ref 0.7–4.0)
MCH: 24.5 pg — ABNORMAL LOW (ref 26.0–34.0)
MCHC: 33.5 g/dL (ref 30.0–36.0)
MCV: 73.1 fL — ABNORMAL LOW (ref 80.0–100.0)
Monocytes Absolute: 0.1 10*3/uL (ref 0.1–1.0)
Monocytes Relative: 1 %
Neutro Abs: 5.8 10*3/uL (ref 1.7–7.7)
Neutrophils Relative %: 90 %
Platelets: 411 10*3/uL — ABNORMAL HIGH (ref 150–400)
RBC: 4.69 MIL/uL (ref 4.22–5.81)
RDW: 18.4 % — ABNORMAL HIGH (ref 11.5–15.5)
WBC: 6.5 10*3/uL (ref 4.0–10.5)
nRBC: 0 % (ref 0.0–0.2)

## 2021-02-19 LAB — URINALYSIS, ROUTINE W REFLEX MICROSCOPIC
Bacteria, UA: NONE SEEN
Bilirubin Urine: NEGATIVE
Glucose, UA: >=500 mg/dL — AB
Hgb urine dipstick: NEGATIVE
Ketones, ur: 80 mg/dL — AB
Leukocytes,Ua: NEGATIVE
Nitrite: NEGATIVE
Protein, ur: 30 mg/dL — AB
Specific Gravity, Urine: 1.015 (ref 1.005–1.030)
pH: 6 (ref 5.0–8.0)

## 2021-02-19 LAB — I-STAT VENOUS BLOOD GAS, ED
Acid-base deficit: 6 mmol/L — ABNORMAL HIGH (ref 0.0–2.0)
Bicarbonate: 19 mmol/L — ABNORMAL LOW (ref 20.0–28.0)
Calcium, Ion: 1.12 mmol/L — ABNORMAL LOW (ref 1.15–1.40)
HCT: 35 % — ABNORMAL LOW (ref 39.0–52.0)
Hemoglobin: 11.9 g/dL — ABNORMAL LOW (ref 13.0–17.0)
O2 Saturation: 99 %
Potassium: 5.7 mmol/L — ABNORMAL HIGH (ref 3.5–5.1)
Sodium: 137 mmol/L (ref 135–145)
TCO2: 20 mmol/L — ABNORMAL LOW (ref 22–32)
pCO2, Ven: 33.6 mmHg — ABNORMAL LOW (ref 44.0–60.0)
pH, Ven: 7.362 (ref 7.250–7.430)
pO2, Ven: 138 mmHg — ABNORMAL HIGH (ref 32.0–45.0)

## 2021-02-19 LAB — MAGNESIUM: Magnesium: 1.7 mg/dL (ref 1.7–2.4)

## 2021-02-19 LAB — CBG MONITORING, ED
Glucose-Capillary: 128 mg/dL — ABNORMAL HIGH (ref 70–99)
Glucose-Capillary: 130 mg/dL — ABNORMAL HIGH (ref 70–99)
Glucose-Capillary: 169 mg/dL — ABNORMAL HIGH (ref 70–99)
Glucose-Capillary: 170 mg/dL — ABNORMAL HIGH (ref 70–99)
Glucose-Capillary: 170 mg/dL — ABNORMAL HIGH (ref 70–99)
Glucose-Capillary: 172 mg/dL — ABNORMAL HIGH (ref 70–99)
Glucose-Capillary: 186 mg/dL — ABNORMAL HIGH (ref 70–99)
Glucose-Capillary: 198 mg/dL — ABNORMAL HIGH (ref 70–99)
Glucose-Capillary: 209 mg/dL — ABNORMAL HIGH (ref 70–99)
Glucose-Capillary: 232 mg/dL — ABNORMAL HIGH (ref 70–99)
Glucose-Capillary: 285 mg/dL — ABNORMAL HIGH (ref 70–99)
Glucose-Capillary: 380 mg/dL — ABNORMAL HIGH (ref 70–99)
Glucose-Capillary: 435 mg/dL — ABNORMAL HIGH (ref 70–99)
Glucose-Capillary: 506 mg/dL (ref 70–99)

## 2021-02-19 LAB — HEMOGLOBIN A1C
Hgb A1c MFr Bld: 9.3 % — ABNORMAL HIGH (ref 4.8–5.6)
Mean Plasma Glucose: 220.21 mg/dL

## 2021-02-19 LAB — RESP PANEL BY RT-PCR (FLU A&B, COVID) ARPGX2
Influenza A by PCR: NEGATIVE
Influenza B by PCR: NEGATIVE
SARS Coronavirus 2 by RT PCR: NEGATIVE

## 2021-02-19 LAB — TROPONIN I (HIGH SENSITIVITY): Troponin I (High Sensitivity): 6 ng/L (ref <18)

## 2021-02-19 LAB — TYPE AND SCREEN
ABO/RH(D): O NEG
Antibody Screen: NEGATIVE

## 2021-02-19 LAB — LIPASE, BLOOD: Lipase: 83 U/L — ABNORMAL HIGH (ref 11–51)

## 2021-02-19 SURGERY — ESOPHAGOGASTRODUODENOSCOPY (EGD) WITH PROPOFOL
Anesthesia: Monitor Anesthesia Care

## 2021-02-19 SURGERY — EGD (ESOPHAGOGASTRODUODENOSCOPY)
Anesthesia: General

## 2021-02-19 MED ORDER — SODIUM CHLORIDE 0.9 % IV SOLN
INTRAVENOUS | Status: DC
Start: 2021-02-19 — End: 2021-02-21
  Administered 2021-02-19: 1000 mL via INTRAVENOUS

## 2021-02-19 MED ORDER — TRIMETHOBENZAMIDE HCL 100 MG/ML IM SOLN: 200.0000 mg | Freq: Four times a day (QID) | INTRAMUSCULAR | Status: DC | PRN

## 2021-02-19 MED ORDER — LORAZEPAM 2 MG/ML IJ SOLN
1.0000 mg | Freq: Once | INTRAMUSCULAR | Status: AC
  Administered 2021-02-19: 1 mg via INTRAVENOUS
  Filled 2021-02-19: qty 1

## 2021-02-19 MED ORDER — TRIMETHOBENZAMIDE HCL 100 MG/ML IM SOLN
200.0000 mg | Freq: Four times a day (QID) | INTRAMUSCULAR | Status: DC | PRN
  Administered 2021-02-19 – 2021-02-20 (×2): 200 mg via INTRAMUSCULAR
  Filled 2021-02-19 (×5): qty 2

## 2021-02-19 MED ORDER — AMLODIPINE BESYLATE 10 MG PO TABS
10.0000 mg | ORAL_TABLET | Freq: Every day | ORAL | Status: DC
  Administered 2021-02-20 – 2021-02-23 (×4): 10 mg via ORAL
  Filled 2021-02-19 (×4): qty 1
  Filled 2021-02-19: qty 2

## 2021-02-19 MED ORDER — PANTOPRAZOLE SODIUM 40 MG IV SOLR
40.0000 mg | Freq: Two times a day (BID) | INTRAVENOUS | Status: DC
  Administered 2021-02-23 – 2021-02-24 (×4): 40 mg via INTRAVENOUS
  Filled 2021-02-19 (×4): qty 40

## 2021-02-19 MED ORDER — PRAVASTATIN SODIUM 40 MG PO TABS: 40.0000 mg | ORAL_TABLET | Freq: Every day | ORAL | Status: DC

## 2021-02-19 MED ORDER — SODIUM CHLORIDE 0.9 % IV SOLN
8.0000 mg/h | INTRAVENOUS | Status: DC
  Filled 2021-02-19: qty 80

## 2021-02-19 MED ORDER — HYDRALAZINE HCL 20 MG/ML IJ SOLN
INTRAMUSCULAR | Status: AC
  Filled 2021-02-19: qty 1

## 2021-02-19 MED ORDER — PRAVASTATIN SODIUM 40 MG PO TABS
40.0000 mg | ORAL_TABLET | Freq: Every day | ORAL | Status: DC
  Administered 2021-02-19 – 2021-02-24 (×6): 40 mg via ORAL
  Filled 2021-02-19 (×6): qty 1

## 2021-02-19 MED ORDER — DEXTROSE IN LACTATED RINGERS 5 % IV SOLN
INTRAVENOUS | Status: DC
  Administered 2021-02-19: 1000 mL via INTRAVENOUS

## 2021-02-19 MED ORDER — VALGANCICLOVIR HCL 450 MG PO TABS
450.0000 mg | ORAL_TABLET | Freq: Every day | ORAL | Status: DC
Start: 2021-02-19 — End: 2021-02-24
  Administered 2021-02-20 – 2021-02-24 (×5): 450 mg via ORAL
  Filled 2021-02-19 (×6): qty 1

## 2021-02-19 MED ORDER — TACROLIMUS 1 MG PO CAPS
10.0000 mg | ORAL_CAPSULE | Freq: Every day | ORAL | Status: DC
  Administered 2021-02-20: 10 mg via ORAL
  Filled 2021-02-19: qty 10

## 2021-02-19 MED ORDER — TACROLIMUS ER 1 MG PO TB24: 2.0000 mg | ORAL_TABLET | Freq: Every day | ORAL | Status: DC

## 2021-02-19 MED ORDER — MORPHINE SULFATE (PF) 4 MG/ML IV SOLN
4.0000 mg | Freq: Once | INTRAVENOUS | Status: AC
  Administered 2021-02-19: 4 mg via INTRAVENOUS
  Filled 2021-02-19: qty 1

## 2021-02-19 MED ORDER — LACTATED RINGERS IV SOLN
INTRAVENOUS | Status: AC | PRN
  Administered 2021-02-19: 1000 mL via INTRAVENOUS

## 2021-02-19 MED ORDER — PREDNISONE 5 MG PO TABS
5.0000 mg | ORAL_TABLET | Freq: Every day | ORAL | Status: DC
  Filled 2021-02-19: qty 1

## 2021-02-19 MED ORDER — LORAZEPAM 2 MG/ML IJ SOLN: 1.0000 mg | INTRAMUSCULAR | Status: DC | PRN

## 2021-02-19 MED ORDER — FENTANYL CITRATE (PF) 250 MCG/5ML IJ SOLN
INTRAMUSCULAR | Status: DC | PRN
  Administered 2021-02-19: 50 ug via INTRAVENOUS

## 2021-02-19 MED ORDER — MORPHINE SULFATE (PF) 2 MG/ML IV SOLN
2.0000 mg | INTRAVENOUS | Status: DC | PRN
  Administered 2021-02-19 – 2021-02-20 (×2): 2 mg via INTRAVENOUS
  Filled 2021-02-19 (×2): qty 1

## 2021-02-19 MED ORDER — MAGNESIUM SULFATE IN D5W 1-5 GM/100ML-% IV SOLN
1.0000 g | Freq: Once | INTRAVENOUS | Status: AC
  Administered 2021-02-19: 1 g via INTRAVENOUS
  Filled 2021-02-19: qty 100

## 2021-02-19 MED ORDER — LIDOCAINE 2% (20 MG/ML) 5 ML SYRINGE
INTRAMUSCULAR | Status: DC | PRN
  Administered 2021-02-19: 40 mg via INTRAVENOUS

## 2021-02-19 MED ORDER — FENTANYL CITRATE (PF) 100 MCG/2ML IJ SOLN
INTRAMUSCULAR | Status: AC
  Filled 2021-02-19: qty 2

## 2021-02-19 MED ORDER — PREDNISONE 10 MG PO TABS
5.0000 mg | ORAL_TABLET | Freq: Every day | ORAL | Status: DC
  Administered 2021-02-19 – 2021-02-21 (×3): 5 mg via ORAL
  Filled 2021-02-19 (×3): qty 1

## 2021-02-19 MED ORDER — HEPARIN SODIUM (PORCINE) 5000 UNIT/ML IJ SOLN: 5000.0000 [IU] | Freq: Three times a day (TID) | INTRAMUSCULAR | Status: DC

## 2021-02-19 MED ORDER — LACOSAMIDE 50 MG PO TABS
50.0000 mg | ORAL_TABLET | Freq: Two times a day (BID) | ORAL | Status: DC
  Administered 2021-02-19 – 2021-02-24 (×9): 50 mg via ORAL
  Filled 2021-02-19 (×9): qty 1

## 2021-02-19 MED ORDER — PROPOFOL 10 MG/ML IV BOLUS
INTRAVENOUS | Status: DC | PRN
  Administered 2021-02-19: 120 mg via INTRAVENOUS

## 2021-02-19 MED ORDER — SODIUM CHLORIDE 0.9 % IV SOLN
50.0000 mg | Freq: Two times a day (BID) | INTRAVENOUS | Status: DC
  Filled 2021-02-19 (×3): qty 5

## 2021-02-19 MED ORDER — HYDRALAZINE HCL 20 MG/ML IJ SOLN: 10.0000 mg | INTRAMUSCULAR | Status: DC | PRN

## 2021-02-19 MED ORDER — LACTATED RINGERS IV BOLUS
1000.0000 mL | Freq: Once | INTRAVENOUS | Status: AC
  Administered 2021-02-19: 1000 mL via INTRAVENOUS

## 2021-02-19 MED ORDER — SUCCINYLCHOLINE CHLORIDE 200 MG/10ML IV SOSY
PREFILLED_SYRINGE | INTRAVENOUS | Status: DC | PRN
  Administered 2021-02-19: 80 mg via INTRAVENOUS

## 2021-02-19 MED ORDER — SODIUM CHLORIDE 0.9 % IV BOLUS
1000.0000 mL | Freq: Once | INTRAVENOUS | Status: AC
  Administered 2021-02-19: 1000 mL via INTRAVENOUS

## 2021-02-19 MED ORDER — SODIUM CHLORIDE 0.9 % IV SOLN
80.0000 mg | Freq: Once | INTRAVENOUS | Status: DC
  Filled 2021-02-19 (×2): qty 80

## 2021-02-19 MED ORDER — DEXTROSE 50 % IV SOLN
0.0000 mL | INTRAVENOUS | Status: DC | PRN
  Administered 2021-02-21: 50 mL via INTRAVENOUS
  Filled 2021-02-19: qty 50

## 2021-02-19 MED ORDER — DOXAZOSIN MESYLATE 1 MG PO TABS
1.0000 mg | ORAL_TABLET | Freq: Every day | ORAL | Status: DC
  Administered 2021-02-19 – 2021-02-23 (×5): 1 mg via ORAL
  Filled 2021-02-19 (×7): qty 1

## 2021-02-19 MED ORDER — ONDANSETRON HCL 4 MG/2ML IJ SOLN
INTRAMUSCULAR | Status: DC | PRN
  Administered 2021-02-19: 4 mg via INTRAVENOUS

## 2021-02-19 MED ORDER — INSULIN REGULAR(HUMAN) IN NACL 100-0.9 UT/100ML-% IV SOLN
INTRAVENOUS | Status: DC
  Administered 2021-02-19: 4.8 [IU]/h via INTRAVENOUS
  Filled 2021-02-19: qty 100

## 2021-02-19 NOTE — Op Note (Signed)
[facility] Patient Name: Manuel Baker Procedure Date : 02/19/2021 MRN: [id number] Attending MD: Juanita Craver , MD Date of Birth: 1995/12/18 CSN: [id number] Age: 25 Admit Type: Inpatient Procedure:                EGD with esophageal brushings. Indications:              Coffee-ground emesis/Hematemesis, Nausea with                            vomiting-diabetic gastroparesis. Providers:                Juanita Craver, MD, Particia Nearing, RN, Ladona Ridgel,                            Technician, Wyatt Haste Muqtasid, CRNA. Referring MD:             [facility]. Medicines:                Monitored Anesthesia Care Complications:            No immediate complications. Estimated Blood Loss:     Estimated blood loss: none. Procedure:                Pre-Anesthesia Assessment: - Prior to the                            procedure, a history and physical was performed,                            and patient medications and allergies were                            reviewed. The patient's tolerance of previous                            anesthesia was also reviewed. The risks and                            benefits of the procedure and the sedation options                            and risks were discussed with the patient. All                            questions were answered, and informed consent was                            obtained. Prior Anticoagulants: The patient has                            taken no previous anticoagulant or antiplatelet                            agents except for aspirin. ASA Grade Assessment:  III - A patient with severe systemic disease. After                            reviewing the risks and benefits, the patient was                            deemed in satisfactory condition to undergo the                            procedure. After obtaining informed consent, the                            endoscope was passed under  direct vision.                            Throughout the procedure, the patient's blood                            pressure, pulse, and oxygen saturations were                            monitored continuously. The GIF-H190 ([id number])                            Olympus gastroscope was introduced through the                            mouth, and advanced to the second part of duodenum.                            The EGD was accomplished without difficulty. The                            patient tolerated the procedure well. Scope In: Scope Out: Findings:      Diffuse, white plaques were found in the entire esophagus; cells for       cytology were obtained by brushing; no M-W tear noted.      Patchy moderate inflammation characterized by erythema, friability and       granularity was found in the gastric body-no fresh heme noted on exam.      The cardia and gastric fundus were normal on retroflexion except for       some coffee grounds.      The examined duodenum was normal. Impression:               - Whitish esophageal mucosa/plaques were found,                            suspicious for candidiasis-cells for cytology                            obtained; no M-W tear was noted.                           - Patchy gastritis in the cardia; some coffee  grounds noted but no fresh heme noted on exam.                           - Normal examined duodenum. Moderate Sedation:      MAC used. Recommendation:           - Clear liquid diet.                           - Continue present medications including IV Insulin.                           - Await the results of the brushings.                           - Use Protonix (Pantoprazole) 40 mg IV daily. Procedure Code(s):        --- Professional ---                           [phone], Esophagogastroduodenoscopy, flexible,                            transoral; diagnostic, including collection of                             specimen(s) by brushing or washing, when performed                            (separate procedure) Diagnosis Code(s):        --- Professional ---                           K92.0, Hematemesis                           R11.2, Nausea with vomiting, unspecified                           K29.70, Gastritis, unspecified, without bleeding                           K22.9, Disease of esophagus, unspecified CPT copyright 2019 American Medical Association. All rights reserved. The codes documented in this report are preliminary and upon coder review may  be revised to meet current compliance requirements. Juanita Craver, MD Juanita Craver, MD 02/19/2021 3:01:30 PM This report has been signed electronically. Number of Addenda: 0

## 2021-02-19 NOTE — ED Notes (Addendum)
Patient c/o abdominal pain and feeling nauseous. Patient also tachy 110-120s Provider made aware.

## 2021-02-19 NOTE — ED Provider Notes (Signed)
[facility] EMERGENCY [facility] Provider Note   CSN: [id number] Arrival date & time: 02/18/21  2351     History Chief Complaint  Patient presents with  . Hyperglycemia    Manuel Baker is a 25 y.o. male with a hx of T1DM, gastroparesis, & prior heart transplant on prednisone/tacrolimus who presents to the ED via EMS with complaints of N/V since yesterday. Patient reports significant nausea with too numerous to count episodes of emesis since yesterday. At times has seen small streaks of blood, but no large volume hematemesis or coffee ground emesis. Has had 1 episode of diarrhea with his N/V and has developed generalized abdominal pain and bilateral rib/chest soreness, states everything hurts from vomiting. No other alleviating/aggravating factors. Tried zofran @ home without relief. He states his sxs feel like his prior gastroparesis problems. He has not been able to keep anything down, his blood sugars have been running high. Per EMS patient with CBG > 600 per their reading, gave 4mg  of IV zofran without relief, continues to vomit.  Patient denies fever, melena, hematochezia, dysuria, dyspnea, or cough.  HPI     Past Medical History:  Diagnosis Date  . Congenital heart disease   . Diabetes mellitus without complication (HCC)   . Seizures [facility](HCC)     Patient Active Problem List   Diagnosis Date Noted  . Intractable nausea and vomiting 12/07/2020  . Protein-calorie malnutrition, severe 12/05/2020  . Hyperglycemia 12/03/2020  . History of stroke 12/03/2020  . Esophagitis 12/03/2020  . Elevated lactic acid level 12/03/2020  . Diabetic gastroparesis (HCC) 12/03/2020  . Dehydration 12/03/2020  . Non-intractable vomiting   . Hematemesis with nausea   . Septic embolism (HCC)   . Libman Sacks endocarditis (HCC)   . Leucocytosis   . [place] endocarditis   . 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  Uncontrolled type 1 diabetes mellitus with hyperglycemia (HCC) 01/03/2018  . Seizure (HCC) 01/03/2018  . Stroke (cerebrum) (HCC) 01/03/2018  . Stroke (HCC) 01/03/2018  . Ebstein's anomaly     Past Surgical History:  Procedure Laterality Date  . open heart surgery    . TEE WITHOUT CARDIOVERSION N/A 01/06/2018   Procedure: TRANSESOPHAGEAL ECHOCARDIOGRAM (TEE);  Surgeon: Wendall StadeNishan, Peter C, MD;  [facility];  Service: Cardiovascular;  Laterality: N/A;       Family History  Problem Relation Age of Onset  . Diabetes Mellitus I Sister     Social History   Tobacco Use  . Smoking status: Never Smoker  . Smokeless tobacco: Never Used  Vaping Use  . Vaping Use: Never used  Substance Use Topics  . Alcohol use: Yes    Comment: social  . Drug use: Yes    Types: Marijuana    Home Medications Prior to Admission medications   Medication Sig Start Date End Date Taking? Authorizing Provider  acetaminophen (TYLENOL) 500 MG tablet Take 500 mg by mouth every 6 (six) hours as needed for mild pain.    [provider]  amLODipine (NORVASC) 5 MG tablet Take 5 mg by mouth daily.  10/01/20 10/01/21  [provider]  ASPIRIN LOW DOSE 81 MG EC tablet Take 81 mg by mouth daily. 09/02/20   [provider]  doxazosin (CARDURA) 2 MG tablet Take 1 mg by mouth at bedtime. 10/31/20 10/31/21  [provider]  Glucagon (BAQSIMI ONE PACK) 3 MG/DOSE POWD Place 1 spray into the nose as  needed (low blood sugar).    [provider]  insulin degludec (TRESIBA FLEXTOUCH) 100 UNIT/ML FlexTouch Pen Inject 14 Units into the skin at bedtime.  08/04/20   [provider]  insulin glulisine (APIDRA SOLOSTAR) 100 UNIT/ML Solostar Pen Inject 0-45 Units into the skin See admin instructions. total daily dose up to 45 units  with meals and snacks 08/04/20   [provider]  Insulin Syringe-Needle U-100 (INSULIN SYRINGE .3CC/31GX5/16") 31G X 5/16" 0.3 ML MISC 1 Act  by Does not apply route 4 (four) times daily -  before meals and at bedtime. 01/09/18   Rolly Salter, MD  metoCLOPramide (REGLAN) 5 MG tablet Take 1 tablet (5 mg total) by mouth every 6 (six) hours as needed for up to 7 days for nausea. 12/07/20 12/14/20  Lorin Glass, MD  mycophenolate (MYFORTIC) 360 MG TBEC EC tablet Take 720 mg by mouth 2 (two) times daily.    [provider]  pantoprazole (PROTONIX) 40 MG tablet 40 mg daily.  09/01/20 09/01/21  [provider]  polyethylene glycol powder (GLYCOLAX/MIRALAX) 17 GM/SCOOP powder Take 17 g by mouth daily as needed for constipation. 10/18/20   [provider]  pravastatin (PRAVACHOL) 40 MG tablet Take 40 mg by mouth daily. 09/02/20   [provider]  predniSONE (DELTASONE) 5 MG tablet Take 5 mg by mouth daily with breakfast.  08/04/20   [provider]  sertraline (ZOLOFT) 50 MG tablet Take 50 mg by mouth daily.    [provider]  sildenafil (VIAGRA) 25 MG tablet Take by mouth. 09/14/20 12/03/20  [provider]  sulfamethoxazole-trimethoprim (BACTRIM) 400-80 MG tablet Take 1 tablet by mouth daily.    [provider]  Tacrolimus ER 1 MG TB24 Take 6 mg by mouth daily before breakfast. 10/31/20 10/31/21  [provider]  VIMPAT 50 MG TABS tablet Take 50 mg by mouth 2 (two) times daily. 08/04/20   [provider]    Allergies    Ibuprofen and Nsaids  Review of Systems   Review of Systems  Constitutional: Negative for chills and fever.  Respiratory: Negative for shortness of breath.   Cardiovascular: Positive for chest pain.  Gastrointestinal: Positive for abdominal pain, diarrhea, nausea and vomiting. Negative for anal bleeding, blood in stool and constipation.  Genitourinary: Negative for dysuria.  Neurological: Negative for syncope.  All other systems reviewed and are negative.   Physical Exam Updated Vital Signs BP (!) 174/91 (BP Location: Right Arm)    Pulse (!) 127   Temp 99.4 F (37.4 C) (Oral)   Resp (!) 26   SpO2 100%   Physical Exam Vitals and nursing note reviewed.  Constitutional:      General: He is not in acute distress.    Appearance: He is well-developed. He is not toxic-appearing.  HENT:     Head: Normocephalic and atraumatic.     Mouth/Throat:     Mouth: Mucous membranes are dry.  Eyes:     General:        Right eye: No discharge.        Left eye: No discharge.     Conjunctiva/sclera: Conjunctivae normal.  Cardiovascular:     Rate and Rhythm: Regular rhythm. Tachycardia present.  Pulmonary:     Effort: Pulmonary effort is normal. No respiratory distress.     Breath sounds: Normal breath sounds. No wheezing, rhonchi or rales.     Comments: Surgical scar present.  Chest:     Chest  wall: Tenderness (bilateral) present.  Abdominal:     General: There is no distension.     Palpations: Abdomen is soft.     Tenderness: There is abdominal tenderness (mild generalized most prominent in the epigastrium). There is no guarding or rebound.  Musculoskeletal:     Cervical back: Neck supple.     Right lower leg: No edema.     Left lower leg: No edema.  Skin:    General: Skin is warm and dry.     Findings: No rash.  Neurological:     Mental Status: He is alert.     Comments: Clear speech.   Psychiatric:        Behavior: Behavior normal.    ED Results / Procedures / Treatments   Labs (all labs ordered are listed, but only abnormal results are displayed) Labs Reviewed  COMPREHENSIVE METABOLIC PANEL - Abnormal; Notable for the following components:      Result Value   Potassium 5.4 (*)    CO2 18 (*)    Glucose, Bld 598 (*)    BUN 28 (*)    Creatinine, Ser 2.38 (*)    GFR, Estimated 38 (*)    Anion gap 19 (*)    All other components within normal limits  CBC WITH DIFFERENTIAL/PLATELET - Abnormal; Notable for the following components:   Hemoglobin 11.5 (*)    HCT 34.3 (*)    MCV 73.1 (*)    MCH 24.5 (*)     RDW 18.4 (*)    Platelets 411 (*)    Lymphs Abs 0.5 (*)    All other components within normal limits  LIPASE, BLOOD - Abnormal; Notable for the following components:   Lipase 83 (*)    All other components within normal limits  URINALYSIS, ROUTINE W REFLEX MICROSCOPIC - Abnormal; Notable for the following components:   Color, Urine STRAW (*)    Glucose, UA >=500 (*)    Ketones, ur 80 (*)    Protein, ur 30 (*)    All other components within normal limits  CBG MONITORING, ED - Abnormal; Notable for the following components:   Glucose-Capillary 580 (*)    All other components within normal limits  I-STAT VENOUS BLOOD GAS, ED - Abnormal; Notable for the following components:   pCO2, Ven 33.6 (*)    pO2, Ven 138.0 (*)    Bicarbonate 19.0 (*)    TCO2 20 (*)    Acid-base deficit 6.0 (*)    Potassium 5.7 (*)    Calcium, Ion 1.12 (*)    HCT 35.0 (*)    Hemoglobin 11.9 (*)    All other components within normal limits  MAGNESIUM  BETA-HYDROXYBUTYRIC ACID  BETA-HYDROXYBUTYRIC ACID  BETA-HYDROXYBUTYRIC ACID  TROPONIN I (HIGH SENSITIVITY)  TROPONIN I (HIGH SENSITIVITY)    EKG EKG Interpretation  Date/Time:  Monday February 19 2021 00:01:52 EDT Ventricular Rate:  124 PR Interval:  126 QRS Duration: 90 QT Interval:  406 QTC Calculation: 583 R Axis:   102 Text Interpretation: Critical Test Result: Long QTc Sinus tachycardia Biatrial enlargement Possible Right ventricular hypertrophy Junctional ST depression, probably normal Abnormal ECG When compared with ECG of 12/02/2020, QT has lengthened Confirmed by Dione Booze ([id number]) on 02/19/2021 12:07:28 AM   Radiology DG Chest 2 View  Result Date: 02/19/2021 CLINICAL DATA:  Chest pain EXAM: CHEST - 2 VIEW COMPARISON:  12/02/2020 FINDINGS: The heart size and mediastinal contours are within normal limits. Both lungs are clear. The visualized skeletal  structures are unremarkable. Postsurgical changes are noted. IMPRESSION: No active  cardiopulmonary disease. Electronically Signed   By: Alcide Clever M.D.   On: 02/19/2021 00:49    Procedures .Critical Care Performed by: Cherly Anderson, PA-C Authorized by: Cherly Anderson, PA-C    CRITICAL CARE Performed by: Harvie Heck   Total critical care time: 50 minutes  Critical care time was exclusive of separately billable procedures and treating other patients.  Critical care was necessary to treat or prevent imminent or life-threatening deterioration.  Critical care was time spent personally by me on the following activities: development of treatment plan with patient and/or surrogate as well as nursing, discussions with consultants, evaluation of patient's response to treatment, examination of patient, obtaining history from patient or surrogate, ordering and performing treatments and interventions, ordering and review of laboratory studies, ordering and review of radiographic studies, pulse oximetry and re-evaluation of patient's condition.    Medications Ordered in ED Medications  lactated ringers bolus 1,000 mL (has no administration in time range)  LORazepam (ATIVAN) injection 1 mg (has no administration in time range)    ED Course  I have reviewed the triage vital signs and the nursing notes.  Pertinent labs & imaging results that were available during my care of the patient were reviewed by me and considered in my medical decision making (see chart for details).    MDM Rules/Calculators/A&P                         Patient presents to the ED with complaints of N/V with chest/abdominal discomfort.  On arrival patient is tachycardia, hypertensive, and has generalized abdominal tenderness with most prominent tenderness in the epigastrium, also has chest wall tenderness. No peritoneal signs on abdominal exam.   EKG: When compared with ECG of 12/02/2020, QT has lengthened --> ativan ordered for nausea/vomiting give QTc prolongation. 1L LR  ordered as well.   Additional history obtained:  Additional history obtained from chart review & nursing note review.  ED visit for similar 11/2020, had CT chest/abdomen/pelvis @ that time: 1. Marked circumferential thickening of the distal 2/3 of the thoracic esophagus, suggestive of esophagitis. No extraluminal gas or free fluid to suggest frank perforation. Consider further evaluation with endoscopy as clinically indicated. 2. Postsurgical changes from prior sternotomy and orthotopic cardiac transplantation. No gross complications are evident within the limitations of this unenhanced CT. 3. Anterior wedging compression deformity T12 with up to 20% height loss anteriorly, new from prior. Additional remote compression deformities T2-T7, not significantly changed from comparison Imaging.  Lab Tests:  I Ordered, reviewed, and interpreted labs, which included:  CBC: Anemia similar to prior, no significant leukocytosis. CMP: Findings concerning for DKA with glucose of 598, bicarb of 18, anion gap 19, ABG with normal pH.  Patient also hyperkalemic at 5.4.  Acute kidney injury with creatinine 2.3 and BUN of 28, baseline creatinine appears to be 0.9-1.2. Lipase: Mild elevation at 83 Magnesium: Within normal limits Troponin: Within normal limits Urinalysis: Ketonuria, glucosuria, proteinuria, no UTI.  Imaging Studies ordered:  I ordered imaging studies which included chest x-ray, I independently reviewed, formal radiology impression shows:  No active cardiopulmonary disease.  ED Course:  Patient with continued emesis as well as discomfort therefore redose of Ativan ordered as well as a dose of morphine.  04:00: RE-EVAL: Patient is resting more comfortably, no further vomiting at this time.  Patient is repeat abdominal exam remains without peritoneal signs.  States  pain feels similar to prior gastroparesis, similar presentation in January with CT imaging as above.  Overall low suspicion for  acute surgical process.  His laboratory findings are concerning for a degree of DKA with an acute kidney injury and mild hyperkalemia.  Patient will be given additional liter of fluids (normal saline) and started on an insulin drip.  Plan discussed with hospital service for admission. Patient updated on results & plan of care & is in agreement.   04:15: CONSULT: Discussed with hospitalist Dr. Leafy Half, will discuss w/ [facility] transplant team to ensure okay to stay at this facility.   04:20: CONSULT: Discussed with [facility] transfer line, pending call back.   05:42: CONSULT: Discussed with Dr. Welton Flakes, on call for heart transplant team @ [facility], okay to admit patient here, can call back at any time if he starts to get worse/more complicated.   05:58: CONSULT: Re-discussed with Dr. Leafy Half- accepts admission.   Blood pressure 130/75, pulse (!) 110, temperature 98.4 F (36.9 C), temperature source Oral, resp. rate 16, height 5\' 7"  (1.702 m), weight 47.4 kg, SpO2 98 %.  Findings and plan of care discussed with supervising physician Dr. who has evaluated the patient & is in agreement.   Portions of this note were generated with Preston Fleeting. Dictation errors may occur despite best attempts at proofreading.  Final Clinical Impression(s) / ED Diagnoses Final diagnoses:  Nausea and vomiting, intractability of vomiting not specified, unspecified vomiting type  AKI (acute kidney injury) (HCC)  Diabetic ketoacidosis without coma associated with type 1 diabetes mellitus [facility])    Rx / DC Orders ED Discharge Orders    None       [facility], PA-C 02/19/21 02/21/21    [id number], MD 02/19/21 [phone]

## 2021-02-19 NOTE — ED Notes (Signed)
Pt called out requesting emesis basin. Pt with approx coffee ground emesis with large clot. Dr. Katrinka Blazing made aware and GI consult placed for patient. New order for antiemetics received.

## 2021-02-19 NOTE — Anesthesia Procedure Notes (Addendum)
Procedure Name: Intubation Date/Time: 02/19/2021 2:37 PM Performed by: Darletta Moll, CRNA Pre-anesthesia Checklist: Patient identified, Emergency Drugs available, Suction available and Patient being monitored Patient Re-evaluated:Patient Re-evaluated prior to induction Oxygen Delivery Method: Circle system utilized Preoxygenation: Pre-oxygenation with 100% oxygen Induction Type: IV induction Ventilation: Mask ventilation without difficulty Laryngoscope Size: Mac and 4 Grade View: Grade I Tube type: Oral Tube size: 7.5 mm Number of attempts: 1 Airway Equipment and Method: Stylet Placement Confirmation: ETT inserted through vocal cords under direct vision,  positive ETCO2 and breath sounds checked- equal and bilateral Secured at: 21 cm Tube secured with: Tape Dental Injury: Teeth and Oropharynx as per pre-operative assessment

## 2021-02-19 NOTE — Anesthesia Preprocedure Evaluation (Signed)
Anesthesia Evaluation    Airway Mallampati: II  TM Distance: >3 FB Neck ROM: Full    Dental  (+) Teeth Intact, Dental Advisory Given   Pulmonary    breath sounds clear to auscultation       Cardiovascular Exercise Tolerance: Good  Rhythm:Regular Rate:Tachycardia  S/p heart transplant 3/21   Neuro/Psych CVA, No Residual Symptoms    GI/Hepatic   Endo/Other  diabetes, Type 1, Insulin Dependent  Renal/GU      Musculoskeletal   Abdominal   Peds  Hematology   Anesthesia Other Findings   Reproductive/Obstetrics                             Anesthesia Physical Anesthesia Plan  ASA: III  Anesthesia Plan: General   Post-op Pain Management:    Induction: Intravenous, Rapid sequence and Cricoid pressure planned  PONV Risk Score and Plan: 2 and Ondansetron, Midazolam and Treatment may vary due to age or medical condition  Airway Management Planned: Oral ETT  Additional Equipment:   Intra-op Plan:   Post-operative Plan: Extubation in OR  Informed Consent: I have reviewed the patients History and Physical, chart, labs and discussed the procedure including the risks, benefits and alternatives for the proposed anesthesia with the patient or authorized representative who has indicated his/her understanding and acceptance.     Dental advisory given  Plan Discussed with:   Anesthesia Plan Comments:         Anesthesia Quick Evaluation

## 2021-02-19 NOTE — H&P (Addendum)
History and Physical    Manuel Baker JOA:[id number] DOB: [birth year] DOA: 02/18/2021  Referring MD/NP/PA: Shauna Hugh, MD PCP: Demaris Callander [facility]  Patient coming from: Via EMS  Chief Complaint: Nausea and vomiting  I have personally briefly reviewed patient's old medical records in [facility]   HPI: Manuel Baker is a 25 y.o. 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 Patient presented with complaints of a 2-day history of nausea and vomiting.  He has had constant nausea with several episodes of vomiting and has been unable to keep any significant mouth food or liquids down. Noted streaks of blood some bright red and some dark present in emesis, but denied vomiting frank blood.  Associated symptoms include aches all over with midline abdominal pain complaints of shortness of breath.  Symptoms feel similar to previous episodes of gastroparesis.  Denies having any significant fever, chest pain, leg swelling, blood in stools.  He does admit that he has not been keeping up with his blood sugars or diabetes treat currently.  In route with EMS patient had been noted to have blood glucose greater than 600 and had been given IV Zofran without relief in vomiting.  ED Course: Upon admission into the emergency department patient was seen to be afebrile, pulse 1 10-1 27, respirations 16-26, blood pressure 130/75-174/91, and O2 saturations maintained on room air labs significant for WBC 6.5, hemoglobin 11.9, platelets 411, potassium 5.4, CO2 18, BUN 28, creatinine 2.38, glucose 598, anion gap 19, lipase 83, and high-sensitivity troponin 6.  Venous pH was 7.362.  Chest x-ray was otherwise  noted to be clear.  Patient was given 2 L of IV fluids, Ativan IV for nausea due to prolonged QT, magnesium sulfate 1 g IV, morphine, and started on insulin drip.  Patient's case had been discussed with [facility] for transfer, but they declined need of transfer.  Review of Systems  Constitutional: Positive for malaise/fatigue. Negative for fever.  HENT: Negative for nosebleeds.   Eyes: Negative for photophobia and pain.  Respiratory: Positive for shortness of breath.   Cardiovascular: Negative for chest pain and leg swelling.  Gastrointestinal: Positive for abdominal pain, nausea and vomiting. Negative for diarrhea.  Genitourinary: Negative for dysuria and hematuria.  Musculoskeletal: Positive for myalgias. Negative for falls.  Skin: Negative for rash.  Neurological: Negative for focal weakness and loss of consciousness.  Psychiatric/Behavioral: Negative for substance abuse.    Past Medical History:  Diagnosis Date  . Congenital heart disease   . Diabetes mellitus without complication (HCC)   . Seizures (HCC)     Past Surgical History:  Procedure Laterality Date  . open heart surgery    . TEE WITHOUT CARDIOVERSION N/A 01/06/2018   Procedure: TRANSESOPHAGEAL ECHOCARDIOGRAM (TEE);  Surgeon: Wendall Stade, MD;  Location: [facility] ENDOSCOPY;  Service: Cardiovascular;  Laterality: N/A;     reports that he has never smoked. He has never used smokeless tobacco. He reports current alcohol use. He reports current drug use. Drug: Marijuana.  Allergies  Allergen Reactions  . Ibuprofen     Told not to take ibuprofen after cardiac surgery  . Nsaids Other (See Comments)    Unknown reaction    Family History  Problem Relation Age of Onset  . Diabetes Mellitus I Sister  Prior to Admission medications   Medication Sig Start Date End Date Taking? Authorizing Provider  acetaminophen (TYLENOL) 500 MG tablet Take 500 mg by mouth every 6 (six) hours as needed for mild pain.    [provider]  amLODipine (NORVASC) 5 MG tablet Take 5 mg by mouth daily.  10/01/20 10/01/21  [provider]  ASPIRIN LOW DOSE 81 MG EC tablet Take 81 mg by mouth daily. 09/02/20   [provider]  doxazosin (CARDURA) 2 MG tablet Take 1 mg by mouth at bedtime. 10/31/20 10/31/21  [provider]  Glucagon (BAQSIMI ONE PACK) 3 MG/DOSE POWD Place 1 spray into the nose as needed (low blood sugar).    [provider]  insulin degludec (TRESIBA FLEXTOUCH) 100 UNIT/ML FlexTouch Pen Inject 14 Units into the skin at bedtime.  08/04/20   [provider]  insulin glulisine (APIDRA SOLOSTAR) 100 UNIT/ML Solostar Pen Inject 0-45 Units into the skin See admin instructions. total daily dose up to 45 units  with meals and snacks 08/04/20   [provider]  Insulin Syringe-Needle U-100 (INSULIN SYRINGE .3CC/31GX5/16") 31G X 5/16" 0.3 ML MISC 1 Act by Does not apply route 4 (four) times daily -  before meals and at bedtime. 01/09/18   Rolly Salter, MD  metoCLOPramide (REGLAN) 5 MG tablet Take 1 tablet (5 mg total) by mouth every 6 (six) hours as needed for up to 7 days for nausea. 12/07/20 12/14/20  Lorin Glass, MD  mycophenolate (MYFORTIC) 360 MG TBEC EC tablet Take 720 mg by mouth 2 (two) times daily.    [provider]  pantoprazole (PROTONIX) 40 MG tablet 40 mg daily.  09/01/20 09/01/21  [provider]  polyethylene glycol powder (GLYCOLAX/MIRALAX) 17 GM/SCOOP powder Take 17 g by mouth daily as needed for constipation. 10/18/20   [provider]  pravastatin (PRAVACHOL) 40 MG tablet Take 40 mg by mouth daily. 09/02/20   [provider]  predniSONE (DELTASONE) 5 MG tablet Take 5 mg by mouth daily with breakfast.  08/04/20   [provider]  sertraline (ZOLOFT) 50 MG tablet Take 50 mg by mouth daily.    [provider]  sildenafil (VIAGRA) 25 MG tablet Take by mouth. 09/14/20 12/03/20  [provider]   sulfamethoxazole-trimethoprim (BACTRIM) 400-80 MG tablet Take 1 tablet by mouth daily.    [provider]  Tacrolimus ER 1 MG TB24 Take 6 mg by mouth daily before breakfast. 10/31/20 10/31/21  [provider]  VIMPAT 50 MG TABS tablet Take 50 mg by mouth 2 (two) times daily. 08/04/20   [provider]    Physical Exam:  Constitutional: Young male who appears acutely ill Vitals:   02/18/21 2355 02/19/21 0031 02/19/21 0233 02/19/21 0531  BP: (!) 174/91  (!) 160/84 130/75  Pulse: (!) 127  (!) 120 (!) 110  Resp: (!) 26  18 16   Temp: 99.4 F (37.4 C)  98.6 F (37 C) 98.4 F (36.9 C)  TempSrc: Oral  Oral Oral  SpO2: 100%  100% 98%  Weight:  47.4 kg    Height:  5\' 7"  (1.702 m)     Eyes: PERRL, lids and conjunctivae normal ENMT: Mucous membranes are dry with ketone odor.  Posterior pharynx clear of any exudate or lesions. Normal dentition.  Neck: normal, supple, no masses, no thyromegaly Respiratory: clear to auscultation bilaterally, no wheezing, no crackles. Normal respiratory effort. No accessory muscle use.  Cardiovascular: Tachycardic, no murmurs / rubs /  gallops. No extremity edema. 2+ pedal pulses. No carotid bruits.  Abdomen: Generalized tenderness to palpation. Musculoskeletal: no clubbing / cyanosis. No joint deformity upper and lower extremities. Good ROM, no contractures. Normal muscle tone.  Skin: no rashes, lesions, ulcers. No induration Neurologic: CN 2-12 grossly intact. Sensation intact, DTR normal. Strength 5/5 in all 4.  Psychiatric: Normal judgment and insight. Alert and oriented x 3. Normal mood.     Labs on Admission: I have personally reviewed following labs and imaging studies  CBC: Recent Labs  Lab 02/19/21 0133 02/19/21 0148  WBC 6.5  --   NEUTROABS 5.8  --   HGB 11.5* 11.9*  HCT 34.3* 35.0*  MCV 73.1*  --   PLT 411*  --    Basic Metabolic Panel: Recent Labs  Lab 02/19/21 0133 02/19/21 0148  NA 137 137  K 5.4* 5.7*   CL 100  --   CO2 18*  --   GLUCOSE 598*  --   BUN 28*  --   CREATININE 2.38*  --   CALCIUM 9.2  --   MG 1.7  --    GFR: Estimated Creatinine Clearance: 32.4 mL/min (A) (by C-G formula based on SCr of 2.38 mg/dL (H)). Liver Function Tests: Recent Labs  Lab 02/19/21 0133  AST 24  ALT 24  ALKPHOS 76  BILITOT 1.1  PROT 7.3  ALBUMIN 4.1   Recent Labs  Lab 02/19/21 0133  LIPASE 83*   No results for input(s): AMMONIA in the last 168 hours. Coagulation Profile: No results for input(s): INR, PROTIME in the last 168 hours. Cardiac Enzymes: No results for input(s): CKTOTAL, CKMB, CKMBINDEX, TROPONINI in the last 168 hours. BNP (last 3 results) No results for input(s): PROBNP in the last 8760 hours. HbA1C: No results for input(s): HGBA1C in the last 72 hours. CBG: Recent Labs  Lab 02/18/21 2352  GLUCAP 580*   Lipid Profile: No results for input(s): CHOL, HDL, LDLCALC, TRIG, CHOLHDL, LDLDIRECT in the last 72 hours. Thyroid Function Tests: No results for input(s): TSH, T4TOTAL, FREET4, T3FREE, THYROIDAB in the last 72 hours. Anemia Panel: No results for input(s): VITAMINB12, FOLATE, FERRITIN, TIBC, IRON, RETICCTPCT in the last 72 hours. Urine analysis:    Component Value Date/Time   COLORURINE STRAW (A) 02/19/2021 0002   APPEARANCEUR CLEAR 02/19/2021 0002   LABSPEC 1.015 02/19/2021 0002   PHURINE 6.0 02/19/2021 0002   GLUCOSEU >=500 (A) 02/19/2021 0002   HGBUR NEGATIVE 02/19/2021 0002   BILIRUBINUR NEGATIVE 02/19/2021 0002   KETONESUR 80 (A) 02/19/2021 0002   PROTEINUR 30 (A) 02/19/2021 0002   NITRITE NEGATIVE 02/19/2021 0002   LEUKOCYTESUR NEGATIVE 02/19/2021 0002   Sepsis Labs: No results found for this or any previous visit (from the past 240 hour(s)).   Radiological Exams on Admission: DG Chest 2 View  Result Date: 02/19/2021 CLINICAL DATA:  Chest pain EXAM: CHEST - 2 VIEW COMPARISON:  12/02/2020 FINDINGS: The heart size and mediastinal contours are within  normal limits. Both lungs are clear. The visualized skeletal structures are unremarkable. Postsurgical changes are noted. IMPRESSION: No active cardiopulmonary disease. Electronically Signed   By: Alcide CleverMark  Lukens M.D.   On: 02/19/2021 00:49    EKG: Independently reviewed.  Sinus tachycardia 124 bpm with QTc 583  Assessment/Plan Suspected diabetic ketoacidosis, type I: Acute.  Patient presented with complaints of nausea and vomiting.  On admission glucose 598, CO2 18, and anion gap 19.  Venous pH was obtained was 7.362.  Urinalysis was positive for ketones  and glucose no signs of infection.  Patient symptoms appear more along with diabetic ketoacidosis as opposed to HHS.  He has been given IV fluids and started on insulin drip.  His last hemoglobin A1c who previously was 9.7 in January. -Admit to progressive bed -Hyperglycemic crisis order set last -Serial BMPs, hemoglobin A1c in a.m.  -Correct electrolytes as needed -Monitoring for AG closure and will transition to subcutaneous insulin once able -Diabetic education consulted  Hematemesis with nausea  history of gastroparesis: Acute. Patient presents with a 2-day history of nausea and vomiting.  He admits symptoms feel similar to episodes of gastroparesis.  While in the ED patient noted to have vomited up dark blood with clots.  Hemoglobin was initially noted to be 11.5 g/dL patient was otherwise hemodynamically stable -Aspiration precautions -Elevate head of bed -N.p.o. -Avoided Protonix due to prolonged QT interval -Tigan IM as needed for nausea and vomiting given prolonged QT interval -Reassess for symptom improvement may consider giving additional doses -Dr. Loreta Ave of GI formally consulted, follow-up for further recommendation  SIRS: Acute.  On admission patient was noted to be tachycardic and tachypneic meeting SIRS criteria.  Patient was otherwise noted to be afebrile and white blood cell count within normal limits.  Suspect symptoms are  secondary to DKA, but patient noted to be on chronic immunosuppression therapy. -Check blood cultures  Acute kidney injury secondary to dehydration: Patient presents with creatinine elevated up to 2.38 with BUN 28.  In the setting of DKA suspect prerenal cause of symptoms.  Patient has been given 2 L of normal saline IV fluids -Strict intake and output -IV fluids as seen above -Continue to monitor kidney function  Hypertensive urgency: Acute.  Patient with blood pressures up to 193/91 on admission.  Medications include amlodipine 10 mg daily and doxazosin 1 mg nightly. -Hydralazine IV as needed for systolic blood pressure greater than 180. -Restart home oral medications when able  Microcytic hypochromic anemia: Hemoglobin 11.5 with low MCV and MCH.  Which appears slightly higher than previous, but suspect that secondary to hemoconcentration.  However, patient was noted to have a episode of hematemesis with clots while in the ED. -Type and screen for possible need of blood products  -Serial monitoring of H&H  S/p Cardiac transplant on chronic immunosuppressive therapy: Status post cardiac transplant in May 2021 with acute rejection.  Patient followed at [facility]. -Continue tacrolimus and prednisone hopefully tomorrow morning  Prolonged QT interval: QTC on admission 583. -Correct electrolyte abnormalities -Hold QT prolonging medications -Recheck QTC in a.m.  Elevated lipase: Acute.  Lipase elevated at 83 on admission.  Secondary to DKA. -May warrant CT scan of the abdomen if symptoms persist  Seizure disorder: Patient denies any reports of any recent seizure activity.  -Seizure precautions -Restart home seizure medications once able  -IV Ativan as needed seizure  History of CVA -Continue statin when able  Thrombocytosis: Acute.  Platelet count 411 on admission.  Suspect this is reactive in nature. -Continue to monitor DVT prophylaxis: SCDs Code Status: Full Family Communication:  Grandmother updated over the phone Disposition Plan: Likely home once medically stable Consults called: GI Admission status: Inpatient, require more than 2 midnight stay due to AKI  Clydie Braun MD [facility]   If 7PM-7AM, please contact night-coverage   02/19/2021, 7:14 AM

## 2021-02-19 NOTE — ED Triage Notes (Signed)
The pt arrived by [facility] from home he had a heaRT TRANSPLANT recently he had surgery at [facility] nausea and vomiting  Since yesterday  [facility] wanted to go to [facility] [place]  Ems gave iv and zofran 4mg  without  relief

## 2021-02-19 NOTE — Anesthesia Postprocedure Evaluation (Signed)
Anesthesia Post Note  Patient: Manuel Baker  Procedure(s) Performed: ESOPHAGOGASTRODUODENOSCOPY (EGD) (N/A ) ESOPHAGEAL BRUSHING     Patient location during evaluation: PACU Anesthesia Type: General Level of consciousness: awake and alert Pain management: pain level controlled Vital Signs Assessment: post-procedure vital signs reviewed and stable Respiratory status: spontaneous breathing, nonlabored ventilation, respiratory function stable and patient connected to nasal cannula oxygen Cardiovascular status: blood pressure returned to baseline and stable Postop Assessment: no apparent nausea or vomiting Anesthetic complications: no   No complications documented.  Last Vitals:  Vitals:   02/19/21 1505 02/19/21 1515  BP: 127/70 (!) 156/87  Pulse: (!) 109 (!) 109  Resp: (!) 23 17  Temp:    SpO2: 97% 99%    Last Pain:  Vitals:   02/19/21 1515  TempSrc:   PainSc: 0-No pain                 Kennieth Rad

## 2021-02-19 NOTE — Consult Note (Signed)
UNASSIGNED PATIENT Reason for Consult: Hematemesis with coffee-ground emesis in the setting of severe nausea and vomiting. Referring Physician: [facility] hospitalist.  Manuel Baker is an 25 y.o. male.  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 fib and arrest currently on immunosuppressive therapy.  Patient gives a 2-day history of nausea and vomiting, followed by some coffee-ground emesis and then a few clots this morning.  He has had some epigastric pain he takes a baby aspirin every day but denies use of any OTC nonsteroidals.  There is no history of melena hematochezia.  On route to the hospital he was found to have a glucose of over 600 by EMS and has been started on insulin drip; BUN is 28 with a creatinine of 2.38 pH of 7.362.  Patient gets most of his care at [facility] but when the case was discussed with the [facility] physicians for transfer the declined to take the patient.  Patient claims he has had symptoms like this before with vomited blood after several bouts of nausea and vomiting.  Past Medical History:  Diagnosis Date  . Congenital heart disease   . Diabetes mellitus without complication (HCC)   . Seizures (HCC)    Past Surgical History:  Procedure Laterality Date  . open heart surgery    . TEE WITHOUT CARDIOVERSION N/A 01/06/2018   Procedure: TRANSESOPHAGEAL ECHOCARDIOGRAM (TEE);  Surgeon: Wendall Stade, MD;  Location: [facility] ENDOSCOPY;  Service: Cardiovascular;  Laterality: N/A;   Family History  Problem Relation Age of Onset  . Diabetes Mellitus I Sister    Social History:  reports that he has never smoked. He has never used smokeless tobacco. He reports current alcohol use. He reports current drug use.  Drug: Marijuana.  Allergies:  Allergies  Allergen Reactions  . Ibuprofen     Told not to take ibuprofen after cardiac surgery  . Nsaids Other (See Comments)    Unknown reaction   Medications: I have reviewed the patient's current medications.  Results for orders placed or performed during the hospital encounter of 02/18/21 (from the past 48 hour(s))  CBG monitoring, ED     Status: Abnormal   Collection Time: 02/18/21 11:52 PM  Result Value Ref Range   Glucose-Capillary 580 (HH) 70 - 99 mg/dL    Comment: Glucose reference range applies only to samples taken after fasting for at least 8 hours.   Comment 1 Notify RN   Urinalysis, Routine w reflex microscopic Urine, Clean Catch     Status: Abnormal   Collection Time: 02/19/21 12:02 AM  Result Value Ref Range   Color, Urine STRAW (A) YELLOW   APPearance CLEAR CLEAR   Specific Gravity, Urine 1.015 1.005 - 1.030   pH 6.0 5.0 - 8.0   Glucose, UA >=500 (A) NEGATIVE mg/dL   Hgb urine dipstick NEGATIVE NEGATIVE   Bilirubin Urine NEGATIVE NEGATIVE   Ketones, ur 80 (A) NEGATIVE mg/dL   Protein, ur 30 (A) NEGATIVE mg/dL   Nitrite NEGATIVE NEGATIVE   Leukocytes,Ua NEGATIVE NEGATIVE   RBC / HPF 0-5 0 - 5 RBC/hpf   WBC, UA 0-5 0 - 5 WBC/hpf   Bacteria, UA NONE SEEN NONE SEEN    Comment: Performed at [facility], [address], [place],  [place] [id number]  Comprehensive metabolic panel     Status: Abnormal   Collection Time: 02/19/21  1:33 AM  Result Value Ref Range   Sodium 137 135 - 145 mmol/L   Potassium 5.4 (H) 3.5 - 5.1 mmol/L   Chloride 100 98 - 111 mmol/L   CO2 18 (L) 22 - 32 mmol/L   Glucose, Bld 598 (HH) 70 - 99 mg/dL    Comment: Glucose reference range applies only to samples taken after fasting for at least 8 hours. CRITICAL RESULT CALLED TO, READ BACK BY AND VERIFIED WITH: FERRAINOLO J,RN  03/28//22 0259 WAYK    BUN 28 (H) 6 - 20 mg/dL   Creatinine, Ser 2.132.38 (H) 0.61 - 1.24 mg/dL   Calcium 9.2 8.9 - 08.610.3 mg/dL    Total Protein 7.3 6.5 - 8.1 g/dL   Albumin 4.1 3.5 - 5.0 g/dL   AST 24 15 - 41 U/L   ALT 24 0 - 44 U/L   Alkaline Phosphatase 76 38 - 126 U/L   Total Bilirubin 1.1 0.3 - 1.2 mg/dL   GFR, Estimated 38 (L) >60 mL/min    Comment: (NOTE) Calculated using the CKD-EPI Creatinine Equation (2021)    Anion gap 19 (H) 5 - 15    Comment: Performed at [facility], [address] [place], [place] [id number]  CBC with Differential     Status: Abnormal   Collection Time: 02/19/21  1:33 AM  Result Value Ref Range   WBC 6.5 4.0 - 10.5 K/uL   RBC 4.69 4.22 - 5.81 MIL/uL   Hemoglobin 11.5 (L) 13.0 - 17.0 g/dL   HCT 96.234.3 (L) 95.239.0 - 84.152.0 %   MCV 73.1 (L) 80.0 - 100.0 fL   MCH 24.5 (L) 26.0 - 34.0 pg   MCHC 33.5 30.0 - 36.0 g/dL   RDW 32.418.4 (H) 40.111.5 - 02.715.5 %   Platelets 411 (H) 150 - 400 K/uL   nRBC 0.0 0.0 - 0.2 %   Neutrophils Relative % 90 %   Neutro Abs 5.8 1.7 - 7.7 K/uL   Lymphocytes Relative 8 %   Lymphs Abs 0.5 (L) 0.7 - 4.0 K/uL   Monocytes Relative 1 %   Monocytes Absolute 0.1 0.1 - 1.0 K/uL   Eosinophils Relative 0 %   Eosinophils Absolute 0.0 0.0 - 0.5 K/uL   Basophils Relative 0 %   Basophils Absolute 0.0 0.0 - 0.1 K/uL   Immature Granulocytes 1 %   Abs Immature Granulocytes 0.03 0.00 - 0.07 K/uL    Comment: Performed at [facility], [address] [place], [place] [id number]  Lipase, blood     Status: Abnormal   Collection Time: 02/19/21  1:33 AM  Result Value Ref Range   Lipase 83 (H) 11 - 51 U/L    Comment: Performed at [facility], [address] [place], [place] [address]  Troponin I (High Sensitivity)     Status: None   Collection Time: 02/19/21  1:33 AM  Result Value Ref Range   Troponin I (High Sensitivity) 6 <18 ng/L    Comment: (NOTE) Elevated high sensitivity troponin I (hsTnI) values and significant  changes across serial measurements may suggest ACS but many other  chronic and acute conditions are known to elevate hsTnI results.  Refer to  the Links section for chest pain algorithms and additional  guidance. Performed at [facility], [address] [place], [place] [address]   Magnesium  Status: None   Collection Time: 02/19/21  1:33 AM  Result Value Ref Range   Magnesium 1.7 1.7 - 2.4 mg/dL    Comment: Performed at [facility], [address], [place], [place] [address]  I-Stat venous blood gas, ED     Status: Abnormal   Collection Time: 02/19/21  1:48 AM  Result Value Ref Range   pH, Ven 7.362 7.250 - 7.430   pCO2, Ven 33.6 (L) 44.0 - 60.0 mmHg   pO2, Ven 138.0 (H) 32.0 - 45.0 mmHg   Bicarbonate 19.0 (L) 20.0 - 28.0 mmol/L   TCO2 20 (L) 22 - 32 mmol/L   O2 Saturation 99.0 %   Acid-base deficit 6.0 (H) 0.0 - 2.0 mmol/L   Sodium 137 135 - 145 mmol/L   Potassium 5.7 (H) 3.5 - 5.1 mmol/L   Calcium, Ion 1.12 (L) 1.15 - 1.40 mmol/L   HCT 35.0 (L) 39.0 - 52.0 %   Hemoglobin 11.9 (L) 13.0 - 17.0 g/dL   Sample type VENOUS   Resp Panel by RT-PCR (Flu A&B, Covid) Nasopharyngeal Swab     Status: None   Collection Time: 02/19/21  4:23 AM   Specimen: Nasopharyngeal Swab; Nasopharyngeal(NP) swabs in vial transport medium  Result Value Ref Range   SARS Coronavirus 2 by RT PCR NEGATIVE NEGATIVE    Comment: (NOTE) SARS-CoV-2 target nucleic acids are NOT DETECTED.  The SARS-CoV-2 RNA is generally detectable in upper respiratory specimens during the acute phase of infection. The lowest concentration of SARS-CoV-2 viral copies this assay can detect is 138 copies/mL. A negative result does not preclude SARS-Cov-2 infection and should not be used as the sole basis for treatment or other patient management decisions. A negative result may occur with  improper specimen collection/handling, submission of specimen other than nasopharyngeal swab, presence of viral mutation(s) within the areas targeted by this assay, and inadequate number of viral copies(<138 copies/mL). A negative result must be combined  with clinical observations, patient history, and epidemiological information. The expected result is Negative.  Fact Sheet for Patients:  BloggerCourse.com  Fact Sheet for Healthcare Providers:  SeriousBroker.it  This test is no t yet approved or cleared by the [place] FDA and  has been authorized for detection and/or diagnosis of SARS-CoV-2 by FDA under an Emergency Use Authorization (EUA). This EUA will remain  in effect (meaning this test can be used) for the duration of the COVID-19 declaration under Section 564(b)(1) of the Act, 21 U.S.C.section 360bbb-3(b)(1), unless the authorization is terminated  or revoked sooner.       Influenza A by PCR NEGATIVE NEGATIVE   Influenza B by PCR NEGATIVE NEGATIVE    Comment: (NOTE) The Xpert Xpress SARS-CoV-2/FLU/RSV plus assay is intended as an aid in the diagnosis of influenza from Nasopharyngeal swab specimens and should not be used as a sole basis for treatment. Nasal washings and aspirates are unacceptable for Xpert Xpress SARS-CoV-2/FLU/RSV testing.  Fact Sheet for Patients: BloggerCourse.com  Fact Sheet for Healthcare Providers: SeriousBroker.it  This test is not yet approved or cleared by the [place] FDA and has been authorized for detection and/or diagnosis of SARS-CoV-2 by FDA under an Emergency Use Authorization (EUA). This EUA will remain in effect (meaning this test can be used) for the duration of the COVID-19 declaration under Section 564(b)(1) of the Act, 21 U.S.C. section 360bbb-3(b)(1), unless the authorization is terminated or revoked.  Performed at [facility], [address], [place], [place] [address]  CBG monitoring, ED     Status: Abnormal   Collection Time: 02/19/21  7:58 AM  Result Value Ref Range   Glucose-Capillary 506 (HH) 70 - 99 mg/dL    Comment: Glucose reference range applies  only to samples taken after fasting for at least 8 hours.   Comment 1 Notify RN    Comment 2 Document in Chart   Beta-hydroxybutyric acid     Status: Abnormal   Collection Time: 02/19/21  8:45 AM  Result Value Ref Range   Beta-Hydroxybutyric Acid 6.53 (H) 0.05 - 0.27 mmol/L    Comment: RESULTS CONFIRMED BY MANUAL DILUTION Performed at [facility], [address] [place], [place] [address]   Basic metabolic panel     Status: Abnormal   Collection Time: 02/19/21  8:45 AM  Result Value Ref Range   Sodium 140 135 - 145 mmol/L   Potassium 4.8 3.5 - 5.1 mmol/L   Chloride 105 98 - 111 mmol/L   CO2 15 (L) 22 - 32 mmol/L   Glucose, Bld 520 (HH) 70 - 99 mg/dL    Comment: Glucose reference range applies only to samples taken after fasting for at least 8 hours. CRITICAL RESULT CALLED TO, READ BACK BY AND VERIFIED WITH: KOHUT,N RN  ON [id number] BY FLEMINGS    BUN 31 (H) 6 - 20 mg/dL   Creatinine, Ser 4.78 (H) 0.61 - 1.24 mg/dL   Calcium 8.9 8.9 - 29.5 mg/dL   GFR, Estimated 37 (L) >60 mL/min    Comment: (NOTE) Calculated using the CKD-EPI Creatinine Equation (2021)    Anion gap 20 (H) 5 - 15    Comment: Performed at [facility], [address] [place], [place] [address]  Hemoglobin A1c     Status: Abnormal   Collection Time: 02/19/21  8:45 AM  Result Value Ref Range   Hgb A1c MFr Bld 9.3 (H) 4.8 - 5.6 %    Comment: (NOTE) Pre diabetes:          5.7%-6.4%  Diabetes:              >6.4%  Glycemic control for   <7.0% adults with diabetes    Mean Plasma Glucose 220.21 mg/dL    Comment: Performed at [facility], [address], [place], [place] [address]  CBG monitoring, ED     Status: Abnormal   Collection Time: 02/19/21  9:02 AM  Result Value Ref Range   Glucose-Capillary 435 (H) 70 - 99 mg/dL    Comment: Glucose reference range applies only to samples taken after fasting for at least 8 hours.  CBG monitoring, ED     Status: Abnormal   Collection Time:  02/19/21  9:50 AM  Result Value Ref Range   Glucose-Capillary 380 (H) 70 - 99 mg/dL    Comment: Glucose reference range applies only to samples taken after fasting for at least 8 hours.  Type and screen [facility]     Status: None   Collection Time: 02/19/21 10:57 AM  Result Value Ref Range   ABO/RH(D) O NEG    Antibody Screen NEG    Sample Expiration      02/22/2021,2359 Performed at [facility], [address] [place], [place] [address]   Beta-hydroxybutyric acid     Status: Abnormal   Collection Time: 02/19/21 11:02 AM  Result Value Ref Range   Beta-Hydroxybutyric Acid 2.83 (H) 0.05 - 0.27 mmol/L    Comment:  Performed at [facility], [address] [place], [place] [address]  CBG monitoring, ED     Status: Abnormal   Collection Time: 02/19/21 11:03 AM  Result Value Ref Range   Glucose-Capillary 285 (H) 70 - 99 mg/dL    Comment: Glucose reference range applies only to samples taken after fasting for at least 8 hours.  Hemoglobin and hematocrit, blood     Status: Abnormal   Collection Time: 02/19/21 11:09 AM  Result Value Ref Range   Hemoglobin 9.8 (L) 13.0 - 17.0 g/dL   HCT 95.2 (L) 84.1 - 32.4 %    Comment: Performed at [facility], [address], [place], [place] [address]  Basic metabolic panel     Status: Abnormal   Collection Time: 02/19/21 11:20 AM  Result Value Ref Range   Sodium 142 135 - 145 mmol/L   Potassium 4.5 3.5 - 5.1 mmol/L   Chloride 107 98 - 111 mmol/L   CO2 23 22 - 32 mmol/L   Glucose, Bld 328 (H) 70 - 99 mg/dL    Comment: Glucose reference range applies only to samples taken after fasting for at least 8 hours.   BUN 29 (H) 6 - 20 mg/dL   Creatinine, Ser 7.25 (H) 0.61 - 1.24 mg/dL   Calcium 8.7 (L) 8.9 - 10.3 mg/dL   GFR, Estimated 38 (L) >60 mL/min    Comment: (NOTE) Calculated using the CKD-EPI Creatinine Equation (2021)    Anion gap 12 5 - 15    Comment: Performed at [facility], [address] [place], [place] [address]  CBG monitoring, ED     Status: Abnormal   Collection Time: 02/19/21 12:26 PM  Result Value Ref Range   Glucose-Capillary 232 (H) 70 - 99 mg/dL    Comment: Glucose reference range applies only to samples taken after fasting for at least 8 hours.   DG Chest 2 View  Result Date: 02/19/2021 CLINICAL DATA:  Chest pain EXAM: CHEST - 2 VIEW COMPARISON:  12/02/2020 FINDINGS: The heart size and mediastinal contours are within normal limits. Both lungs are clear. The visualized skeletal structures are unremarkable. Postsurgical changes are noted. IMPRESSION: No active cardiopulmonary disease. Electronically Signed   By: Alcide Clever M.D.   On: 02/19/2021 00:49   Review of Systems  Constitutional: Positive for activity change, diaphoresis and fatigue. Negative for appetite change, chills, fever and unexpected weight change.  HENT: Negative.   Eyes: Negative.   Respiratory: Positive for chest tightness and shortness of breath. Negative for apnea, cough, choking, wheezing and stridor.   Cardiovascular: Positive for palpitations. Negative for chest pain and leg swelling.  Gastrointestinal: Positive for abdominal pain. Negative for abdominal distention, anal bleeding, blood in stool, constipation, diarrhea, nausea, rectal pain and vomiting.  Endocrine: Negative.   Genitourinary: Negative.   Musculoskeletal: Negative.   Allergic/Immunologic: Negative.   Neurological: Positive for dizziness, seizures, weakness and light-headedness. Negative for tremors, syncope, facial asymmetry, speech difficulty, numbness and headaches.  Hematological: Negative.   Psychiatric/Behavioral: Positive for confusion. Negative for agitation, behavioral problems, decreased concentration, dysphoric mood, hallucinations, self-injury and sleep disturbance. The patient is nervous/anxious. The patient is not hyperactive.    Blood pressure (!) 157/93, pulse (!) 115, temperature 98.4 F (36.9 C), temperature  source Oral, resp. rate (!) 23, height  (1.702 m), weight 47.4 kg, SpO2 97 %. Physical Exam Constitutional:      Appearance: He is toxic-appearing.  HENT:  Head: Normocephalic and atraumatic.     Mouth/Throat:     Mouth: Mucous membranes are dry.  Eyes:     Extraocular Movements: Extraocular movements intact.     Pupils: Pupils are equal, round, and reactive to light.  Cardiovascular:     Rate and Rhythm: Tachycardia present.  Chest:     Comments: Large midline scar present on the chest from previous surgery Abdominal:     Palpations: Abdomen is soft.  Musculoskeletal:     Cervical back: Normal range of motion and neck supple.  Skin:    General: Skin is warm and dry.   Assessment/Plan: 1) Ground emesis/hematemesis with severe nausea and vomiting in the setting of gastroparesis and diabetic ketoacidosis-proceed with an EGD today-rule out Mallory-Weiss tear. 2) Microcytic anemia 3) Diabetic ketoacidosis on insulin drip. 4) AKI. 5) Hypertension. 6) S/P cardiac transplant on chronic immunosuppressive therapy. 5) History of seizure disorder/CVA.  Charna Elizabeth 02/19/2021, 1:10 PM

## 2021-02-19 NOTE — Transfer of Care (Signed)
Immediate Anesthesia Transfer of Care Note  Patient: Manuel Baker  Procedure(s) Performed: ESOPHAGOGASTRODUODENOSCOPY (EGD) (N/A ) ESOPHAGEAL BRUSHING  Patient Location: Endoscopy Unit  Anesthesia Type:General  Level of Consciousness: drowsy and patient cooperative  Airway & Oxygen Therapy: Patient Spontanous Breathing  Post-op Assessment: Report given to RN, Post -op Vital signs reviewed and stable and Patient moving all extremities X 4  Post vital signs: Reviewed and stable  Last Vitals:  Vitals Value Taken Time  BP 133/70 02/19/21 1455  Temp 37.2 C 02/19/21 1455  Pulse 110 02/19/21 1502  Resp 27 02/19/21 1502  SpO2 96 % 02/19/21 1502  Vitals shown include unvalidated device data.  Last Pain:  Vitals:   02/19/21 1455  TempSrc: Oral  PainSc: 0-No pain         Complications: No complications documented.

## 2021-02-20 ENCOUNTER — Other Ambulatory Visit: Payer: Self-pay

## 2021-02-20 ENCOUNTER — Encounter (HOSPITAL_COMMUNITY): Payer: Self-pay | Admitting: Gastroenterology

## 2021-02-20 LAB — BASIC METABOLIC PANEL
Anion gap: 10 (ref 5–15)
Anion gap: 14 (ref 5–15)
Anion gap: 6 (ref 5–15)
BUN: 10 mg/dL (ref 6–20)
BUN: 16 mg/dL (ref 6–20)
BUN: 8 mg/dL (ref 6–20)
CO2: 21 mmol/L — ABNORMAL LOW (ref 22–32)
CO2: 24 mmol/L (ref 22–32)
CO2: 28 mmol/L (ref 22–32)
Calcium: 8.9 mg/dL (ref 8.9–10.3)
Calcium: 8.9 mg/dL (ref 8.9–10.3)
Calcium: 9.2 mg/dL (ref 8.9–10.3)
Chloride: 101 mmol/L (ref 98–111)
Chloride: 107 mmol/L (ref 98–111)
Chloride: 107 mmol/L (ref 98–111)
Creatinine, Ser: 1.27 mg/dL — ABNORMAL HIGH (ref 0.61–1.24)
Creatinine, Ser: 1.4 mg/dL — ABNORMAL HIGH (ref 0.61–1.24)
Creatinine, Ser: 1.62 mg/dL — ABNORMAL HIGH (ref 0.61–1.24)
GFR, Estimated: >60 mL/min (ref >60)
GFR, Estimated: >60 mL/min (ref >60)
GFR, Estimated: >60 mL/min (ref >60)
Glucose, Bld: 145 mg/dL — ABNORMAL HIGH (ref 70–99)
Glucose, Bld: 211 mg/dL — ABNORMAL HIGH (ref 70–99)
Glucose, Bld: 311 mg/dL — ABNORMAL HIGH (ref 70–99)
Potassium: 3.6 mmol/L (ref 3.5–5.1)
Potassium: 3.7 mmol/L (ref 3.5–5.1)
Potassium: 3.8 mmol/L (ref 3.5–5.1)
Sodium: 136 mmol/L (ref 135–145)
Sodium: 141 mmol/L (ref 135–145)
Sodium: 141 mmol/L (ref 135–145)

## 2021-02-20 LAB — CBG MONITORING, ED
Glucose-Capillary: 190 mg/dL — ABNORMAL HIGH (ref 70–99)
Glucose-Capillary: 210 mg/dL — ABNORMAL HIGH (ref 70–99)
Glucose-Capillary: 190 mg/dL — ABNORMAL HIGH (ref 70–99)
Glucose-Capillary: 163 mg/dL — ABNORMAL HIGH (ref 70–99)
Glucose-Capillary: 150 mg/dL — ABNORMAL HIGH (ref 70–99)
Glucose-Capillary: 153 mg/dL — ABNORMAL HIGH (ref 70–99)
Glucose-Capillary: 203 mg/dL — ABNORMAL HIGH (ref 70–99)

## 2021-02-20 LAB — GLUCOSE, CAPILLARY
Glucose-Capillary: 341 mg/dL — ABNORMAL HIGH (ref 70–99)
Glucose-Capillary: 219 mg/dL — ABNORMAL HIGH (ref 70–99)
Glucose-Capillary: 170 mg/dL — ABNORMAL HIGH (ref 70–99)
Glucose-Capillary: 125 mg/dL — ABNORMAL HIGH (ref 70–99)

## 2021-02-20 LAB — CYTOLOGY - NON PAP

## 2021-02-20 LAB — MRSA PCR SCREENING: MRSA by PCR: NEGATIVE

## 2021-02-20 LAB — ABO/RH: ABO/RH(D): O NEG

## 2021-02-20 MED ORDER — INSULIN ASPART 100 UNIT/ML ~~LOC~~ SOLN: 6.0000 [IU] | Freq: Three times a day (TID) | SUBCUTANEOUS | Status: DC

## 2021-02-20 MED ORDER — INSULIN GLARGINE 100 UNIT/ML ~~LOC~~ SOLN
10.0000 [IU] | Freq: Every day | SUBCUTANEOUS | Status: DC
  Administered 2021-02-20: 10 [IU] via SUBCUTANEOUS
  Filled 2021-02-20 (×2): qty 0.1

## 2021-02-20 MED ORDER — INSULIN GLARGINE 100 UNIT/ML ~~LOC~~ SOLN
20.0000 [IU] | Freq: Every day | SUBCUTANEOUS | Status: DC
  Administered 2021-02-20: 20 [IU] via SUBCUTANEOUS
  Filled 2021-02-20 (×2): qty 0.2

## 2021-02-20 MED ORDER — INSULIN ASPART 100 UNIT/ML ~~LOC~~ SOLN: 4.0000 [IU] | Freq: Three times a day (TID) | SUBCUTANEOUS | Status: DC

## 2021-02-20 MED ORDER — INSULIN GLARGINE 100 UNIT/ML ~~LOC~~ SOLN
14.0000 [IU] | Freq: Every day | SUBCUTANEOUS | Status: DC
  Filled 2021-02-20 (×3): qty 0.14

## 2021-02-20 MED ORDER — MELATONIN 3 MG PO TABS: 3.0000 mg | ORAL_TABLET | Freq: Every evening | ORAL | Status: DC | PRN

## 2021-02-20 MED ORDER — SODIUM CHLORIDE 0.9 % IV SOLN
12.5000 mg | Freq: Four times a day (QID) | INTRAVENOUS | Status: DC | PRN
  Administered 2021-02-20 – 2021-02-24 (×5): 12.5 mg via INTRAVENOUS
  Filled 2021-02-20 (×6): qty 0.5

## 2021-02-20 MED ORDER — LABETALOL HCL 5 MG/ML IV SOLN: 10.0000 mg | INTRAVENOUS | Status: DC | PRN

## 2021-02-20 MED ORDER — INSULIN ASPART 100 UNIT/ML ~~LOC~~ SOLN
0.0000 [IU] | SUBCUTANEOUS | Status: DC
  Administered 2021-02-20: 3 [IU] via SUBCUTANEOUS
  Administered 2021-02-20: 1 [IU] via SUBCUTANEOUS
  Administered 2021-02-20: 2 [IU] via SUBCUTANEOUS
  Administered 2021-02-20: 3 [IU] via SUBCUTANEOUS
  Administered 2021-02-20: 7 [IU] via SUBCUTANEOUS
  Administered 2021-02-20: 3 [IU] via SUBCUTANEOUS
  Administered 2021-02-20: 2 [IU] via SUBCUTANEOUS
  Administered 2021-02-21: 3 [IU] via SUBCUTANEOUS
  Administered 2021-02-21: 1 [IU] via SUBCUTANEOUS
  Administered 2021-02-21: 2 [IU] via SUBCUTANEOUS
  Administered 2021-02-21 – 2021-02-22 (×2): 3 [IU] via SUBCUTANEOUS
  Administered 2021-02-22: 5 [IU] via SUBCUTANEOUS
  Administered 2021-02-22: 2 [IU] via SUBCUTANEOUS
  Administered 2021-02-22: 1 [IU] via SUBCUTANEOUS
  Administered 2021-02-22: 5 [IU] via SUBCUTANEOUS
  Administered 2021-02-23: 1 [IU] via SUBCUTANEOUS
  Administered 2021-02-23 (×2): 2 [IU] via SUBCUTANEOUS
  Administered 2021-02-23: 1 [IU] via SUBCUTANEOUS
  Administered 2021-02-24: 2 [IU] via SUBCUTANEOUS

## 2021-02-20 MED ORDER — TACROLIMUS ER 4 MG PO TB24
10.0000 mg | ORAL_TABLET | Freq: Every day | ORAL | Status: DC
  Administered 2021-02-21: 10 mg via ORAL

## 2021-02-20 MED ORDER — LABETALOL HCL 5 MG/ML IV SOLN
10.0000 mg | Freq: Once | INTRAVENOUS | Status: AC
  Administered 2021-02-20: 10 mg via INTRAVENOUS
  Filled 2021-02-20: qty 4

## 2021-02-20 NOTE — Progress Notes (Signed)
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  Currently he is living with his roommates and studies at [employer].  He has long history of gastroparesis from diabetes.  When he presented his blood sugars was in the range of 600, he was in sinus tachycardia, hypertensive, mildly hyperkalemic, creatinine of 2.38.  Chest x-ray did not show any pneumonia.  Patient was initially tried to be transferred to [facility] but they declined due to no need of transfer.  In the emergency department, he had an episode of hematemesis.  GI consulted and he underwent EGD.  Patient was also started on insulin drip for DKA.  Brief Narrative:   Assessment & Plan:   Principal Problem:   DKA (diabetic ketoacidosis) (HCC) Active Problems:   Seizure disorder (HCC)   Diabetic gastroparesis (HCC)   Dehydration   Hematemesis with nausea   SIRS (systemic inflammatory response syndrome) (HCC)   AKI (acute kidney injury) (HCC)   Prolonged QT interval   Hematemesis/microcytic hypochromic anemia: Presented with 2-day history of nausea and vomiting.Takes aspirin at home.  In the emergency department, he vomited dark blood with clots.  Hemoglobin was stable in the range of 11. underwent EGD by GI, follow biopsies.  EGD had shown erythema in the gastric mucosa which was likely the source of coffee-ground emesis/hematemesis.  Continue Protonix.  Diet advance as tolerated.  On soft diet today.  Diabetic ketoacidosis, diabetes type 1,  gastroparesis: Presented with nausea/vomiting.  Glucose was severely elevated on presentation, CO2 of 18, elevated anion gap.  Urinalysis was positive for ketones.  Patient was started on insulin drip.  Hemoglobin A1c 9.3.  His gap is closed so he has been started on long-acting and sliding cell insulin.  Monitor blood sugars.  Diabetic coordinator consulted.  SIRS/persistent sinus tachycardia: On presentation, he was tachycardic.  Patient is afebrile without any leukocytosis.  Symptoms were thought to be secondary to DKA.  Still in sinus tachycardia today.  Monitor on telemetry. Patient states that at his baseline, he remains tachycardic.  If he remains tachycardic and hypertensive, we might start him on a trial of beta-blocker.  Continue IV fluids for today.  AKI: Creatinine elevated in the range of 2.3 on presentation.  Most likely secondary to dehydration.  Continue IV fluids.  Renal function is improving  Hypertensive urgency: Blood pressure better now, but he still hypertensive.  On amlodipine, doxazosin at home which will be continued.  Continue as needed medications for severe hypertension.  Status post cardiac transplant: On chronic immunosuppressant therapy.  Follows at [facility].  Continue tacrolimus, prednisone  Prolonged QTC: QTC of 583 on presentation.  Will check new EKG today  Elevated lipase: Mildly elevated lipase.  Denies any abdomen pain today.  No indication for further work-up.  Seizure disorder: On Vimpat at home.  Restarted  History of CVA: Continue statin  Family communication: I called and discussed with patient's grandmom Barbara today.  She was in the impression that the patient will be transferred to [facility].  I explained her that our admitting physician had talked to the people  at [facility] transplant team  before admitting his grandson here, and was told by physicians at [facility] that he does not need transfer there.  He  issues are related to GI and diabetes .   Patient's grandmom  is agreeable for management here now.  I promised her that we will continue to call and update her         DVT prophylaxis:SCD Code Status: Full Family Communication: Called and discussed with grandmother on phone Status is: Inpatient  Remains inpatient appropriate because:Hemodynamically unstable   Dispo: The patient is from: Home              Anticipated d/c is to: Home              Patient currently is not medically stable to d/c.   Difficult to place patient No    Consultants: GI  Procedures:EGD  Antimicrobials:  Anti-infectives (From admission, onward)   Start     Dose/Rate Route Frequency Ordered Stop   02/19/21 1700  valGANciclovir (VALCYTE) 450 MG tablet TABS 450 mg        450 mg Oral Daily 02/19/21 1047        Subjective: Patient seen and examined at the bedside this morning.  Hypotensive and tachycardic during my evaluation.  Still complains of some abdominal pain but denies any nausea or vomiting.  Objective: Vitals:   02/20/21 1400 02/20/21 1415 02/20/21 1430 02/20/21 1445  BP: (!) 167/87 (!) 157/85 (!) 146/76 (!) 151/83  Pulse: (!) 132 (!) 125 (!) 122 (!) 110  Resp: 13 11 (!) 21 (!) 28  Temp:      TempSrc:      SpO2: 100% 99% 100% 95%  Weight:      Height:        Intake/Output Summary (Last 24 hours) at 02/20/2021 1449 Last data filed at 02/20/2021 0327 Gross per 24 hour  Intake 100 ml  Output 1200 ml  Net -1100 ml   Filed Weights   02/19/21 0031  Weight: 47.4 kg    Examination:  General exam: Not in distress,thin built HEENT:PERRL,Oral mucosa moist, Ear/Nose normal on gross exam Respiratory system: Bilateral equal air entry, normal vesicular breath sounds, no wheezes or crackles  Cardiovascular system: sinus tachy,. No JVD, murmurs, rubs, gallops or clicks.  Surgical scar on the chest. Gastrointestinal system: Abdomen is nondistended, soft and nontender. No organomegaly or masses felt. Normal bowel sounds heard. Central nervous system:  Alert and oriented. No focal neurological deficits. Extremities: No edema, no clubbing ,no cyanosis Skin: No rashes, lesions or ulcers,no icterus ,no pallor,tattos    Data Reviewed: I have personally reviewed following labs and imaging studies  CBC: Recent Labs  Lab 02/19/21 0133 02/19/21 0148 02/19/21 1109 02/19/21 1650  WBC 6.5  --   --   --   NEUTROABS 5.8  --   --   --   HGB 11.5* 11.9* 9.8* 7.9*  HCT 34.3* 35.0* 30.5* 24.3*  MCV 73.1*  --   --   --   PLT 411*  --   --   --    Basic Metabolic Panel: Recent Labs  Lab 02/19/21 0133 02/19/21 0148 02/19/21 1120 02/19/21 1533 02/19/21 1917 02/20/21 0020 02/20/21 0732  NA 137   < > 142 142 140 141 141  K 5.4*   < > 4.5 4.3 4.0 3.7 3.8  CL 100   < > 107 110 108 107 107  CO2 18*   < > 23 26  24 24 28   GLUCOSE 598*   < > 328* 190* 191* 211* 145*  BUN 28*   < > 29* 25* 20 16 10   CREATININE 2.38*   < > 2.41* 2.00* 1.76* 1.62* 1.27*  CALCIUM 9.2   < > 8.7* 8.8* 8.6* 8.9 8.9  MG 1.7  --   --   --   --   --   --    < > = values in this interval not displayed.   GFR: Estimated Creatinine Clearance: 60.6 mL/min (A) (by C-G formula based on SCr of 1.27 mg/dL (H)). Liver Function Tests: Recent Labs  Lab 02/19/21 0133  AST 24  ALT 24  ALKPHOS 76  BILITOT 1.1  PROT 7.3  ALBUMIN 4.1   Recent Labs  Lab 02/19/21 0133  LIPASE 83*   No results for input(s): AMMONIA in the last 168 hours. Coagulation Profile: No results for input(s): INR, PROTIME in the last 168 hours. Cardiac Enzymes: No results for input(s): CKTOTAL, CKMB, CKMBINDEX, TROPONINI in the last 168 hours. BNP (last 3 results) No results for input(s): PROBNP in the last 8760 hours. HbA1C: Recent Labs    02/19/21 0845  HGBA1C 9.3*   CBG: Recent Labs  Lab 02/20/21 0231 02/20/21 0335 02/20/21 0434 02/20/21 0751 02/20/21 1158  GLUCAP 190* 163* 150* 153* 203*   Lipid Profile: No results for input(s): CHOL, HDL, LDLCALC, TRIG, CHOLHDL, LDLDIRECT in  the last 72 hours. Thyroid Function Tests: No results for input(s): TSH, T4TOTAL, FREET4, T3FREE, THYROIDAB in the last 72 hours. Anemia Panel: No results for input(s): VITAMINB12, FOLATE, FERRITIN, TIBC, IRON, RETICCTPCT in the last 72 hours. Sepsis Labs: No results for input(s): PROCALCITON, LATICACIDVEN in the last 168 hours.  Recent Results (from the past 240 hour(s))  Resp Panel by RT-PCR (Flu A&B, Covid) Nasopharyngeal Swab     Status: None   Collection Time: 02/19/21  4:23 AM   Specimen: Nasopharyngeal Swab; Nasopharyngeal(NP) swabs in vial transport medium  Result Value Ref Range Status   SARS Coronavirus 2 by RT PCR NEGATIVE NEGATIVE Final    Comment: (NOTE) SARS-CoV-2 target nucleic acids are NOT DETECTED.  The SARS-CoV-2 RNA is generally detectable in upper respiratory specimens during the acute phase of infection. The lowest concentration of SARS-CoV-2 viral copies this assay can detect is 138 copies/mL. A negative result does not preclude SARS-Cov-2 infection and should not be used as the sole basis for treatment or other patient management decisions. A negative result may occur with  improper specimen collection/handling, submission of specimen other than nasopharyngeal swab, presence of viral mutation(s) within the areas targeted by this assay, and inadequate number of viral copies(<138 copies/mL). A negative result must be combined with clinical observations, patient history, and epidemiological information. The expected result is Negative.  Fact Sheet for Patients:  02/22/21  Fact Sheet for Healthcare Providers:  02/21/21  This test is no t yet approved or cleared by the [place] FDA and  has been authorized for detection and/or diagnosis of SARS-CoV-2 by FDA under an Emergency Use Authorization (EUA). This EUA will remain  in effect (meaning this test can be used) for the duration of  the COVID-19 declaration under Section 564(b)(1) of the Act, 21 U.S.C.section 360bbb-3(b)(1), unless the authorization is terminated  or revoked sooner.       Influenza A by PCR NEGATIVE NEGATIVE Final   Influenza B by PCR NEGATIVE NEGATIVE Final    Comment: (NOTE) The Xpert Xpress SARS-CoV-2/FLU/RSV plus assay is intended  as an aid in the diagnosis of influenza from Nasopharyngeal swab specimens and should not be used as a sole basis for treatment. Nasal washings and aspirates are unacceptable for Xpert Xpress SARS-CoV-2/FLU/RSV testing.  Fact Sheet for Patients: https://www.fda.gov/media/152166/download  Fact Sheet for Healthcare Providers: SeriousBroker.ithttps://www.fda.gov/media/152162/download  This test is not yet approved or cleared by the [place] FDA and has been authorized for detection and/or diagnosis of SARS[place]-CoV-2 by FDA under an Emergency Use Authorization (EUA). This EUA will remain in effect (meaning this test can be used) for the duration of the COVID-19 declaration under Section 564(b)(1) of the Act, 21 U.S.C. section 360bbb-3(b)(1), unless the authorization is terminated or revoked.  Performed at [facility], [address] [place], [place] [address]   Culture, blood (routine x 2)     Status: None (Preliminary result)   Collection Time: 02/19/21  7:25 AM   Specimen: BLOOD RIGHT ARM  Result Value Ref Range Status   Specimen Description BLOOD RIGHT ARM  Final   Special Requests   Final    BOTTLES DRAWN AEROBIC ONLY Blood Culture results may not be optimal due to an inadequate volume of blood received in culture bottles   Culture   Final    NO GROWTH < 24 HOURS Performed at [facility], [address] [place], [place] [address]    Report Status PENDING  Incomplete  Culture, blood (routine x 2)     Status: None (Preliminary result)   Collection Time: 02/19/21  7:30 AM   Specimen: BLOOD RIGHT HAND  Result Value Ref Range Status   Specimen  Description BLOOD RIGHT HAND  Final   Special Requests   Final    BOTTLES DRAWN AEROBIC ONLY Blood Culture results may not be optimal due to an inadequate volume of blood received in culture bottles   Culture   Final    NO GROWTH < 24 HOURS Performed at [facility], [address] [place], [place] [address]    Report Status PENDING  Incomplete         Radiology Studies: DG Chest 2 View  Result Date: 02/19/2021 CLINICAL DATA:  Chest pain EXAM: CHEST - 2 VIEW COMPARISON:  12/02/2020 FINDINGS: The heart size and mediastinal contours are within normal limits. Both lungs are clear. The visualized skeletal structures are unremarkable. Postsurgical changes are noted. IMPRESSION: No active cardiopulmonary disease. Electronically Signed   By: Alcide CleverMark  Lukens M.D.   On: 02/19/2021 00:49        Scheduled Meds: . amLODipine  10 mg Oral Daily  . doxazosin  1 mg Oral QHS  . insulin aspart  0-9 Units Subcutaneous Q4H  . insulin glargine  10 Units Subcutaneous QHS  . lacosamide  50 mg Oral BID  . [START ON 02/23/2021] pantoprazole  40 mg Intravenous Q12H  . pravastatin  40 mg Oral Daily  . predniSONE  5 mg Oral Q breakfast  . [START ON 02/21/2021] Tacrolimus ER  10 mg Oral QAC breakfast  . valGANciclovir  450 mg Oral Daily   Continuous Infusions: . sodium chloride Stopped (02/19/21 0812)  . pantoprazole (PROTONIX) 80 mg IVPB       LOS: 1 day    Time spent: 35 mins.More than 50% of that time was spent in counseling and/or coordination of care.      Burnadette PopAmrit Jayvien Rowlette, MD [facility] P3/29/2022, 2:49 PM

## 2021-02-20 NOTE — Progress Notes (Signed)
Subjective: No new complaints.  Objective: Vital signs in last 24 hours: Temp:  [98.9 F (37.2 C)] 98.9 F (37.2 C) (03/28 1455) Pulse Rate:  [96-128] 111 (03/29 1200) Resp:  [14-30] 14 (03/29 1200) BP: (117-184)/(70-105) 146/93 (03/29 1200) SpO2:  [95 %-100 %] 98 % (03/29 1200)    Intake/Output from previous day: 03/28 0701 - 03/29 0700 In: 250 [I.V.:150; IV Piggyback:100] Out: 1200 [Urine:1200] Intake/Output this shift: No intake/output data recorded.  General appearance: alert and no distress GI: soft, non-tender; bowel sounds normal; no masses,  no organomegaly  Lab Results: Recent Labs    02/19/21 0133 02/19/21 0148 02/19/21 1109 02/19/21 1650  WBC 6.5  --   --   --   HGB 11.5* 11.9* 9.8* 7.9*  HCT 34.3* 35.0* 30.5* 24.3*  PLT 411*  --   --   --    BMET Recent Labs    02/19/21 1917 02/20/21 0020 02/20/21 0732  NA 140 141 141  K 4.0 3.7 3.8  CL 108 107 107  CO2 24 24 28   GLUCOSE 191* 211* 145*  BUN 20 16 10   CREATININE 1.76* 1.62* 1.27*  CALCIUM 8.6* 8.9 8.9   LFT Recent Labs    02/19/21 0133  PROT 7.3  ALBUMIN 4.1  AST 24  ALT 24  ALKPHOS 76  BILITOT 1.1   PT/INR No results for input(s): LABPROT, INR in the last 72 hours. Hepatitis Panel No results for input(s): HEPBSAG, HCVAB, HEPAIGM, HEPBIGM in the last 72 hours. C-Diff No results for input(s): CDIFFTOX in the last 72 hours. Fecal Lactopherrin No results for input(s): FECLLACTOFRN in the last 72 hours.  Studies/Results: DG Chest 2 View  Result Date: 02/19/2021 CLINICAL DATA:  Chest pain EXAM: CHEST - 2 VIEW COMPARISON:  12/02/2020 FINDINGS: The heart size and mediastinal contours are within normal limits. Both lungs are clear. The visualized skeletal structures are unremarkable. Postsurgical changes are noted. IMPRESSION: No active cardiopulmonary disease. Electronically Signed   By: 02/21/2021 M.D.   On: 02/19/2021 00:49    Medications:  Scheduled: . amLODipine  10 mg Oral  Daily  . doxazosin  1 mg Oral QHS  . insulin aspart  0-9 Units Subcutaneous Q4H  . insulin glargine  10 Units Subcutaneous QHS  . lacosamide  50 mg Oral BID  . [START ON 02/23/2021] pantoprazole  40 mg Intravenous Q12H  . pravastatin  40 mg Oral Daily  . predniSONE  5 mg Oral Q breakfast  . [START ON 02/21/2021] Tacrolimus ER  10 mg Oral QAC breakfast  . valGANciclovir  450 mg Oral Daily   Continuous: . sodium chloride Stopped (02/19/21 0812)  . pantoprazole (PROTONIX) 80 mg IVPB      Assessment/Plan: 1) Nausea/vomiting. 2) Gastroparesis. 3) Poorly controlled DM. 4) S/p cardiac transplant.   The patient is stable today.  His EGD was reviewed and there was an area of erythema in his gastric mucosa, which correlates with trauma from his vomiting.  This is likely the source of his coffee-ground emesis and hematemesis.  Today he states that he did not have any further vomiting.  Plan: 1) Follow HGB and transfuse as necessary. 2) Control DM. 3) Await biopsy results from the esophagus.  LOS: 1 day   Malyia Moro D 02/20/2021, 1:41 PM

## 2021-02-20 NOTE — Plan of Care (Signed)

## 2021-02-20 NOTE — Progress Notes (Signed)
Inpatient Diabetes Program Recommendations  AACE/ADA: New Consensus Statement on Inpatient Glycemic Control (2015)  Target Ranges:  Prepandial:   less than 140 mg/dL      Peak postprandial:   less than 180 mg/dL (1-2 hours)      Critically ill patients:  140 - 180 mg/dL   Lab Results  Component Value Date   GLUCAP 203 (H) 02/20/2021   HGBA1C 9.3 (H) 02/19/2021    Review of Glycemic Control  Diabetes history: DM1 Outpatient Diabetes medications: Tresiba 14 units QHS, Apidra 0-45 units with meals and snacks Current orders for Inpatient glycemic control: Lantus 10 units QHS, Novolog 0-9 units Q4H.  On Prednisone 5 mg QD Pt admits he hasn't been keeping up with his diabetes and blood sugars.  Inpatient Diabetes Program Recommendations:     Increase Lantus to 14 units QHS Change Novolog to 0-9 units TID with meals and HS Add meal coverage 4-5 units TID with meals if eating > 50% meal.  Continue to follow.  Thank you. Ailene Ards, RD, LDN, CDE Inpatient Diabetes Coordinator [phone]

## 2021-02-20 NOTE — ED Notes (Addendum)
This RN spoke to provider about patients endotool. New orders to be placed. This RN also informed provider about patients periodic N/V. This RN will continue to monitor the patient at this time.

## 2021-02-20 NOTE — Plan of Care (Signed)
Initiation of Care Plan Problem: Education: Goal: Knowledge of General Education information will improve Description: Including pain rating scale, medication(s)/side effects and non-pharmacologic comfort measures 02/20/2021 2050 by Luna Kitchens, RN Outcome: Progressing 02/20/2021 2049 by Luna Kitchens, RN Outcome: Progressing   Problem: Clinical Measurements: Goal: Ability to maintain clinical measurements within normal limits will improve 02/20/2021 2050 by Luna Kitchens, RN Outcome: Progressing 02/20/2021 2049 by Luna Kitchens, RN Outcome: Progressing Goal: Will remain free from infection 02/20/2021 2050 by Luna Kitchens, RN Outcome: Progressing 02/20/2021 2049 by Luna Kitchens, RN Outcome: Progressing Goal: Diagnostic test results will improve 02/20/2021 2050 by Luna Kitchens, RN Outcome: Progressing 02/20/2021 2049 by Luna Kitchens, RN Outcome: Progressing Goal: Respiratory complications will improve 02/20/2021 2050 by Luna Kitchens, RN Outcome: Progressing 02/20/2021 2049 by Luna Kitchens, RN Outcome: Progressing Goal: Cardiovascular complication will be avoided 02/20/2021 2050 by Luna Kitchens, RN Outcome: Progressing 02/20/2021 2049 by Luna Kitchens, RN Outcome: Progressing   Problem: Activity: Goal: Risk for activity intolerance will decrease 02/20/2021 2050 by Luna Kitchens, RN Outcome: Progressing 02/20/2021 2049 by Luna Kitchens, RN Outcome: Progressing   Problem: Nutrition: Goal: Adequate nutrition will be maintained 02/20/2021 2050 by Luna Kitchens, RN Outcome: Progressing 02/20/2021 2049 by Luna Kitchens, RN Outcome: Progressing   Problem: Coping: Goal: Level of anxiety will decrease 02/20/2021 2050 by Luna Kitchens, RN Outcome: Progressing 02/20/2021 2049 by Luna Kitchens, RN Outcome: Progressing   Problem: Elimination: Goal: Will not experience complications related to bowel motility 02/20/2021 2050 by Luna Kitchens, RN Outcome: Progressing 02/20/2021 2049  by Luna Kitchens, RN Outcome: Progressing Goal: Will not experience complications related to urinary retention 02/20/2021 2050 by Luna Kitchens, RN Outcome: Progressing 02/20/2021 2049 by Luna Kitchens, RN Outcome: Progressing   Problem: Pain Managment: Goal: General experience of comfort will improve 02/20/2021 2050 by Luna Kitchens, RN Outcome: Progressing 02/20/2021 2049 by Luna Kitchens, RN Outcome: Progressing   Problem: Safety: Goal: Ability to remain free from injury will improve 02/20/2021 2050 by Luna Kitchens, RN Outcome: Progressing 02/20/2021 2049 by Luna Kitchens, RN Outcome: Progressing   Problem: Skin Integrity: Goal: Risk for impaired skin integrity will decrease 02/20/2021 2050 by Luna Kitchens, RN Outcome: Progressing 02/20/2021 2049 by Luna Kitchens, RN Outcome: Progressing   Problem: Education: Goal: Ability to describe self-care measures that may prevent or decrease complications (Diabetes Survival Skills Education) will improve Outcome: Progressing Goal: Individualized Educational Video(s) Outcome: Progressing   Problem: Coping: Goal: Ability to adjust to condition or change in health will improve Outcome: Progressing   Problem: Fluid Volume: Goal: Ability to maintain a balanced intake and output will improve Outcome: Progressing   Problem: Health Behavior/Discharge Planning: Goal: Ability to identify and utilize available resources and services will improve Outcome: Progressing Goal: Ability to manage health-related needs will improve Outcome: Progressing   Problem: Metabolic: Goal: Ability to maintain appropriate glucose levels will improve Outcome: Progressing   Problem: Nutritional: Goal: Maintenance of adequate nutrition will improve Outcome: Progressing Goal: Progress toward achieving an optimal weight will improve Outcome: Progressing   Problem: Skin Integrity: Goal: Risk for impaired skin integrity will decrease Outcome: Progressing    Problem: Tissue Perfusion: Goal: Adequacy of tissue perfusion will improve Outcome: Progressing

## 2021-02-20 NOTE — ED Notes (Signed)
Report given to Rosalita Chessman, RN of [phone]

## 2021-02-21 DIAGNOSIS — D509 Iron deficiency anemia, unspecified: Secondary | ICD-10-CM

## 2021-02-21 DIAGNOSIS — E1043 Type 1 diabetes mellitus with diabetic autonomic (poly)neuropathy: Secondary | ICD-10-CM

## 2021-02-21 DIAGNOSIS — R000 Tachycardia, unspecified: Secondary | ICD-10-CM

## 2021-02-21 DIAGNOSIS — I160 Hypertensive urgency: Secondary | ICD-10-CM

## 2021-02-21 DIAGNOSIS — R748 Abnormal levels of other serum enzymes: Secondary | ICD-10-CM

## 2021-02-21 LAB — CBC WITH DIFFERENTIAL/PLATELET
Abs Immature Granulocytes: 0.07 10*3/uL (ref 0.00–0.07)
Basophils Absolute: 0.1 10*3/uL (ref 0.0–0.1)
Basophils Relative: 0 %
Eosinophils Absolute: 0 10*3/uL (ref 0.0–0.5)
Eosinophils Relative: 0 %
HCT: 32.7 % — ABNORMAL LOW (ref 39.0–52.0)
Hemoglobin: 10.9 g/dL — ABNORMAL LOW (ref 13.0–17.0)
Immature Granulocytes: 0 %
Lymphocytes Relative: 12 %
Lymphs Abs: 1.9 10*3/uL (ref 0.7–4.0)
MCH: 24.2 pg — ABNORMAL LOW (ref 26.0–34.0)
MCHC: 33.3 g/dL (ref 30.0–36.0)
MCV: 72.5 fL — ABNORMAL LOW (ref 80.0–100.0)
Monocytes Absolute: 1.2 10*3/uL — ABNORMAL HIGH (ref 0.1–1.0)
Monocytes Relative: 7 %
Neutro Abs: 13.1 10*3/uL — ABNORMAL HIGH (ref 1.7–7.7)
Neutrophils Relative %: 81 %
Platelets: 418 10*3/uL — ABNORMAL HIGH (ref 150–400)
RBC: 4.51 MIL/uL (ref 4.22–5.81)
RDW: 18 % — ABNORMAL HIGH (ref 11.5–15.5)
WBC: 16.3 10*3/uL — ABNORMAL HIGH (ref 4.0–10.5)
nRBC: 0 % (ref 0.0–0.2)

## 2021-02-21 LAB — BASIC METABOLIC PANEL
Anion gap: 8 (ref 5–15)
BUN: 6 mg/dL (ref 6–20)
CO2: 26 mmol/L (ref 22–32)
Calcium: 8.9 mg/dL (ref 8.9–10.3)
Chloride: 103 mmol/L (ref 98–111)
Creatinine, Ser: 1.09 mg/dL (ref 0.61–1.24)
GFR, Estimated: >60 mL/min (ref >60)
Glucose, Bld: 65 mg/dL — ABNORMAL LOW (ref 70–99)
Potassium: 3.3 mmol/L — ABNORMAL LOW (ref 3.5–5.1)
Sodium: 137 mmol/L (ref 135–145)

## 2021-02-21 LAB — CBC
HCT: 33.8 % — ABNORMAL LOW (ref 39.0–52.0)
Hemoglobin: 11 g/dL — ABNORMAL LOW (ref 13.0–17.0)
MCH: 23.5 pg — ABNORMAL LOW (ref 26.0–34.0)
MCHC: 32.5 g/dL (ref 30.0–36.0)
MCV: 72.2 fL — ABNORMAL LOW (ref 80.0–100.0)
Platelets: 383 10*3/uL (ref 150–400)
RBC: 4.68 MIL/uL (ref 4.22–5.81)
RDW: 18 % — ABNORMAL HIGH (ref 11.5–15.5)
WBC: 10 10*3/uL (ref 4.0–10.5)
nRBC: 0 % (ref 0.0–0.2)

## 2021-02-21 LAB — GLUCOSE, CAPILLARY
Glucose-Capillary: 183 mg/dL — ABNORMAL HIGH (ref 70–99)
Glucose-Capillary: 54 mg/dL — ABNORMAL LOW (ref 70–99)
Glucose-Capillary: 130 mg/dL — ABNORMAL HIGH (ref 70–99)
Glucose-Capillary: 189 mg/dL — ABNORMAL HIGH (ref 70–99)
Glucose-Capillary: 241 mg/dL — ABNORMAL HIGH (ref 70–99)
Glucose-Capillary: 231 mg/dL — ABNORMAL HIGH (ref 70–99)
Glucose-Capillary: 191 mg/dL — ABNORMAL HIGH (ref 70–99)
Glucose-Capillary: 203 mg/dL — ABNORMAL HIGH (ref 70–99)

## 2021-02-21 LAB — LIPASE, BLOOD: Lipase: 38 U/L (ref 11–51)

## 2021-02-21 MED ORDER — METOCLOPRAMIDE HCL 5 MG/ML IJ SOLN
5.0000 mg | Freq: Three times a day (TID) | INTRAMUSCULAR | Status: DC
  Administered 2021-02-21 – 2021-02-24 (×11): 5 mg via INTRAVENOUS
  Filled 2021-02-21 (×11): qty 2

## 2021-02-21 MED ORDER — TACROLIMUS ER 4 MG PO TB24
8.0000 mg | ORAL_TABLET | Freq: Every day | ORAL | Status: DC
  Administered 2021-02-22 – 2021-02-24 (×3): 8 mg via ORAL
  Filled 2021-02-21 (×3): qty 2

## 2021-02-21 MED ORDER — SERTRALINE HCL 25 MG PO TABS
50.0000 mg | ORAL_TABLET | Freq: Every day | ORAL | Status: DC
  Administered 2021-02-21 – 2021-02-24 (×4): 50 mg via ORAL
  Filled 2021-02-21 (×4): qty 2

## 2021-02-21 MED ORDER — INSULIN GLARGINE 100 UNIT/ML ~~LOC~~ SOLN
20.0000 [IU] | Freq: Every day | SUBCUTANEOUS | Status: DC
  Administered 2021-02-21 – 2021-02-22 (×2): 20 [IU] via SUBCUTANEOUS
  Filled 2021-02-21 (×3): qty 0.2

## 2021-02-21 MED ORDER — METHYLPREDNISOLONE SODIUM SUCC 40 MG IJ SOLR
40.0000 mg | Freq: Two times a day (BID) | INTRAMUSCULAR | Status: DC
  Administered 2021-02-21 – 2021-02-22 (×2): 40 mg via INTRAVENOUS
  Filled 2021-02-21 (×2): qty 1

## 2021-02-21 MED ORDER — CARVEDILOL 3.125 MG PO TABS
3.1250 mg | ORAL_TABLET | Freq: Two times a day (BID) | ORAL | Status: DC
  Administered 2021-02-21 – 2021-02-22 (×2): 3.125 mg via ORAL
  Filled 2021-02-21 (×2): qty 1

## 2021-02-21 MED ORDER — INSULIN GLARGINE 100 UNIT/ML ~~LOC~~ SOLN
14.0000 [IU] | Freq: Every day | SUBCUTANEOUS | Status: DC
  Filled 2021-02-21: qty 0.14

## 2021-02-21 MED ORDER — SODIUM CHLORIDE 0.9 % IV SOLN
50.0000 mg | INTRAVENOUS | Status: DC
  Administered 2021-02-22 – 2021-02-23 (×2): 50 mg via INTRAVENOUS
  Filled 2021-02-21 (×5): qty 50

## 2021-02-21 MED ORDER — POTASSIUM CHLORIDE CRYS ER 20 MEQ PO TBCR
40.0000 meq | EXTENDED_RELEASE_TABLET | Freq: Once | ORAL | Status: AC
  Administered 2021-02-21: 40 meq via ORAL
  Filled 2021-02-21: qty 2

## 2021-02-21 MED ORDER — TACROLIMUS ER 1 MG PO TB24
2.0000 mg | ORAL_TABLET | Freq: Every day | ORAL | Status: DC
  Administered 2021-02-22 – 2021-02-24 (×3): 2 mg via ORAL
  Filled 2021-02-21 (×3): qty 2

## 2021-02-21 MED ORDER — SODIUM CHLORIDE 0.9 % IV SOLN
100.0000 mg | Freq: Once | INTRAVENOUS | Status: DC
  Filled 2021-02-21: qty 100

## 2021-02-21 MED ORDER — INSULIN ASPART 100 UNIT/ML ~~LOC~~ SOLN
3.0000 [IU] | Freq: Three times a day (TID) | SUBCUTANEOUS | Status: DC
  Administered 2021-02-23: 3 [IU] via SUBCUTANEOUS

## 2021-02-21 NOTE — Progress Notes (Signed)
Pharmacy Antibiotic Note  Manuel Baker is a 25 y.o. male with Hx cardiac transplant, admitted on 02/18/2021 with N/V noted to have esophageal candidiasis.  Pharmacy has been consulted for diflucan dosing, but drug interaction with tacrolimus - therapy discussed with MD and decided to change to anidulafungin  Plan: Anidulafungin 100mg  x1 then 50mg  q24 x14 days and re-evaluate  Treat 7 days post infection resolution  Height: 5\' 7"  (170.2 cm) Weight: 50.2 kg (110 lb 10.7 oz) IBW/kg (Calculated) : 66.1  Temp (24hrs), Avg:98.8 F (37.1 C), Min:98.1 F (36.7 C), Max:99.1 F (37.3 C)  Recent Labs  Lab 02/19/21 0133 02/19/21 0845 02/19/21 1917 02/20/21 0020 02/20/21 0732 02/20/21 1637 02/21/21 0122 02/21/21 1520  WBC 6.5  --   --   --   --   --  16.3* 10.0  CREATININE 2.38*   < > 1.76* 1.62* 1.27* 1.40* 1.09  --    < > = values in this interval not displayed.    Estimated Creatinine Clearance: 74.8 mL/min (by C-G formula based on SCr of 1.09 mg/dL).    Allergies  Allergen Reactions  . Ibuprofen     Told not to take ibuprofen after cardiac surgery  . Nsaids Other (See Comments)    Unknown reaction    Antimicrobials this admission:   Dose adjustments this admission:   Microbiology results:     02/22/21 Pharm.D. CPP, BCPS Clinical Pharmacist [phone] 02/21/2021 5:46 PM

## 2021-02-21 NOTE — Plan of Care (Signed)

## 2021-02-21 NOTE — Plan of Care (Signed)

## 2021-02-21 NOTE — Progress Notes (Signed)
Hypoglycemic Event  CBG: 54  Treatment: 50 mL D50 given per protocol due to pt inability to tolerate PO treatment.  Symptoms: Asymptomatic  Follow-up CBG: Time: 0409 CBG Result: 183  Comments: Will continue to monitor    Reynold Bowen

## 2021-02-21 NOTE — Progress Notes (Incomplete)
Subjective: Since I last evaluated the patient ***  Objective: Vital signs in last 24 hours: Temp:  [98.1 F (36.7 C)-99.1 F (37.3 C)] 99.1 F (37.3 C) (03/30 1124) Pulse Rate:  [100-139] 115 (03/30 1124) Resp:  [14-29] 19 (03/30 0341) BP: (117-169)/(72-101) 152/94 (03/30 1124) SpO2:  [96 %-100 %] 96 % (03/30 1124) Weight:  [50.2 kg-50.5 kg] 50.2 kg (03/30 0300) Last BM Date: 02/19/21  Intake/Output from previous day: 03/29 0701 - 03/30 0700 In: 1374.6 [I.V.:1324.6; IV Piggyback:50] Out: 1150 [Urine:1150] Intake/Output this shift: No intake/output data recorded.  {HA:1937902}  Lab Results: Recent Labs    02/19/21 0133 02/19/21 0148 02/19/21 1109 02/19/21 1650 02/21/21 0122  WBC 6.5  --   --   --  16.3*  HGB 11.5*   < > 9.8* 7.9* 10.9*  HCT 34.3*   < > 30.5* 24.3* 32.7*  PLT 411*  --   --   --  418*   < > = values in this interval not displayed.   BMET Recent Labs    02/20/21 0732 02/20/21 1637 02/21/21 0122  NA 141 136 137  K 3.8 3.6 3.3*  CL 107 101 103  CO2 28 21* 26  GLUCOSE 145* 311* 65*  BUN 10 8 6   CREATININE 1.27* 1.40* 1.09  CALCIUM 8.9 9.2 8.9   LFT Recent Labs    02/19/21 0133  PROT 7.3  ALBUMIN 4.1  AST 24  ALT 24  ALKPHOS 76  BILITOT 1.1   PT/INR No results for input(s): LABPROT, INR in the last 72 hours. Hepatitis Panel No results for input(s): HEPBSAG, HCVAB, HEPAIGM, HEPBIGM in the last 72 hours. C-Diff No results for input(s): CDIFFTOX in the last 72 hours. No results for input(s): CDIFFPCR in the last 72 hours. Fecal Lactopherrin No results for input(s): FECLLACTOFRN in the last 72 hours.  Studies/Results: No results found.  Medications: {medication reviewed/display:3041432}  Assessment/Plan: ***  LOS: 2 days   02/21/21 02/21/2021, 2:55 PM

## 2021-02-21 NOTE — Progress Notes (Addendum)
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  Currently he is living with his roommates and studies at [employer].  He has long history of gastroparesis from diabetes.  When he presented his blood sugars was in the range of 600, he was in sinus tachycardia, hypertensive, mildly hyperkalemic, creatinine of 2.38.   Patient was initially tried to be transferred to [facility] but they declined due to no need of transfer and lack of bed availability.  In the emergency department, he had an episode of hematemesis.  GI consulted and he underwent EGD.  Patient was also started on insulin drip for DKA.  Assessment & Plan:  Hematemesis/microcytic hypochromic anemia: - Presented with 2-day history of nausea and vomiting.Takes aspirin at home.  In the emergency department, he vomited dark blood with clots.  Hemoglobin was stable in the range of 11. underwent EGD by GI -EGD noted erythema in the gastric mucosa which was likely the source of coffee-ground emesis/hematemesis.  Continue Protonix.  Diet advance as tolerated.  On soft diet today.  Oral intake is poor due to intermittent vomiting -add low dose Solumedrol incase of component of AI Addendum: Path with esoph candidiasis-will start IV FLuconazole per pharmacy  Diabetic ketoacidosis, diabetes type 1, gastroparesis: Presented with nausea/vomiting.  Glucose was severely elevated on presentation, CO2 of 18, elevated anion gap.  Urinalysis was positive for ketones.   -Treated  with insulin drip and IV fluids - Hemoglobin A1c 9.3.  -CBGs improving with an episode of hypoglycemia earlier this morning, will cut down Lantus dose and Premeal NovoLog -For gastroparesis will start scheduled Reglan and continue Phenergan as needed  SIRS/persistent sinus tachycardia: On presentation, he was tachycardic.  Patient is afebrile without any leukocytosis.  Symptoms were thought to be secondary to DKA.  -Still low-grade tachycardia persists, patient reports that his heart rate is elevated at baseline  AKI:  -Due to DKA and vomiting, resolved  Hypertensive urgency:  -.  On amlodipine, doxazosin at baseline which was continued  Status post cardiac transplant: On chronic immunosuppressant therapy.  Follows at [facility].  Continue tacrolimus, prednisone per home regimen  Prolonged QTC: QTC of 583 on admission, will repeat EKG  Elevated lipase: Mildly elevated lipase, likely secondary to DKA, will repeat  Seizure disorder: On Vimpat at home.  Restarted  History of CVA: Continue statin  DVT prophylaxis:SCD Code Status: Full Family Communication: No family at bedside status is: Inpatient  Remains inpatient appropriate because:Hemodynamically unstable   Dispo: The patient is from: Home              Anticipated d/c is to: Home              Patient currently is not medically stable to d/c.   Difficult to place patient No    Consultants: GI  Procedures:EGD   Impression:               - Whitish esophageal mucosa/plaques were found,  suspicious for candidiasis-cells for cytology                            obtained; no M-W tear was noted.                           - Patchy gastritis in the cardia; some coffee                            grounds noted but no fresh heme noted on exam.                           - Normal examined duodenum.  Antimicrobials:  Anti-infectives (From admission, onward)   Start     Dose/Rate Route Frequency Ordered Stop    02/19/21 1700  valGANciclovir (VALCYTE) 450 MG tablet TABS 450 mg        450 mg Oral Daily 02/19/21 1047        Subjective: -Feels nauseated, vomited last night, denies any abdominal pain  Objective: Vitals:   02/21/21 0300 02/21/21 0314 02/21/21 0341 02/21/21 0800  BP: (!) 143/98   132/80  Pulse: (!) 105   (!) 104  Resp: (!) 29 20 19    Temp: 99 F (37.2 C)     TempSrc: Oral   Oral  SpO2: 98%   96%  Weight: 50.2 kg     Height:        Intake/Output Summary (Last 24 hours) at 02/21/2021 1026 Last data filed at 02/21/2021 0700 Gross per 24 hour  Intake 1374.6 ml  Output 1150 ml  Net 224.6 ml   Filed Weights   02/19/21 0031 02/20/21 1650 02/21/21 0300  Weight: 47.4 kg 50.5 kg 50.2 kg    Examination:  General exam: Pleasant chronically ill young male lying in bed, awake alert oriented to self, place HEENT: No JVD CVS: S1-S2, regular rate rhythm Lungs: Clear bilaterally Abdomen: Soft, nontender, nondistended, bowel sounds present Extremities: No edema Skin: No rashes on exposed skin    Data Reviewed: I have personally reviewed following labs and imaging studies  CBC: Recent Labs  Lab 02/19/21 0133 02/19/21 0148 02/19/21 1109 02/19/21 1650 02/21/21 0122  WBC 6.5  --   --   --  16.3*  NEUTROABS 5.8  --   --   --  13.1*  HGB 11.5* 11.9* 9.8* 7.9* 10.9*  HCT 34.3* 35.0* 30.5* 24.3* 32.7*  MCV 73.1*  --   --   --  72.5*  PLT 411*  --   --   --  418*   Basic Metabolic Panel: Recent Labs  Lab 02/19/21 0133 02/19/21 0148 02/19/21 1917 02/20/21 0020 02/20/21 0732 02/20/21 1637 02/21/21 0122  NA 137   < > 140 141 141 136 137  K 5.4*   < > 4.0 3.7 3.8 3.6 3.3*  CL 100   < > 108 107 107 101 103  CO2 18*   < > 24 24 28  21* 26  GLUCOSE 598*   < > 191* 211* 145* 311* 65*  BUN 28*   < > 20 16 10 8 6   CREATININE 2.38*   < > 1.76* 1.62* 1.27* 1.40* 1.09  CALCIUM 9.2   < > 8.6* 8.9 8.9 9.2 8.9  MG 1.7  --   --   --   --   --   --    < > =  values in this interval  not displayed.   GFR: Estimated Creatinine Clearance: 74.8 mL/min (by C-G formula based on SCr of 1.09 mg/dL). Liver Function Tests: Recent Labs  Lab 02/19/21 0133  AST 24  ALT 24  ALKPHOS 76  BILITOT 1.1  PROT 7.3  ALBUMIN 4.1   Recent Labs  Lab 02/19/21 0133  LIPASE 83*   No results for input(s): AMMONIA in the last 168 hours. Coagulation Profile: No results for input(s): INR, PROTIME in the last 168 hours. Cardiac Enzymes: No results for input(s): CKTOTAL, CKMB, CKMBINDEX, TROPONINI in the last 168 hours. BNP (last 3 results) No results for input(s): PROBNP in the last 8760 hours. HbA1C: Recent Labs    02/19/21 0845  HGBA1C 9.3*   CBG: Recent Labs  Lab 02/20/21 2124 02/20/21 2323 02/21/21 0336 02/21/21 0409 02/21/21 0746  GLUCAP 170* 125* 54* 183* 130*   Lipid Profile: No results for input(s): CHOL, HDL, LDLCALC, TRIG, CHOLHDL, LDLDIRECT in the last 72 hours. Thyroid Function Tests: No results for input(s): TSH, T4TOTAL, FREET4, T3FREE, THYROIDAB in the last 72 hours. Anemia Panel: No results for input(s): VITAMINB12, FOLATE, FERRITIN, TIBC, IRON, RETICCTPCT in the last 72 hours. Sepsis Labs: No results for input(s): PROCALCITON, LATICACIDVEN in the last 168 hours.  Recent Results (from the past 240 hour(s))  Resp Panel by RT-PCR (Flu A&B, Covid) Nasopharyngeal Swab     Status: None   Collection Time: 02/19/21  4:23 AM   Specimen: Nasopharyngeal Swab; Nasopharyngeal(NP) swabs in vial transport medium  Result Value Ref Range Status   SARS Coronavirus 2 by RT PCR NEGATIVE NEGATIVE Final    Comment: (NOTE) SARS-CoV-2 target nucleic acids are NOT DETECTED.  The SARS-CoV-2 RNA is generally detectable in upper respiratory specimens during the acute phase of infection. The lowest concentration of SARS-CoV-2 viral copies this assay can detect is 138 copies/mL. A negative result does not preclude SARS-Cov-2 infection and should not be used as the sole basis  for treatment or other patient management decisions. A negative result may occur with  improper specimen collection/handling, submission of specimen other than nasopharyngeal swab, presence of viral mutation(s) within the areas targeted by this assay, and inadequate number of viral copies(<138 copies/mL). A negative result must be combined with clinical observations, patient history, and epidemiological information. The expected result is Negative.  Fact Sheet for Patients:  BloggerCourse.com  Fact Sheet for Healthcare Providers:  SeriousBroker.it  This test is no t yet approved or cleared by the [place] FDA and  has been authorized for detection and/or diagnosis of SARS-CoV-2 by FDA under an Emergency Use Authorization (EUA). This EUA will remain  in effect (meaning this test can be used) for the duration of the COVID-19 declaration under Section 564(b)(1) of the Act, 21 U.S.C.section 360bbb-3(b)(1), unless the authorization is terminated  or revoked sooner.       Influenza A by PCR NEGATIVE NEGATIVE Final   Influenza B by PCR NEGATIVE NEGATIVE Final    Comment: (NOTE) The Xpert Xpress SARS-CoV-2/FLU/RSV plus assay is intended as an aid in the diagnosis of influenza from Nasopharyngeal swab specimens and should not be used as a sole basis for treatment. Nasal washings and aspirates are unacceptable for Xpert Xpress SARS-CoV-2/FLU/RSV testing.  Fact Sheet for Patients: BloggerCourse.com  Fact Sheet for Healthcare Providers: SeriousBroker.it  This test is not yet approved or cleared by the [place] FDA and has been authorized for detection and/or diagnosis of SARS-CoV-2 by FDA under an Emergency Use Authorization (EUA).  This EUA will remain in effect (meaning this test can be used) for the duration of the COVID-19 declaration under Section 564(b)(1) of the Act, 21  U.S.C. section 360bbb-3(b)(1), unless the authorization is terminated or revoked.  Performed at [facility], [address] [place], [place] [address]   Culture, blood (routine x 2)     Status: None (Preliminary result)   Collection Time: 02/19/21  7:25 AM   Specimen: BLOOD RIGHT ARM  Result Value Ref Range Status   Specimen Description BLOOD RIGHT ARM  Final   Special Requests   Final    BOTTLES DRAWN AEROBIC ONLY Blood Culture results may not be optimal due to an inadequate volume of blood received in culture bottles   Culture   Final    NO GROWTH 2 DAYS Performed at [facility], [address], [place], [place] [address]    Report Status PENDING  Incomplete  Culture, blood (routine x 2)     Status: None (Preliminary result)   Collection Time: 02/19/21  7:30 AM   Specimen: BLOOD RIGHT HAND  Result Value Ref Range Status   Specimen Description BLOOD RIGHT HAND  Final   Special Requests   Final    BOTTLES DRAWN AEROBIC ONLY Blood Culture results may not be optimal due to an inadequate volume of blood received in culture bottles   Culture   Final    NO GROWTH 2 DAYS Performed at [facility], [address] [place], [place] [address]    Report Status PENDING  Incomplete  MRSA PCR Screening     Status: None   Collection Time: 02/20/21  7:50 PM   Specimen: Nasal Mucosa; Nasopharyngeal  Result Value Ref Range Status   MRSA by PCR NEGATIVE NEGATIVE Final    Comment:        The GeneXpert MRSA Assay (FDA approved for NASAL specimens only), is one component of a comprehensive MRSA colonization surveillance program. It is not intended to diagnose MRSA infection nor to guide or monitor treatment for MRSA infections. Performed at [facility], [address], [place], [place] [address]      Scheduled Meds: . amLODipine  10 mg Oral Daily  . doxazosin  1 mg Oral QHS  . insulin aspart  0-9 Units Subcutaneous Q4H  . insulin aspart  3 Units  Subcutaneous TID WC  . insulin glargine  14 Units Subcutaneous QHS  . lacosamide  50 mg Oral BID  . metoCLOPramide (REGLAN) injection  5 mg Intravenous TID AC  . [START ON 02/23/2021] pantoprazole  40 mg Intravenous Q12H  . pravastatin  40 mg Oral Daily  . predniSONE  5 mg Oral Q breakfast  . Tacrolimus ER  10 mg Oral QAC breakfast  . valGANciclovir  450 mg Oral Daily   Continuous Infusions: . pantoprazole (PROTONIX) 80 mg IVPB    . promethazine (PHENERGAN) injection Stopped (02/20/21 2251)     LOS: 2 days    Time spent: 35 mins  Zannie Cove, MD [facility] P3/30/2022, 10:26 AM

## 2021-02-22 ENCOUNTER — Inpatient Hospital Stay (HOSPITAL_COMMUNITY): Payer: No Typology Code available for payment source

## 2021-02-22 ENCOUNTER — Encounter (HOSPITAL_COMMUNITY): Payer: Self-pay | Admitting: Internal Medicine

## 2021-02-22 DIAGNOSIS — R109 Unspecified abdominal pain: Secondary | ICD-10-CM

## 2021-02-22 LAB — COMPREHENSIVE METABOLIC PANEL
ALT: 17 U/L (ref 0–44)
AST: 25 U/L (ref 15–41)
Albumin: 3.5 g/dL (ref 3.5–5.0)
Alkaline Phosphatase: 64 U/L (ref 38–126)
Anion gap: 8 (ref 5–15)
BUN: 12 mg/dL (ref 6–20)
CO2: 20 mmol/L — ABNORMAL LOW (ref 22–32)
Calcium: 8.9 mg/dL (ref 8.9–10.3)
Chloride: 106 mmol/L (ref 98–111)
Creatinine, Ser: 1.2 mg/dL (ref 0.61–1.24)
GFR, Estimated: >60 mL/min (ref >60)
Glucose, Bld: 181 mg/dL — ABNORMAL HIGH (ref 70–99)
Potassium: 4 mmol/L (ref 3.5–5.1)
Sodium: 134 mmol/L — ABNORMAL LOW (ref 135–145)
Total Bilirubin: 0.9 mg/dL (ref 0.3–1.2)
Total Protein: 6.4 g/dL — ABNORMAL LOW (ref 6.5–8.1)

## 2021-02-22 LAB — GLUCOSE, CAPILLARY
Glucose-Capillary: 110 mg/dL — ABNORMAL HIGH (ref 70–99)
Glucose-Capillary: 147 mg/dL — ABNORMAL HIGH (ref 70–99)
Glucose-Capillary: 161 mg/dL — ABNORMAL HIGH (ref 70–99)
Glucose-Capillary: 188 mg/dL — ABNORMAL HIGH (ref 70–99)
Glucose-Capillary: 241 mg/dL — ABNORMAL HIGH (ref 70–99)
Glucose-Capillary: 256 mg/dL — ABNORMAL HIGH (ref 70–99)
Glucose-Capillary: 265 mg/dL — ABNORMAL HIGH (ref 70–99)

## 2021-02-22 LAB — CBC
HCT: 33.7 % — ABNORMAL LOW (ref 39.0–52.0)
Hemoglobin: 11.2 g/dL — ABNORMAL LOW (ref 13.0–17.0)
MCH: 23.9 pg — ABNORMAL LOW (ref 26.0–34.0)
MCHC: 33.2 g/dL (ref 30.0–36.0)
MCV: 72 fL — ABNORMAL LOW (ref 80.0–100.0)
Platelets: 372 10*3/uL (ref 150–400)
RBC: 4.68 MIL/uL (ref 4.22–5.81)
RDW: 17.8 % — ABNORMAL HIGH (ref 11.5–15.5)
WBC: 9.5 10*3/uL (ref 4.0–10.5)
nRBC: 0 % (ref 0.0–0.2)

## 2021-02-22 MED ORDER — HYDROMORPHONE HCL 1 MG/ML IJ SOLN
0.5000 mg | INTRAMUSCULAR | Status: DC | PRN
  Administered 2021-02-23 – 2021-02-24 (×3): 0.5 mg via INTRAVENOUS
  Filled 2021-02-22 (×3): qty 0.5

## 2021-02-22 MED ORDER — IOHEXOL 300 MG/ML  SOLN
100.0000 mL | Freq: Once | INTRAMUSCULAR | Status: AC | PRN
  Administered 2021-02-22: 100 mL via INTRAVENOUS

## 2021-02-22 MED ORDER — FENTANYL CITRATE (PF) 100 MCG/2ML IJ SOLN
25.0000 ug | Freq: Once | INTRAMUSCULAR | Status: AC
  Administered 2021-02-22: 25 ug via INTRAVENOUS
  Filled 2021-02-22: qty 2

## 2021-02-22 MED ORDER — CARVEDILOL 6.25 MG PO TABS
6.2500 mg | ORAL_TABLET | Freq: Two times a day (BID) | ORAL | Status: DC
  Administered 2021-02-22: 6.25 mg via ORAL
  Filled 2021-02-22: qty 1

## 2021-02-22 MED ORDER — METHYLPREDNISOLONE SODIUM SUCC 40 MG IJ SOLR
40.0000 mg | Freq: Two times a day (BID) | INTRAMUSCULAR | Status: AC
  Administered 2021-02-22: 40 mg via INTRAVENOUS
  Filled 2021-02-22: qty 1

## 2021-02-22 MED ORDER — PREDNISONE 20 MG PO TABS
20.0000 mg | ORAL_TABLET | Freq: Every day | ORAL | Status: DC
  Administered 2021-02-23: 20 mg via ORAL
  Filled 2021-02-22: qty 1

## 2021-02-22 NOTE — Plan of Care (Signed)
Problem: Education: Goal: Knowledge of General Education information will improve Description: Including pain rating scale, medication(s)/side effects and non-pharmacologic comfort measures 02/22/2021 2257 by Reynold Bowen, RN Outcome: Progressing 02/22/2021 2257 by Reynold Bowen, RN Outcome: Progressing   Problem: Health Behavior/Discharge Planning: Goal: Ability to manage health-related needs will improve 02/22/2021 2257 by Reynold Bowen, RN Outcome: Progressing 02/22/2021 2257 by Reynold Bowen, RN Outcome: Progressing   Problem: Clinical Measurements: Goal: Ability to maintain clinical measurements within normal limits will improve 02/22/2021 2257 by Reynold Bowen, RN Outcome: Progressing 02/22/2021 2257 by Reynold Bowen, RN Outcome: Progressing Goal: Will remain free from infection 02/22/2021 2257 by Reynold Bowen, RN Outcome: Progressing 02/22/2021 2257 by Reynold Bowen, RN Outcome: Progressing Goal: Diagnostic test results will improve 02/22/2021 2257 by Reynold Bowen, RN Outcome: Progressing 02/22/2021 2257 by Reynold Bowen, RN Outcome: Progressing Goal: Respiratory complications will improve 02/22/2021 2257 by Reynold Bowen, RN Outcome: Progressing 02/22/2021 2257 by Reynold Bowen, RN Outcome: Progressing Goal: Cardiovascular complication will be avoided 02/22/2021 2257 by Reynold Bowen, RN Outcome: Progressing 02/22/2021 2257 by Reynold Bowen, RN Outcome: Progressing   Problem: Activity: Goal: Risk for activity intolerance will decrease 02/22/2021 2257 by Reynold Bowen, RN Outcome: Progressing 02/22/2021 2257 by Reynold Bowen, RN Outcome: Progressing   Problem: Coping: Goal: Level of anxiety will decrease 02/22/2021 2257 by Reynold Bowen, RN Outcome: Progressing 02/22/2021 2257 by Reynold Bowen, RN Outcome: Progressing   Problem: Elimination: Goal: Will not experience complications related to bowel  motility 02/22/2021 2257 by Reynold Bowen, RN Outcome: Progressing 02/22/2021 2257 by Reynold Bowen, RN Outcome: Progressing Goal: Will not experience complications related to urinary retention 02/22/2021 2257 by Reynold Bowen, RN Outcome: Progressing 02/22/2021 2257 by Reynold Bowen, RN Outcome: Progressing   Problem: Safety: Goal: Ability to remain free from injury will improve 02/22/2021 2257 by Reynold Bowen, RN Outcome: Progressing 02/22/2021 2257 by Reynold Bowen, RN Outcome: Progressing   Problem: Skin Integrity: Goal: Risk for impaired skin integrity will decrease 02/22/2021 2257 by Reynold Bowen, RN Outcome: Progressing 02/22/2021 2257 by Reynold Bowen, RN Outcome: Progressing   Problem: Education: Goal: Ability to describe self-care measures that may prevent or decrease complications (Diabetes Survival Skills Education) will improve 02/22/2021 2257 by Reynold Bowen, RN Outcome: Progressing 02/22/2021 2257 by Reynold Bowen, RN Outcome: Progressing Goal: Individualized Educational Video(s) 02/22/2021 2257 by Reynold Bowen, RN Outcome: Progressing 02/22/2021 2257 by Reynold Bowen, RN Outcome: Progressing   Problem: Coping: Goal: Ability to adjust to condition or change in health will improve 02/22/2021 2257 by Reynold Bowen, RN Outcome: Progressing 02/22/2021 2257 by Reynold Bowen, RN Outcome: Progressing   Problem: Fluid Volume: Goal: Ability to maintain a balanced intake and output will improve 02/22/2021 2257 by Reynold Bowen, RN Outcome: Progressing 02/22/2021 2257 by Reynold Bowen, RN Outcome: Progressing   Problem: Health Behavior/Discharge Planning: Goal: Ability to identify and utilize available resources and services will improve 02/22/2021 2257 by Reynold Bowen, RN Outcome: Progressing 02/22/2021 2257 by Reynold Bowen, RN Outcome: Progressing Goal: Ability to manage health-related needs will  improve 02/22/2021 2257 by Reynold Bowen, RN Outcome: Progressing 02/22/2021 2257 by Reynold Bowen, RN Outcome: Progressing   Problem: Metabolic: Goal: Ability to maintain appropriate glucose levels will improve 02/22/2021 2257 by Reynold Bowen, RN Outcome: Progressing 02/22/2021 2257 by Reynold Bowen, RN  Outcome: Progressing   Problem: Nutritional: Goal: Maintenance of adequate nutrition will improve 02/22/2021 2257 by Reynold Bowen, RN Outcome: Progressing 02/22/2021 2257 by Reynold Bowen, RN Outcome: Progressing Goal: Progress toward achieving an optimal weight will improve 02/22/2021 2257 by Reynold Bowen, RN Outcome: Progressing 02/22/2021 2257 by Reynold Bowen, RN Outcome: Progressing   Problem: Skin Integrity: Goal: Risk for impaired skin integrity will decrease 02/22/2021 2257 by Reynold Bowen, RN Outcome: Progressing 02/22/2021 2257 by Reynold Bowen, RN Outcome: Progressing   Problem: Tissue Perfusion: Goal: Adequacy of tissue perfusion will improve 02/22/2021 2257 by Reynold Bowen, RN Outcome: Progressing 02/22/2021 2257 by Reynold Bowen, RN Outcome: Progressing

## 2021-02-22 NOTE — Progress Notes (Signed)
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 fib arrest on chronic immunosuppressive therapy who  presented with  2-day history of nausea and vomiting.  Currently he is living with his roommates and studies at [employer].  He has long history of gastroparesis from diabetes.  When he presented his blood sugars was in the range of 600, he was in sinus tachycardia, hypertensive, mildly hyperkalemic, creatinine of 2.38.   Patient was initially tried to be transferred to [facility] but they declined due to no need of transfer and lack of bed availability.  In the emergency department, he had an episode of hematemesis.  GI consulted and he underwent EGD.  Patient was also started on insulin drip for DKA.  Assessment & Plan:  Hematemesis/microcytic hypochromic anemia: - Presented with 2-day history of nausea and vomiting.Takes aspirin at home.  In the emergency department, he vomited dark blood with clots.  Hemoglobin was stable in the range of 11. underwent EGD by GI -EGD noted patchy gastritis which was likely the source of coffee-ground emesis/hematemesis and whitish plaques in the esophagus -Continue Protonix.   -On soft diet today.  Oral intake is poor due to nausea and intermittent vomiting -Also switched to low-dose IV Solu-Medrol instead of oral prednisone temporarily -Esophageal Path-consistent with candidiasis, discussed with pharmacy, started on anidulafungin yesterday due to interaction with tacrolimus -Having lower abdominal discomfort since last night -Will obtain CT with contrast  Diabetic ketoacidosis, diabetes  type 1, gastroparesis: Presented with nausea/vomiting.  Glucose was severely elevated on presentation, CO2 of 18, elevated anion gap.  Urinalysis was positive for ketones.   -Treated with insulin drip and IV fluids - Hemoglobin A1c 9.3.  -CBGs more stable, continue Lantus and premeal NovoLog -For gastroparesis continue scheduled Reglan and continue Phenergan as needed  SIRS/persistent sinus tachycardia: On presentation, he was tachycardic.  Patient is afebrile without any leukocytosis.  Symptoms were thought to be secondary to DKA.  -Still low-grade tachycardia persists, patient reports that his heart rate is elevated at baseline, started low-dose carvedilol  AKI:  -Due to DKA and vomiting, resolved  Hypertensive urgency:  -.  On amlodipine, doxazosin at baseline which was continued  Status post cardiac transplant: On chronic immunosuppressant therapy.  Follows at [facility].  Continue tacrolimus, prednisone per home regimen -Unable to reach patient's transplant team yesterday, will try again transplant coordinator Duanne Limerick [phone]  Prolonged QTC: QTC of 583 on admission, repeat improved, QTC was 470 this morning  Elevated lipase: Mildly elevated lipase, likely secondary to DKA, repeat was normal yesterday  Seizure disorder: On Vimpat at home.  Continued  History of CVA: Continue statin  DVT prophylaxis:SCD Code Status: Full Family Communication: No family at bedside status is: Inpatient  Remains inpatient appropriate because:Hemodynamically unstable   Dispo: The patient is from: Home              Anticipated d/c is to: Home              Patient currently is not medically stable to d/c.   Difficult to place patient No    Consultants: GI  Procedures:EGD   Impression:               -  Whitish esophageal mucosa/plaques were found,                            suspicious for candidiasis-cells for cytology                            obtained; no M-W tear was noted.                            - Patchy gastritis in the cardia; some coffee                            grounds noted but no fresh heme noted on exam.                           - Normal examined duodenum.  Antimicrobials:  Anti-infectives (From admission, onward)   Start     Dose/Rate Route Frequency Ordered Stop   02/22/21 1800  anidulafungin (ERAXIS) 50 mg in sodium chloride 0.9 % 50 mL IVPB        50 mg 78 mL/hr over 50 Minutes Intravenous Every 24 hours 02/21/21 1736 03/08/21 1759   02/21/21 1830  anidulafungin (ERAXIS) 100 mg in sodium chloride 0.9 % 100 mL IVPB        100 mg 78 mL/hr over 100 Minutes Intravenous  Once 02/21/21 1736     02/19/21 1700  valGANciclovir (VALCYTE) 450 MG tablet TABS 450 mg        450 mg Oral Daily 02/19/21 1047        Subjective: -Feels unwell and nauseated, intermittent abdominal pain  Objective: Vitals:   02/21/21 2320 02/22/21 0430 02/22/21 0637 02/22/21 0724  BP: (!) 143/94 (!) 152/94    Pulse: (!) 111 (!) 106  (!) 116  Resp: 20 16    Temp: 99.1 F (37.3 C) 98.6 F (37 C)  98.6 F (37 C)  TempSrc: Oral   Oral  SpO2: 98% 95%    Weight:   48.6 kg   Height:        Intake/Output Summary (Last 24 hours) at 02/22/2021 0954 Last data filed at 02/22/2021 0800 Gross per 24 hour  Intake 100 ml  Output 275 ml  Net -175 ml   Filed Weights   02/20/21 1650 02/21/21 0300 02/22/21 0637  Weight: 50.5 kg 50.2 kg 48.6 kg    Examination:  General exam: Pleasant chronically ill male laying in bed, uncomfortable appearing, awake alert oriented x3 HEENT: No JVD CVS: S1-S2, regular rhythm, tachycardic Lungs: Clear bilaterally Abdomen: Soft, mild epigastric tenderness, nondistended, bowel sounds present Extremities: No edema  Skin: No rashes on exposed skin  Data Reviewed: I have personally reviewed following labs and imaging studies  CBC: Recent Labs  Lab 02/19/21 0133 02/19/21 0148 02/19/21 1109 02/19/21 1650 02/21/21 0122 02/21/21 1520  02/22/21 0116  WBC 6.5  --   --   --  16.3* 10.0 9.5  NEUTROABS 5.8  --   --   --  13.1*  --   --   HGB 11.5*   < > 9.8* 7.9* 10.9* 11.0* 11.2*  HCT 34.3*   < > 30.5* 24.3* 32.7* 33.8* 33.7*  MCV 73.1*  --   --   --  72.5* 72.2* 72.0*  PLT 411*  --   --   --  418* 383 372   < > = values in this interval not displayed.   Basic Metabolic Panel: Recent Labs  Lab 02/19/21 0133 02/19/21 0148 02/20/21 0020 02/20/21 0732 02/20/21 1637 02/21/21 0122 02/22/21 0116  NA 137   < > 141 141 136 137 134*  K 5.4*   < > 3.7 3.8 3.6 3.3* 4.0  CL 100   < > 107 107 101 103 106  CO2 18*   < > 24 28 21* 26 20*  GLUCOSE 598*   < > 211* 145* 311* 65* 181*  BUN 28*   < > 16 10 8 6 12   CREATININE 2.38*   < > 1.62* 1.27* 1.40* 1.09 1.20  CALCIUM 9.2   < > 8.9 8.9 9.2 8.9 8.9  MG 1.7  --   --   --   --   --   --    < > = values in this interval not displayed.   GFR: Estimated Creatinine Clearance: 65.8 mL/min (by C-G formula based on SCr of 1.2 mg/dL). Liver Function Tests: Recent Labs  Lab 02/19/21 0133 02/22/21 0116  AST 24 25  ALT 24 17  ALKPHOS 76 64  BILITOT 1.1 0.9  PROT 7.3 6.4*  ALBUMIN 4.1 3.5   Recent Labs  Lab 02/19/21 0133 02/21/21 0122  LIPASE 83* 38   No results for input(s): AMMONIA in the last 168 hours. Coagulation Profile: No results for input(s): INR, PROTIME in the last 168 hours. Cardiac Enzymes: No results for input(s): CKTOTAL, CKMB, CKMBINDEX, TROPONINI in the last 168 hours. BNP (last 3 results) No results for input(s): PROBNP in the last 8760 hours. HbA1C: No results for input(s): HGBA1C in the last 72 hours. CBG: Recent Labs  Lab 02/21/21 2006 02/21/21 2204 02/21/21 2318 02/22/21 0346 02/22/21 0758  GLUCAP 231* 191* 203* 147* 188*   Lipid Profile: No results for input(s): CHOL, HDL, LDLCALC, TRIG, CHOLHDL, LDLDIRECT in the last 72 hours. Thyroid Function Tests: No results for input(s): TSH, T4TOTAL, FREET4, T3FREE, THYROIDAB in the last 72  hours. Anemia Panel: No results for input(s): VITAMINB12, FOLATE, FERRITIN, TIBC, IRON, RETICCTPCT in the last 72 hours. Sepsis Labs: No results for input(s): PROCALCITON, LATICACIDVEN in the last 168 hours.  Recent Results (from the past 240 hour(s))  Resp Panel by RT-PCR (Flu A&B, Covid) Nasopharyngeal Swab     Status: None   Collection Time: 02/19/21  4:23 AM   Specimen: Nasopharyngeal Swab; Nasopharyngeal(NP) swabs in vial transport medium  Result Value Ref Range Status   SARS Coronavirus 2 by RT PCR NEGATIVE NEGATIVE Final    Comment: (NOTE) SARS-CoV-2 target nucleic acids are NOT DETECTED.  The SARS-CoV-2 RNA is generally detectable in upper respiratory specimens during the acute phase of infection. The lowest concentration of SARS-CoV-2 viral copies this assay can detect is 138 copies/mL. A negative result does not preclude SARS-Cov-2 infection and should not be used as the sole basis for treatment or other patient management decisions. A negative result may occur with  improper specimen collection/handling, submission of specimen other than nasopharyngeal swab, presence of viral mutation(s) within the areas targeted by this assay, and inadequate number of viral copies(<138 copies/mL). A negative result must be combined with clinical observations, patient history, and epidemiological information. The expected result is Negative.  Fact Sheet for Patients:  02/21/21  Fact Sheet for Healthcare Providers:  BloggerCourse.com  This test is no t yet approved or cleared by the [place] FDA and  has been  authorized for detection and/or diagnosis of SARS-CoV-2 by FDA under an Emergency Use Authorization (EUA). This EUA will remain  in effect (meaning this test can be used) for the duration of the COVID-19 declaration under Section 564(b)(1) of the Act, 21 U.S.C.section 360bbb-3(b)(1), unless the authorization is  terminated  or revoked sooner.       Influenza A by PCR NEGATIVE NEGATIVE Final   Influenza B by PCR NEGATIVE NEGATIVE Final    Comment: (NOTE) The Xpert Xpress SARS-CoV-2/FLU/RSV plus assay is intended as an aid in the diagnosis of influenza from Nasopharyngeal swab specimens and should not be used as a sole basis for treatment. Nasal washings and aspirates are unacceptable for Xpert Xpress SARS-CoV-2/FLU/RSV testing.  Fact Sheet for Patients: BloggerCourse.comhttps://www.fda.gov/media/152166/download  Fact Sheet for Healthcare Providers: SeriousBroker.ithttps://www.fda.gov/media/152162/download  This test is not yet approved or cleared by the [place] FDA and has been authorized for detection and/or diagnosis of SARS-CoV-2 by FDA under an Emergency Use Authorization (EUA). This EUA will remain in effect (meaning this test can be used) for the duration of the COVID-19 declaration under Section 564(b)(1) of the Act, 21 U.S.C. section 360bbb-3(b)(1), unless the authorization is terminated or revoked.  Performed at [facility], [address] [place], [place] [address]   Culture, blood (routine x 2)     Status: None (Preliminary result)   Collection Time: 02/19/21  7:25 AM   Specimen: BLOOD RIGHT ARM  Result Value Ref Range Status   Specimen Description BLOOD RIGHT ARM  Final   Special Requests   Final    BOTTLES DRAWN AEROBIC ONLY Blood Culture results may not be optimal due to an inadequate volume of blood received in culture bottles   Culture   Final    NO GROWTH 3 DAYS Performed at [facility], [address] [place], [place] [address]    Report Status PENDING  Incomplete  Culture, blood (routine x 2)     Status: None (Preliminary result)   Collection Time: 02/19/21  7:30 AM   Specimen: BLOOD RIGHT HAND  Result Value Ref Range Status   Specimen Description BLOOD RIGHT HAND  Final   Special Requests   Final    BOTTLES DRAWN AEROBIC ONLY Blood Culture results may not be optimal  due to an inadequate volume of blood received in culture bottles   Culture   Final    NO GROWTH 3 DAYS Performed at [facility], [address] [place], [place] [address]    Report Status PENDING  Incomplete  MRSA PCR Screening     Status: None   Collection Time: 02/20/21  7:50 PM   Specimen: Nasal Mucosa; Nasopharyngeal  Result Value Ref Range Status   MRSA by PCR NEGATIVE NEGATIVE Final    Comment:        The GeneXpert MRSA Assay (FDA approved for NASAL specimens only), is one component of a comprehensive MRSA colonization surveillance program. It is not intended to diagnose MRSA infection nor to guide or monitor treatment for MRSA infections. Performed at [facility], [address] [place], [place] [address]      Scheduled Meds: . amLODipine  10 mg Oral Daily  . carvedilol  3.125 mg Oral BID WC  . doxazosin  1 mg Oral QHS  . insulin aspart  0-9 Units Subcutaneous Q4H  . insulin aspart  3 Units Subcutaneous TID WC  . insulin glargine  20 Units Subcutaneous QHS  . lacosamide  50 mg Oral BID  . methylPREDNISolone (SOLU-MEDROL) injection  40 mg Intravenous Q12H  . metoCLOPramide (REGLAN) injection  5 mg Intravenous TID AC  . [START ON 02/23/2021] pantoprazole  40 mg Intravenous Q12H  . pravastatin  40 mg Oral Daily  . sertraline  50 mg Oral Daily  . Tacrolimus ER  8 mg Oral QAC breakfast   And  . Tacrolimus ER  2 mg Oral QAC breakfast  . valGANciclovir  450 mg Oral Daily   Continuous Infusions: . anidulafungin    . anidulafungin    . promethazine (PHENERGAN) injection Stopped (02/22/21 0439)     LOS: 3 days    Time spent: 35 mins  Zannie Cove, MD [facility] P3/31/2022, 9:54 AM

## 2021-02-22 NOTE — Progress Notes (Signed)
[facility] night shift.  The nursing staff reports that the patient is currently having intense abdominal pain rated 10/10 and requesting treatment with analgesic.  He was admitted due to hematemesis secondary to hemorrhagic gastritis.  Fentanyl 25 mcg IVP x1 dose ordered.  Sanda Klein, MD.

## 2021-02-23 DIAGNOSIS — G4700 Insomnia, unspecified: Secondary | ICD-10-CM

## 2021-02-23 LAB — COMPREHENSIVE METABOLIC PANEL
ALT: 14 U/L (ref 0–44)
AST: 20 U/L (ref 15–41)
Albumin: 3.4 g/dL — ABNORMAL LOW (ref 3.5–5.0)
Alkaline Phosphatase: 64 U/L (ref 38–126)
Anion gap: 9 (ref 5–15)
BUN: 21 mg/dL — ABNORMAL HIGH (ref 6–20)
CO2: 19 mmol/L — ABNORMAL LOW (ref 22–32)
Calcium: 9 mg/dL (ref 8.9–10.3)
Chloride: 105 mmol/L (ref 98–111)
Creatinine, Ser: 1.34 mg/dL — ABNORMAL HIGH (ref 0.61–1.24)
GFR, Estimated: >60 mL/min (ref >60)
Glucose, Bld: 156 mg/dL — ABNORMAL HIGH (ref 70–99)
Potassium: 3.6 mmol/L (ref 3.5–5.1)
Sodium: 133 mmol/L — ABNORMAL LOW (ref 135–145)
Total Bilirubin: 0.8 mg/dL (ref 0.3–1.2)
Total Protein: 6.2 g/dL — ABNORMAL LOW (ref 6.5–8.1)

## 2021-02-23 LAB — GLUCOSE, CAPILLARY
Glucose-Capillary: 148 mg/dL — ABNORMAL HIGH (ref 70–99)
Glucose-Capillary: 150 mg/dL — ABNORMAL HIGH (ref 70–99)
Glucose-Capillary: 154 mg/dL — ABNORMAL HIGH (ref 70–99)
Glucose-Capillary: 186 mg/dL — ABNORMAL HIGH (ref 70–99)
Glucose-Capillary: 93 mg/dL (ref 70–99)
Glucose-Capillary: 98 mg/dL (ref 70–99)

## 2021-02-23 LAB — CBC
HCT: 34.9 % — ABNORMAL LOW (ref 39.0–52.0)
Hemoglobin: 11.9 g/dL — ABNORMAL LOW (ref 13.0–17.0)
MCH: 24.1 pg — ABNORMAL LOW (ref 26.0–34.0)
MCHC: 34.1 g/dL (ref 30.0–36.0)
MCV: 70.6 fL — ABNORMAL LOW (ref 80.0–100.0)
Platelets: 454 10*3/uL — ABNORMAL HIGH (ref 150–400)
RBC: 4.94 MIL/uL (ref 4.22–5.81)
RDW: 17.6 % — ABNORMAL HIGH (ref 11.5–15.5)
WBC: 11.6 10*3/uL — ABNORMAL HIGH (ref 4.0–10.5)
nRBC: 0 % (ref 0.0–0.2)

## 2021-02-23 MED ORDER — SUCRALFATE 1 GM/10ML PO SUSP
1.0000 g | Freq: Two times a day (BID) | ORAL | Status: DC
  Administered 2021-02-23 – 2021-02-24 (×3): 1 g via ORAL
  Filled 2021-02-23 (×3): qty 10

## 2021-02-23 MED ORDER — TRAZODONE HCL 50 MG PO TABS
25.0000 mg | ORAL_TABLET | Freq: Every evening | ORAL | Status: DC | PRN
  Administered 2021-02-24: 25 mg via ORAL
  Filled 2021-02-23: qty 1

## 2021-02-23 MED ORDER — CARVEDILOL 3.125 MG PO TABS
3.1250 mg | ORAL_TABLET | Freq: Two times a day (BID) | ORAL | Status: DC
  Administered 2021-02-23 – 2021-02-24 (×3): 3.125 mg via ORAL
  Filled 2021-02-23 (×3): qty 1

## 2021-02-23 MED ORDER — PREDNISONE 10 MG PO TABS
5.0000 mg | ORAL_TABLET | Freq: Every day | ORAL | Status: DC
  Administered 2021-02-24: 5 mg via ORAL
  Filled 2021-02-23: qty 1

## 2021-02-23 MED ORDER — SODIUM CHLORIDE 0.9 % IV SOLN: INTRAVENOUS | Status: AC

## 2021-02-23 MED ORDER — INSULIN GLARGINE 100 UNIT/ML ~~LOC~~ SOLN
15.0000 [IU] | Freq: Every day | SUBCUTANEOUS | Status: DC
  Administered 2021-02-23: 15 [IU] via SUBCUTANEOUS
  Filled 2021-02-23 (×2): qty 0.15

## 2021-02-23 NOTE — Progress Notes (Signed)
[facility] night shift.  The patient requested to the nursing staff to resume his home trazodone.   EKG tracings for the past 3 days were reviewed and they are no longer showing QT prolongation. Trazodone 25 mg p.o. nightly as needed ordered from home meds list.  Sanda Klein, MD.

## 2021-02-23 NOTE — Progress Notes (Signed)
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 fib arrest on chronic immunosuppressive therapy who  presented with  2-day history of nausea and vomiting.  Currently he is living with his roommates and studies at [employer].  He has long history of gastroparesis from diabetes.  When he presented his blood sugars was in the range of 600, he was in sinus tachycardia, hypertensive, mildly hyperkalemic, creatinine of 2.38.   Patient was initially tried to be transferred to [facility] but they declined due to no need of transfer and lack of bed availability.  In the emergency department, he had an episode of hematemesis.  GI consulted and he underwent EGD.  Patient was also started on insulin drip for DKA.  Assessment & Plan:  Nausea/Vomiting Candida Esophagitis and Gastroparesis Hematemesis/microcytic hypochromic anemia: - Presented with 2-day history of nausea and vomiting. - In the emergency department, he vomited dark blood with clots.  Hemoglobin was stable in the range of 11. underwent EGD by GI, : patchy gastritis which was likely the source of coffee-ground emesis/hematemesis and whitish plaques in the esophagus-Esophageal Path-consistent with candidiasis, discussed with pharmacy, started on anidulafungin 3/30 due to interaction with tacrolimus. -CT abd also noted distal esoph thickening -Continue Protonix.   -Continue soft diet, remains on scheduled Reglan and PRN phenergan, oral intake is minimal, few sips of liquids only, no other episode of vomiting today -add carafate, gentle IVF -d/w patient and [facility] transplant  team, requested transfer to transplant team, patient was accepted by Dr.Devore however they do not have any beds, he is on the waitlist  Diabetic ketoacidosis, diabetes type 1, gastroparesis: Presented with nausea/vomiting.  Glucose was severely elevated on presentation, CO2 of 18, elevated anion gap.  Urinalysis was positive for ketones.   -Treated with insulin drip and IV fluids - Hemoglobin A1c 9.3.  -CBGs more stable, continue Lantus and premeal NovoLog  SIRS/persistent sinus tachycardia: On presentation, he was tachycardic.  Patient is afebrile without any leukocytosis.  Symptoms were thought to be secondary to DKA.  -Still low-grade tachycardia persists, patient reports that his heart rate is elevated at baseline, started low-dose carvedilol  AKI:  -Due to DKA and vomiting, resolved  Hypertensive urgency:  -On amlodipine, doxazosin at baseline which was continued  Status post cardiac transplant: On chronic immunosuppressant therapy.  Follows at [facility].  Continue tacrolimus, prednisone per home regimen -Restarted his transplant coordinator yesterday and called [facility] transfer line  Prolonged QTC: QTC of 583 on admission, repeat improved, QTC was 470 this morning  Elevated lipase: Mildly elevated lipase, likely secondary to DKA, repeat was normal yesterday  Seizure disorder: On Vimpat at home.  Continued  History of CVA: Continue statin  DVT prophylaxis:SCD Code Status: Full Family Communication: No family at bedside status is: Inpatient  Remains inpatient appropriate because: severity of illness   Dispo: The patient is from: Home              Anticipated d/c is to: Home              Patient currently is not medically stable to d/c.   Difficult to place patient No    Consultants: GI  Procedures:EGD  Impression:               - Whitish esophageal mucosa/plaques were found,                            suspicious for candidiasis-cells for cytology                             obtained; no M-W tear was noted.                           - Patchy gastritis in the cardia; some coffee                            grounds noted but no fresh heme noted on exam.                           - Normal examined duodenum.  Antimicrobials:  Anti-infectives (From admission, onward)   Start     Dose/Rate Route Frequency Ordered Stop   02/22/21 1800  anidulafungin (ERAXIS) 50 mg in sodium chloride 0.9 % 50 mL IVPB        50 mg 78 mL/hr over 50 Minutes Intravenous Every 24 hours 02/21/21 1736 03/08/21 1759   02/21/21 1830  anidulafungin (ERAXIS) 100 mg in sodium chloride 0.9 % 100 mL IVPB  Status:  Discontinued        100 mg 78 mL/hr over 100 Minutes Intravenous  Once 02/21/21 1736 02/23/21 1035   02/19/21 1700  valGANciclovir (VALCYTE) 450 MG tablet TABS 450 mg        450 mg Oral Daily 02/19/21 1047        Subjective: -Continues to have nausea and vomiting, was barely able to eat anything yesterday, had some water and a few sips  Objective: Vitals:   02/23/21 0316 02/23/21 0321 02/23/21 0749 02/23/21 1113  BP: 120/84  105/64 (!) 148/85  Pulse: 89     Resp: 15  13   Temp: 98.1 F (36.7 C)  97.7 F (36.5 C)   TempSrc: Oral     SpO2: 98%     Weight:  48.3 kg    Height:        Intake/Output Summary (Last 24 hours) at 02/23/2021 1438 Last data filed at 02/23/2021 1000 Gross per 24 hour  Intake 312.6 ml  Output 400 ml  Net -87.4 ml   Filed Weights   02/21/21 0300 02/22/21 0637 02/23/21 0321  Weight: 50.2 kg 48.6 kg 48.3 kg    Examination:  General exam: Pleasant chronically ill laying in bed, uncomfortable appearing, AAOx3 HEENT: No JVD CVS: S1-S2, regular rhythm, tachycardic Lungs: Clear bilaterally Abdomen: Soft, mild epigastric tenderness, nondistended, bowel sounds present Extremities: No edema Skin: No rashes on exposed skin  Data Reviewed: I have personally reviewed following labs and imaging studies  CBC: Recent Labs  Lab 02/19/21 0133  02/19/21 0148 02/19/21 1650 02/21/21 0122 02/21/21 1520 02/22/21 0116 02/23/21 0009  WBC 6.5  --   --  16.3* 10.0 9.5 11.6*  NEUTROABS 5.8  --   --  13.1*  --   --   --   HGB 11.5*   < > 7.9* 10.9* 11.0* 11.2* 11.9*  HCT 34.3*   < > 24.3* 32.7* 33.8* 33.7* 34.9*  MCV 73.1*  --   --  72.5* 72.2* 72.0* 70.6*  PLT 411*  --   --  418* 383 372 454*   < > = values in this interval not displayed.   Basic Metabolic Panel: Recent Labs  Lab 02/19/21 0133 02/19/21 0148 02/20/21 0732 02/20/21 1637 02/21/21 0122 02/22/21 0116 02/23/21 0009  NA 137   < > 141 136 137 134* 133*  K 5.4*   < > 3.8 3.6 3.3* 4.0 3.6  CL 100   < > 107 101 103 106 105  CO2 18*   < > 28 21* 26 20* 19*  GLUCOSE 598*   < > 145* 311* 65* 181* 156*  BUN 28*   < > 10 8 6 12  21*  CREATININE 2.38*   < > 1.27* 1.40* 1.09 1.20 1.34*  CALCIUM 9.2   < > 8.9 9.2 8.9 8.9 9.0  MG 1.7  --   --   --   --   --   --    < > = values in this interval not displayed.   GFR: Estimated Creatinine Clearance: 58.6 mL/min (A) (by C-G formula based on SCr of 1.34 mg/dL (H)). Liver Function Tests: Recent Labs  Lab 02/19/21 0133 02/22/21 0116 02/23/21 0009  AST 24 25 20   ALT 24 17 14   ALKPHOS 76 64 64  BILITOT 1.1 0.9 0.8  PROT 7.3 6.4* 6.2*  ALBUMIN 4.1 3.5 3.4*   Recent Labs  Lab 02/19/21 0133 02/21/21 0122  LIPASE 83* 38   No results for input(s): AMMONIA in the last 168 hours. Coagulation Profile: No results for input(s): INR, PROTIME in the last 168 hours. Cardiac Enzymes: No results for input(s): CKTOTAL, CKMB, CKMBINDEX, TROPONINI in the last 168 hours. BNP (last 3 results) No results for input(s): PROBNP in the last 8760 hours. HbA1C: No results for input(s): HGBA1C in the last 72 hours. CBG: Recent Labs  Lab 02/22/21 2208 02/22/21 2356 02/23/21 0317 02/23/21 0751 02/23/21 1127  GLUCAP 241* 161* 98 93 148*   Lipid Profile: No results for input(s): CHOL, HDL, LDLCALC, TRIG, CHOLHDL, LDLDIRECT in the  last 72 hours. Thyroid Function Tests: No results for input(s): TSH, T4TOTAL, FREET4, T3FREE, THYROIDAB in the last 72 hours. Anemia Panel: No results for input(s): VITAMINB12, FOLATE, FERRITIN, TIBC, IRON, RETICCTPCT in the last 72 hours. Sepsis Labs: No results for input(s): PROCALCITON, LATICACIDVEN in the last 168 hours.  Recent Results (from the past 240 hour(s))  Resp Panel by RT-PCR (Flu A&B, Covid) Nasopharyngeal Swab     Status: None   Collection Time: 02/19/21  4:23 AM   Specimen: Nasopharyngeal Swab; Nasopharyngeal(NP) swabs in vial transport medium  Result Value Ref Range Status   SARS Coronavirus 2 by RT PCR NEGATIVE NEGATIVE Final    Comment: (NOTE) SARS-CoV-2 target nucleic acids are NOT DETECTED.  The SARS-CoV-2 RNA is generally detectable in upper respiratory specimens during the acute phase of infection. The lowest concentration of SARS-CoV-2 viral copies this assay can detect is 138 copies/mL. A negative result does not preclude SARS-Cov-2 infection and should not be used as the sole basis for treatment or other patient management decisions. A negative result may occur with  improper specimen collection/handling, submission of specimen other than nasopharyngeal swab, presence of viral mutation(s) within the areas targeted by this assay, and inadequate number of viral copies(<138 copies/mL). A negative result must be combined with clinical observations, patient history, and epidemiological information. The expected result is Negative.  Fact Sheet for Patients:  04/25/21  Fact Sheet  for Healthcare Providers:  SeriousBroker.it  This test is no t yet approved or cleared by the [place] and  has been authorized for detection and/or diagnosis of SARS-CoV-2 by FDA under an Emergency Use Authorization (EUA). This EUA will remain  in effect (meaning this test can be used) for the duration of  the COVID-19 declaration under Section 564(b)(1) of the Act, 21 U.S.C.section 360bbb-3(b)(1), unless the authorization is terminated  or revoked sooner.       Influenza A by PCR NEGATIVE NEGATIVE Final   Influenza B by PCR NEGATIVE NEGATIVE Final    Comment: (NOTE) The Xpert Xpress SARS-CoV-2/FLU/RSV plus assay is intended as an aid in the diagnosis of influenza from Nasopharyngeal swab specimens and should not be used as a sole basis for treatment. Nasal washings and aspirates are unacceptable for Xpert Xpress SARS-CoV-2/FLU/RSV testing.  Fact Sheet for Patients: BloggerCourse.com  Fact Sheet for Healthcare Providers: SeriousBroker.it  This test is not yet approved or cleared by the [place] FDA and has been authorized for detection and/or diagnosis of SARS-CoV-2 by FDA under an Emergency Use Authorization (EUA). This EUA will remain in effect (meaning this test can be used) for the duration of the COVID-19 declaration under Section 564(b)(1) of the Act, 21 U.S.C. section 360bbb-3(b)(1), unless the authorization is terminated or revoked.  Performed at [facility], [address] [place], [place] [address]   Culture, blood (routine x 2)     Status: None (Preliminary result)   Collection Time: 02/19/21  7:25 AM   Specimen: BLOOD RIGHT ARM  Result Value Ref Range Status   Specimen Description BLOOD RIGHT ARM  Final   Special Requests   Final    BOTTLES DRAWN AEROBIC ONLY Blood Culture results may not be optimal due to an inadequate volume of blood received in culture bottles   Culture   Final    NO GROWTH 3 DAYS Performed at [facility], [address] [place], [place] [address]    Report Status PENDING  Incomplete  Culture, blood (routine x 2)     Status: None (Preliminary result)   Collection Time: 02/19/21  7:30 AM   Specimen: BLOOD RIGHT HAND  Result Value Ref Range Status   Specimen Description  BLOOD RIGHT HAND  Final   Special Requests   Final    BOTTLES DRAWN AEROBIC ONLY Blood Culture results may not be optimal due to an inadequate volume of blood received in culture bottles   Culture   Final    NO GROWTH 3 DAYS Performed at [facility], [address], [place], [place] [address]    Report Status PENDING  Incomplete  MRSA PCR Screening     Status: None   Collection Time: 02/20/21  7:50 PM   Specimen: Nasal Mucosa; Nasopharyngeal  Result Value Ref Range Status   MRSA by PCR NEGATIVE NEGATIVE Final    Comment:        The GeneXpert MRSA Assay (FDA approved for NASAL specimens only), is one component of a comprehensive MRSA colonization surveillance program. It is not intended to diagnose MRSA infection nor to guide or monitor treatment for MRSA infections. Performed at [facility], [address] [place], [place] [address]      Scheduled Meds: . amLODipine  10 mg Oral Daily  . carvedilol  3.125 mg Oral BID WC  . doxazosin  1 mg Oral QHS  . insulin aspart  0-9 Units Subcutaneous  Q4H  . insulin aspart  3 Units Subcutaneous TID WC  . insulin glargine  15 Units Subcutaneous QHS  . lacosamide  50 mg Oral BID  . metoCLOPramide (REGLAN) injection  5 mg Intravenous TID AC  . pantoprazole  40 mg Intravenous Q12H  . pravastatin  40 mg Oral Daily  . predniSONE  20 mg Oral Q breakfast  . sertraline  50 mg Oral Daily  . Tacrolimus ER  8 mg Oral QAC breakfast   And  . Tacrolimus ER  2 mg Oral QAC breakfast  . valGANciclovir  450 mg Oral Daily   Continuous Infusions: . sodium chloride 50 mL/hr at 02/23/21 1301  . anidulafungin Stopped (02/22/21 1842)  . promethazine (PHENERGAN) injection Stopped (02/22/21 2225)     LOS: 4 days    Time spent: 35 mins  Zannie Cove, MD [facility] P4/11/2020, 2:38 PM

## 2021-02-23 NOTE — Plan of Care (Signed)
  Problem: Education: Goal: Knowledge of General Education information will improve Description: Including pain rating scale, medication(s)/side effects and non-pharmacologic comfort measures Outcome: Progressing   Problem: Clinical Measurements: Goal: Ability to maintain clinical measurements within normal limits will improve Outcome: Progressing   Problem: Activity: Goal: Risk for activity intolerance will decrease Outcome: Progressing   Problem: Coping: Goal: Level of anxiety will decrease Outcome: Progressing   Problem: Pain Managment: Goal: General experience of comfort will improve Outcome: Progressing   Problem: Coping: Goal: Ability to adjust to condition or change in health will improve Outcome: Progressing   Problem: Nutritional: Goal: Maintenance of adequate nutrition will improve Outcome: Progressing

## 2021-02-23 NOTE — Progress Notes (Signed)
During rounds this a.m. with the MD in room, patient agreed to at least attempt to try to eat something today. He did attempt to he lunch, taking eating about 25% of his sandwich and a few bites of fruit. Very shortly after, patient was stating 10/10 mid chest pain and upper mid abdominal pain, he states this is what has been happening when he eats. He did have some vomiting, very scant amounts. Dilaudid prn was given for pain. Continue to monitor.

## 2021-02-24 LAB — CBC
HCT: 30.2 % — ABNORMAL LOW (ref 39.0–52.0)
Hemoglobin: 10 g/dL — ABNORMAL LOW (ref 13.0–17.0)
MCH: 24.1 pg — ABNORMAL LOW (ref 26.0–34.0)
MCHC: 33.1 g/dL (ref 30.0–36.0)
MCV: 72.8 fL — ABNORMAL LOW (ref 80.0–100.0)
Platelets: 348 10*3/uL (ref 150–400)
RBC: 4.15 MIL/uL — ABNORMAL LOW (ref 4.22–5.81)
RDW: 18 % — ABNORMAL HIGH (ref 11.5–15.5)
WBC: 9.4 10*3/uL (ref 4.0–10.5)
nRBC: 0 % (ref 0.0–0.2)

## 2021-02-24 LAB — GLUCOSE, CAPILLARY
Glucose-Capillary: 83 mg/dL (ref 70–99)
Glucose-Capillary: 89 mg/dL (ref 70–99)
Glucose-Capillary: 134 mg/dL — ABNORMAL HIGH (ref 70–99)

## 2021-02-24 LAB — BASIC METABOLIC PANEL
Anion gap: 8 (ref 5–15)
BUN: 20 mg/dL (ref 6–20)
CO2: 19 mmol/L — ABNORMAL LOW (ref 22–32)
Calcium: 8.1 mg/dL — ABNORMAL LOW (ref 8.9–10.3)
Chloride: 107 mmol/L (ref 98–111)
Creatinine, Ser: 1.35 mg/dL — ABNORMAL HIGH (ref 0.61–1.24)
GFR, Estimated: >60 mL/min (ref >60)
Glucose, Bld: 96 mg/dL (ref 70–99)
Potassium: 3.3 mmol/L — ABNORMAL LOW (ref 3.5–5.1)
Sodium: 134 mmol/L — ABNORMAL LOW (ref 135–145)

## 2021-02-24 LAB — CULTURE, BLOOD (ROUTINE X 2)
Culture: NO GROWTH
Culture: NO GROWTH

## 2021-02-24 MED ORDER — PANTOPRAZOLE SODIUM 40 MG IV SOLR: 40.0000 mg | Freq: Two times a day (BID) | INTRAVENOUS | Status: DC

## 2021-02-24 MED ORDER — SODIUM CHLORIDE 0.9 % IV SOLN: INTRAVENOUS | Status: DC

## 2021-02-24 MED ORDER — SODIUM CHLORIDE 0.9 % IV SOLN: 12.5000 mg | Freq: Four times a day (QID) | INTRAVENOUS | Status: DC | PRN

## 2021-02-24 MED ORDER — METOCLOPRAMIDE HCL 5 MG/ML IJ SOLN: 5.0000 mg | Freq: Three times a day (TID) | INTRAMUSCULAR | 0 refills | Status: DC

## 2021-02-24 NOTE — Discharge Summary (Signed)
Physician Discharge Summary  Manuel Baker ZOX:[id number] DOB: 11-20-1996 DOA: 02/18/2021  PCP: Demaris Callander  [employer] [facility]  Admit date: 02/18/2021 Discharge date: 02/24/2021  Time spent: 35 minutes  Recommendations for Outpatient Follow-up:  1. Transfer to [facility] cardiac transplant service   Discharge Diagnoses:  Principal Problem:   DKA (diabetic ketoacidosis) (HCC) Candida esophagitis Diabetic gastroparesis Persistent nausea and vomiting Cardiac transplant Epstein anomaly   Seizure disorder (HCC)   Diabetic gastroparesis (HCC)   Dehydration   Hematemesis with nausea   SIRS (systemic inflammatory response syndrome) (HCC)   AKI (acute kidney injury) (HCC)   Prolonged QT interval   Discharge Condition: Stable  Diet recommendation: Diabetic  Filed Weights   02/22/21 0637 02/23/21 0321 02/24/21 0300  Weight: 48.6 kg 48.3 kg 48.9 kg    History of present illness:  25 year old chronically ill male with history of diabetes type 1 with gastroparesis, history of Libman-Sacks endocarditis, stroke, seizure disorder, 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 fib arreston chronic immunosuppressive therapy presented to the ED with  2-3-day history of nausea and vomiting.  Currently he is living with his roommates and studies at [employer].   - In ER his blood sugars was in the range of 600, with anion gap acidosis , creatinine of 2.38.   EDP Called [facility] and was asked by Dr.Kahn to admit and manage here at [facility] Course:   Nausea/Vomiting/hematemesis Candida Esophagitis and Gastroparesis - Presented with 2-3day history of nausea and vomiting, in ED he vomited dark blood with clots.  -underwent EGD 3/29 by GI, : patchy gastritis which was likely the source of coffee-ground emesis/hematemesis and whitish plaques in the esophagus-Esophageal Path-consistent with candidiasis, discussed  with pharmacy, started on anidulafungin 3/30 due to interaction of Fluconazole with tacrolimus. -CT abd also noted distal esoph thickening -Continue Protonix, started low-dose Carafate -Continues to have poor oral intake and odynophagia likely secondary to same, continue scheduled Reglan as needed Phenergan -Gentle IV fluids until p.o. intake improves -d/w patient and [facility] transplant team yesterday,, requested transfer to transplant team given poor progression despite current management, patient was accepted by Dr.Devore yesterday  Diabetic ketoacidosis, diabetes type 1, gastroparesis: Presented with nausea/vomiting.  Blood sugars in 600s with anion gap acidosis  - Urinalysis was positive for ketones.   -Treated with insulin drip and IV fluids, DKA resolved - Hemoglobin A1c 9.3.  -CBGs running lower,  stopped Premeal NovoLog and continue Lantus only  SIRS/persistent sinus tachycardia: On presentation, he was tachycardic.  Patient is afebrile without any leukocytosis.  Symptoms were thought to be secondary to DKA.  -Still low-grade tachycardia persists  AKI:  -Due to DKA and vomiting, resolved  Hypertensive urgency:  -On amlodipine, doxazosin at baseline which was continued  Status post cardiac transplant: On chronic immunosuppressant therapy. -Transplant last year, 01/2020 - Follows at [facility].  Continue tacrolimus, prednisone per home regimen  Prolonged QTC: QTC of 583 on admission, repeat improved, QTC was 470   Elevated lipase: Mildly elevated lipase, likely secondary to DKA, repeat was normal  Seizure disorder: On Vimpat at home.  Continued  History of CVA: Continue statin   EGD 3/29 Dr.Patrick Elnoria Howard   Impression: - Whitish esophageal mucosa/plaques were found,  suspicious for candidiasis-cells for cytology  obtained; no M-W tear was noted. - Patchy gastritis in the cardia;  some coffee  grounds noted but no fresh heme noted on exam. - Normal examined duodenum.  Pathology:  CYTOLOGY -  NON GYN CYTOLOGY - NON PAP  CASE: [id number]  PATIENT: Manuel Baker  Non-Gynecological Cytology Report      Clinical History: Gi bleed, r/o candida  Specimen Submitted: A. ESOPHAGEAL, BRUSHING:    FINAL MICROSCOPIC DIAGNOSIS:  - No malignant cells identified   SPECIMEN ADEQUACY:  Satisfactory for evaluation   MICROORGANISMS:  Fungal organisms present consistent with candida spp.   GROSS:  Received is/are 25cc's of clear fluid.(ES:es)  Smears: 0  Concentration Method (Thin Prep): 1  Cell Block: 0  Additional Studies: N/A      Final Diagnosis performed by Jimmy Picket, MD.  Electronically signed  02/20/2021      Discharge Exam: Vitals:   02/24/21 1155 02/24/21 1200  BP: (!) 102/56 (!) 104/59  Pulse: 76 75  Resp: 16 (!) 24  Temp: 98.1 F (36.7 C)   SpO2: 98% 99%   General exam: Pleasant thinly built chronically ill male laying in bed, AAOx3, no distress HEENT: No JVD CVS: S1-S2, regular rhythm Lungs: Clear bilaterally Abdomen: Soft, mild epigastric tenderness, nondistended, bowel sounds present Extremities: No edema  Skin: No rashes on exposed skin   Discharge Instructions   Discharge Instructions    Diet - low sodium heart healthy   Complete by: As directed    Increase activity slowly   Complete by: As directed      Allergies as of 02/24/2021      Reactions   Ibuprofen    Told not to take ibuprofen after cardiac surgery   Nsaids Other (See Comments)   Unknown reaction      Medication List    STOP taking these medications   Aspirin Low Dose 81 MG EC tablet Generic drug: aspirin   metoCLOPramide 5 MG tablet Commonly known as: REGLAN Replaced by: metoCLOPramide 5 MG/ML injection   pantoprazole 40 MG tablet Commonly known as: PROTONIX Replaced by: pantoprazole 40 MG  injection     TAKE these medications   acetaminophen 500 MG tablet Commonly known as: TYLENOL Take 500 mg by mouth every 6 (six) hours as needed for mild pain.   amLODipine 10 MG tablet Commonly known as: NORVASC Take 10 mg by mouth daily.   anidulafungin in sodium chloride 0.9 % 50 mL 50mg  IV Q24 hours   Apidra SoloStar 100 UNIT/ML Solostar Pen Generic drug: insulin glulisine Inject 0-45 Units into the skin See admin instructions. total daily dose up to 45 units  with meals and snacks   Baqsimi One Pack 3 MG/DOSE Powd Generic drug: Glucagon Place 1 spray into the nose as needed (low blood sugar).   diclofenac Sodium 1 % Gel Commonly known as: VOLTAREN Apply 2 g topically 4 (four) times daily as needed for muscle pain.   doxazosin 2 MG tablet Commonly known as: CARDURA Take 1 mg by mouth at bedtime.   INSULIN SYRINGE .3CC/31GX5/16" 31G X 5/16" 0.3 ML Misc 1 Act by Does not apply route 4 (four) times daily -  before meals and at bedtime.   metoCLOPramide 5 MG/ML injection Commonly known as: REGLAN Inject 1 mL (5 mg total) into the vein 3 (three) times daily before meals. Replaces: metoCLOPramide 5 MG tablet   ondansetron 4 MG disintegrating tablet Commonly known as: ZOFRAN-ODT Take 4 mg by mouth every 8 (eight) hours as needed for nausea/vomiting.   pantoprazole 40 MG injection Commonly known as: PROTONIX Inject 40 mg into the vein every 12 (twelve) hours. Replaces: pantoprazole 40 MG tablet   polyethylene glycol powder 17 GM/SCOOP powder  Commonly known as: GLYCOLAX/MIRALAX Take 17 g by mouth daily as needed for constipation.   pravastatin 40 MG tablet Commonly known as: PRAVACHOL Take 40 mg by mouth daily.   predniSONE 5 MG tablet Commonly known as: DELTASONE Take 5 mg by mouth daily with breakfast.   sennosides-docusate sodium 8.6-50 MG tablet Commonly known as: SENOKOT-S Take 2 tablets by mouth daily as needed for constipation.   sertraline 50 MG  tablet Commonly known as: ZOLOFT Take 50 mg by mouth daily.   sildenafil 25 MG tablet Commonly known as: VIAGRA Take by mouth.   sodium chloride 0.9 % SOLN 50 mL with promethazine 25 MG/ML SOLN 12.5 mg Inject 12.5 mg into the vein every 6 (six) hours as needed (N/VOmiting).   Envarsus XR 1 MG Tb24 Generic drug: Tacrolimus ER Take 2 mg by mouth daily before breakfast. Taking with the 8mg  = 10mg  daily   Tacrolimus ER 4 MG Tb24 Take 8 mg by mouth daily before breakfast. Taking with the 2mg  = 10mg  daily   traZODone 50 MG tablet Commonly known as: DESYREL Take 25 mg by mouth at bedtime as needed for sleep.   FlexTouch 100 UNIT/ML FlexTouch Pen Generic drug: insulin degludec Inject 14 Units into the skin at bedtime.   valGANciclovir 450 MG tablet Commonly known as: VALCYTE Take 450 mg by mouth daily.   Vimpat 50 MG Tabs tablet Generic drug: lacosamide Take 50 mg by mouth 2 (two) times daily.      Allergies  Allergen Reactions  . Ibuprofen     Told not to take ibuprofen after cardiac surgery  . Nsaids Other (See Comments)    Unknown reaction      The results of significant diagnostics from this hospitalization (including imaging, microbiology, ancillary and laboratory) are listed below for reference.    Significant Diagnostic Studies: DG Chest 2 View  Result Date: 02/19/2021 CLINICAL DATA:  Chest pain EXAM: CHEST - 2 VIEW COMPARISON:  12/02/2020 FINDINGS: The heart size and mediastinal contours are within normal limits. Both lungs are clear. The visualized skeletal structures are unremarkable. Postsurgical changes are noted. IMPRESSION: No active cardiopulmonary disease. Electronically Signed   By: M.D.   On: 02/19/2021 00:49   CT ABDOMEN PELVIS W CONTRAST  Result Date: 02/22/2021 CLINICAL DATA:  Nausea, vomiting. EXAM: CT ABDOMEN AND PELVIS WITH CONTRAST TECHNIQUE: Multidetector CT imaging of the abdomen and pelvis was performed using the standard  protocol following bolus administration of intravenous contrast. CONTRAST:  01/30/2021 OMNIPAQUE IOHEXOL 300 MG/ML  SOLN COMPARISON:  December 02, 2020. FINDINGS: Lower chest: There is continued wall thickening of the distal esophagus. Visualized lung bases are unremarkable Hepatobiliary: No focal liver abnormality is seen. No gallstones, gallbladder wall thickening, or biliary dilatation. Pancreas: Unremarkable. No pancreatic ductal dilatation or surrounding inflammatory changes. Spleen: Normal in size without focal abnormality. Adrenals/Urinary Tract: Adrenal glands are unremarkable. Kidneys are normal, without renal calculi, focal lesion, or hydronephrosis. Bladder is unremarkable. Stomach/Bowel: Stomach is within normal limits. Appendix appears normal. No evidence of bowel wall thickening, distention, or inflammatory changes. Vascular/Lymphatic: No significant vascular findings are present. No enlarged abdominal or pelvic lymph nodes. Reproductive: Prostate is unremarkable. Other: No abdominal wall hernia or abnormality. No abdominopelvic ascites. Musculoskeletal: No acute or significant osseous findings. IMPRESSION: Distal esophageal wall thickening is noted concerning for possible esophagitis. No other abnormality seen in the abdomen or pelvis. Electronically Signed   By: 02/21/2021 M.D.   On: 02/22/2021 15:27  Microbiology: Recent Results (from the past 240 hour(s))  Resp Panel by RT-PCR (Flu A&B, Covid) Nasopharyngeal Swab     Status: None   Collection Time: 02/19/21  4:23 AM   Specimen: Nasopharyngeal Swab; Nasopharyngeal(NP) swabs in vial transport medium  Result Value Ref Range Status   SARS Coronavirus 2 by RT PCR NEGATIVE NEGATIVE Final    Comment: (NOTE) SARS-CoV-2 target nucleic acids are NOT DETECTED.  The SARS-CoV-2 RNA is generally detectable in upper respiratory specimens during the acute phase of infection. The lowest concentration of SARS-CoV-2 viral copies this assay can detect  is 138 copies/mL. A negative result does not preclude SARS-Cov-2 infection and should not be used as the sole basis for treatment or other patient management decisions. A negative result may occur with  improper specimen collection/handling, submission of specimen other than nasopharyngeal swab, presence of viral mutation(s) within the areas targeted by this assay, and inadequate number of viral copies(<138 copies/mL). A negative result must be combined with clinical observations, patient history, and epidemiological information. The expected result is Negative.  Fact Sheet for Patients:  BloggerCourse.comhttps://www.fda.gov/media/152166/download  Fact Sheet for Healthcare Providers:  SeriousBroker.ithttps://www.fda.gov/media/152162/download  This test is no t yet approved or cleared by the [place] FDA and  has been authorized for detection and/or diagnosis of SARS-CoV-2 by FDA under an Emergency Use Authorization (EUA). This EUA will remain  in effect (meaning this test can be used) for the duration of the COVID-19 declaration under Section 564(b)(1) of the Act, 21 U.S.C.section 360bbb-3(b)(1), unless the authorization is terminated  or revoked sooner.       Influenza A by PCR NEGATIVE NEGATIVE Final   Influenza B by PCR NEGATIVE NEGATIVE Final    Comment: (NOTE) The Xpert Xpress SARS-CoV-2/FLU/RSV plus assay is intended as an aid in the diagnosis of influenza from Nasopharyngeal swab specimens and should not be used as a sole basis for treatment. Nasal washings and aspirates are unacceptable for Xpert Xpress SARS-CoV-2/FLU/RSV testing.  Fact Sheet for Patients: BloggerCourse.comhttps://www.fda.gov/media/152166/download  Fact Sheet for Healthcare Providers: SeriousBroker.ithttps://www.fda.gov/media/152162/download  This test is not yet approved or cleared by the [place] FDA and has been authorized for detection and/or diagnosis of SARS-CoV-2 by FDA under an Emergency Use Authorization (EUA). This EUA will remain in effect  (meaning this test can be used) for the duration of the COVID-19 declaration under Section 564(b)(1) of the Act, 21 U.S.C. section 360bbb-3(b)(1), unless the authorization is terminated or revoked.  Performed at [facility], [address] [place], [place] [address]   Culture, blood (routine x 2)     Status: None   Collection Time: 02/19/21  7:25 AM   Specimen: BLOOD RIGHT ARM  Result Value Ref Range Status   Specimen Description BLOOD RIGHT ARM  Final   Special Requests   Final    BOTTLES DRAWN AEROBIC ONLY Blood Culture results may not be optimal due to an inadequate volume of blood received in culture bottles   Culture   Final    NO GROWTH 5 DAYS Performed at [facility], [address] [place], [place] [address]    Report Status 02/24/2021 FINAL  Final  Culture, blood (routine x 2)     Status: None   Collection Time: 02/19/21  7:30 AM   Specimen: BLOOD RIGHT HAND  Result Value Ref Range Status   Specimen Description BLOOD RIGHT HAND  Final   Special Requests   Final    BOTTLES DRAWN AEROBIC ONLY Blood Culture  results may not be optimal due to an inadequate volume of blood received in culture bottles   Culture   Final    NO GROWTH 5 DAYS Performed at [facility], [address], [place], [place] [address]    Report Status 02/24/2021 FINAL  Final  MRSA PCR Screening     Status: None   Collection Time: 02/20/21  7:50 PM   Specimen: Nasal Mucosa; Nasopharyngeal  Result Value Ref Range Status   MRSA by PCR NEGATIVE NEGATIVE Final    Comment:        The GeneXpert MRSA Assay (FDA approved for NASAL specimens only), is one component of a comprehensive MRSA colonization surveillance program. It is not intended to diagnose MRSA infection nor to guide or monitor treatment for MRSA infections. Performed at [facility], [address], [place], [place] [address]      Labs: Basic Metabolic Panel: Recent Labs  Lab 02/19/21 0133 02/19/21 0148  02/20/21 1637 02/21/21 0122 02/22/21 0116 02/23/21 0009 02/24/21 0741  NA 137   < > 136 137 134* 133* 134*  K 5.4*   < > 3.6 3.3* 4.0 3.6 3.3*  CL 100   < > 101 103 106 105 107  CO2 18*   < > 21* 26 20* 19* 19*  GLUCOSE 598*   < > 311* 65* 181* 156* 96  BUN 28*   < > 8 6 12  21* 20  CREATININE 2.38*   < > 1.40* 1.09 1.20 1.34* 1.35*  CALCIUM 9.2   < > 9.2 8.9 8.9 9.0 8.1*  MG 1.7  --   --   --   --   --   --    < > = values in this interval not displayed.   Liver Function Tests: Recent Labs  Lab 02/19/21 0133 02/22/21 0116 02/23/21 0009  AST 24 25 20   ALT 24 17 14   ALKPHOS 76 64 64  BILITOT 1.1 0.9 0.8  PROT 7.3 6.4* 6.2*  ALBUMIN 4.1 3.5 3.4*   Recent Labs  Lab 02/19/21 0133 02/21/21 0122  LIPASE 83* 38   No results for input(s): AMMONIA in the last 168 hours. CBC: Recent Labs  Lab 02/19/21 0133 02/19/21 0148 02/21/21 0122 02/21/21 1520 02/22/21 0116 02/23/21 0009 02/24/21 0741  WBC 6.5  --  16.3* 10.0 9.5 11.6* 9.4  NEUTROABS 5.8  --  13.1*  --   --   --   --   HGB 11.5*   < > 10.9* 11.0* 11.2* 11.9* 10.0*  HCT 34.3*   < > 32.7* 33.8* 33.7* 34.9* 30.2*  MCV 73.1*  --  72.5* 72.2* 72.0* 70.6* 72.8*  PLT 411*  --  418* 383 372 454* 348   < > = values in this interval not displayed.   Cardiac Enzymes: No results for input(s): CKTOTAL, CKMB, CKMBINDEX, TROPONINI in the last 168 hours. BNP: BNP (last 3 results) Recent Labs    10/13/20 1100 12/02/20 2219  BNP 93.7 137.0*    ProBNP (last 3 results) No results for input(s): PROBNP in the last 8760 hours.  CBG: Recent Labs  Lab 02/23/21 1924 02/23/21 2341 02/24/21 0415 02/24/21 0759 02/24/21 1130  GLUCAP 186* 154* 83 89 134*       Signed:  04/26/21 MD.  [facility] 02/24/2021, 1:05 PM

## 2021-02-24 NOTE — Progress Notes (Signed)
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 fib arrest on chronic immunosuppressive therapy who  presented with  2-day history of nausea and vomiting.  Currently he is living with his roommates and studies at [employer].  He has long history of gastroparesis from diabetes.  When he presented his blood sugars was in the range of 600, he was in sinus tachycardia, hypertensive, mildly hyperkalemic, creatinine of 2.38.   Patient was initially tried to be transferred to [facility] but they declined due to no need of transfer and lack of bed availability.  In the emergency department, he had an episode of hematemesis.  GI consulted and he underwent EGD.  Patient was also started on insulin drip for DKA.  Assessment & Plan:  Nausea/Vomiting/hematemesis Candida Esophagitis and Gastroparesis - Presented with 2-day history of nausea and vomiting, in ED he vomited dark blood with clots.  -underwent EGD by GI, : patchy gastritis which was likely the source of coffee-ground emesis/hematemesis and whitish plaques in the esophagus-Esophageal Path-consistent with candidiasis, discussed with pharmacy, started on anidulafungin 3/30 due to interaction with tacrolimus. -CT abd also noted distal esoph thickening -Continue Protonix, started low-dose Carafate -Continues to have poor oral intake and odynophagia likely secondary to same, continue scheduled Reglan as needed Phenergan -Gentle IV fluids until p.o. intake improves -d/w patient and [facility] transplant team yesterday,, requested transfer to transplant team, patient was accepted by  Dr.Devore however they do not have any beds, he is on the waitlist -Continue current management  Diabetic ketoacidosis, diabetes type 1, gastroparesis: Presented with nausea/vomiting.  Glucose was severely elevated on presentation, CO2 of 18, elevated anion gap.  Urinalysis was positive for ketones.   -Treated with insulin drip and IV fluids - Hemoglobin A1c 9.3.  -CBGs running lower,  will stop Premeal NovoLog and continue Lantus only  SIRS/persistent sinus tachycardia: On presentation, he was tachycardic.  Patient is afebrile without any leukocytosis.  Symptoms were thought to be secondary to DKA.  -Still low-grade tachycardia persists, patient reports that his heart rate is elevated at baseline, started low-dose carvedilol  AKI:  -Due to DKA and vomiting, resolved  Hypertensive urgency:  -On amlodipine, doxazosin at baseline which was continued  Status post cardiac transplant: On chronic immunosuppressant therapy.  Follows at [facility].  Continue tacrolimus, prednisone per home regimen -Restarted his transplant coordinator yesterday and called [facility] transfer line  Prolonged QTC: QTC of 583 on admission, repeat improved, QTC was 470   Elevated lipase: Mildly elevated lipase, likely secondary to DKA, repeat was normal  Seizure disorder: On Vimpat at home.  Continued  History of CVA: Continue statin  DVT prophylaxis:SCD Code Status: Full Family Communication: No family at bedside status is: Inpatient  Remains inpatient appropriate because: severity of illness   Dispo: The patient is from: Home              Anticipated d/c is to: Home versus [facility] transfer              Patient currently is not medically stable to d/c.   Difficult to place patient No    Consultants: GI  Procedures:EGD   Impression:               -  Whitish esophageal mucosa/plaques were found,                            suspicious for candidiasis-cells for cytology                            obtained; no M-W  tear was noted.                           - Patchy gastritis in the cardia; some coffee                            grounds noted but no fresh heme noted on exam.                           - Normal examined duodenum.  Antimicrobials:  Anti-infectives (From admission, onward)   Start     Dose/Rate Route Frequency Ordered Stop   02/22/21 1800  anidulafungin (ERAXIS) 50 mg in sodium chloride 0.9 % 50 mL IVPB        50 mg 78 mL/hr over 50 Minutes Intravenous Every 24 hours 02/21/21 1736 03/08/21 1759   02/21/21 1830  anidulafungin (ERAXIS) 100 mg in sodium chloride 0.9 % 100 mL IVPB  Status:  Discontinued        100 mg 78 mL/hr over 100 Minutes Intravenous  Once 02/21/21 1736 02/23/21 1035   02/19/21 1700  valGANciclovir (VALCYTE) 450 MG tablet TABS 450 mg        450 mg Oral Daily 02/19/21 1047        Subjective: -Continues to have pain upon swallowing, and nausea, one episode of vomiting yesterday afternoon, none since then Objective: Vitals:   02/23/21 2300 02/24/21 0300 02/24/21 0629 02/24/21 0840  BP: 126/78 122/77 118/72 113/73  Pulse: 89 (!) 106 81 81  Resp: (!) 23 16  11   Temp: 98.7 F (37.1 C) 99.1 F (37.3 C)  98 F (36.7 C)  TempSrc: Oral Oral  Oral  SpO2: 100% 99%  100%  Weight:  48.9 kg    Height:        Intake/Output Summary (Last 24 hours) at 02/24/2021 1125 Last data filed at 02/24/2021 0900 Gross per 24 hour  Intake 1846.37 ml  Output 300 ml  Net 1546.37 ml   Filed Weights   02/22/21 0637 02/23/21 0321 02/24/21 0300  Weight: 48.6 kg 48.3 kg 48.9 kg    Examination:  General exam: Pleasant thinly built chronically ill male laying in bed, AAOx3, no distress HEENT: No JVD CVS: S1-S2, regular rhythm Lungs: Clear bilaterally Abdomen: Soft, mild epigastric tenderness, nondistended, bowel sounds present Extremities: No edema  Skin: No rashes on exposed skin  Data Reviewed: I have personally reviewed following labs and imaging studies  CBC: Recent Labs   Lab 02/19/21 0133 02/19/21 0148 02/21/21 0122 02/21/21 1520 02/22/21 0116 02/23/21 0009 02/24/21 0741  WBC 6.5  --  16.3* 10.0 9.5 11.6* 9.4  NEUTROABS 5.8  --  13.1*  --   --   --   --   HGB 11.5*   < > 10.9* 11.0* 11.2* 11.9* 10.0*  HCT 34.3*   < > 32.7* 33.8* 33.7* 34.9* 30.2*  MCV 73.1*  --  72.5* 72.2* 72.0* 70.6* 72.8*  PLT 411*  --  418*  383 372 454* 348   < > = values in this interval not displayed.   Basic Metabolic Panel: Recent Labs  Lab 02/19/21 0133 02/19/21 0148 02/20/21 1637 02/21/21 0122 02/22/21 0116 02/23/21 0009 02/24/21 0741  NA 137   < > 136 137 134* 133* 134*  K 5.4*   < > 3.6 3.3* 4.0 3.6 3.3*  CL 100   < > 101 103 106 105 107  CO2 18*   < > 21* 26 20* 19* 19*  GLUCOSE 598*   < > 311* 65* 181* 156* 96  BUN 28*   < > 8 6 12  21* 20  CREATININE 2.38*   < > 1.40* 1.09 1.20 1.34* 1.35*  CALCIUM 9.2   < > 9.2 8.9 8.9 9.0 8.1*  MG 1.7  --   --   --   --   --   --    < > = values in this interval not displayed.   GFR: Estimated Creatinine Clearance: 58.9 mL/min (A) (by C-G formula based on SCr of 1.35 mg/dL (H)). Liver Function Tests: Recent Labs  Lab 02/19/21 0133 02/22/21 0116 02/23/21 0009  AST 24 25 20   ALT 24 17 14   ALKPHOS 76 64 64  BILITOT 1.1 0.9 0.8  PROT 7.3 6.4* 6.2*  ALBUMIN 4.1 3.5 3.4*   Recent Labs  Lab 02/19/21 0133 02/21/21 0122  LIPASE 83* 38   No results for input(s): AMMONIA in the last 168 hours. Coagulation Profile: No results for input(s): INR, PROTIME in the last 168 hours. Cardiac Enzymes: No results for input(s): CKTOTAL, CKMB, CKMBINDEX, TROPONINI in the last 168 hours. BNP (last 3 results) No results for input(s): PROBNP in the last 8760 hours. HbA1C: No results for input(s): HGBA1C in the last 72 hours. CBG: Recent Labs  Lab 02/23/21 1605 02/23/21 1924 02/23/21 2341 02/24/21 0415 02/24/21 0759  GLUCAP 150* 186* 154* 83 89   Lipid Profile: No results for input(s): CHOL, HDL, LDLCALC, TRIG,  CHOLHDL, LDLDIRECT in the last 72 hours. Thyroid Function Tests: No results for input(s): TSH, T4TOTAL, FREET4, T3FREE, THYROIDAB in the last 72 hours. Anemia Panel: No results for input(s): VITAMINB12, FOLATE, FERRITIN, TIBC, IRON, RETICCTPCT in the last 72 hours. Sepsis Labs: No results for input(s): PROCALCITON, LATICACIDVEN in the last 168 hours.  Recent Results (from the past 240 hour(s))  Resp Panel by RT-PCR (Flu A&B, Covid) Nasopharyngeal Swab     Status: None   Collection Time: 02/19/21  4:23 AM   Specimen: Nasopharyngeal Swab; Nasopharyngeal(NP) swabs in vial transport medium  Result Value Ref Range Status   SARS Coronavirus 2 by RT PCR NEGATIVE NEGATIVE Final    Comment: (NOTE) SARS-CoV-2 target nucleic acids are NOT DETECTED.  The SARS-CoV-2 RNA is generally detectable in upper respiratory specimens during the acute phase of infection. The lowest concentration of SARS-CoV-2 viral copies this assay can detect is 138 copies/mL. A negative result does not preclude SARS-Cov-2 infection and should not be used as the sole basis for treatment or other patient management decisions. A negative result may occur with  improper specimen collection/handling, submission of specimen other than nasopharyngeal swab, presence of viral mutation(s) within the areas targeted by this assay, and inadequate number of viral copies(<138 copies/mL). A negative result must be combined with clinical observations, patient history, and epidemiological information. The expected result is Negative.  Fact Sheet for Patients:  04/26/21  Fact Sheet for Healthcare Providers:  04/26/21  This test is no t yet approved  or cleared by the [place] and  has been authorized for detection and/or diagnosis of SARS-CoV-2 by FDA under an Emergency Use Authorization (EUA). This EUA will remain  in effect (meaning this test can be used) for  the duration of the COVID-19 declaration under Section 564(b)(1) of the Act, 21 U.S.C.section 360bbb-3(b)(1), unless the authorization is terminated  or revoked sooner.       Influenza A by PCR NEGATIVE NEGATIVE Final   Influenza B by PCR NEGATIVE NEGATIVE Final    Comment: (NOTE) The Xpert Xpress SARS-CoV-2/FLU/RSV plus assay is intended as an aid in the diagnosis of influenza from Nasopharyngeal swab specimens and should not be used as a sole basis for treatment. Nasal washings and aspirates are unacceptable for Xpert Xpress SARS-CoV-2/FLU/RSV testing.  Fact Sheet for Patients: BloggerCourse.com  Fact Sheet for Healthcare Providers: SeriousBroker.it  This test is not yet approved or cleared by the [place] FDA and has been authorized for detection and/or diagnosis of SARS-CoV-2 by FDA under an Emergency Use Authorization (EUA). This EUA will remain in effect (meaning this test can be used) for the duration of the COVID-19 declaration under Section 564(b)(1) of the Act, 21 U.S.C. section 360bbb-3(b)(1), unless the authorization is terminated or revoked.  Performed at [facility], [address], [place], [place] [address]   Culture, blood (routine x 2)     Status: None   Collection Time: 02/19/21  7:25 AM   Specimen: BLOOD RIGHT ARM  Result Value Ref Range Status   Specimen Description BLOOD RIGHT ARM  Final   Special Requests   Final    BOTTLES DRAWN AEROBIC ONLY Blood Culture results may not be optimal due to an inadequate volume of blood received in culture bottles   Culture   Final    NO GROWTH 5 DAYS Performed at [facility], [address], [place], [place] [address]    Report Status 02/24/2021 FINAL  Final  Culture, blood (routine x 2)     Status: None   Collection Time: 02/19/21  7:30 AM   Specimen: BLOOD RIGHT HAND  Result Value Ref Range Status   Specimen Description BLOOD RIGHT HAND  Final    Special Requests   Final    BOTTLES DRAWN AEROBIC ONLY Blood Culture results may not be optimal due to an inadequate volume of blood received in culture bottles   Culture   Final    NO GROWTH 5 DAYS Performed at [facility], [address], [place], [place] [address]    Report Status 02/24/2021 FINAL  Final  MRSA PCR Screening     Status: None   Collection Time: 02/20/21  7:50 PM   Specimen: Nasal Mucosa; Nasopharyngeal  Result Value Ref Range Status   MRSA by PCR NEGATIVE NEGATIVE Final    Comment:        The GeneXpert MRSA Assay (FDA approved for NASAL specimens only), is one component of a comprehensive MRSA colonization surveillance program. It is not intended to diagnose MRSA infection nor to guide or monitor treatment for MRSA infections. Performed at [facility], [address], [place], [place] [address]      Scheduled Meds: . amLODipine  10 mg Oral Daily  . carvedilol  3.125 mg Oral BID WC  . doxazosin  1 mg Oral QHS  . insulin aspart  0-9 Units Subcutaneous Q4H  . insulin aspart  3 Units Subcutaneous TID WC  . insulin glargine  15 Units Subcutaneous QHS  . lacosamide  50 mg Oral BID  . metoCLOPramide (REGLAN) injection  5 mg Intravenous TID AC  . pantoprazole  40 mg Intravenous Q12H  . pravastatin  40 mg Oral Daily  . predniSONE  5 mg Oral Q breakfast  . sertraline  50 mg Oral Daily  . sucralfate  1 g Oral BID  . Tacrolimus ER  8 mg Oral QAC breakfast   And  . Tacrolimus ER  2 mg Oral QAC breakfast  . valGANciclovir  450 mg Oral Daily   Continuous Infusions: . anidulafungin 50 mg (02/23/21 1852)  . promethazine (PHENERGAN) injection 12.5 mg (02/24/21 0502)     LOS: 5 days    Time spent: 35 mins  Zannie Cove, MD [facility] P4/12/2020, 11:25 AM

## 2021-02-24 NOTE — Progress Notes (Addendum)
Patient has a bed at [facility], larger [facility] facility at [address], [place], tele [phone], reported to me from Levada Schilling at [facility], called report to [facility] at [facility] arranged with [facility] transport, pick up time appx 3:30, patient advised, he will advise his family, I have talked with Dr Jomarie Longs to advise and also notified CM, Heather.---patient room # is 7110--patient leaving [facility] with RFA IV access and continuing NS@50 .

## 2021-03-27 ENCOUNTER — Ambulatory Visit: Payer: Self-pay | Admitting: Student

## 2021-03-27 ENCOUNTER — Other Ambulatory Visit: Payer: Self-pay | Admitting: Orthopedic Surgery

## 2021-03-27 DIAGNOSIS — S82121A Displaced fracture of lateral condyle of right tibia, initial encounter for closed fracture: Secondary | ICD-10-CM | POA: Insufficient documentation

## 2021-03-27 DIAGNOSIS — M25561 Pain in right knee: Secondary | ICD-10-CM

## 2021-03-28 ENCOUNTER — Inpatient Hospital Stay (HOSPITAL_COMMUNITY)
Admission: EM | Admit: 2021-03-28 | Discharge: 2021-04-03 | DRG: 981 | Disposition: A | Discharge status: Home / Self Care | Payer: No Typology Code available for payment source | Attending: Internal Medicine | Admitting: Internal Medicine

## 2021-03-28 ENCOUNTER — Ambulatory Visit
Admission: RE | Admit: 2021-03-28 | Discharge: 2021-03-28 | Disposition: A | Discharge status: Home / Self Care | Payer: Medicaid Other | Source: Ambulatory Visit | Attending: Orthopedic Surgery | Admitting: Orthopedic Surgery

## 2021-03-28 ENCOUNTER — Other Ambulatory Visit (HOSPITAL_COMMUNITY)
Admission: RE | Admit: 2021-03-28 | Discharge: 2021-03-28 | Disposition: A | Discharge status: Home / Self Care | Payer: No Typology Code available for payment source | Source: Ambulatory Visit | Attending: Student | Admitting: Student

## 2021-03-28 ENCOUNTER — Other Ambulatory Visit: Payer: Self-pay

## 2021-03-28 ENCOUNTER — Encounter (HOSPITAL_COMMUNITY): Payer: Self-pay | Admitting: Student

## 2021-03-28 DIAGNOSIS — E1043 Type 1 diabetes mellitus with diabetic autonomic (poly)neuropathy: Secondary | ICD-10-CM | POA: Diagnosis present

## 2021-03-28 DIAGNOSIS — E559 Vitamin D deficiency, unspecified: Secondary | ICD-10-CM | POA: Diagnosis present

## 2021-03-28 DIAGNOSIS — W109XXA Fall (on) (from) unspecified stairs and steps, initial encounter: Secondary | ICD-10-CM | POA: Diagnosis present

## 2021-03-28 DIAGNOSIS — N179 Acute kidney failure, unspecified: Secondary | ICD-10-CM | POA: Diagnosis present

## 2021-03-28 DIAGNOSIS — Z01818 Encounter for other preprocedural examination: Secondary | ICD-10-CM

## 2021-03-28 DIAGNOSIS — E876 Hypokalemia: Secondary | ICD-10-CM | POA: Diagnosis not present

## 2021-03-28 DIAGNOSIS — S82121A Displaced fracture of lateral condyle of right tibia, initial encounter for closed fracture: Secondary | ICD-10-CM

## 2021-03-28 DIAGNOSIS — D638 Anemia in other chronic diseases classified elsewhere: Secondary | ICD-10-CM | POA: Diagnosis present

## 2021-03-28 DIAGNOSIS — E43 Unspecified severe protein-calorie malnutrition: Secondary | ICD-10-CM | POA: Diagnosis present

## 2021-03-28 DIAGNOSIS — Z941 Heart transplant status: Secondary | ICD-10-CM

## 2021-03-28 DIAGNOSIS — Z79899 Other long term (current) drug therapy: Secondary | ICD-10-CM

## 2021-03-28 DIAGNOSIS — I110 Hypertensive heart disease with heart failure: Secondary | ICD-10-CM | POA: Diagnosis present

## 2021-03-28 DIAGNOSIS — Z01812 Encounter for preprocedural laboratory examination: Secondary | ICD-10-CM | POA: Insufficient documentation

## 2021-03-28 DIAGNOSIS — D84821 Immunodeficiency due to drugs: Secondary | ICD-10-CM | POA: Diagnosis present

## 2021-03-28 DIAGNOSIS — F419 Anxiety disorder, unspecified: Secondary | ICD-10-CM | POA: Diagnosis present

## 2021-03-28 DIAGNOSIS — Z8673 Personal history of transient ischemic attack (TIA), and cerebral infarction without residual deficits: Secondary | ICD-10-CM | POA: Diagnosis not present

## 2021-03-28 DIAGNOSIS — M79604 Pain in right leg: Secondary | ICD-10-CM | POA: Diagnosis not present

## 2021-03-28 DIAGNOSIS — R531 Weakness: Secondary | ICD-10-CM | POA: Diagnosis present

## 2021-03-28 DIAGNOSIS — I5032 Chronic diastolic (congestive) heart failure: Secondary | ICD-10-CM | POA: Diagnosis present

## 2021-03-28 DIAGNOSIS — S82831A Other fracture of upper and lower end of right fibula, initial encounter for closed fracture: Secondary | ICD-10-CM | POA: Diagnosis present

## 2021-03-28 DIAGNOSIS — K3184 Gastroparesis: Secondary | ICD-10-CM | POA: Diagnosis present

## 2021-03-28 DIAGNOSIS — Z833 Family history of diabetes mellitus: Secondary | ICD-10-CM

## 2021-03-28 DIAGNOSIS — Z681 Body mass index (BMI) 19 or less, adult: Secondary | ICD-10-CM

## 2021-03-28 DIAGNOSIS — G40909 Epilepsy, unspecified, not intractable, without status epilepticus: Secondary | ICD-10-CM | POA: Diagnosis present

## 2021-03-28 DIAGNOSIS — E1010 Type 1 diabetes mellitus with ketoacidosis without coma: Principal | ICD-10-CM

## 2021-03-28 DIAGNOSIS — D62 Acute posthemorrhagic anemia: Secondary | ICD-10-CM | POA: Diagnosis not present

## 2021-03-28 DIAGNOSIS — Z419 Encounter for procedure for purposes other than remedying health state, unspecified: Secondary | ICD-10-CM

## 2021-03-28 DIAGNOSIS — K5900 Constipation, unspecified: Secondary | ICD-10-CM | POA: Diagnosis not present

## 2021-03-28 DIAGNOSIS — M25561 Pain in right knee: Secondary | ICD-10-CM

## 2021-03-28 DIAGNOSIS — K219 Gastro-esophageal reflux disease without esophagitis: Secondary | ICD-10-CM | POA: Diagnosis present

## 2021-03-28 DIAGNOSIS — S82141A Displaced bicondylar fracture of right tibia, initial encounter for closed fracture: Secondary | ICD-10-CM

## 2021-03-28 DIAGNOSIS — Z20822 Contact with and (suspected) exposure to covid-19: Secondary | ICD-10-CM | POA: Insufficient documentation

## 2021-03-28 DIAGNOSIS — S82144A Nondisplaced bicondylar fracture of right tibia, initial encounter for closed fracture: Secondary | ICD-10-CM | POA: Diagnosis present

## 2021-03-28 DIAGNOSIS — B370 Candidal stomatitis: Secondary | ICD-10-CM | POA: Diagnosis not present

## 2021-03-28 DIAGNOSIS — T148XXA Other injury of unspecified body region, initial encounter: Secondary | ICD-10-CM

## 2021-03-28 DIAGNOSIS — Z794 Long term (current) use of insulin: Secondary | ICD-10-CM

## 2021-03-28 DIAGNOSIS — E785 Hyperlipidemia, unspecified: Secondary | ICD-10-CM | POA: Diagnosis present

## 2021-03-28 DIAGNOSIS — F32A Depression, unspecified: Secondary | ICD-10-CM | POA: Diagnosis present

## 2021-03-28 DIAGNOSIS — Z7952 Long term (current) use of systemic steroids: Secondary | ICD-10-CM

## 2021-03-28 DIAGNOSIS — Z886 Allergy status to analgesic agent status: Secondary | ICD-10-CM

## 2021-03-28 DIAGNOSIS — Z7982 Long term (current) use of aspirin: Secondary | ICD-10-CM

## 2021-03-28 DIAGNOSIS — E0810 Diabetes mellitus due to underlying condition with ketoacidosis without coma: Secondary | ICD-10-CM

## 2021-03-28 LAB — CBC
HCT: 27.9 % — ABNORMAL LOW (ref 39.0–52.0)
Hemoglobin: 9.3 g/dL — ABNORMAL LOW (ref 13.0–17.0)
MCH: 23.1 pg — ABNORMAL LOW (ref 26.0–34.0)
MCHC: 33.3 g/dL (ref 30.0–36.0)
MCV: 69.4 fL — ABNORMAL LOW (ref 80.0–100.0)
Platelets: 460 10*3/uL — ABNORMAL HIGH (ref 150–400)
RBC: 4.02 MIL/uL — ABNORMAL LOW (ref 4.22–5.81)
RDW: 16.6 % — ABNORMAL HIGH (ref 11.5–15.5)
WBC: 6.7 10*3/uL (ref 4.0–10.5)
nRBC: 0 % (ref 0.0–0.2)

## 2021-03-28 LAB — URINALYSIS, ROUTINE W REFLEX MICROSCOPIC
Bacteria, UA: NONE SEEN
Bilirubin Urine: NEGATIVE
Glucose, UA: >=500 mg/dL — AB
Ketones, ur: 5 mg/dL — AB
Leukocytes,Ua: NEGATIVE
Nitrite: NEGATIVE
Protein, ur: NEGATIVE mg/dL
Specific Gravity, Urine: 1.018 (ref 1.005–1.030)
pH: 8 (ref 5.0–8.0)

## 2021-03-28 LAB — BASIC METABOLIC PANEL
Anion gap: 9 (ref 5–15)
BUN: 20 mg/dL (ref 6–20)
CO2: 25 mmol/L (ref 22–32)
Calcium: 8.8 mg/dL — ABNORMAL LOW (ref 8.9–10.3)
Chloride: 101 mmol/L (ref 98–111)
Creatinine, Ser: 2.03 mg/dL — ABNORMAL HIGH (ref 0.61–1.24)
GFR, Estimated: 46 mL/min — ABNORMAL LOW (ref >60)
Glucose, Bld: 545 mg/dL (ref 70–99)
Potassium: 4 mmol/L (ref 3.5–5.1)
Sodium: 135 mmol/L (ref 135–145)

## 2021-03-28 LAB — RAPID URINE DRUG SCREEN, HOSP PERFORMED
Amphetamines: NOT DETECTED
Barbiturates: NOT DETECTED
Benzodiazepines: NOT DETECTED
Cocaine: NOT DETECTED
Opiates: NOT DETECTED
Tetrahydrocannabinol: POSITIVE — AB

## 2021-03-28 LAB — I-STAT VENOUS BLOOD GAS, ED
Acid-Base Excess: 2 mmol/L (ref 0.0–2.0)
Bicarbonate: 23.4 mmol/L (ref 20.0–28.0)
Calcium, Ion: 1.04 mmol/L — ABNORMAL LOW (ref 1.15–1.40)
HCT: 30 % — ABNORMAL LOW (ref 39.0–52.0)
Hemoglobin: 10.2 g/dL — ABNORMAL LOW (ref 13.0–17.0)
O2 Saturation: 97 %
Potassium: 4.2 mmol/L (ref 3.5–5.1)
Sodium: 127 mmol/L — ABNORMAL LOW (ref 135–145)
TCO2: 24 mmol/L (ref 22–32)
pCO2, Ven: 25.6 mmHg — ABNORMAL LOW (ref 44.0–60.0)
pH, Ven: 7.57 — ABNORMAL HIGH (ref 7.250–7.430)
pO2, Ven: 72 mmHg — ABNORMAL HIGH (ref 32.0–45.0)

## 2021-03-28 LAB — LIPASE, BLOOD: Lipase: 31 U/L (ref 11–51)

## 2021-03-28 LAB — COMPREHENSIVE METABOLIC PANEL
ALT: 55 U/L — ABNORMAL HIGH (ref 0–44)
AST: 53 U/L — ABNORMAL HIGH (ref 15–41)
Albumin: 3.8 g/dL (ref 3.5–5.0)
Alkaline Phosphatase: 141 U/L — ABNORMAL HIGH (ref 38–126)
Anion gap: 18 — ABNORMAL HIGH (ref 5–15)
BUN: 25 mg/dL — ABNORMAL HIGH (ref 6–20)
CO2: 19 mmol/L — ABNORMAL LOW (ref 22–32)
Calcium: 9.6 mg/dL (ref 8.9–10.3)
Chloride: 87 mmol/L — ABNORMAL LOW (ref 98–111)
Creatinine, Ser: 2.24 mg/dL — ABNORMAL HIGH (ref 0.61–1.24)
GFR, Estimated: 41 mL/min — ABNORMAL LOW (ref >60)
Glucose, Bld: 1185 mg/dL (ref 70–99)
Potassium: 4.3 mmol/L (ref 3.5–5.1)
Sodium: 124 mmol/L — ABNORMAL LOW (ref 135–145)
Total Bilirubin: 1.1 mg/dL (ref 0.3–1.2)
Total Protein: 7.8 g/dL (ref 6.5–8.1)

## 2021-03-28 LAB — CBG MONITORING, ED
Glucose-Capillary: >600 mg/dL (ref 70–99)
Glucose-Capillary: 529 mg/dL (ref 70–99)
Glucose-Capillary: >600 mg/dL (ref 70–99)
Glucose-Capillary: 362 mg/dL — ABNORMAL HIGH (ref 70–99)

## 2021-03-28 LAB — IRON AND TIBC
Iron: 36 ug/dL — ABNORMAL LOW (ref 45–182)
Saturation Ratios: 10 % — ABNORMAL LOW (ref 17.9–39.5)
TIBC: 357 ug/dL (ref 250–450)
UIBC: 321 ug/dL

## 2021-03-28 LAB — SARS CORONAVIRUS 2 (TAT 6-24 HRS): SARS Coronavirus 2: NEGATIVE

## 2021-03-28 LAB — BETA-HYDROXYBUTYRIC ACID: Beta-Hydroxybutyric Acid: 2.73 mmol/L — ABNORMAL HIGH (ref 0.05–0.27)

## 2021-03-28 LAB — FERRITIN: Ferritin: 81 ng/mL (ref 24–336)

## 2021-03-28 LAB — VITAMIN B12: Vitamin B-12: 618 pg/mL (ref 180–914)

## 2021-03-28 LAB — FOLATE: Folate: 12.5 ng/mL (ref >5.9)

## 2021-03-28 MED ORDER — DEXTROSE IN LACTATED RINGERS 5 % IV SOLN: INTRAVENOUS | Status: DC

## 2021-03-28 MED ORDER — PREDNISONE 5 MG PO TABS
2.5000 mg | ORAL_TABLET | Freq: Every day | ORAL | Status: DC
  Administered 2021-03-30 – 2021-04-03 (×5): 2.5 mg via ORAL
  Filled 2021-03-28 (×6): qty 1

## 2021-03-28 MED ORDER — LACTATED RINGERS IV BOLUS
20.0000 mL/kg | Freq: Once | INTRAVENOUS | Status: AC
  Administered 2021-03-28: 948 mL via INTRAVENOUS

## 2021-03-28 MED ORDER — AMLODIPINE BESYLATE 10 MG PO TABS
10.0000 mg | ORAL_TABLET | Freq: Every day | ORAL | Status: DC
  Administered 2021-03-29 – 2021-04-03 (×6): 10 mg via ORAL
  Filled 2021-03-28: qty 1
  Filled 2021-03-28: qty 2
  Filled 2021-03-28 (×4): qty 1

## 2021-03-28 MED ORDER — HYDROMORPHONE HCL 1 MG/ML IJ SOLN
0.5000 mg | INTRAMUSCULAR | Status: DC | PRN
  Administered 2021-03-29 – 2021-03-30 (×3): 0.5 mg via INTRAVENOUS
  Filled 2021-03-28: qty 1
  Filled 2021-03-28 (×3): qty 0.5

## 2021-03-28 MED ORDER — MELATONIN 3 MG PO TABS
3.0000 mg | ORAL_TABLET | Freq: Every evening | ORAL | Status: DC | PRN
  Administered 2021-03-30 – 2021-04-02 (×4): 3 mg via ORAL
  Filled 2021-03-28 (×6): qty 1

## 2021-03-28 MED ORDER — LACTATED RINGERS IV SOLN: INTRAVENOUS | Status: DC

## 2021-03-28 MED ORDER — DEXTROSE 50 % IV SOLN
0.0000 mL | INTRAVENOUS | Status: DC | PRN
Start: 2021-03-28 — End: 2021-04-03

## 2021-03-28 MED ORDER — POTASSIUM CHLORIDE 10 MEQ/100ML IV SOLN
10.0000 meq | INTRAVENOUS | Status: AC
  Administered 2021-03-28 (×2): 10 meq via INTRAVENOUS
  Filled 2021-03-28 (×2): qty 100

## 2021-03-28 MED ORDER — POTASSIUM CHLORIDE 10 MEQ/100ML IV SOLN: 10.0000 meq | INTRAVENOUS | Status: DC

## 2021-03-28 MED ORDER — LACTATED RINGERS IV BOLUS: 20.0000 mL/kg | Freq: Once | INTRAVENOUS | Status: DC

## 2021-03-28 MED ORDER — VALGANCICLOVIR HCL 450 MG PO TABS
900.0000 mg | ORAL_TABLET | Freq: Every day | ORAL | Status: DC
  Administered 2021-03-29 – 2021-04-02 (×5): 900 mg via ORAL
  Filled 2021-03-28 (×7): qty 2

## 2021-03-28 MED ORDER — LACOSAMIDE 50 MG PO TABS
50.0000 mg | ORAL_TABLET | Freq: Two times a day (BID) | ORAL | Status: DC
  Administered 2021-03-29 – 2021-04-03 (×12): 50 mg via ORAL
  Filled 2021-03-28 (×12): qty 1

## 2021-03-28 MED ORDER — INSULIN REGULAR(HUMAN) IN NACL 100-0.9 UT/100ML-% IV SOLN
INTRAVENOUS | Status: DC
  Administered 2021-03-28: 4.8 [IU]/h via INTRAVENOUS
  Filled 2021-03-28: qty 100

## 2021-03-28 MED ORDER — ACETAMINOPHEN 325 MG PO TABS
650.0000 mg | ORAL_TABLET | Freq: Four times a day (QID) | ORAL | Status: DC | PRN
  Administered 2021-03-30 – 2021-04-03 (×14): 650 mg via ORAL
  Filled 2021-03-28 (×14): qty 2

## 2021-03-28 MED ORDER — DOXAZOSIN MESYLATE 1 MG PO TABS
1.0000 mg | ORAL_TABLET | Freq: Every day | ORAL | Status: DC
  Administered 2021-03-28 – 2021-04-02 (×6): 1 mg via ORAL
  Filled 2021-03-28 (×8): qty 1

## 2021-03-28 MED ORDER — SENNOSIDES-DOCUSATE SODIUM 8.6-50 MG PO TABS
2.0000 | ORAL_TABLET | Freq: Two times a day (BID) | ORAL | Status: DC
  Administered 2021-03-29 – 2021-04-03 (×9): 2 via ORAL
  Filled 2021-03-28 (×10): qty 2

## 2021-03-28 MED ORDER — SERTRALINE HCL 50 MG PO TABS
50.0000 mg | ORAL_TABLET | Freq: Every day | ORAL | Status: DC
  Administered 2021-03-29 – 2021-04-03 (×5): 50 mg via ORAL
  Filled 2021-03-28 (×6): qty 1

## 2021-03-28 MED ORDER — PANTOPRAZOLE SODIUM 40 MG PO TBEC
40.0000 mg | DELAYED_RELEASE_TABLET | Freq: Two times a day (BID) | ORAL | Status: DC
  Administered 2021-03-28 – 2021-04-03 (×12): 40 mg via ORAL
  Filled 2021-03-28 (×12): qty 1

## 2021-03-28 MED ORDER — HYDROCORTISONE NA SUCCINATE PF 100 MG IJ SOLR
100.0000 mg | Freq: Once | INTRAMUSCULAR | Status: AC
  Administered 2021-03-28: 100 mg via INTRAVENOUS
  Filled 2021-03-28: qty 2

## 2021-03-28 MED ORDER — OXYCODONE HCL 5 MG PO TABS
5.0000 mg | ORAL_TABLET | Freq: Four times a day (QID) | ORAL | Status: DC | PRN
  Administered 2021-03-28 – 2021-03-29 (×3): 5 mg via ORAL
  Filled 2021-03-28 (×3): qty 1

## 2021-03-28 MED ORDER — PRAVASTATIN SODIUM 40 MG PO TABS
40.0000 mg | ORAL_TABLET | Freq: Every day | ORAL | Status: DC
  Administered 2021-03-29 – 2021-04-02 (×6): 40 mg via ORAL
  Filled 2021-03-28 (×6): qty 1

## 2021-03-28 MED ORDER — TACROLIMUS ER 4 MG PO TB24: 8.0000 mg | ORAL_TABLET | Freq: Every day | ORAL | Status: DC

## 2021-03-28 MED ORDER — PROCHLORPERAZINE EDISYLATE 10 MG/2ML IJ SOLN
10.0000 mg | Freq: Four times a day (QID) | INTRAMUSCULAR | Status: DC | PRN
  Administered 2021-03-29: 10 mg via INTRAVENOUS
  Filled 2021-03-28: qty 2

## 2021-03-28 NOTE — ED Provider Notes (Signed)
Emergency Medicine Provider Triage Evaluation Note  Manuel Baker , a 25 y.o. male  was evaluated in triage.  H/O of heart replacement and asthma. Pt complains of nausea and vomiting. Can't remember how many times today, but says "a good amount". He also endorses sharp epigastric pain that comes and goes. This started this morning. He has asthma, states he is short of breath now and struggling to breath. Holding off on an albuterol until EKG given history of QT prolongation in the past.   He is scheduled for a TIBIA repair surgery tomorrow morning for open reduction internal fixation of the tibial platue. He is holding in knee at an odd angle.  Review of Systems  Positive: Nausea, vomiting, epigastric pain Negative: Diarrhea, blood in stool, headache  Physical Exam  There were no vitals taken for this visit. Gen:   Awake, ill appearing. Patient somnolent, but responsive to questions.  Resp:  Increased respiratory effort. He is able to answer questions without stopping for breath but sounds short of breath. Wheezes appreciated in the lower lung fields MSK:   Right knee propped in awkward position. Tender to palpation. DT and PT 2+.  Abdomen: TTP epigastric area. Bowel sounds normal.  Other:  Left eye ptosis, states it is normal for him.   Medical Decision Making  Medically screening exam initiated at 4:42 PM.  Appropriate orders placed.  Manuel Baker was informed that the remainder of the evaluation will be completed by another provider, this initial triage assessment does not replace that evaluation, and the importance of remaining in the ED until their evaluation is complete.  Abdominal pain.    Theron Arista, PA-C 03/28/21 1705    Cathren Laine, MD 03/28/21 2049

## 2021-03-28 NOTE — Progress Notes (Signed)
Chart/flowsheet review for PIV needs. Pt currently has 2 IV sites. Consult cleared.

## 2021-03-28 NOTE — Progress Notes (Signed)
Patient is currently in ED at [facility].  Patient stated that he was going to be admitted due to persistent n/v.      PCP -  None Cardiologist - Dr Cheral Bay @ [facility],  Endocrinology - Dr Winifred Olive  Chest x-ray - 02/19/21 EKG - 02/22/21 Stress Test - n/a ECHO - 12/04/20, 02/25/21 CE Cardiac Cath - n/a  Fasting Blood Sugar - unknown Checks Blood Sugar occasional checks  DM Type 1  Anesthesia review: Yes  Coronavirus Screening Covid test on 03/28/21 is pending results. Do you have any of the following symptoms:  Cough yes/no: No Fever (>100.10F)  yes/no: No Runny nose yes/no: No Sore throat yes/no: No Difficulty breathing/shortness of breath  yes/no: No  Have you traveled in the last 14 days and where? yes/no: No

## 2021-03-28 NOTE — ED Triage Notes (Signed)
Pt from home via EMS for eval of n/v since this morning. Took 8mg  of his own Zofran with relief. Pt asleep during transport and during triage.
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
## 2021-03-28 NOTE — H&P (Signed)
History and Physical  Manuel Baker ZOX:[id number] DOB: 1995/12/26 DOA: 03/28/2021  Referring physician: Dr. Denton Lank, EDP  PCP: Demaris Callander [place] [employer]  Outpatient Specialists: Transplant cardiologist, orthopedic surgery. Patient coming from: Home via EMS.  Chief Complaint: Nausea vomiting right knee pain.  HPI: Manuel Baker is a 25 y.o. male with medical history significant for type 1 diabetes, gastroparesis, heart transplant at [facility], essential hypertension, hyperlipidemia, GERD, chronic anxiety/depression, THC use and cannabis hyperemesis syndrome, seizure disorder, history of CVA who presented to [facility] ED via EMS due to nausea vomiting, polyuria, abdominal cramping of 1 day duration.  Reports that yesterday he was in lots of pain from his right knee injury, fell on it 6 days ago.  Went to see his PCP, was referred to orthopedic surgery Dr. Jena Gauss with plan for a right knee surgical repair on 03/29/2021.  Due to severe pain he took some leftover oxycodone from his heart transplant and also used marijuana last night.  This morning after eating breakfast he had nausea and vomiting.  Was not able to keep anything down.  Associated with polyuria and diffuse abdominal cramping.  Called EMS.  He was brought to the ED for further evaluation and management.  Work-up in the ED revealed DKA type I.  Started on insulin drip and IV fluid hydration.  EDP requested admission for further management.  ED Course:  Afebrile.  BP 128/76, pulse 102, respiration rate 16, O2 saturation 100% on room air.  Lab studies remarkable for venous blood gas pH 7.570, PCO2 25.  Serum sodium 124, potassium 4.3, serum bicarb 19, serum glucose 1100, BUN 25, creatinine 2.24, anion gap 18, GFR 41, AST 53, ALT 55.  WBC 6.7, hemoglobin 9.3, MCV 69, platelet 460.  Review of Systems: Review of systems as noted in the HPI. All other systems reviewed and are negative.   Past Medical History:  Diagnosis Date  . Congenital heart  disease   . Diabetes mellitus without complication (HCC)    type 1  . Hypertension   . Seizures (HCC)    unsure of last seizure tx vimpat   Past Surgical History:  Procedure Laterality Date  . ASD REPAIR  01/13/2020  . BRONCHOSCOPY  01/20/2020  . colonoscopy    . ESOPHAGEAL BRUSHING  02/19/2021   Procedure: ESOPHAGEAL BRUSHING;  Surgeon: Charna Elizabeth, MD;  Location: [facility] ENDOSCOPY;  Service: Endoscopy;;  . ESOPHAGOGASTRODUODENOSCOPY N/A 02/19/2021   Procedure: ESOPHAGOGASTRODUODENOSCOPY (EGD);  Surgeon: Charna Elizabeth, MD;  Location[facility] ENDOSCOPY;  Service: Endoscopy;  Laterality: N/A;  . exploration of chest   02/06/20, 01/25/20, 01/21/20   x 3 checking for post-op hemorhage clot infection.  Dr Dareen Piano  . MEDIASTINOTOMY  01/27/2020   Dr Oleh Genin  . open heart surgery  03/01/2020   Heart Transplant at [facility]   . OTHER SURGICAL HISTORY  01/24/2020   Extracopporeal  Dr Dareen Piano  . TEE WITHOUT CARDIOVERSION N/A 01/06/2018   Procedure: TRANSESOPHAGEAL ECHOCARDIOGRAM (TEE);  Surgeon: Wendall Stade, MD;  Location: [facility] ENDOSCOPY;  Service: Cardiovascular;  Laterality: N/A;  . VENTRICULAR ASSIST DEVICE INSERTION  01/26/2020    Social History:  reports that he has never smoked. He has never used smokeless tobacco. He reports current alcohol use. He reports current drug use. Drug: Marijuana.   Allergies  Allergen Reactions  . Ibuprofen     Told not to take ibuprofen after cardiac surgery  . Nsaids Other (See Comments)    Unknown reaction    Family History  Problem Relation  Age of Onset  . Diabetes Mellitus I Sister       Prior to Admission medications   Medication Sig Start Date End Date Taking? Authorizing Provider  acetaminophen (TYLENOL) 500 MG tablet Take 500 mg by mouth every 6 (six) hours as needed for mild pain.    [provider]  amLODipine (NORVASC) 10 MG tablet Take 10 mg by mouth in the morning. 10/01/20 10/01/21  [provider]  anidulafungin in sodium  chloride 0.9 % 50 mL 50mg  IV Q24 hours Patient not taking: No sig reported 02/24/21   04/26/21, MD  aspirin EC 81 MG tablet Take 81 mg by mouth in the morning. Swallow whole.    [provider]  diclofenac Sodium (VOLTAREN) 1 % GEL Apply 2 g topically 4 (four) times daily as needed for muscle pain. 12/26/20 12/26/21  [provider]  doxazosin (CARDURA) 2 MG tablet Take 1 mg by mouth at bedtime. 10/31/20 10/31/21  [provider]  Glucagon (BAQSIMI ONE PACK) 3 MG/DOSE POWD Place 1 spray into the nose as needed (low blood sugar).    [provider]  insulin degludec (TRESIBA FLEXTOUCH) 100 UNIT/ML FlexTouch Pen Inject 14 Units into the skin at bedtime.  08/04/20   [provider]  insulin glulisine (APIDRA SOLOSTAR) 100 UNIT/ML Solostar Pen Inject 0-45 Units into the skin See admin instructions. Sliding Scale Insulin 08/04/20   [provider]  Insulin Syringe-Needle U-100 (INSULIN SYRINGE .3CC/31GX5/16") 31G X 5/16" 0.3 ML MISC 1 Act by Does not apply route 4 (four) times daily -  before meals and at bedtime. 01/09/18   01/11/18, MD  metoCLOPramide (REGLAN) 5 MG tablet Take 5 mg by mouth 3 (three) times daily with meals.    [provider]  metoCLOPramide (REGLAN) 5 MG/ML injection Inject 1 mL (5 mg total) into the vein 3 (three) times daily before meals. Patient not taking: No sig reported 02/24/21   04/26/21, MD  ondansetron (ZOFRAN-ODT) 4 MG disintegrating tablet Take 4 mg by mouth every 8 (eight) hours as needed for nausea/vomiting. 12/27/20   [provider]  pantoprazole (PROTONIX) 40 MG injection Inject 40 mg into the vein every 12 (twelve) hours. Patient not taking: No sig reported 02/24/21   04/26/21, MD  pantoprazole (PROTONIX) 40 MG tablet Take 40 mg by mouth in the morning and at bedtime.    [provider]  polyethylene glycol powder (GLYCOLAX/MIRALAX) 17 GM/SCOOP powder Take 17 g by mouth daily  as needed for constipation. 10/18/20   [provider]  pravastatin (PRAVACHOL) 40 MG tablet Take 40 mg by mouth at bedtime. 09/02/20   [provider]  predniSONE (DELTASONE) 5 MG tablet Take 2.5 mg by mouth in the morning. 08/04/20   [provider]  sennosides-docusate sodium (SENOKOT-S) 8.6-50 MG tablet Take 2 tablets by mouth daily as needed for constipation.    [provider]  sertraline (ZOLOFT) 50 MG tablet Take 50 mg by mouth in the morning.    [provider]  sodium chloride 0.9 % SOLN 50 mL with promethazine 25 MG/ML SOLN 12.5 mg Inject 12.5 mg into the vein every 6 (six) hours as needed (N/VOmiting). Patient not taking: No sig reported 02/24/21   04/26/21, MD  Tacrolimus ER 4 MG TB24 Take 8 mg by mouth daily before breakfast. 10/31/20 10/31/21  [provider]  traZODone (DESYREL) 50 MG tablet Take 25 mg by mouth at bedtime as needed for sleep.  [provider]  valGANciclovir (VALCYTE) 450 MG tablet Take 900 mg by mouth at bedtime.    [provider]  VIMPAT 50 MG TABS tablet Take 50 mg by mouth 2 (two) times daily. 08/04/20   [provider]    Physical Exam: BP 128/76   Pulse 100   Temp 98.1 F (36.7 C) (Oral)   Resp 16   SpO2 98%   . General: 25 y.o. year-old male well developed well nourished in no acute distress.  Alert and oriented x3. . Cardiovascular: Tachycardic with no rubs or gallops.  No thyromegaly or JVD noted.  Right knee edema and R lower extremity edema. 2/4 pulses in all 4 extremities. Marland Kitchen. Respiratory: Clear to auscultation with no wheezes or rales. Good inspiratory effort. . Abdomen: Soft nontender nondistended with normal bowel sounds x4 quadrants. . Muskuloskeletal: No cyanosis or clubbing. R lower extremity edema. . Neuro: CN II-XII intact, strength, sensation, reflexes . Skin: No ulcerative lesions noted or rashes . Psychiatry: Judgement and insight appear normal. Mood is  appropriate for condition and setting          Labs on Admission:  Basic Metabolic Panel: Recent Labs  Lab 03/28/21 1636 03/28/21 1920  NA 124* 127*  K 4.3 4.2  CL 87*  --   CO2 19*  --   GLUCOSE 1,185*  --   BUN 25*  --   CREATININE 2.24*  --   CALCIUM 9.6  --    Liver Function Tests: Recent Labs  Lab 03/28/21 1636  AST 53*  ALT 55*  ALKPHOS 141*  BILITOT 1.1  PROT 7.8  ALBUMIN 3.8   Recent Labs  Lab 03/28/21 1636  LIPASE 31   No results for input(s): AMMONIA in the last 168 hours. CBC: Recent Labs  Lab 03/28/21 1636 03/28/21 1920  WBC 6.7  --   HGB 9.3* 10.2*  HCT 27.9* 30.0*  MCV 69.4*  --   PLT 460*  --    Cardiac Enzymes: No results for input(s): CKTOTAL, CKMB, CKMBINDEX, TROPONINI in the last 168 hours.  BNP (last 3 results) Recent Labs    10/13/20 1100 12/02/20 2219  BNP 93.7 137.0*    ProBNP (last 3 results) No results for input(s): PROBNP in the last 8760 hours.  CBG: Recent Labs  Lab 03/28/21 1940 03/28/21 2031  GLUCAP >600* >600*    Radiological Exams on Admission: CT KNEE RIGHT WO CONTRAST  Result Date: 03/28/2021 CLINICAL DATA:  Right knee pain when walking.  Fall 2 weeks ago. EXAM: CT OF THE right KNEE WITHOUT CONTRAST TECHNIQUE: Multidetector CT imaging of the right knee was performed according to the standard protocol. Multiplanar CT image reconstructions were also generated. COMPARISON:  None. FINDINGS: Bones/Joint/Cartilage Relatively nondisplaced four part fracture of the tibial plateau with intersecting fracture planes in the vicinity of the tibial spine and articular surface of the lateral tibial plateau and extending to the medial and lateral metaphysis. Mildly comminuted primarily oblique fracture of the proximal fibular head. Small lipohemarthrosis. Ligaments Suboptimally assessed by CT. Muscles and Tendons Unremarkable Soft tissues Low-level edema tracks in the popliteal space and in the regional subcutaneous tissues.  IMPRESSION: 1. Relatively nondisplaced 4 part fracture of the tibial plateau (type V in the Hohl and [employer] classification). Mildly comminuted proximal fibular head fracture. 2. Infiltrative edema in the popliteal space and subcutaneous tissues. Electronically Signed   By: Gaylyn RongWalter  Liebkemann M.D.   On: 03/28/2021 11:57    EKG: I independently viewed the  EKG done and my findings are as followed: Not available at the time of this visit.  Ordered and pending.  Assessment/Plan Present on Admission: . DKA, type 1 (HCC)  Active Problems:   DKA, type 1 (HCC)  DKA, type I. Presented with sudden onset nausea vomiting after waking up this morning and eating breakfast. States was not feeling well yesterday due to severe right knee pain after an injury of his right knee 6 days ago where he landed on it.  Was seen by his PCP had an x-ray done which revealed a fracture involving his right knee.  Was referred to orthopedic surgery Dr. Jena Gauss with plan for repair on 03/29/2021. Stated he took leftover oxycodone and marijuana last night.  This morning woke up with intractable nausea and vomiting after eating. Reports compliance with his insulin. Started on DKA type 1 protocol, continue Repeat BMP every 4 hours Transition to subcu insulin when indicated per DKA protocol  Right knee injury, self-reported fracture seen on x-ray Reviewed medical records showing the patient was seen by Dr. Jena Gauss with a diagnosis of closed fracture of lateral portion of right tibial plateau. Plan for orthopedic surgery on 03/29/2021 Orthopedic surgery consulted via secure chat. Cardiology consulted for clearance. Pain control with IV Dilaudid as needed for severe pain, oxycodone as needed for moderate pain, Tylenol as needed for mild pain. Bowel regimen to avoid opioid-induced constipation.  Right lower extremity edema and severe tenderness with minimal palpation. Obtain ultrasound of right lower extremity to rule out DVT. Pain  control as stated above.  Pseudohyponatremia Presented with serum sodium 124, serum glucose over 1100 Sodium correction for hyperglycemia 140. Continue insulin drip and IV fluid hydration BMP every 4 hours  AKI likely prerenal in the setting of DKA type I, hyperglycemia, polyuria. Baseline creatinine appears to be 1.2 with GFR greater than 60 Presented with creatinine of 2.24 with GFR 41. Continue IV fluid hydration Closely monitor urine output Avoid nephrotoxic agent Repeat renal panel in the morning.  Anemia of chronic disease in the setting of heart transplant Presented with hemoglobin of 9.3 No overt bleeding MCV 69 Obtain iron studies Monitor H&H  History of heart transplant Resume Home regimen Cardiology consulted, Fellow Dr. Meredeth Ide via secure chat  Chronic diastolic CHF Last 2D echo done on 12/04/2020 showed normal LVEF 60 to 65% Strict I's and O's and daily weight Resume home cardiac medications  Seizure disorder Resume home Vimpat If unable to tolerate oral intake, switch to IV Vimpat. Seizures precautions.  Essential hypertension BP is currently at goal Resume home regimen Closely monitor vital signs  Chronic depression/anxiety Resume home regimen.  GERD Resume home PPI twice daily.  THC use disorder/history of cannabis hyperemesis syndrome Counseled on THC cessation at bedside.     DVT prophylaxis: Subcu Lovenox daily.  Code Status: Full code.  Family Communication: Patient states his parents have already been contacted about being admitted.  Disposition Plan: Admit to progressive unit.  Consults called: Cardiology, orthopedic surgery.  Admission status: Inpatient status.  Patient will require at least 2 midnights for further evaluation and treatment of present condition.   Status is: Inpatient    Dispo:  Patient From: Home  Planned Disposition: Home, possibly on 03/30/2021 after cardiology, orthopedic surgery sign off.  Medically  stable for discharge: No, ongoing management of DKA type I, right knee injury.         Darlin Drop MD [facility] Pager [phone]  If 7PM-7AM, please contact night-coverage [email or web address] Password  [email or web address]  03/28/2021, 8:55 PM

## 2021-03-28 NOTE — Anesthesia Preprocedure Evaluation (Addendum)
Anesthesia Evaluation  Patient identified by MRN, date of birth, ID band Patient awake    Reviewed: Allergy & Precautions, NPO status , Patient's Chart, lab work & pertinent test results  History of Anesthesia Complications Negative for: history of anesthetic complications  Airway Mallampati: I  TM Distance: >3 FB Neck ROM: Full    Dental no notable dental hx.    Pulmonary neg pulmonary ROS,    Pulmonary exam normal        Cardiovascular hypertension, Pt. 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  Reproductive/Obstetrics negative OB ROS                           Anesthesia Physical Anesthesia Plan  ASA: IV  Anesthesia Plan: General   Post-op Pain Management:    Induction: Intravenous  PONV Risk Score and Plan: 3 and Treatment may vary due to age or medical condition, Midazolam, Ondansetron and Dexamethasone  Airway Management Planned: Oral ETT  Additional Equipment:   Intra-op Plan:   Post-operative Plan: Extubation in OR  Informed Consent: I have reviewed the patients History and Physical, chart, labs and discussed the procedure including the risks, benefits and alternatives for the proposed anesthesia with the patient or authorized  representative who has indicated his/her understanding and acceptance.     Dental advisory given  Plan Discussed with: CRNA  Anesthesia Plan Comments: (See PAT note written 03/28/2021 by Shonna Chock, PA-C. History heart transplant 2021. DM1 with gastroparesis and admission for DKA 01/2021. Candidiasis and CMV esophagitis 2022. Was for surgery on 03/29/21 but admitted with N/V and DKA. Surgery moved to 03/30/21. See for preoperative cardiology evaluation by Feliberto Harts, MD, "S/p OHT- He presents with right knee pain 2/2 known tibial fracture and DKA. He is >1 year out from [facility] at [facility] with complete one year evaluation including RHC, LHC and biopsy which were all unremarkable. He reports good compliance with his immunosuppression regimen and has no signs or symptoms of rejection or heart failure at this time. No further work-up indicated prior to right knee surgery. It is imperative that his immunosuppressants be continued perioperatively."  )     Anesthesia Quick Evaluation

## 2021-03-28 NOTE — ED Provider Notes (Signed)
[facility] EMERGENCY DEPARTMENT Provider Note   CSN: [id number] Arrival date & time: 03/28/21  1637     History Chief Complaint  Patient presents with  . Emesis    Corwyn Mullaly is a 25 y.o. male.  Patient with hx iddm, heart transplant, indicates this past Thursday onset nausea, elevated blood sugars, in past day has felt worst with vomiting, general weakness. States despite vomiting, has been able to keep down his normal meds. Several episodes of emesis, not bloody or bilious. Denies fever or chills. No abd pain or distension. Having normal bms, no diarrhea. No dysuria+polyuria. No headache. No chest pain or discomfort. No sob. No cough or uri symptoms.  Was scheduled tomorrow to have tibial plateau fx repair.   The history is provided by the patient.  Emesis Associated symptoms: no abdominal pain, no chills, no cough, no fever, no headaches and no sore throat        Past Medical History:  Diagnosis Date  . Congenital heart disease   . Diabetes mellitus without complication (HCC)    type 1  . Hypertension   . Seizures (HCC)    unsure of last seizure tx vimpat    Patient Active Problem List   Diagnosis Date Noted  . Closed fracture of lateral portion of right tibial plateau 03/27/2021  . Diabetic ketoacidosis (HCC) 02/19/2021  . DKA (diabetic ketoacidosis) (HCC) 02/19/2021  . SIRS (systemic inflammatory response syndrome) (HCC) 02/19/2021  . AKI (acute kidney injury) (HCC) 02/19/2021  . Prolonged QT interval 02/19/2021  . Intractable nausea and vomiting 12/07/2020  . Protein-calorie malnutrition, severe 12/05/2020  . Hyperglycemia 12/03/2020  . History of stroke 12/03/2020  . Esophagitis 12/03/2020  . Elevated lactic acid level 12/03/2020  . Diabetic gastroparesis (HCC) 12/03/2020  . Dehydration 12/03/2020  . Non-intractable vomiting   . Hematemesis with nausea   . Septic embolism (HCC)   . Libman Sacks endocarditis (HCC)   . Leucocytosis   .  Marantic endocarditis   . 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 Uncontrolled type 1 diabetes mellitus with hyperglycemia (HCC) 01/03/2018  . Seizure disorder (HCC) 01/03/2018  . Stroke (cerebrum) (HCC) 01/03/2018  . Stroke (HCC) 01/03/2018  . Ebstein's anomaly     Past Surgical History:  Procedure Laterality Date  . ASD REPAIR  01/13/2020  . BRONCHOSCOPY  01/20/2020  . colonoscopy    . ESOPHAGEAL BRUSHING  02/19/2021   Procedure: ESOPHAGEAL BRUSHING;  Surgeon: Charna Elizabeth, MD;  Location: [facility] ENDOSCOPY;  Service: Endoscopy;;  . ESOPHAGOGASTRODUODENOSCOPY N/A 02/19/2021   Procedure: ESOPHAGOGASTRODUODENOSCOPY (EGD);  Surgeon: Charna Elizabeth, MD;  Location: [facility] ENDOSCOPY;  Service: Endoscopy;  Laterality: N/A;  . exploration of chest   02/06/20, 01/25/20, 01/21/20   x 3 checking for post-op hemorhage clot infection.  Dr Dareen Piano  . MEDIASTINOTOMY  01/27/2020   Dr Oleh Genin  . open heart surgery  03/01/2020   Heart Transplant at [facility]   . OTHER SURGICAL HISTORY  01/24/2020   Extracopporeal  Dr Dareen Piano  . TEE WITHOUT CARDIOVERSION N/A 01/06/2018   Procedure: TRANSESOPHAGEAL ECHOCARDIOGRAM (TEE);  Surgeon: Wendall Stade, MD;  Location: [facility] ENDOSCOPY;  Service: Cardiovascular;  Laterality: N/A;  . VENTRICULAR ASSIST DEVICE INSERTION  01/26/2020       Family History  Problem Relation Age of Onset  . Diabetes Mellitus I Sister     Social History   Tobacco Use  . Smoking status: Never Smoker  .  Smokeless tobacco: Never Used  Vaping Use  . Vaping Use: Never used  Substance Use Topics  . Alcohol use: Yes    Comment: social  . Drug use: Yes    Types: Marijuana    Home Medications Prior to Admission medications   Medication Sig Start Date End Date Taking? Authorizing Provider  acetaminophen (TYLENOL) 500 MG tablet Take 500 mg by mouth every 6 (six) hours as needed for mild pain.    [provider]   amLODipine (NORVASC) 10 MG tablet Take 10 mg by mouth in the morning. 10/01/20 10/01/21  [provider]  anidulafungin in sodium chloride 0.9 % 50 mL 50mg  IV Q24 hours Patient not taking: No sig reported 02/24/21   04/26/21, MD  aspirin EC 81 MG tablet Take 81 mg by mouth in the morning. Swallow whole.    [provider]  diclofenac Sodium (VOLTAREN) 1 % GEL Apply 2 g topically 4 (four) times daily as needed for muscle pain. 12/26/20 12/26/21  [provider]  doxazosin (CARDURA) 2 MG tablet Take 1 mg by mouth at bedtime. 10/31/20 10/31/21  [provider]  Glucagon (BAQSIMI ONE PACK) 3 MG/DOSE POWD Place 1 spray into the nose as needed (low blood sugar).    [provider]  insulin degludec (TRESIBA FLEXTOUCH) 100 UNIT/ML FlexTouch Pen Inject 14 Units into the skin at bedtime.  08/04/20   [provider]  insulin glulisine (APIDRA SOLOSTAR) 100 UNIT/ML Solostar Pen Inject 0-45 Units into the skin See admin instructions. Sliding Scale Insulin 08/04/20   [provider]  Insulin Syringe-Needle U-100 (INSULIN SYRINGE .3CC/31GX5/16") 31G X 5/16" 0.3 ML MISC 1 Act by Does not apply route 4 (four) times daily -  before meals and at bedtime. 01/09/18   01/11/18, MD  metoCLOPramide (REGLAN) 5 MG tablet Take 5 mg by mouth 3 (three) times daily with meals.    [provider]  metoCLOPramide (REGLAN) 5 MG/ML injection Inject 1 mL (5 mg total) into the vein 3 (three) times daily before meals. Patient not taking: No sig reported 02/24/21   04/26/21, MD  ondansetron (ZOFRAN-ODT) 4 MG disintegrating tablet Take 4 mg by mouth every 8 (eight) hours as needed for nausea/vomiting. 12/27/20   [provider]  pantoprazole (PROTONIX) 40 MG injection Inject 40 mg into the vein every 12 (twelve) hours. Patient not taking: No sig reported 02/24/21   04/26/21, MD  pantoprazole (PROTONIX) 40 MG tablet Take 40 mg by mouth in the  morning and at bedtime.    [provider]  polyethylene glycol powder (GLYCOLAX/MIRALAX) 17 GM/SCOOP powder Take 17 g by mouth daily as needed for constipation. 10/18/20   [provider]  pravastatin (PRAVACHOL) 40 MG tablet Take 40 mg by mouth at bedtime. 09/02/20   [provider]  predniSONE (DELTASONE) 5 MG tablet Take 2.5 mg by mouth in the morning. 08/04/20   [provider]  sennosides-docusate sodium (SENOKOT-S) 8.6-50 MG tablet Take 2 tablets by mouth daily as needed for constipation.    [provider]  sertraline (ZOLOFT) 50 MG tablet Take 50 mg by mouth in the morning.    [provider]  sodium chloride 0.9 % SOLN 50 mL with promethazine 25 MG/ML SOLN 12.5 mg Inject 12.5 mg into the vein every 6 (six) hours as needed (N/VOmiting). Patient not taking: No sig reported 02/24/21   04/26/21, MD  Tacrolimus ER 4 MG TB24 Take 8 mg by  mouth daily before breakfast. 10/31/20 10/31/21  [provider]  traZODone (DESYREL) 50 MG tablet Take 25 mg by mouth at bedtime as needed for sleep.    [provider]  valGANciclovir (VALCYTE) 450 MG tablet Take 900 mg by mouth at bedtime.    [provider]  VIMPAT 50 MG TABS tablet Take 50 mg by mouth 2 (two) times daily. 08/04/20   [provider]    Allergies    Ibuprofen and Nsaids  Review of Systems   Review of Systems  Constitutional: Negative for chills and fever.  HENT: Negative for sore throat.   Eyes: Negative for redness.  Respiratory: Negative for cough and shortness of breath.   Cardiovascular: Negative for chest pain.  Gastrointestinal: Positive for nausea and vomiting. Negative for abdominal pain.  Endocrine: Positive for polyuria.  Genitourinary: Negative for dysuria and flank pain.  Musculoskeletal: Negative for back pain and neck pain.  Skin: Negative for rash.  Neurological: Negative for headaches.  Hematological: Does not bruise/bleed  easily.  Psychiatric/Behavioral: Negative for confusion.    Physical Exam Updated Vital Signs BP (!) 164/103   Pulse (!) 105   Temp 98.1 F (36.7 C) (Oral)   Resp (!) 32   SpO2 100%   Physical Exam Vitals and nursing note reviewed.  Constitutional:      Appearance: Normal appearance. He is well-developed.  HENT:     Head: Atraumatic.     Nose: Nose normal.     Mouth/Throat:     Mouth: Mucous membranes are moist.     Pharynx: Oropharynx is clear.  Eyes:     General: No scleral icterus.    Conjunctiva/sclera: Conjunctivae normal.     Pupils: Pupils are equal, round, and reactive to light.  Neck:     Trachea: No tracheal deviation.  Cardiovascular:     Rate and Rhythm: Regular rhythm. Tachycardia present.     Pulses: Normal pulses.     Heart sounds: Normal heart sounds. No murmur heard. No friction rub. No gallop.   Pulmonary:     Effort: Pulmonary effort is normal. No accessory muscle usage or respiratory distress.     Breath sounds: Normal breath sounds.  Abdominal:     General: Bowel sounds are normal. There is no distension.     Palpations: Abdomen is soft. There is no mass.     Tenderness: There is no abdominal tenderness. There is no guarding or rebound.     Hernia: No hernia is present.  Genitourinary:    Comments: No cva tenderness. Musculoskeletal:     Cervical back: Normal range of motion and neck supple. No rigidity.     Comments: No foot/ankle edema.  Tender right proximal tibia. Compartments of leg soft, not tense, distal pulses palp.   Skin:    General: Skin is warm and dry.     Findings: No rash.  Neurological:     Mental Status: He is alert.     Comments: Alert, speech clear.   Psychiatric:        Mood and Affect: Mood normal.     ED Results / Procedures / Treatments   Labs (all labs ordered are listed, but only abnormal results are displayed) Results for orders placed or performed during the hospital encounter of 03/28/21  Lipase, blood   Result Value Ref Range   Lipase 31 11 - 51 U/L  Comprehensive metabolic panel  Result Value Ref Range   Sodium 124 (L) 135 - 145  mmol/L   Potassium 4.3 3.5 - 5.1 mmol/L   Chloride 87 (L) 98 - 111 mmol/L   CO2 19 (L) 22 - 32 mmol/L   Glucose, Bld 1,185 (HH) 70 - 99 mg/dL   BUN 25 (H) 6 - 20 mg/dL   Creatinine, Ser 1.612.24 (H) 0.61 - 1.24 mg/dL   Calcium 9.6 8.9 - 09.610.3 mg/dL   Total Protein 7.8 6.5 - 8.1 g/dL   Albumin 3.8 3.5 - 5.0 g/dL   AST 53 (H) 15 - 41 U/L   ALT 55 (H) 0 - 44 U/L   Alkaline Phosphatase 141 (H) 38 - 126 U/L   Total Bilirubin 1.1 0.3 - 1.2 mg/dL   GFR, Estimated 41 (L) >60 mL/min   Anion gap 18 (H) 5 - 15  CBC  Result Value Ref Range   WBC 6.7 4.0 - 10.5 K/uL   RBC 4.02 (L) 4.22 - 5.81 MIL/uL   Hemoglobin 9.3 (L) 13.0 - 17.0 g/dL   HCT 04.527.9 (L) 40.939.0 - 81.152.0 %   MCV 69.4 (L) 80.0 - 100.0 fL   MCH 23.1 (L) 26.0 - 34.0 pg   MCHC 33.3 30.0 - 36.0 g/dL   RDW 91.416.6 (H) 78.211.5 - 95.615.5 %   Platelets 460 (H) 150 - 400 K/uL   nRBC 0.0 0.0 - 0.2 %   CT KNEE RIGHT WO CONTRAST  Result Date: 03/28/2021 CLINICAL DATA:  Right knee pain when walking.  Fall 2 weeks ago. EXAM: CT OF THE right KNEE WITHOUT CONTRAST TECHNIQUE: Multidetector CT imaging of the right knee was performed according to the standard protocol. Multiplanar CT image reconstructions were also generated. COMPARISON:  None. FINDINGS: Bones/Joint/Cartilage Relatively nondisplaced four part fracture of the tibial plateau with intersecting fracture planes in the vicinity of the tibial spine and articular surface of the lateral tibial plateau and extending to the medial and lateral metaphysis. Mildly comminuted primarily oblique fracture of the proximal fibular head. Small lipohemarthrosis. Ligaments Suboptimally assessed by CT. Muscles and Tendons Unremarkable Soft tissues Low-level edema tracks in the popliteal space and in the regional subcutaneous tissues. IMPRESSION: 1. Relatively nondisplaced 4 part fracture of the  tibial plateau (type V in the Hohl and YahooMoore classification). Mildly comminuted proximal fibular head fracture. 2. Infiltrative edema in the popliteal space and subcutaneous tissues. Electronically Signed   By: Gaylyn RongWalter  Liebkemann M.D.   On: 03/28/2021 11:57    EKG EKG Interpretation  Date/Time:  Wednesday Mar 28 2021 21:25:32 EDT Ventricular Rate:  100 PR Interval:  139 QRS Duration: 103 QT Interval:  368 QTC Calculation: 475 R Axis:   108 Text Interpretation: Sinus tachycardia Biatrial enlargement ST-t wave abnormality Artifact Abnormal ECG Confirmed by Gerhard MunchLockwood, Robert [phone]([id number]) on 03/29/2021 11:11:12 PM   Radiology CT KNEE RIGHT WO CONTRAST  Result Date: 03/28/2021 CLINICAL DATA:  Right knee pain when walking.  Fall 2 weeks ago. EXAM: CT OF THE right KNEE WITHOUT CONTRAST TECHNIQUE: Multidetector CT imaging of the right knee was performed according to the standard protocol. Multiplanar CT image reconstructions were also generated. COMPARISON:  None. FINDINGS: Bones/Joint/Cartilage Relatively nondisplaced four part fracture of the tibial plateau with intersecting fracture planes in the vicinity of the tibial spine and articular surface of the lateral tibial plateau and extending to the medial and lateral metaphysis. Mildly comminuted primarily oblique fracture of the proximal fibular head. Small lipohemarthrosis. Ligaments Suboptimally assessed by CT. Muscles and Tendons Unremarkable Soft tissues Low-level edema tracks in the popliteal space and in the regional subcutaneous  tissues. IMPRESSION: 1. Relatively nondisplaced 4 part fracture of the tibial plateau (type V in the Hohl and Yahoo). Mildly comminuted proximal fibular head fracture. 2. Infiltrative edema in the popliteal space and subcutaneous tissues. Electronically Signed   By: Gaylyn Rong M.D.   On: 03/28/2021 11:57    Procedures Procedures   Medications Ordered in ED Medications  lactated ringers bolus 948 mL  (has no administration in time range)  insulin regular, human (MYXREDLIN) 100 units/ 100 mL infusion (has no administration in time range)  lactated ringers infusion (has no administration in time range)  dextrose 5 % in lactated ringers infusion (has no administration in time range)  dextrose 50 % solution 0-50 mL (has no administration in time range)  potassium chloride 10 mEq in 100 mL IVPB (has no administration in time range)    ED Course  I have reviewed the triage vital signs and the nursing notes.  Pertinent labs & imaging results that were available during my care of the patient were reviewed by me and considered in my medical decision making (see chart for details).    MDM Rules/Calculators/A&P                         Iv ns bolus. Stat labs.   Reviewed nursing notes and prior charts for additional history.   Labs reviewed/interpreted by me - glucose extremely high > 1100. hco3 mildly low, w mildly elevated gap, concerning for dka.    On chronic steroid therapy, will give dose solucortef.   Additional fluids and insulin gtt per hyperglycemic crisis order set.   Insulin gtt titrated per orderset/endotool, additional fluids.   Recheck abd soft non tender. No new c/o, no fever/chills, no new pain. Pt breathing comfortably, reports starting to feel improved.   Medicine team consulted for admission.  Pt with recent tibia fx and plan for surgery - once dka/metabolically stable, admitting team will need to consult pts orthopedist for definitive repair.   CRITICAL CARE RE: DKA, severe hyperglycemia Performed by: Suzi Roots Total critical care time: 75 minutes Critical care time was exclusive of separately billable procedures and treating other patients. Critical care was necessary to treat or prevent imminent or life-threatening deterioration. Critical care was time spent personally by me on the following activities: development of treatment plan with patient and/or  surrogate as well as nursing, discussions with consultants, evaluation of patient's response to treatment, examination of patient, obtaining history from patient or surrogate, ordering and performing treatments and interventions, ordering and review of laboratory studies, ordering and review of radiographic studies, pulse oximetry and re-evaluation of patient's condition.    Final Clinical Impression(s) / ED Diagnoses Final diagnoses:  None    Rx / DC Orders ED Discharge Orders    None       Cathren Laine, MD 04/03/21 [phone]

## 2021-03-28 NOTE — Consult Note (Signed)
Cardiology Consultation:   Patient ID: Manuel Baker MRN: [id number]; DOB: 04/01/1996  Admit date: 03/28/2021 Date of Consult: 03/28/2021  [facility]:  [facility] [place] [facility]   [facility] Providers Cardiologist:  Kristeen MissPhilip Nahser, MD   {  Patient Profile:   Manuel Baker is a 25 y.o. 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  History of Present Illness:   Manuel Baker states that he tripped when going up the stairs and broke his right fibula about 6 days ago. He was seen by Dr. Jena GaussHaddix with orthopedic surgery and planned for surgical repair on 5/5. He presented today to the ER with nausea, vomiting and abdominal cramping found to be in DKA. Cardiology is consulted for evaluation prior to his surgery. Prior to his fall, he had been active without limitation. He denies any chest pain, shortness of breath, lightheadedness, dizziness or lower leg swelling. He reports good compliance with his immunosuppression regimen including prednisone 5mg  and envarsus 5mg  daily.    Past Medical History:  Diagnosis Date  . Congenital heart disease   . Diabetes mellitus without complication (HCC)    type 1  . Hypertension   . Seizures (HCC)    unsure of last seizure tx vimpat    Past Surgical History:  Procedure Laterality Date  . ASD REPAIR  01/13/2020  . BRONCHOSCOPY  01/20/2020  . colonoscopy    . ESOPHAGEAL BRUSHING  02/19/2021   Procedure: ESOPHAGEAL BRUSHING;  Surgeon: Charna ElizabethMann, Jyothi, MD;  [facility];  Service: Endoscopy;;  . ESOPHAGOGASTRODUODENOSCOPY N/A 02/19/2021   Procedure: ESOPHAGOGASTRODUODENOSCOPY (EGD);  Surgeon: Charna ElizabethMann, Jyothi, MD;  [facility];  Service: Endoscopy;  Laterality: N/A;  . exploration of chest   02/06/20, 01/25/20, 01/21/20   x 3 checking for post-op hemorhage clot infection.  Dr Dareen PianoAnderson  .  MEDIASTINOTOMY  01/27/2020   Dr Oleh GeninSchroder  . open heart surgery  03/01/2020   [facility]   . OTHER SURGICAL HISTORY  01/24/2020   Extracopporeal  Dr Dareen PianoANderson  . TEE WITHOUT CARDIOVERSION N/A 01/06/2018   Procedure: TRANSESOPHAGEAL ECHOCARDIOGRAM (TEE);  Surgeon: Wendall StadeNishan, Peter C, MD;  [facility];  Service: Cardiovascular;  Laterality: N/A;  . VENTRICULAR ASSIST DEVICE INSERTION  01/26/2020     Home Medications:  Prior to Admission medications   Medication Sig Start Date End Date Taking? Authorizing Provider  acetaminophen (TYLENOL) 500 MG tablet Take 500 mg by mouth every 6 (six) hours as needed for mild pain.    [provider]  amLODipine (NORVASC) 10 MG tablet Take 10 mg by mouth in the morning. 10/01/20 10/01/21  [provider]  anidulafungin in sodium chloride 0.9 % 50 mL 50mg  IV Q24 hours Patient not taking: No sig reported 02/24/21   Zannie CoveJoseph, Preetha, MD  aspirin EC 81 MG tablet Take 81 mg by mouth in the morning. Swallow whole.    [provider]  diclofenac Sodium (VOLTAREN) 1 % GEL Apply 2 g topically 4 (four) times daily as needed for muscle pain. 12/26/20 12/26/21  [provider]  doxazosin (CARDURA) 2 MG tablet Take 1 mg by mouth at bedtime. 10/31/20 10/31/21  [provider]  Glucagon (BAQSIMI ONE PACK) 3 MG/DOSE POWD Place 1 spray into the nose as needed (low blood sugar).    [provider]  insulin degludec (TRESIBA  FLEXTOUCH) 100 UNIT/ML FlexTouch Pen Inject 14 Units into the skin at bedtime.  08/04/20   [provider]  insulin glulisine (APIDRA SOLOSTAR) 100 UNIT/ML Solostar Pen Inject 0-45 Units into the skin See admin instructions. Sliding Scale Insulin 08/04/20   [provider]  Insulin Syringe-Needle U-100 (INSULIN SYRINGE .3CC/31GX5/16") 31G X 5/16" 0.3 ML MISC 1 Act by Does not apply route 4 (four) times daily -  before meals and at bedtime. 01/09/18   Rolly Salter, MD   metoCLOPramide (REGLAN) 5 MG tablet Take 5 mg by mouth 3 (three) times daily with meals.    [provider]  metoCLOPramide (REGLAN) 5 MG/ML injection Inject 1 mL (5 mg total) into the vein 3 (three) times daily before meals. Patient not taking: No sig reported 02/24/21   Zannie Cove, MD  ondansetron (ZOFRAN-ODT) 4 MG disintegrating tablet Take 4 mg by mouth every 8 (eight) hours as needed for nausea/vomiting. 12/27/20   [provider]  pantoprazole (PROTONIX) 40 MG injection Inject 40 mg into the vein every 12 (twelve) hours. Patient not taking: No sig reported 02/24/21   Zannie Cove, MD  pantoprazole (PROTONIX) 40 MG tablet Take 40 mg by mouth in the morning and at bedtime.    [provider]  polyethylene glycol powder (GLYCOLAX/MIRALAX) 17 GM/SCOOP powder Take 17 g by mouth daily as needed for constipation. 10/18/20   [provider]  pravastatin (PRAVACHOL) 40 MG tablet Take 40 mg by mouth at bedtime. 09/02/20   [provider]  predniSONE (DELTASONE) 5 MG tablet Take 2.5 mg by mouth in the morning. 08/04/20   [provider]  sennosides-docusate sodium (SENOKOT-S) 8.6-50 MG tablet Take 2 tablets by mouth daily as needed for constipation.    [provider]  sertraline (ZOLOFT) 50 MG tablet Take 50 mg by mouth in the morning.    [provider]  sodium chloride 0.9 % SOLN 50 mL with promethazine 25 MG/ML SOLN 12.5 mg Inject 12.5 mg into the vein every 6 (six) hours as needed (N/VOmiting). Patient not taking: No sig reported 02/24/21   Zannie Cove, MD  Tacrolimus ER 4 MG TB24 Take 8 mg by mouth daily before breakfast. 10/31/20 10/31/21  [provider]  traZODone (DESYREL) 50 MG tablet Take 25 mg by mouth at bedtime as needed for sleep.    [provider]  valGANciclovir (VALCYTE) 450 MG tablet Take 900 mg by mouth at bedtime.    [provider]  VIMPAT 50 MG TABS tablet Take 50 mg by mouth 2  (two) times daily. 08/04/20   [provider]    Inpatient Medications: Scheduled Meds: . [START ON 03/29/2021] amLODipine  10 mg Oral q AM  . doxazosin  1 mg Oral QHS  . lacosamide  50 mg Oral BID  . pantoprazole  40 mg Oral BID  . pravastatin  40 mg Oral QHS  . [START ON 03/29/2021] predniSONE  2.5 mg Oral q AM  . senna-docusate  2 tablet Oral BID  . [START ON 03/29/2021] sertraline  50 mg Oral q AM  . [START ON 03/29/2021] Tacrolimus ER  8 mg Oral QAC breakfast  . valGANciclovir  900 mg Oral QHS   Continuous Infusions: . dextrose 5% lactated ringers    . insulin 4.8 Units/hr (03/28/21 1946)  . lactated ringers 125 mL/hr at 03/28/21 2020   PRN Meds: acetaminophen, dextrose, HYDROmorphone (DILAUDID) injection, melatonin, oxyCODONE, prochlorperazine  Allergies:    Allergies  Allergen Reactions  .  Ibuprofen     Told not to take ibuprofen after cardiac surgery  . Nsaids Other (See Comments)    Unknown reaction    Social History:   Social History   Socioeconomic History  . Marital status: Single    Spouse name: Not on file  . Number of children: Not on file  . Years of education: Not on file  . Highest education level: Not on file  Occupational History  . Not on file  Tobacco Use  . Smoking status: Never Smoker  . Smokeless tobacco: Never Used  Vaping Use  . Vaping Use: Never used  Substance and Sexual Activity  . Alcohol use: Yes    Comment: social  . Drug use: Yes    Types: Marijuana  . Sexual activity: Not on file  Other Topics Concern  . Not on file  Social History Narrative  . Not on file   Social Determinants of Health   Financial Resource Strain: Not on file  Food Insecurity: Not on file  Transportation Needs: Not on file  Physical Activity: Not on file  Stress: Not on file  Social Connections: Not on file  Intimate Partner Violence: Not on file    Family History:    Family History  Problem Relation Age of Onset  . Diabetes Mellitus I  Sister      ROS:  Please see the history of present illness.  All other ROS reviewed and negative.     Physical Exam/Data:   Vitals:   03/28/21 2045 03/28/21 2100 03/28/21 2115 03/28/21 2130  BP: 128/76 128/70 122/69 (!) 141/89  Pulse: 100 100 96 (!) 105  Resp:      Temp:      TempSrc:      SpO2: 98% 100% 100% 100%    Intake/Output Summary (Last 24 hours) at 03/28/2021 2159 Last data filed at 03/28/2021 2034 Gross per 24 hour  Intake 1048 ml  Output --  Net 1048 ml   Last 3 Weights 03/28/2021 02/24/2021 02/23/2021  Weight (lbs) 104 lb 8 oz 107 lb 12.9 oz 106 lb 7.7 oz  Weight (kg) 47.4 kg 48.9 kg 48.3 kg     There is no height or weight on file to calculate BMI.  General:  Well nourished, well developed, in no acute distress HEENT: normal Neck: no JVD Cardiac:  Tachycardic, regular rhythm, no murmurs  Lungs:  clear to auscultation bilaterally, no wheezing, rhonchi or rales  Abd: soft, nontender, no hepatomegaly  Ext: no edema. Right knee warm to the touch Musculoskeletal:  No deformities, BUE and BLE strength normal and equal Skin: warm and dry  Neuro:  CNs 2-12 intact, no focal abnormalities noted Psych:  Normal affect   EKG:  The EKG was personally reviewed and demonstrates:  Sinus tachycardia Telemetry:  Telemetry was personally reviewed and demonstrates:  Sinus tachycardia  Relevant CV Studies: Echo 02/25/21 NORMAL LEFT VENTRICULARSYSTOLIC FUNCTION WITH MILD LVH  NORMAL RIGHT VENTRICULAR SYSTOLIC FUNCTION  VALVULAR REGURGITATION: TRIVIAL MR, TRIVIAL PR, MILD TR  NO VALVULAR STENOSIS  RHC and LHC 02/07/21 normal filling pressures, CO and PVR -- normal coronary angiography. Biopsy 0R/pAMR0   Laboratory Data:  High Sensitivity Troponin:  No results for input(s): TROPONINIHS in the last 720 hours.   Chemistry Recent Labs  Lab 03/28/21 1636 03/28/21 1920  NA 124* 127*  K 4.3 4.2  CL 87*  --   CO2 19*  --   GLUCOSE 1,185*  --   BUN 25*  --  CREATININE  2.24*  --   CALCIUM 9.6  --   GFRNONAA 41*  --   ANIONGAP 18*  --     Recent Labs  Lab 03/28/21 1636  PROT 7.8  ALBUMIN 3.8  AST 53*  ALT 55*  ALKPHOS 141*  BILITOT 1.1   Hematology Recent Labs  Lab 03/28/21 1636 03/28/21 1920  WBC 6.7  --   RBC 4.02*  --   HGB 9.3* 10.2*  HCT 27.9* 30.0*  MCV 69.4*  --   MCH 23.1*  --   MCHC 33.3  --   RDW 16.6*  --   PLT 460*  --    BNPNo results for input(s): BNP, PROBNP in the last 168 hours.  DDimer No results for input(s): DDIMER in the last 168 hours.   Radiology/Studies:  CT KNEE RIGHT WO CONTRAST  Result Date: 03/28/2021 CLINICAL DATA:  Right knee pain when walking.  Fall 2 weeks ago. EXAM: CT OF THE right KNEE WITHOUT CONTRAST TECHNIQUE: Multidetector CT imaging of the right knee was performed according to the standard protocol. Multiplanar CT image reconstructions were also generated. COMPARISON:  None. FINDINGS: Bones/Joint/Cartilage Relatively nondisplaced four part fracture of the tibial plateau with intersecting fracture planes in the vicinity of the tibial spine and articular surface of the lateral tibial plateau and extending to the medial and lateral metaphysis. Mildly comminuted primarily oblique fracture of the proximal fibular head. Small lipohemarthrosis. Ligaments Suboptimally assessed by CT. Muscles and Tendons Unremarkable Soft tissues Low-level edema tracks in the popliteal space and in the regional subcutaneous tissues. IMPRESSION: 1. Relatively nondisplaced 4 part fracture of the tibial plateau (type V in the Hohl and [employer]). Mildly comminuted proximal fibular head fracture. 2. Infiltrative edema in the popliteal space and subcutaneous tissues. Electronically Signed   By: Gaylyn Rong M.D.   On: 03/28/2021 11:57     Assessment and Plan:   1. S/p OHT- He presents with right knee pain 2/2 known tibial fracture and DKA. He is >1 year out from [facility] with complete one year evaluation  including RHC, LHC and biopsy which were all unremarkable. He reports good compliance with his immunosuppression regimen and has no signs or symptoms of rejection or heart failure at this time. No further work-up indicated prior to right knee surgery. It is imperative that his immunosuppressants be continued perioperatively.  - Check tacrolimus level ~7am prior to morning dose  - Continue envarsus 5mg  and prednisone 5mg      Risk Assessment/Risk Scores:    For questions or updates, please contact [facility] Please consult [email or web address] for contact info under    Signed, , MD  03/28/2021 9:59 PM

## 2021-03-28 NOTE — ED Notes (Signed)
CBG still reading >600, per endotool insulin drip continues at 4.8 u/hr

## 2021-03-29 ENCOUNTER — Encounter (HOSPITAL_COMMUNITY): Payer: Medicaid Other

## 2021-03-29 ENCOUNTER — Inpatient Hospital Stay (HOSPITAL_COMMUNITY): Payer: No Typology Code available for payment source

## 2021-03-29 ENCOUNTER — Ambulatory Visit (HOSPITAL_COMMUNITY): Admission: RE | Admit: 2021-03-29 | Payer: Medicaid Other | Source: Home / Self Care | Admitting: Student

## 2021-03-29 ENCOUNTER — Encounter (HOSPITAL_COMMUNITY): Payer: Self-pay | Admitting: Internal Medicine

## 2021-03-29 DIAGNOSIS — S82121A Displaced fracture of lateral condyle of right tibia, initial encounter for closed fracture: Secondary | ICD-10-CM | POA: Diagnosis not present

## 2021-03-29 HISTORY — DX: Essential (primary) hypertension: I10

## 2021-03-29 LAB — CBG MONITORING, ED
Glucose-Capillary: 117 mg/dL — ABNORMAL HIGH (ref 70–99)
Glucose-Capillary: 137 mg/dL — ABNORMAL HIGH (ref 70–99)
Glucose-Capillary: 162 mg/dL — ABNORMAL HIGH (ref 70–99)
Glucose-Capillary: 200 mg/dL — ABNORMAL HIGH (ref 70–99)
Glucose-Capillary: 105 mg/dL — ABNORMAL HIGH (ref 70–99)

## 2021-03-29 LAB — MAGNESIUM: Magnesium: 2.1 mg/dL (ref 1.7–2.4)

## 2021-03-29 LAB — BASIC METABOLIC PANEL
Anion gap: 10 (ref 5–15)
BUN: 15 mg/dL (ref 6–20)
CO2: 27 mmol/L (ref 22–32)
Calcium: 9.3 mg/dL (ref 8.9–10.3)
Chloride: 100 mmol/L (ref 98–111)
Creatinine, Ser: 1.47 mg/dL — ABNORMAL HIGH (ref 0.61–1.24)
GFR, Estimated: >60 mL/min (ref >60)
Glucose, Bld: 156 mg/dL — ABNORMAL HIGH (ref 70–99)
Potassium: 3.6 mmol/L (ref 3.5–5.1)
Sodium: 137 mmol/L (ref 135–145)

## 2021-03-29 LAB — GLUCOSE, CAPILLARY
Glucose-Capillary: 113 mg/dL — ABNORMAL HIGH (ref 70–99)
Glucose-Capillary: 161 mg/dL — ABNORMAL HIGH (ref 70–99)

## 2021-03-29 LAB — CBC
HCT: 23 % — ABNORMAL LOW (ref 39.0–52.0)
Hemoglobin: 7.7 g/dL — ABNORMAL LOW (ref 13.0–17.0)
MCH: 23.1 pg — ABNORMAL LOW (ref 26.0–34.0)
MCHC: 33.5 g/dL (ref 30.0–36.0)
MCV: 69.1 fL — ABNORMAL LOW (ref 80.0–100.0)
Platelets: 435 10*3/uL — ABNORMAL HIGH (ref 150–400)
RBC: 3.33 MIL/uL — ABNORMAL LOW (ref 4.22–5.81)
RDW: 16.1 % — ABNORMAL HIGH (ref 11.5–15.5)
WBC: 6.7 10*3/uL (ref 4.0–10.5)
nRBC: 0 % (ref 0.0–0.2)

## 2021-03-29 LAB — PHOSPHORUS: Phosphorus: 3.4 mg/dL (ref 2.5–4.6)

## 2021-03-29 LAB — TYPE AND SCREEN
ABO/RH(D): O NEG
Antibody Screen: NEGATIVE

## 2021-03-29 LAB — HEMOGLOBIN AND HEMATOCRIT, BLOOD
HCT: 25.6 % — ABNORMAL LOW (ref 39.0–52.0)
Hemoglobin: 8.5 g/dL — ABNORMAL LOW (ref 13.0–17.0)

## 2021-03-29 LAB — HEMOGLOBIN A1C
Hgb A1c MFr Bld: 10.6 % — ABNORMAL HIGH (ref 4.8–5.6)
Mean Plasma Glucose: 257.52 mg/dL

## 2021-03-29 LAB — MRSA PCR SCREENING: MRSA by PCR: NEGATIVE

## 2021-03-29 LAB — BETA-HYDROXYBUTYRIC ACID: Beta-Hydroxybutyric Acid: 0.3 mmol/L — ABNORMAL HIGH (ref 0.05–0.27)

## 2021-03-29 MED ORDER — INSULIN ASPART 100 UNIT/ML IJ SOLN
3.0000 [IU] | Freq: Three times a day (TID) | INTRAMUSCULAR | Status: DC
  Administered 2021-03-29 – 2021-04-01 (×6): 3 [IU] via SUBCUTANEOUS

## 2021-03-29 MED ORDER — ENOXAPARIN SODIUM 40 MG/0.4ML IJ SOSY
40.0000 mg | PREFILLED_SYRINGE | Freq: Every day | INTRAMUSCULAR | Status: DC
  Administered 2021-03-29: 40 mg via SUBCUTANEOUS
  Filled 2021-03-29 (×2): qty 0.4

## 2021-03-29 MED ORDER — INSULIN GLARGINE 100 UNIT/ML ~~LOC~~ SOLN
14.0000 [IU] | Freq: Every day | SUBCUTANEOUS | Status: DC
  Administered 2021-03-30 – 2021-03-31 (×2): 14 [IU] via SUBCUTANEOUS
  Filled 2021-03-29 (×2): qty 0.14

## 2021-03-29 MED ORDER — INSULIN GLARGINE 100 UNIT/ML ~~LOC~~ SOLN
10.0000 [IU] | Freq: Every day | SUBCUTANEOUS | Status: DC
  Administered 2021-03-29: 10 [IU] via SUBCUTANEOUS
  Filled 2021-03-29: qty 0.1

## 2021-03-29 MED ORDER — DEXTROSE IN LACTATED RINGERS 5 % IV SOLN: INTRAVENOUS | Status: AC

## 2021-03-29 MED ORDER — INSULIN REGULAR(HUMAN) IN NACL 100-0.9 UT/100ML-% IV SOLN: INTRAVENOUS | Status: AC

## 2021-03-29 MED ORDER — LACTATED RINGERS IV SOLN: INTRAVENOUS | Status: DC

## 2021-03-29 MED ORDER — SODIUM CHLORIDE 0.45 % IV SOLN: INTRAVENOUS | Status: DC

## 2021-03-29 MED ORDER — INSULIN ASPART 100 UNIT/ML IJ SOLN
0.0000 [IU] | Freq: Every day | INTRAMUSCULAR | Status: DC
  Administered 2021-04-02: 3 [IU] via SUBCUTANEOUS

## 2021-03-29 MED ORDER — FERROUS SULFATE 325 (65 FE) MG PO TABS
325.0000 mg | ORAL_TABLET | Freq: Every day | ORAL | Status: DC
  Administered 2021-03-30 – 2021-04-03 (×4): 325 mg via ORAL
  Filled 2021-03-29 (×4): qty 1

## 2021-03-29 MED ORDER — NYSTATIN 100000 UNIT/ML MT SUSP
5.0000 mL | Freq: Four times a day (QID) | OROMUCOSAL | Status: DC
  Administered 2021-03-29 – 2021-04-03 (×17): 500000 [IU] via ORAL
  Filled 2021-03-29 (×18): qty 5

## 2021-03-29 MED ORDER — SODIUM CHLORIDE 0.9 % IV SOLN: INTRAVENOUS | Status: DC

## 2021-03-29 MED ORDER — INSULIN ASPART 100 UNIT/ML IJ SOLN
0.0000 [IU] | Freq: Three times a day (TID) | INTRAMUSCULAR | Status: DC
  Administered 2021-03-29 – 2021-03-30 (×2): 3 [IU] via SUBCUTANEOUS
  Administered 2021-03-31 – 2021-04-01 (×3): 2 [IU] via SUBCUTANEOUS
  Administered 2021-04-02: 5 [IU] via SUBCUTANEOUS
  Administered 2021-04-02: 7 [IU] via SUBCUTANEOUS
  Administered 2021-04-03: 5 [IU] via SUBCUTANEOUS

## 2021-03-29 NOTE — Progress Notes (Signed)
PROGRESS NOTE    Manuel Baker  ALP:[id number] DOB: 07/26/1996 DOA: 03/28/2021 PCP: Demaris Callander [place] [employer] [facility]   Brief Narrative: 25 year old with past medical history significant for type I diabetic, gastroparesis, Hx Epstein anomaly S/P heart transplant at [facility], hypertension, GERD, chronic anxiety depression, THC use and cannabis hyperemesis syndrome, seizure disorder, history of CVA who presents to [facility], ED via EMS due to nausea vomiting polyuria abdominal pain 1 day duration.  Patient has been having pain in his right knee after a fall 6-day prior to admission.  He supposed to have surgery with Dr. Jena Gauss on 5/5.  The morning of admission he wake up with nausea vomiting.  Evaluation in the ED patient was found to be in DKA bicarb 19, anion gap 18, pH 7.5, mild transaminases    Assessment & Plan:   Principal Problem:   Closed fracture of lateral portion of right tibial plateau Active Problems:   DKA, type 1 (HCC)  1-DKA type I Patient was started on IV fluids insulin Gtt.  Gap close, he was transition to lantus , meal coverage and SSI./  Report cough, check chest x ray.    2-Right Knee , closed fracture of lateral portion of right tibia plateau.  He was suppose to have surgery to day with Dr Jena Gauss.  Ortho was made aware of admission.  Cardiology has clear patient for surgery.   3-Right LE pain.  Doppler   4-Pseudohyponatremia; hyperglycemia corrected.  Resolved.   5-AKI: Likely related to DKA: Continue baseline 1.2 Presents with a creatinine of 2.2 Improved with IV fluids Cr down to 1.4.   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   7-History of heart transplant: Heart failure Cardiology  following Continue with prednisone and tacrolimus.   8-Seizure disorder: Resume Vimpat  9-Hypertension: Continue with Norvasc, Cardura   GERD; Continue with PPI.  THC use disorder/history  of cannabis hyperemesis syndrome Counseled on THC cessation at bedside.  Oral thrush: Start nystatin   Estimated body mass index is 16.37 kg/m as calculated from the following:   Height as of 02/20/21: 5\' 7"  (1.702 m).   Weight as of an earlier encounter on 03/28/21: 47.4 kg.   DVT prophylaxis: Lovenox Code Status: Full code Family Communication: Discussed with patient Disposition Plan:  Status is: Inpatient  Remains inpatient appropriate because:IV treatments appropriate due to intensity of illness or inability to take PO   Dispo:  Patient From: Home  Planned Disposition: Home  Medically stable for discharge: No          Consultants:   Cardiology  Ortho  Procedures:     Antimicrobials:    Subjective: He does not have an appetite, he report mild cough.  He denies dysuria.   Objective: Vitals:   03/29/21 0806 03/29/21 0900 03/29/21 1119 03/29/21 1214  BP: (!) 143/81 (!) 161/95 130/78 138/74  Pulse: 93 (!) 102 90 91  Resp: 16  18 18   Temp:      TempSrc:      SpO2: 97% 100% 100% 100%    Intake/Output Summary (Last 24 hours) at 03/29/2021 1404 Last data filed at 03/28/2021 2200 Gross per 24 hour  Intake 1148 ml  Output --  Net 1148 ml   There were no vitals filed for this visit.  Examination:  General exam: Appears calm and comfortable  Respiratory system: Clear to auscultation. Respiratory effort normal. Cardiovascular system: S1 & S2 heard, RRR. No JVD,  murmurs, rubs, gallops or clicks. No pedal edema. Gastrointestinal system: Abdomen is nondistended, soft and nontender. No organomegaly or masses felt. Normal bowel sounds heard. Central nervous system: Alert and oriented.  Extremities: Symmetric 5 x 5 power.   Data Reviewed: I have personally reviewed following labs and imaging studies  CBC: Recent Labs  Lab 03/28/21 1636 03/28/21 1920 03/29/21 0303 03/29/21 0640  WBC 6.7  --  6.7  --   HGB 9.3* 10.2* 7.7* 8.5*  HCT 27.9* 30.0* 23.0*  25.6*  MCV 69.4*  --  69.1*  --   PLT 460*  --  435*  --    Basic Metabolic Panel: Recent Labs  Lab 03/28/21 1636 03/28/21 1920 03/28/21 2138 03/29/21 0303  NA 124* 127* 135 137  K 4.3 4.2 4.0 3.6  CL 87*  --  101 100  CO2 19*  --  25 27  GLUCOSE 1,185*  --  545* 156*  BUN 25*  --  20 15  CREATININE 2.24*  --  2.03* 1.47*  CALCIUM 9.6  --  8.8* 9.3  MG  --   --   --  2.1  PHOS  --   --   --  3.4   GFR: Estimated Creatinine Clearance: 52.4 mL/min (A) (by C-G formula based on SCr of 1.47 mg/dL (H)). Liver Function Tests: Recent Labs  Lab 03/28/21 1636  AST 53*  ALT 55*  ALKPHOS 141*  BILITOT 1.1  PROT 7.8  ALBUMIN 3.8   Recent Labs  Lab 03/28/21 1636  LIPASE 31   No results for input(s): AMMONIA in the last 168 hours. Coagulation Profile: No results for input(s): INR, PROTIME in the last 168 hours. Cardiac Enzymes: No results for input(s): CKTOTAL, CKMB, CKMBINDEX, TROPONINI in the last 168 hours. BNP (last 3 results) No results for input(s): PROBNP in the last 8760 hours. HbA1C: Recent Labs    03/29/21 0640  HGBA1C 10.6*   CBG: Recent Labs  Lab 03/29/21 0326 03/29/21 0445 03/29/21 0610 03/29/21 0805 03/29/21 1213  GLUCAP 117* 137* 162* 200* 105*   Lipid Profile: No results for input(s): CHOL, HDL, LDLCALC, TRIG, CHOLHDL, LDLDIRECT in the last 72 hours. Thyroid Function Tests: No results for input(s): TSH, T4TOTAL, FREET4, T3FREE, THYROIDAB in the last 72 hours. Anemia Panel: Recent Labs    03/28/21 2229  VITAMINB12 618  FOLATE 12.5  FERRITIN 81  TIBC 357  IRON 36*   Sepsis Labs: No results for input(s): PROCALCITON, LATICACIDVEN in the last 168 hours.  Recent Results (from the past 240 hour(s))  SARS CORONAVIRUS 2 (TAT 6-24 HRS) Nasopharyngeal Nasopharyngeal Swab     Status: None   Collection Time: 03/28/21 12:10 PM   Specimen: Nasopharyngeal Swab  Result Value Ref Range Status   SARS Coronavirus 2 NEGATIVE NEGATIVE Final     Comment: (NOTE) SARS-CoV-2 target nucleic acids are NOT DETECTED.  The SARS-CoV-2 RNA is generally detectable in upper and lower respiratory specimens during the acute phase of infection. Negative results do not preclude SARS-CoV-2 infection, do not rule out co-infections with other pathogens, and should not be used as the sole basis for treatment or other patient management decisions. Negative results must be combined with clinical observations, patient history, and epidemiological information. The expected result is Negative.  Fact Sheet for Patients: HairSlick.no  Fact Sheet for Healthcare Providers: quierodirigir.com  This test is not yet approved or cleared by the [place] FDA and  has been authorized for detection and/or diagnosis of SARS-CoV-2 by  FDA under an Emergency Use Authorization (EUA). This EUA will remain  in effect (meaning this test can be used) for the duration of the COVID-19 declaration under Se ction 564(b)(1) of the Act, 21 U.S.C. section 360bbb-3(b)(1), unless the authorization is terminated or revoked sooner.  Performed at [facility], [address] [place], [place] [address]          Radiology Studies: DG Chest 2 View  Result Date: 03/29/2021 CLINICAL DATA:  Nausea/vomiting EXAM: CHEST - 2 VIEW COMPARISON:  Radiograph 02/19/2021, chest CT 12/02/2020 FINDINGS: Unchanged cardiomediastinal silhouette. There is no new focal airspace disease. There is no pleural effusion or pneumothorax. There is no acute osseous abnormality. IMPRESSION: No evidence of acute cardiopulmonary disease. Electronically Signed   By: Caprice Renshaw   On: 03/29/2021 09:42   CT KNEE RIGHT WO CONTRAST  Result Date: 03/28/2021 CLINICAL DATA:  Right knee pain when walking.  Fall 2 weeks ago. EXAM: CT OF THE right KNEE WITHOUT CONTRAST TECHNIQUE: Multidetector CT imaging of the right knee was performed according to the  standard protocol. Multiplanar CT image reconstructions were also generated. COMPARISON:  None. FINDINGS: Bones/Joint/Cartilage Relatively nondisplaced four part fracture of the tibial plateau with intersecting fracture planes in the vicinity of the tibial spine and articular surface of the lateral tibial plateau and extending to the medial and lateral metaphysis. Mildly comminuted primarily oblique fracture of the proximal fibular head. Small lipohemarthrosis. Ligaments Suboptimally assessed by CT. Muscles and Tendons Unremarkable Soft tissues Low-level edema tracks in the popliteal space and in the regional subcutaneous tissues. IMPRESSION: 1. Relatively nondisplaced 4 part fracture of the tibial plateau (type V in the Hohl and [employer]). Mildly comminuted proximal fibular head fracture. 2. Infiltrative edema in the popliteal space and subcutaneous tissues. Electronically Signed   By: Gaylyn Rong M.D.   On: 03/28/2021 11:57        Scheduled Meds: . amLODipine  10 mg Oral Daily  . doxazosin  1 mg Oral QHS  . enoxaparin (LOVENOX) injection  40 mg Subcutaneous Daily  . insulin aspart  0-5 Units Subcutaneous QHS  . insulin aspart  0-9 Units Subcutaneous TID WC  . insulin aspart  3 Units Subcutaneous TID WC  . [START ON 03/30/2021] insulin glargine  14 Units Subcutaneous Daily  . lacosamide  50 mg Oral BID  . nystatin  5 mL Oral QID  . pantoprazole  40 mg Oral BID  . pravastatin  40 mg Oral QHS  . predniSONE  2.5 mg Oral Q breakfast  . senna-docusate  2 tablet Oral BID  . sertraline  50 mg Oral Daily  . Tacrolimus ER  8 mg Oral QAC breakfast  . valGANciclovir  900 mg Oral QHS   Continuous Infusions: . sodium chloride 100 mL/hr at 03/29/21 0634     LOS: 1 day    Time spent: 35 minutes    Rogue Rafalski A Herta Hink, MD [facility]   If 7PM-7AM, please contact night-coverage [email or web address]  03/29/2021, 2:04 PM

## 2021-03-29 NOTE — Progress Notes (Signed)
Manuel Baker [id number] Admission Data: 03/29/2021 3:56 PM Attending Provider: Darlin Drop, DO  OLM:[id number], [facility] [employer] [facility]  [name] is a 25 y.o. male patient admitted from ED awake, alert  & orientated  X 3,  Full Code, VSS - Blood pressure (!) 148/86, pulse 100, temperature 98.2 F (36.8 C), temperature source Oral, resp. rate 17, SpO2 100 %., O2 No c/o shortness of breath, no c/o chest pain, no distress noted. Tele # [id number] placed and pt is currently running:sinus tachycardia.  Pt orientation to unit, room and routine. Information packet given to patient/family.  Admission INP armband ID verified with patient/family, and in place. SR up x 2, fall risk assessment complete with patient and family verbalizing understanding of risks associated with falls. Pt verbalizes an understanding of how to use the call bell and to call for help before getting out of bed.  Skin, clean-dry- intact without evidence of bruising, or skin tears.    Will continue to monitor and assist as needed.  Lyndal Pulley, RN 03/29/2021 3:56 PM

## 2021-03-29 NOTE — ED Notes (Signed)
Breakfast Ordered 

## 2021-03-29 NOTE — ED Notes (Signed)
CGB 164

## 2021-03-29 NOTE — ED Notes (Signed)
CBG 117 

## 2021-03-29 NOTE — ED Notes (Signed)
cgb 136

## 2021-03-29 NOTE — Progress Notes (Signed)
Ortho Trauma Note  Will plan for ORIF tomorrow AM. NPO after midnight.  Roby Lofts, MD [facility] [phone] (office) [email or web address]

## 2021-03-29 NOTE — TOC Initial Note (Signed)
Transition of Care [facility]) - Initial/Assessment Note    Patient Details  Name: Manuel Baker MRN: [id number] Date of Birth: 1996/02/26  Transition of Care [facility]) CM/SW Contact:    Lockie Pares, RN Phone Number: 03/29/2021, 3:37 PM  Clinical Narrative:                  Admitted for N&V DKA history of type 1 Dm heart transpolant. Was to have tibeal fracture surgery repair today. However started to have vomiting this Am took TCh for vomiting and oxy for pain. Presented with DKA received fluids. Needs cardiac clearance for surgery.  May need DME after surgery and PT services. CMw ill follow for needs       Patient Goals and CMS Choice        Expected Discharge Plan and Services     Discharge Planning Services: CM Consult                                          Prior Living Arrangements/Services   Lives with:: Self Patient language and need for interpreter reviewed:: Yes        Need for Family Participation in Patient Care: Yes (Comment) Care giver support system in place?: Yes (comment)   Criminal Activity/Legal Involvement Pertinent to Current Situation/Hospitalization: No - Comment as needed  Activities of Daily Living      Permission Sought/Granted Permission sought to share information with : Case Manager                Emotional Assessment Appearance:: Appears stated age,Well-Groomed     Orientation: : Oriented to Self,Oriented to Place Alcohol / Substance Use: Illicit Drugs Psych Involvement: No (comment)  Admission diagnosis:  DKA, type 1 (HCC) [E10.10] Closed fracture of right tibial plateau, initial encounter [S82.141A] Diabetic ketoacidosis without coma associated with diabetes mellitus due to underlying condition (HCC) [E08.10] Heart transplant recipient [facility]) [Z94.1] Patient Active Problem List   Diagnosis Date Noted  . DKA, type 1 (HCC) 03/28/2021  . Closed fracture of lateral portion of right tibial plateau 03/27/2021  .  Diabetic ketoacidosis (HCC) 02/19/2021  . DKA (diabetic ketoacidosis) (HCC) 02/19/2021  . SIRS (systemic inflammatory response syndrome) (HCC) 02/19/2021  . AKI (acute kidney injury) (HCC) 02/19/2021  . Prolonged QT interval 02/19/2021  . Intractable nausea and vomiting 12/07/2020  . Protein-calorie malnutrition, severe 12/05/2020  . Hyperglycemia 12/03/2020  . History of stroke 12/03/2020  . Esophagitis 12/03/2020  . Elevated lactic acid level 12/03/2020  . Diabetic gastroparesis (HCC) 12/03/2020  . Dehydration 12/03/2020  . Non-intractable vomiting   . Hematemesis with nausea   . Septic embolism (HCC)   . Libman Sacks endocarditis (HCC)   . Leucocytosis   . [place] endocarditis   . 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 Uncontrolled type 1 diabetes mellitus with hyperglycemia (HCC) 01/03/2018  . Seizure disorder (HCC) 01/03/2018  . Stroke (cerebrum) (HCC) 01/03/2018  . Stroke (HCC) 01/03/2018  . Ebstein's anomaly    PCP:  Demaris Callander [facility]:   [facility] [place], [place] [address] [place] [address] Phone: [phone] Fax: [phone]     Social Determinants of Health (SDOH) Interventions    Readmission Risk Interventions No flowsheet data found.

## 2021-03-29 NOTE — Progress Notes (Signed)
Progress Note  Patient Name: Manuel Baker Date of Encounter: 03/29/2021  Primary Cardiologist: Kristeen Miss, MD   Subjective   Nausea and pain, no chest pain or SOB.  Inpatient Medications    Scheduled Meds: . amLODipine  10 mg Oral Daily  . doxazosin  1 mg Oral QHS  . enoxaparin (LOVENOX) injection  40 mg Subcutaneous Daily  . insulin aspart  0-5 Units Subcutaneous QHS  . insulin aspart  0-9 Units Subcutaneous TID WC  . insulin aspart  3 Units Subcutaneous TID WC  . [START ON 03/30/2021] insulin glargine  14 Units Subcutaneous Daily  . lacosamide  50 mg Oral BID  . nystatin  5 mL Oral QID  . pantoprazole  40 mg Oral BID  . pravastatin  40 mg Oral QHS  . predniSONE  2.5 mg Oral Q breakfast  . senna-docusate  2 tablet Oral BID  . sertraline  50 mg Oral Daily  . Tacrolimus ER  8 mg Oral QAC breakfast  . valGANciclovir  900 mg Oral QHS   Continuous Infusions: . sodium chloride 100 mL/hr at 03/29/21 0634   PRN Meds: acetaminophen, dextrose, HYDROmorphone (DILAUDID) injection, melatonin, oxyCODONE, prochlorperazine   Vital Signs    Vitals:   03/29/21 0400 03/29/21 0600 03/29/21 0806 03/29/21 0900  BP: 133/78 (!) 142/84 (!) 143/81 (!) 161/95  Pulse: 81 84 93 (!) 102  Resp: 15 18 16    Temp:  97.8 F (36.6 C)    TempSrc:  Oral    SpO2: 100% 100% 97% 100%    Intake/Output Summary (Last 24 hours) at 03/29/2021 1001 Last data filed at 03/28/2021 2200 Gross per 24 hour  Intake 1148 ml  Output --  Net 1148 ml   There were no vitals filed for this visit.  Telemetry    ST - Personally Reviewed  ECG    No new - Personally Reviewed  Physical Exam   GEN: No acute distress.   Neck: No JVD Cardiac: regular rhythm, tachycardic rate, no murmurs, rubs, or gallops.  Respiratory: Clear to auscultation bilaterally. GI: Soft, nontender, non-distended  MS: No edema; No deformity. Neuro:  Nonfocal  Psych: Normal affect   Labs    Chemistry Recent Labs  Lab  03/28/21 1636 03/28/21 1920 03/28/21 2138 03/29/21 0303  NA 124* 127* 135 137  K 4.3 4.2 4.0 3.6  CL 87*  --  101 100  CO2 19*  --  25 27  GLUCOSE 1,185*  --  545* 156*  BUN 25*  --  20 15  CREATININE 2.24*  --  2.03* 1.47*  CALCIUM 9.6  --  8.8* 9.3  PROT 7.8  --   --   --   ALBUMIN 3.8  --   --   --   AST 53*  --   --   --   ALT 55*  --   --   --   ALKPHOS 141*  --   --   --   BILITOT 1.1  --   --   --   GFRNONAA 41*  --  46* >60  ANIONGAP 18*  --  9 10     Hematology Recent Labs  Lab 03/28/21 1636 03/28/21 1920 03/29/21 0303 03/29/21 0640  WBC 6.7  --  6.7  --   RBC 4.02*  --  3.33*  --   HGB 9.3* 10.2* 7.7* 8.5*  HCT 27.9* 30.0* 23.0* 25.6*  MCV 69.4*  --  69.1*  --  MCH 23.1*  --  23.1*  --   MCHC 33.3  --  33.5  --   RDW 16.6*  --  16.1*  --   PLT 460*  --  435*  --     Cardiac EnzymesNo results for input(s): TROPONINI in the last 168 hours. No results for input(s): TROPIPOC in the last 168 hours.   BNPNo results for input(s): BNP, PROBNP in the last 168 hours.   DDimer No results for input(s): DDIMER in the last 168 hours.   Radiology    DG Chest 2 View  Result Date: 03/29/2021 CLINICAL DATA:  Nausea/vomiting EXAM: CHEST - 2 VIEW COMPARISON:  Radiograph 02/19/2021, chest CT 12/02/2020 FINDINGS: Unchanged cardiomediastinal silhouette. There is no new focal airspace disease. There is no pleural effusion or pneumothorax. There is no acute osseous abnormality. IMPRESSION: No evidence of acute cardiopulmonary disease. Electronically Signed   By: Caprice Renshaw   On: 03/29/2021 09:42   CT KNEE RIGHT WO CONTRAST  Result Date: 03/28/2021 CLINICAL DATA:  Right knee pain when walking.  Fall 2 weeks ago. EXAM: CT OF THE right KNEE WITHOUT CONTRAST TECHNIQUE: Multidetector CT imaging of the right knee was performed according to the standard protocol. Multiplanar CT image reconstructions were also generated. COMPARISON:  None. FINDINGS: Bones/Joint/Cartilage Relatively  nondisplaced four part fracture of the tibial plateau with intersecting fracture planes in the vicinity of the tibial spine and articular surface of the lateral tibial plateau and extending to the medial and lateral metaphysis. Mildly comminuted primarily oblique fracture of the proximal fibular head. Small lipohemarthrosis. Ligaments Suboptimally assessed by CT. Muscles and Tendons Unremarkable Soft tissues Low-level edema tracks in the popliteal space and in the regional subcutaneous tissues. IMPRESSION: 1. Relatively nondisplaced 4 part fracture of the tibial plateau (type V in the [employer]). Mildly comminuted proximal fibular head fracture. 2. Infiltrative edema in the popliteal space and subcutaneous tissues. Electronically Signed   By: Gaylyn Rong M.D.   On: 03/28/2021 11:57    Cardiac Studies   None this admission  Patient Profile     25 y.o. 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 Margo Aye.    Assessment & Plan   Principal Problem:   Closed fracture of lateral portion of right tibial plateau Active Problems:   DKA, type 1 (HCC)   No change to recommendations from initial consult note. Continue immunosuppressant medications. Follow tacrolimus level.      For questions or updates, please contact [facility] Please consult [email or web address] for contact info under        Signed, Parke Poisson, MD  03/29/2021, 10:01 AM

## 2021-03-29 NOTE — ED Notes (Signed)
cbg 200 

## 2021-03-30 ENCOUNTER — Encounter (HOSPITAL_COMMUNITY): Payer: Self-pay | Admitting: Internal Medicine

## 2021-03-30 ENCOUNTER — Inpatient Hospital Stay (HOSPITAL_COMMUNITY): Payer: No Typology Code available for payment source | Admitting: Vascular Surgery

## 2021-03-30 ENCOUNTER — Inpatient Hospital Stay (HOSPITAL_COMMUNITY): Payer: No Typology Code available for payment source

## 2021-03-30 ENCOUNTER — Encounter (HOSPITAL_COMMUNITY)
Admission: EM | Disposition: A | Discharge status: Home / Self Care | Payer: Self-pay | Source: Home / Self Care | Attending: Internal Medicine

## 2021-03-30 DIAGNOSIS — S82121A Displaced fracture of lateral condyle of right tibia, initial encounter for closed fracture: Secondary | ICD-10-CM | POA: Diagnosis not present

## 2021-03-30 DIAGNOSIS — S82141A Displaced bicondylar fracture of right tibia, initial encounter for closed fracture: Secondary | ICD-10-CM

## 2021-03-30 HISTORY — PX: ORIF TIBIA PLATEAU: SHX2132

## 2021-03-30 LAB — GLUCOSE, CAPILLARY
Glucose-Capillary: 96 mg/dL (ref 70–99)
Glucose-Capillary: 176 mg/dL — ABNORMAL HIGH (ref 70–99)
Glucose-Capillary: 257 mg/dL — ABNORMAL HIGH (ref 70–99)
Glucose-Capillary: 238 mg/dL — ABNORMAL HIGH (ref 70–99)
Glucose-Capillary: 250 mg/dL — ABNORMAL HIGH (ref 70–99)
Glucose-Capillary: 112 mg/dL — ABNORMAL HIGH (ref 70–99)
Glucose-Capillary: 99 mg/dL (ref 70–99)

## 2021-03-30 LAB — BASIC METABOLIC PANEL
Anion gap: 8 (ref 5–15)
BUN: 7 mg/dL (ref 6–20)
CO2: 26 mmol/L (ref 22–32)
Calcium: 8.8 mg/dL — ABNORMAL LOW (ref 8.9–10.3)
Chloride: 103 mmol/L (ref 98–111)
Creatinine, Ser: 1.23 mg/dL (ref 0.61–1.24)
GFR, Estimated: >60 mL/min (ref >60)
Glucose, Bld: 136 mg/dL — ABNORMAL HIGH (ref 70–99)
Potassium: 3.2 mmol/L — ABNORMAL LOW (ref 3.5–5.1)
Sodium: 137 mmol/L (ref 135–145)

## 2021-03-30 LAB — POCT I-STAT, CHEM 8
BUN: 5 mg/dL — ABNORMAL LOW (ref 6–20)
Calcium, Ion: 1.19 mmol/L (ref 1.15–1.40)
Chloride: 101 mmol/L (ref 98–111)
Creatinine, Ser: 0.9 mg/dL (ref 0.61–1.24)
Glucose, Bld: 213 mg/dL — ABNORMAL HIGH (ref 70–99)
HCT: 33 % — ABNORMAL LOW (ref 39.0–52.0)
Hemoglobin: 11.2 g/dL — ABNORMAL LOW (ref 13.0–17.0)
Potassium: 3.5 mmol/L (ref 3.5–5.1)
Sodium: 136 mmol/L (ref 135–145)
TCO2: 22 mmol/L (ref 22–32)

## 2021-03-30 LAB — CBC
HCT: 24.3 % — ABNORMAL LOW (ref 39.0–52.0)
Hemoglobin: 8 g/dL — ABNORMAL LOW (ref 13.0–17.0)
MCH: 23.1 pg — ABNORMAL LOW (ref 26.0–34.0)
MCHC: 32.9 g/dL (ref 30.0–36.0)
MCV: 70 fL — ABNORMAL LOW (ref 80.0–100.0)
Platelets: 457 10*3/uL — ABNORMAL HIGH (ref 150–400)
RBC: 3.47 MIL/uL — ABNORMAL LOW (ref 4.22–5.81)
RDW: 16.5 % — ABNORMAL HIGH (ref 11.5–15.5)
WBC: 8.9 10*3/uL (ref 4.0–10.5)
nRBC: 0 % (ref 0.0–0.2)

## 2021-03-30 LAB — C-PEPTIDE: C-Peptide: <0.1 ng/mL — ABNORMAL LOW (ref 1.1–4.4)

## 2021-03-30 SURGERY — OPEN REDUCTION INTERNAL FIXATION (ORIF) TIBIAL PLATEAU
Anesthesia: General | Laterality: Right

## 2021-03-30 MED ORDER — FENTANYL CITRATE (PF) 100 MCG/2ML IJ SOLN
INTRAMUSCULAR | Status: AC
  Filled 2021-03-30: qty 2

## 2021-03-30 MED ORDER — PROPOFOL 10 MG/ML IV BOLUS
INTRAVENOUS | Status: DC | PRN
  Administered 2021-03-30: 70 mg via INTRAVENOUS
  Administered 2021-03-30: 30 mg via INTRAVENOUS

## 2021-03-30 MED ORDER — ENOXAPARIN SODIUM 40 MG/0.4ML IJ SOSY
40.0000 mg | PREFILLED_SYRINGE | Freq: Every day | INTRAMUSCULAR | Status: DC
  Administered 2021-03-31 – 2021-04-03 (×4): 40 mg via SUBCUTANEOUS
  Filled 2021-03-30 (×4): qty 0.4

## 2021-03-30 MED ORDER — VANCOMYCIN HCL 1000 MG IV SOLR
INTRAVENOUS | Status: AC
  Filled 2021-03-30: qty 1000

## 2021-03-30 MED ORDER — ETOMIDATE 2 MG/ML IV SOLN
INTRAVENOUS | Status: AC
  Filled 2021-03-30: qty 10

## 2021-03-30 MED ORDER — OXYCODONE HCL 5 MG PO TABS: 5.0000 mg | ORAL_TABLET | Freq: Once | ORAL | Status: DC | PRN

## 2021-03-30 MED ORDER — SUGAMMADEX SODIUM 200 MG/2ML IV SOLN
INTRAVENOUS | Status: DC | PRN
  Administered 2021-03-30: 150 mg via INTRAVENOUS

## 2021-03-30 MED ORDER — MIDAZOLAM HCL 5 MG/5ML IJ SOLN
INTRAMUSCULAR | Status: DC | PRN
  Administered 2021-03-30: 2 mg via INTRAVENOUS

## 2021-03-30 MED ORDER — OXYCODONE HCL 5 MG PO TABS
5.0000 mg | ORAL_TABLET | Freq: Four times a day (QID) | ORAL | Status: DC | PRN
  Administered 2021-03-30 – 2021-04-02 (×12): 10 mg via ORAL
  Administered 2021-04-03: 5 mg via ORAL
  Administered 2021-04-03 (×2): 10 mg via ORAL
  Filled 2021-03-30 (×15): qty 2

## 2021-03-30 MED ORDER — 0.9 % SODIUM CHLORIDE (POUR BTL) OPTIME
TOPICAL | Status: DC | PRN
  Administered 2021-03-30: 1000 mL

## 2021-03-30 MED ORDER — MIDAZOLAM HCL 2 MG/2ML IJ SOLN
INTRAMUSCULAR | Status: AC
  Filled 2021-03-30: qty 2

## 2021-03-30 MED ORDER — CEFAZOLIN SODIUM-DEXTROSE 2-4 GM/100ML-% IV SOLN
2.0000 g | Freq: Three times a day (TID) | INTRAVENOUS | Status: AC
  Administered 2021-03-30 – 2021-03-31 (×3): 2 g via INTRAVENOUS
  Filled 2021-03-30 (×4): qty 100

## 2021-03-30 MED ORDER — TACROLIMUS ER 1 MG PO TB24
2.0000 mg | ORAL_TABLET | Freq: Every day | ORAL | Status: DC
  Administered 2021-03-31 – 2021-04-03 (×4): 2 mg via ORAL
  Filled 2021-03-30 (×5): qty 2

## 2021-03-30 MED ORDER — LACTATED RINGERS IV SOLN: INTRAVENOUS | Status: DC | PRN

## 2021-03-30 MED ORDER — BUPIVACAINE HCL (PF) 0.5 % IJ SOLN
INTRAMUSCULAR | Status: AC
  Filled 2021-03-30: qty 30

## 2021-03-30 MED ORDER — INSULIN ASPART 100 UNIT/ML IJ SOLN
INTRAMUSCULAR | Status: DC | PRN
  Administered 2021-03-30: 3 [IU] via SUBCUTANEOUS

## 2021-03-30 MED ORDER — PROPOFOL 10 MG/ML IV BOLUS
INTRAVENOUS | Status: AC
  Filled 2021-03-30: qty 20

## 2021-03-30 MED ORDER — TACROLIMUS ER 4 MG PO TB24
8.0000 mg | ORAL_TABLET | Freq: Every day | ORAL | Status: DC
  Administered 2021-03-31 – 2021-04-03 (×4): 8 mg via ORAL
  Filled 2021-03-30 (×5): qty 2

## 2021-03-30 MED ORDER — LIDOCAINE HCL (PF) 1 % IJ SOLN
INTRAMUSCULAR | Status: AC
  Filled 2021-03-30: qty 30

## 2021-03-30 MED ORDER — INSULIN ASPART 100 UNIT/ML IJ SOLN
3.0000 [IU] | INTRAMUSCULAR | Status: DC
  Filled 2021-03-30: qty 0.03

## 2021-03-30 MED ORDER — VANCOMYCIN HCL 1000 MG IV SOLR
INTRAVENOUS | Status: DC | PRN
  Administered 2021-03-30: 1000 mg via TOPICAL

## 2021-03-30 MED ORDER — ROCURONIUM BROMIDE 10 MG/ML (PF) SYRINGE
PREFILLED_SYRINGE | INTRAVENOUS | Status: DC | PRN
  Administered 2021-03-30: 40 mg via INTRAVENOUS

## 2021-03-30 MED ORDER — POTASSIUM CHLORIDE 10 MEQ/100ML IV SOLN
10.0000 meq | INTRAVENOUS | Status: AC
  Administered 2021-03-30 (×3): 10 meq via INTRAVENOUS
  Filled 2021-03-30 (×3): qty 100

## 2021-03-30 MED ORDER — FENTANYL CITRATE (PF) 100 MCG/2ML IJ SOLN
25.0000 ug | INTRAMUSCULAR | Status: DC | PRN
  Administered 2021-03-30 (×2): 25 ug via INTRAVENOUS
  Administered 2021-03-30 (×2): 50 ug via INTRAVENOUS

## 2021-03-30 MED ORDER — HYDROMORPHONE HCL 1 MG/ML IJ SOLN
0.5000 mg | INTRAMUSCULAR | Status: DC | PRN
  Administered 2021-03-30: 0.5 mg via INTRAVENOUS
  Administered 2021-03-30 (×3): 1 mg via INTRAVENOUS
  Administered 2021-03-31: 0.5 mg via INTRAVENOUS
  Administered 2021-03-31 – 2021-04-02 (×8): 1 mg via INTRAVENOUS
  Filled 2021-03-30 (×9): qty 1
  Filled 2021-03-30: qty 0.5
  Filled 2021-03-30 (×2): qty 1

## 2021-03-30 MED ORDER — FENTANYL CITRATE (PF) 250 MCG/5ML IJ SOLN
INTRAMUSCULAR | Status: DC | PRN
  Administered 2021-03-30 (×5): 50 ug via INTRAVENOUS

## 2021-03-30 MED ORDER — METHOCARBAMOL 500 MG PO TABS
500.0000 mg | ORAL_TABLET | Freq: Four times a day (QID) | ORAL | Status: DC | PRN
  Administered 2021-03-30 – 2021-04-03 (×13): 500 mg via ORAL
  Filled 2021-03-30 (×13): qty 1

## 2021-03-30 MED ORDER — DOCUSATE SODIUM 100 MG PO CAPS
100.0000 mg | ORAL_CAPSULE | Freq: Two times a day (BID) | ORAL | Status: DC
  Administered 2021-03-30 – 2021-04-03 (×7): 100 mg via ORAL
  Filled 2021-03-30 (×8): qty 1

## 2021-03-30 MED ORDER — ONDANSETRON HCL 4 MG/2ML IJ SOLN
INTRAMUSCULAR | Status: DC | PRN
  Administered 2021-03-30: 4 mg via INTRAVENOUS

## 2021-03-30 MED ORDER — CEFAZOLIN SODIUM-DEXTROSE 2-4 GM/100ML-% IV SOLN: 2.0000 g | INTRAVENOUS | Status: DC

## 2021-03-30 MED ORDER — BUPIVACAINE HCL (PF) 0.5 % IJ SOLN
INTRAMUSCULAR | Status: DC | PRN
  Administered 2021-03-30: 20 mL

## 2021-03-30 MED ORDER — ISOPROTERENOL HCL 0.2 MG/ML IJ SOLN
2.0000 ug/min | INTRAVENOUS | Status: AC
  Filled 2021-03-30 (×2): qty 5

## 2021-03-30 MED ORDER — CEFAZOLIN SODIUM-DEXTROSE 2-3 GM-%(50ML) IV SOLR
INTRAVENOUS | Status: DC | PRN
  Administered 2021-03-30: 2 g via INTRAVENOUS

## 2021-03-30 MED ORDER — ACETAMINOPHEN 500 MG PO TABS: 1000.0000 mg | ORAL_TABLET | Freq: Once | ORAL | Status: DC

## 2021-03-30 MED ORDER — TACROLIMUS ER 4 MG PO TB24: 10.0000 mg | ORAL_TABLET | Freq: Every day | ORAL | Status: DC

## 2021-03-30 MED ORDER — POLYETHYLENE GLYCOL 3350 17 G PO PACK: 17.0000 g | PACK | Freq: Every day | ORAL | Status: DC | PRN

## 2021-03-30 MED ORDER — LIDOCAINE 2% (20 MG/ML) 5 ML SYRINGE
INTRAMUSCULAR | Status: DC | PRN
  Administered 2021-03-30: 60 mg via INTRAVENOUS

## 2021-03-30 MED ORDER — HYDRALAZINE HCL 20 MG/ML IJ SOLN
10.0000 mg | Freq: Three times a day (TID) | INTRAMUSCULAR | Status: DC | PRN
  Filled 2021-03-30: qty 1

## 2021-03-30 MED ORDER — OXYCODONE HCL 5 MG/5ML PO SOLN: 5.0000 mg | Freq: Once | ORAL | Status: DC | PRN

## 2021-03-30 MED ORDER — FENTANYL CITRATE (PF) 250 MCG/5ML IJ SOLN
INTRAMUSCULAR | Status: AC
  Filled 2021-03-30: qty 5

## 2021-03-30 MED ORDER — METHOCARBAMOL 1000 MG/10ML IJ SOLN
500.0000 mg | Freq: Four times a day (QID) | INTRAVENOUS | Status: DC | PRN
  Filled 2021-03-30: qty 5

## 2021-03-30 SURGICAL SUPPLY — 73 items
BANDAGE ESMARK 6X9 LF (GAUZE/BANDAGES/DRESSINGS) ×1 IMPLANT
BIT DRILL 2.5 X LONG (BIT) ×1
BIT DRILL CALIBR QC 2.8X250 (BIT) ×1 IMPLANT
BIT DRILL X LONG 2.5 (BIT) IMPLANT
BLADE CLIPPER SURG (BLADE) IMPLANT
BLADE SURG 15 STRL LF DISP TIS (BLADE) ×1 IMPLANT
BLADE SURG 15 STRL SS (BLADE) ×1
BNDG ELASTIC 4X5.8 VLCR STR LF (GAUZE/BANDAGES/DRESSINGS) ×2 IMPLANT
BNDG ELASTIC 6X5.8 VLCR STR LF (GAUZE/BANDAGES/DRESSINGS) ×2 IMPLANT
BNDG ESMARK 6X9 LF (GAUZE/BANDAGES/DRESSINGS) ×2
BNDG GAUZE ELAST 4 BULKY (GAUZE/BANDAGES/DRESSINGS) ×2 IMPLANT
BRUSH SCRUB EZ PLAIN DRY (MISCELLANEOUS) ×4 IMPLANT
CANISTER SUCT 3000ML PPV (MISCELLANEOUS) ×2 IMPLANT
CHLORAPREP W/TINT 26 (MISCELLANEOUS) ×4 IMPLANT
CLSR STERI-STRIP ANTIMIC 1/2X4 (GAUZE/BANDAGES/DRESSINGS) ×1 IMPLANT
COVER SURGICAL LIGHT HANDLE (MISCELLANEOUS) ×2 IMPLANT
COVER WAND RF STERILE (DRAPES) ×2 IMPLANT
CUFF TOURN SGL QUICK 34 (TOURNIQUET CUFF) ×1
CUFF TRNQT CYL 34X4.125X (TOURNIQUET CUFF) ×1 IMPLANT
DRAPE C-ARM 42X72 X-RAY (DRAPES) ×2 IMPLANT
DRAPE C-ARMOR (DRAPES) ×2 IMPLANT
DRAPE ORTHO SPLIT 77X108 STRL (DRAPES) ×2
DRAPE SURG ORHT 6 SPLT 77X108 (DRAPES) ×2 IMPLANT
DRAPE U-SHAPE 47X51 STRL (DRAPES) ×2 IMPLANT
DRILL BIT X LONG 2.5 (BIT) ×1
DRSG PAD ABDOMINAL 8X10 ST (GAUZE/BANDAGES/DRESSINGS) ×4 IMPLANT
ELECT REM PT RETURN 9FT ADLT (ELECTROSURGICAL) ×2
ELECTRODE REM PT RTRN 9FT ADLT (ELECTROSURGICAL) ×1 IMPLANT
GAUZE SPONGE 4X4 12PLY STRL (GAUZE/BANDAGES/DRESSINGS) ×2 IMPLANT
GAUZE SPONGE 4X4 16PLY XRAY LF (GAUZE/BANDAGES/DRESSINGS) ×1 IMPLANT
GLOVE BIO SURGEON STRL SZ 6.5 (GLOVE) ×6 IMPLANT
GLOVE BIO SURGEON STRL SZ7.5 (GLOVE) ×8 IMPLANT
GLOVE BIOGEL PI IND STRL 7.5 (GLOVE) ×1 IMPLANT
GLOVE BIOGEL PI INDICATOR 7.5 (GLOVE) ×1
GLOVE SURG UNDER POLY LF SZ6.5 (GLOVE) ×2 IMPLANT
GOWN STRL REUS W/ TWL LRG LVL3 (GOWN DISPOSABLE) ×2 IMPLANT
GOWN STRL REUS W/TWL LRG LVL3 (GOWN DISPOSABLE) ×2
IMMOBILIZER KNEE 22 UNIV (SOFTGOODS) ×2 IMPLANT
KIT BASIN OR (CUSTOM PROCEDURE TRAY) ×2 IMPLANT
KIT TURNOVER KIT B (KITS) ×2 IMPLANT
NDL SUT 6 .5 CRC .975X.05 MAYO (NEEDLE) ×1 IMPLANT
NEEDLE MAYO TAPER (NEEDLE) ×1
NS IRRIG 1000ML POUR BTL (IV SOLUTION) ×2 IMPLANT
PACK TOTAL JOINT (CUSTOM PROCEDURE TRAY) ×2 IMPLANT
PAD ARMBOARD 7.5X6 YLW CONV (MISCELLANEOUS) ×4 IMPLANT
PAD CAST 4YDX4 CTTN HI CHSV (CAST SUPPLIES) ×1 IMPLANT
PADDING CAST COTTON 4X4 STRL (CAST SUPPLIES) ×1
PADDING CAST COTTON 6X4 STRL (CAST SUPPLIES) ×2 IMPLANT
PLATE TIBIA VA-LCP 6H RT (Plate) ×1 IMPLANT
SCREW CORT HEADED ST 3.5X32 (Screw) ×2 IMPLANT
SCREW HEADED ST 3.5X36 (Screw) ×1 IMPLANT
SCREW HEADED ST 3.5X70 (Screw) ×1 IMPLANT
SCREW LOCKING 3.5X70MM VA (Screw) ×1 IMPLANT
SCREW LOCKING VA 3.5X75MM (Screw) ×1 IMPLANT
SCREW VA-LOCKING 65MM 3.5 (Screw) ×1 IMPLANT
STAPLER VISISTAT 35W (STAPLE) ×2 IMPLANT
SUCTION FRAZIER HANDLE 10FR (MISCELLANEOUS) ×1
SUCTION TUBE FRAZIER 10FR DISP (MISCELLANEOUS) ×1 IMPLANT
SUT ETHILON 2 0 FS 18 (SUTURE) ×2 IMPLANT
SUT ETHILON 3 0 PS 1 (SUTURE) IMPLANT
SUT FIBERWIRE #2 38 T-5 BLUE (SUTURE)
SUT VIC AB 0 CT1 27 (SUTURE)
SUT VIC AB 0 CT1 27XBRD ANBCTR (SUTURE) IMPLANT
SUT VIC AB 1 CT1 18XCR BRD 8 (SUTURE) IMPLANT
SUT VIC AB 1 CT1 27 (SUTURE) ×1
SUT VIC AB 1 CT1 27XBRD ANBCTR (SUTURE) ×1 IMPLANT
SUT VIC AB 1 CT1 8-18 (SUTURE)
SUT VIC AB 2-0 CT1 27 (SUTURE) ×2
SUT VIC AB 2-0 CT1 TAPERPNT 27 (SUTURE) ×2 IMPLANT
SUTURE FIBERWR #2 38 T-5 BLUE (SUTURE) IMPLANT
TOWEL GREEN STERILE (TOWEL DISPOSABLE) ×4 IMPLANT
TRAY FOLEY MTR SLVR 16FR STAT (SET/KITS/TRAYS/PACK) IMPLANT
WATER STERILE IRR 1000ML POUR (IV SOLUTION) ×4 IMPLANT

## 2021-03-30 NOTE — Progress Notes (Signed)
Inpatient Diabetes Program Recommendations  AACE/ADA: New Consensus Statement on Inpatient Glycemic Control (2015)  Target Ranges:  Prepandial:   less than 140 mg/dL      Peak postprandial:   less than 180 mg/dL (1-2 hours)      Critically ill patients:  140 - 180 mg/dL   Lab Results  Component Value Date   GLUCAP 250 (H) 03/30/2021   HGBA1C 10.6 (H) 03/29/2021    Review of Glycemic Control Results for Manuel Baker, Manuel Baker (MRN [id number]) as of 03/30/2021 15:14  Ref. Range 03/29/2021 20:53 03/30/2021 06:55 03/30/2021 09:36 03/30/2021 10:36 03/30/2021 12:29  Glucose-Capillary Latest Ref Range: 70 - 99 mg/dL 702 (H) 637 (H) 858 (H) 238 (H) 250 (H)   Diabetes history:  DM1 Outpatient Diabetes medications:  Tresiba 14 units QHS Apidra 0-45 units TID  Current orders for Inpatient glycemic control:  Lantus 14 units Daily  Novolog 0-9 units TID & 0-5 units QHS Novolog 3 units TID with meals  Spoke with patient at bedside.  He is a bit sleepy and in some pain s/p surgery today.  This DM coordinator has spoke with Manuel Baker in the past.  He states he has been taking his insulins as prescribed.  He wears a Dexcom.  He is from [facility] and has been in [place] living with roommates.  He states he will be going back to [place] once he is discharged to stay with his grandmother for recovery.  Reviewed patient's current A1c of 10.65. Explained what a A1c is and what it measures. Also reviewed goal A1c with patient, importance of good glucose control @ home, and blood sugar goals.  CBG's elevated this afternoon as insulins were held for surgery this morning.  Agree with dosing and expect CBG's to come down as insulins have resumed and basal has been administered.    Will continue to follow while inpatient.  Thank you, Dulce Sellar, RN, BSN Diabetes Coordinator Inpatient Diabetes Program [phone] (team pager from 8a-5p)

## 2021-03-30 NOTE — Progress Notes (Signed)
PROGRESS NOTE    Manuel Baker  FYB:[id number] DOB: [birth year] DOA: 03/28/2021 PCP: Demaris Callander [facility]   Brief Narrative: 25 year old with past medical history significant for type I diabetic, gastroparesis, Hx Epstein anomaly S/P heart transplant at [facility], hypertension, GERD, chronic anxiety depression, THC use and cannabis hyperemesis syndrome, seizure disorder, history of CVA who presents to [facility], ED via EMS due to nausea vomiting polyuria abdominal pain 1 day duration.  Patient has been having pain in his right knee after a fall 6-day prior to admission.  He supposed to have surgery with Dr. Jena Gauss on 5/5.  The morning of admission he wake up with nausea vomiting.  Evaluation in the ED patient was found to be in DKA bicarb 19, anion gap 18, pH 7.5, mild transaminases    Assessment & Plan:   Principal Problem:   Closed fracture of lateral portion of right tibial plateau Active Problems:   DKA, type 1 (HCC)   Closed fracture of right tibial plateau  1-DKA type I: Patient was started on IV fluids insulin Gtt.  Gap close, he was transition to lantus , meal coverage and SSI./  Chest x ray negative.  Continue with lantus, meal coverage and SSI.   2-Right Knee , closed fracture of lateral portion of right tibia plateau.  Open reduction and internal fixation of right tibial plateau fracture by Dr Jena Gauss 5/06 Cardiology has clear patient for surgery.   3-Right LE pain.  Doppler pending  4-Pseudohyponatremia; hyperglycemia corrected.  Resolved.   5-AKI: Likely related to DKA: Continue baseline 1.2 Presents with a creatinine of 2.2 Improved with IV fluids Cr down to 1.2  6-Anemia of chronic disease in the setting of heart transplant: Monitor hemoglobin Iron low, started supplement.   7-History of heart transplant: Heart failure Cardiology  following Continue with prednisone and tacrolimus.   8-Seizure disorder: Resume Vimpat  9-Hypertension: Continue  with Norvasc, Cardura Will add PRN hydralazine.   GERD; Continue with PPI.  THC use disorder/history of cannabis hyperemesis syndrome Counseled on THC cessation at bedside.  Oral thrush: Started  Nystatin Hypokalemia; replete IV.     Estimated body mass index is 17.96 kg/m as calculated from the following:   Height as of 02/20/21: 5\' 7"  (1.702 m).   Weight as of this encounter: 52 kg.   DVT prophylaxis: Lovenox Code Status: Full code Family Communication: Discussed with patient Disposition Plan:  Status is: Inpatient  Remains inpatient appropriate because:IV treatments appropriate due to intensity of illness or inability to take PO   Dispo:  Patient From: Home  Planned Disposition: await PT evaluation.   Medically stable for discharge: No          Consultants:   Cardiology  Ortho  Procedures:     Antimicrobials:    Subjective: He is having knee pain. Just came from sx.  Denies dyspnea.   Objective: Vitals:   03/30/21 1005 03/30/21 1020 03/30/21 1034 03/30/21 1230  BP: (!) 169/84 (!) 175/90 (!) 184/93 (!) 179/91  Pulse: (!) 113 (!) 113 (!) 118 (!) 114  Resp: 15 18 19 18   Temp:  (!) 97.2 F (36.2 C) 97.8 F (36.6 C) 97.7 F (36.5 C)  TempSrc:   Axillary Axillary  SpO2: 100% 100% 100% 100%  Weight:        Intake/Output Summary (Last 24 hours) at 03/30/2021 1507 Last data filed at 03/30/2021 0934 Gross per 24 hour  Intake 1892.16 ml  Output 100 ml  Net 1792.16 ml  Filed Weights   03/30/21 0500  Weight: 52 kg    Examination:  General exam: NAD Respiratory system: CTA Cardiovascular system: S 1, S 2 RRR Gastrointestinal system: BS present, soft, nt Central nervous system: Alert Extremities: right knee with dressing.   Data Reviewed: I have personally reviewed following labs and imaging studies  CBC: Recent Labs  Lab 03/28/21 1636 03/28/21 1920 03/29/21 0303 03/29/21 0640 03/30/21 0206  WBC 6.7  --  6.7  --  8.9  HGB 9.3*  10.2* 7.7* 8.5* 8.0*  HCT 27.9* 30.0* 23.0* 25.6* 24.3*  MCV 69.4*  --  69.1*  --  70.0*  PLT 460*  --  435*  --  457*   Basic Metabolic Panel: Recent Labs  Lab 03/28/21 1636 03/28/21 1920 03/28/21 2138 03/29/21 0303 03/30/21 0206  NA 124* 127* 135 137 137  K 4.3 4.2 4.0 3.6 3.2*  CL 87*  --  101 100 103  CO2 19*  --  25 27 26   GLUCOSE 1,185*  --  545* 156* 136*  BUN 25*  --  20 15 7   CREATININE 2.24*  --  2.03* 1.47* 1.23  CALCIUM 9.6  --  8.8* 9.3 8.8*  MG  --   --   --  2.1  --   PHOS  --   --   --  3.4  --    GFR: Estimated Creatinine Clearance: 68.7 mL/min (by C-G formula based on SCr of 1.23 mg/dL). Liver Function Tests: Recent Labs  Lab 03/28/21 1636  AST 53*  ALT 55*  ALKPHOS 141*  BILITOT 1.1  PROT 7.8  ALBUMIN 3.8   Recent Labs  Lab 03/28/21 1636  LIPASE 31   No results for input(s): AMMONIA in the last 168 hours. Coagulation Profile: No results for input(s): INR, PROTIME in the last 168 hours. Cardiac Enzymes: No results for input(s): CKTOTAL, CKMB, CKMBINDEX, TROPONINI in the last 168 hours. BNP (last 3 results) No results for input(s): PROBNP in the last 8760 hours. HbA1C: Recent Labs    03/29/21 0640  HGBA1C 10.6*   CBG: Recent Labs  Lab 03/29/21 2053 03/30/21 0655 03/30/21 0936 03/30/21 1036 03/30/21 1229  GLUCAP 161* 176* 257* 238* 250*   Lipid Profile: No results for input(s): CHOL, HDL, LDLCALC, TRIG, CHOLHDL, LDLDIRECT in the last 72 hours. Thyroid Function Tests: No results for input(s): TSH, T4TOTAL, FREET4, T3FREE, THYROIDAB in the last 72 hours. Anemia Panel: Recent Labs    03/28/21 2229  VITAMINB12 618  FOLATE 12.5  FERRITIN 81  TIBC 357  IRON 36*   Sepsis Labs: No results for input(s): PROCALCITON, LATICACIDVEN in the last 168 hours.  Recent Results (from the past 240 hour(s))  SARS CORONAVIRUS 2 (TAT 6-24 HRS) Nasopharyngeal Nasopharyngeal Swab     Status: None   Collection Time: 03/28/21 12:10 PM    Specimen: Nasopharyngeal Swab  Result Value Ref Range Status   SARS Coronavirus 2 NEGATIVE NEGATIVE Final    Comment: (NOTE) SARS-CoV-2 target nucleic acids are NOT DETECTED.  The SARS-CoV-2 RNA is generally detectable in upper and lower respiratory specimens during the acute phase of infection. Negative results do not preclude SARS-CoV-2 infection, do not rule out co-infections with other pathogens, and should not be used as the sole basis for treatment or other patient management decisions. Negative results must be combined with clinical observations, patient history, and epidemiological information. The expected result is Negative.  Fact Sheet for Patients: HairSlick.nohttps://www.fda.gov/media/138098/download  Fact Sheet for Healthcare Providers: quierodirigir.comhttps://www.fda.gov/media/138095/download  This test is not yet approved or cleared by the [place] and  has been authorized for detection and/or diagnosis of SARS-CoV-2 by FDA under an Emergency Use Authorization (EUA). This EUA will remain  in effect (meaning this test can be used) for the duration of the COVID-19 declaration under Se ction 564(b)(1) of the Act, 21 U.S.C. section 360bbb-3(b)(1), unless the authorization is terminated or revoked sooner.  Performed at [facility], [address] [place], [place] [address]   MRSA PCR Screening     Status: None   Collection Time: 03/29/21  3:54 PM   Specimen: Nasal Mucosa; Nasopharyngeal  Result Value Ref Range Status   MRSA by PCR NEGATIVE NEGATIVE Final    Comment:        The GeneXpert MRSA Assay (FDA approved for NASAL specimens only), is one component of a comprehensive MRSA colonization surveillance program. It is not intended to diagnose MRSA infection nor to guide or monitor treatment for MRSA infections. Performed at [facility], [address] [place], [place] [address]          Radiology Studies: DG Chest 2 View  Result Date:  03/29/2021 CLINICAL DATA:  Nausea/vomiting EXAM: CHEST - 2 VIEW COMPARISON:  Radiograph 02/19/2021, chest CT 12/02/2020 FINDINGS: Unchanged cardiomediastinal silhouette. There is no new focal airspace disease. There is no pleural effusion or pneumothorax. There is no acute osseous abnormality. IMPRESSION: No evidence of acute cardiopulmonary disease. Electronically Signed   By: Caprice Renshaw   On: 03/29/2021 09:42   DG Knee Complete 4 Views Right  Result Date: 03/30/2021 CLINICAL DATA:  ORIF, tibial plateau fracture EXAM: RIGHT KNEE - COMPLETE 4+ VIEW; DG C-ARM 1-60 MIN COMPARISON:  03/30/2021 FLUOROSCOPY TIME:  1:11 FINDINGS: Intraoperative fluoroscopic images and postoperative radiographs demonstrate plate and screw fixation of the lateral tibial plateau. Persistent lipohemarthrosis. IMPRESSION: ORIF of the lateral tibial plateau. Electronically Signed   By: Lauralyn Primes M.D.   On: 03/30/2021 12:00   DG C-Arm 1-60 Min  Result Date: 03/30/2021 CLINICAL DATA:  ORIF, tibial plateau fracture EXAM: RIGHT KNEE - COMPLETE 4+ VIEW; DG C-ARM 1-60 MIN COMPARISON:  03/30/2021 FLUOROSCOPY TIME:  1:11 FINDINGS: Intraoperative fluoroscopic images and postoperative radiographs demonstrate plate and screw fixation of the lateral tibial plateau. Persistent lipohemarthrosis. IMPRESSION: ORIF of the lateral tibial plateau. Electronically Signed   By: Lauralyn Primes M.D.   On: 03/30/2021 12:00        Scheduled Meds: . amLODipine  10 mg Oral Daily  . docusate sodium  100 mg Oral BID  . doxazosin  1 mg Oral QHS  . [START ON 03/31/2021] enoxaparin (LOVENOX) injection  40 mg Subcutaneous Daily  . ferrous sulfate  325 mg Oral Q breakfast  . insulin aspart  0-5 Units Subcutaneous QHS  . insulin aspart  0-9 Units Subcutaneous TID WC  . insulin aspart  3 Units Subcutaneous TID WC  . insulin glargine  14 Units Subcutaneous Daily  . lacosamide  50 mg Oral BID  . nystatin  5 mL Oral QID  . pantoprazole  40 mg Oral BID  .  pravastatin  40 mg Oral QHS  . predniSONE  2.5 mg Oral Q breakfast  . senna-docusate  2 tablet Oral BID  . sertraline  50 mg Oral Daily  . Tacrolimus ER  8 mg Oral QAC breakfast  . valGANciclovir  900 mg Oral QHS   Continuous Infusions: . sodium chloride 75 mL/hr at 03/30/21 1424  .  ceFAZolin (ANCEF) IV    . isoproterenol (ISUPREL) infusion Stopped (03/30/21 1236)  . methocarbamol (ROBAXIN) IV       LOS: 2 days    Time spent: 35 minutes    Jeoffrey Eleazer A Tambria Pfannenstiel, MD [facility]   If 7PM-7AM, please contact night-coverage [email or web address]  03/30/2021, 3:07 PM

## 2021-03-30 NOTE — Anesthesia Postprocedure Evaluation (Signed)
Anesthesia Post Note  Patient: Manuel Baker  Procedure(s) Performed: OPEN REDUCTION INTERNAL FIXATION (ORIF) TIBIAL PLATEAU (Right )     Patient location during evaluation: PACU Anesthesia Type: General Level of consciousness: awake and alert and oriented Pain management: pain level controlled Vital Signs Assessment: post-procedure vital signs reviewed and stable Respiratory status: spontaneous breathing, nonlabored ventilation and respiratory function stable Cardiovascular status: blood pressure returned to baseline Postop Assessment: no apparent nausea or vomiting Anesthetic complications: no   No complications documented.  Last Vitals:  Vitals:   03/30/21 1034 03/30/21 1230  BP: (!) 184/93 (!) 179/91  Pulse: (!) 118 (!) 114  Resp: 19 18  Temp: 36.6 C 36.5 C  SpO2: 100% 100%    Last Pain:  Vitals:   03/30/21 1230  TempSrc: Axillary  PainSc:                  Kaylyn Layer

## 2021-03-30 NOTE — Anesthesia Procedure Notes (Signed)
Procedure Name: Intubation Date/Time: 03/30/2021 8:15 AM Performed by: Brennan Bailey, MD Pre-anesthesia Checklist: Patient identified, Emergency Drugs available, Suction available and Patient being monitored Patient Re-evaluated:Patient Re-evaluated prior to induction Oxygen Delivery Method: Circle System Utilized Preoxygenation: Pre-oxygenation with 100% oxygen Induction Type: IV induction Ventilation: Mask ventilation without difficulty Laryngoscope Size: Mac and 4 Grade View: Grade II Tube type: Oral Tube size: 7.0 mm Number of attempts: 2 Airway Equipment and Method: Stylet and Oral airway Placement Confirmation: ETT inserted through vocal cords under direct vision,  positive ETCO2 and breath sounds checked- equal and bilateral Secured at: 22 cm Tube secured with: Tape Dental Injury: Teeth and Oropharynx as per pre-operative assessment  Comments: First intubation by CRNA, esophageal intubation, immediately recognized and ETT removed. Second attempt by myself with Mac 4 blade, grade 2 view, somewhat anterior larynx, atraumatic intubation. VSS. SpO2 >95% throughout attempts. Daiva Huge, MD

## 2021-03-30 NOTE — Op Note (Signed)
Orthopaedic Surgery Operative Note (CSN: [id number] ) Date of Surgery: 03/30/2021  Admit Date: 03/28/2021   Diagnoses: Pre-Op Diagnoses: Right bicondylar tibial plateau fracture  Post-Op Diagnosis: Same  Procedures: CPT 27536-Open reduction internal fixation of right tibial plateau fracture   Surgeons : Primary: Roby Lofts, MD  Assistant: Ulyses Southward, PA-C  Location: OR 3   Anesthesia:General  Antibiotics: Ancef 2g preop with 1 gm vancomycin powder placed topically   Tourniquet time:None  Estimated Blood Loss:50 mL  Complications:None  Specimens:None  Implants: Implant Name Type Inv. Item Serial No. Manufacturer Lot No. LRB No. Used Action  SCREW HEADED ST 3.5X70 - [id number] Screw SCREW HEADED ST 3.5X70  [employer]  Right 1 Implanted  SCREW CORT HEADED ST 3.5X32 - [id number] Screw SCREW CORT HEADED ST 3.5X32  [employer]  Right 2 Implanted  PLATE TIBIA VA-LCP 6H RT - [id number] Plate PLATE TIBIA VA-LCP 6H RT  [employer]  Right 1 Implanted  SCREW HEADED ST 3.5X36 - [id number] Screw SCREW HEADED ST 3.5X36  [employer]  Right 1 Implanted  SCREW VA-LOCKING 3.5 - [id number] Screw SCREW VA-LOCKING 3.5  [employer]  Right 1 Implanted  SCREW LOCKING VA 3.5X75MM - [id number] Screw SCREW LOCKING VA 3.5X75MM  [employer]  Right 1 Implanted  SCREW LOCKING 3.5X70MM VA - [id number] Screw SCREW LOCKING 3.5X70MM VA  [employer]  Right 1 Implanted     Indications for Surgery: 25 year old male with a history of type 1 diabetes and history of a heart transplant injured his tibial plateau last week.  Due to the unstable nature of his injury I recommended proceeding with open reduction internal fixation.  Patient was admitted for DKA the night prior to his initial schedule surgery.  He underwent placement of an insulin drip and his sugars have come down appropriately.  I recommended delaying his surgery 1 day and we plan to proceed  today.  Risks and benefits were discussed with the patient.  Risks included but not limited to bleeding, infection, nonunion, malunion, posttraumatic arthritis, knee stiffness, nerve or blood vessel injury, DVT, even the possibility anesthetic complications.  Patient agreed to proceed with surgery and consent was obtained.  Operative Findings: Open reduction internal fixation of bicondylar tibial plateau fracture using Synthes VA proximal tibial locking plate.  Procedure: The patient was identified in the preoperative holding area. Consent was confirmed with the patient and their family and all questions were answered. The operative extremity was marked after confirmation with the patient. he was then brought back to the operating room by our anesthesia colleagues.  He was placed under general anesthetic and carefully transferred over to a radiolucent flat top table.  His right lower extremity was then prepped and draped in usual sterile fashion.  A timeout was performed to verify the patient, the procedure, and the extremity.  Preoperative antibiotics were dosed.  The knee were flexed over a triangle.  Fluoroscopic imaging was obtained that showed the fracture.  A lateral parapatellar incision was then made carried down through skin and subcutaneous tissue.  I incised the retinaculum and developed the interval between the IT band and the capsule.  Proceeded to release the IT band off of the proximal lateral tibial condyle.  I released this all the way back until I could palpate the fibular head.  I did incise through the anterior compartment fascia to visualize the lateral aspect of the proximal tibia.  I performed a Sze meniscal arthrotomy with a 15 blade.  I  tagged the capsule for later repair with #1 Vicryl suture.  The meniscus was intact and the joint was relatively nondisplaced.  I then chose a Synthes 3.5 mm VA LCP proximal tibial locking plate.  I slid this submuscularly along the lateral cortex  of the tibia.  I confirmed placement with AP and lateral fluoroscopic imaging.  I then drilled and placed a 3.5 millimeter screw in the proximal segment to bring the proximal portion of plate flush to bone.  I then made percutaneous incisions to placed 3.5 millimeter screws into the tibial shaft.  3 nonlocking screws were placed.  I then returned to the proximal segment and proceeded to place 3 locking screws across the condyles to hold it in position.  Final fluoroscopic imaging was obtained.  The incision was copiously irrigated.  The capsule to tag stitches were brought through the plate and tied down using a free needle.  A gram of vancomycin powder was placed into the incision.  A layer closure of 0 Vicryl, 2-0 Vicryl and 3-0 Monocryl with Steri-Strips used to close the skin.  Sterile dressing was applied.  Patient was woke from anesthesia and taken to the PACU in stable condition.   Post Op Plan/Instructions: Patient will be nonweightbearing to the right lower extremity.  He will have unrestricted range of motion of the knee.  He will receive Lovenox for DVT prophylaxis.  We will have him mobilize with physical and Occupational Therapy.  I was present and performed the entire surgery.  Ulyses Southward, PA-C did assist me throughout the case. An assistant was necessary given the difficulty in approach, maintenance of reduction and ability to instrument the fracture.   Truitt Merle, MD [facility]

## 2021-03-30 NOTE — Progress Notes (Signed)
   03/30/21 1100  Assess: MEWS Score  Level of Consciousness Alert  O2 Device Room Air  Assess: MEWS Score  MEWS Temp 0  MEWS Systolic 0  MEWS Pulse 2  MEWS RR 0  MEWS LOC 0  MEWS Score 2  MEWS Score Color Yellow  Assess: if the MEWS score is Yellow or Red  Were vital signs taken at a resting state? Yes  Focused Assessment No change from prior assessment  Early Detection of Sepsis Score *See Row Information* Low  Treat  MEWS Interventions Administered prn meds/treatments  Pain Scale 0-10  Pain Score 8  Pain Type Surgical pain  Pain Location Knee  Pain Orientation Right  Patients Stated Pain Goal 2  Pain Intervention(s) Medication (See eMAR)  Take Vital Signs  Increase Vital Sign Frequency  Yellow: Q 2hr X 2 then Q 4hr X 2, if remains yellow, continue Q 4hrs  Notify: Charge Nurse/RN  Name of Charge Nurse/RN Notified Sarah, RN  Date Charge Nurse/RN Notified 03/30/21  Time Charge Nurse/RN Notified 1100

## 2021-03-30 NOTE — Transfer of Care (Signed)
Immediate Anesthesia Transfer of Care Note  Patient: Manuel Baker  Procedure(s) Performed: OPEN REDUCTION INTERNAL FIXATION (ORIF) TIBIAL PLATEAU (Right )  Patient Location: PACU  Anesthesia Type:General  Level of Consciousness: awake, alert  and oriented  Airway & Oxygen Therapy: Patient Spontanous Breathing  Post-op Assessment: Report given to RN and Post -op Vital signs reviewed and stable  Post vital signs: Reviewed and stable  Last Vitals:  Vitals Value Taken Time  BP 174/86 03/30/21 0936  Temp 36.3 C 03/30/21 0935  Pulse 106 03/30/21 0940  Resp 24 03/30/21 0940  SpO2 99 % 03/30/21 0940  Vitals shown include unvalidated device data.  Last Pain:  Vitals:   03/30/21 0524  TempSrc:   PainSc: 2       Patients Stated Pain Goal: 1 (03/28/21 2330)  Complications: No complications documented.

## 2021-03-30 NOTE — H&P (Signed)
Orthopaedic Trauma Service (OTS) Consult   Patient ID: Manuel Baker MRN: [id number] DOB/AGE: 24/01/1996 24 y.o.  Reason for Surgery: Right tibial plateau fracture  HPI: Manuel Baker is an 25 y.o. male presents for the open reduction internal fixation of his right tibial plateau fracture.  The patient was injured last Thursday and when he was rolled up on his leg.  He has had inability bear weight.  [facility] Monday at which point a tibial plateau fracture was diagnosed.  He was referred to my office.  Due to the displacement and unstable nature of his injury I recommend proceeding with open reduction internal fixation.  The patient lives in [place] but is from [place].  He has a history of a heart transplant that was performed last year.  He is type I diabetic and he was admitted on Wednesday night for DKA.  This has been under control and he has felt better since yesterday.  He does have a hemoglobin A1c of 10.6.  Past Medical History:  Diagnosis Date  . Congenital heart disease   . Diabetes mellitus without complication (HCC)    type 1  . Heart transplant recipient [facility](HCC) 2021  . Hypertension   . Seizures (HCC)    unsure of last seizure tx vimpat    Past Surgical History:  Procedure Laterality Date  . ASD REPAIR  01/13/2020  . BRONCHOSCOPY  01/20/2020  . colonoscopy    . ESOPHAGEAL BRUSHING  02/19/2021   Procedure: ESOPHAGEAL BRUSHING;  Surgeon: Charna ElizabethMann, Jyothi, MD;  [facility];  Service: Endoscopy;;  . ESOPHAGOGASTRODUODENOSCOPY N/A 02/19/2021   Procedure: ESOPHAGOGASTRODUODENOSCOPY (EGD);  Surgeon: Charna ElizabethMann, Jyothi, MD;  [facility];  Service: Endoscopy;  Laterality: N/A;  . exploration of chest   02/06/20, 01/25/20, 01/21/20   x 3 checking for post-op hemorhage clot infection.  Dr Dareen PianoAnderson  . MEDIASTINOTOMY  01/27/2020   Dr Oleh GeninSchroder  . open heart surgery  03/01/2020   [facility]   . OTHER SURGICAL HISTORY  01/24/2020    Extracopporeal  Dr Dareen PianoANderson  . TEE WITHOUT CARDIOVERSION N/A 01/06/2018   Procedure: TRANSESOPHAGEAL ECHOCARDIOGRAM (TEE);  Surgeon: Wendall StadeNishan, Peter C, MD;  [facility];  Service: Cardiovascular;  Laterality: N/A;  . VENTRICULAR ASSIST DEVICE INSERTION  01/26/2020    Family History  Problem Relation Age of Onset  . Diabetes Mellitus I Sister     Social History:  reports that he has never smoked. He has never used smokeless tobacco. He reports current alcohol use. He reports current drug use. Drug: Marijuana.  Allergies:  Allergies  Allergen Reactions  . Ibuprofen     Told not to take ibuprofen after cardiac surgery  . Nsaids Other (See Comments)    Unknown reaction    Medications:  No current facility-administered medications on file prior to encounter.   Current Outpatient Medications on File Prior to Encounter  Medication Sig Dispense Refill  . acetaminophen (TYLENOL) 500 MG tablet Take 500 mg by mouth every 6 (six) hours as needed for mild pain.    Marland Kitchen. amLODipine (NORVASC) 10 MG tablet Take 10 mg by mouth in the morning.    Marland Kitchen. aspirin EC 81 MG tablet Take 81 mg by mouth in the morning. Swallow whole.    . diclofenac Sodium (VOLTAREN) 1 % GEL Apply 2 g topically 4 (four) times daily as needed for muscle pain.    Marland Kitchen. doxazosin (CARDURA) 2 MG tablet Take 1 mg by mouth at bedtime.    .Marland Kitchen  Glucagon (BAQSIMI ONE PACK) 3 MG/DOSE POWD Place 1 spray into the nose as needed (low blood sugar).    . insulin degludec (TRESIBA FLEXTOUCH) 100 UNIT/ML FlexTouch Pen Inject 14 Units into the skin at bedtime.     . insulin glulisine (APIDRA SOLOSTAR) 100 UNIT/ML Solostar Pen Inject 0-45 Units into the skin See admin instructions. Sliding Scale Insulin    . Insulin Syringe-Needle U-100 (INSULIN SYRINGE .3CC/31GX5/16") 31G X 5/16" 0.3 ML MISC 1 Act by Does not apply route 4 (four) times daily -  before meals and at bedtime. 300 each 0  . metoCLOPramide (REGLAN) 5 MG tablet Take 5 mg by mouth 3  (three) times daily with meals.    . ondansetron (ZOFRAN-ODT) 4 MG disintegrating tablet Take 4 mg by mouth every 8 (eight) hours as needed for nausea/vomiting.    . pantoprazole (PROTONIX) 40 MG tablet Take 40 mg by mouth in the morning and at bedtime.    . polyethylene glycol powder (GLYCOLAX/MIRALAX) 17 GM/SCOOP powder Take 17 g by mouth daily as needed for constipation.    . pravastatin (PRAVACHOL) 40 MG tablet Take 40 mg by mouth at bedtime.    . predniSONE (DELTASONE) 5 MG tablet Take 2.5 mg by mouth in the morning.    . sennosides-docusate sodium (SENOKOT-S) 8.6-50 MG tablet Take 2 tablets by mouth daily as needed for constipation.    . sertraline (ZOLOFT) 50 MG tablet Take 50 mg by mouth in the morning.    . Tacrolimus ER 4 MG TB24 Take 8 mg by mouth daily before breakfast.    . traZODone (DESYREL) 50 MG tablet Take 25 mg by mouth at bedtime as needed for sleep.    Marland Kitchen VIMPAT 50 MG TABS tablet Take 50 mg by mouth 2 (two) times daily.    Marland Kitchen anidulafungin in sodium chloride 0.9 % 50 mL 50mg  IV Q24 hours    . metoCLOPramide (REGLAN) 5 MG/ML injection Inject 1 mL (5 mg total) into the vein 3 (three) times daily before meals. 2 mL 0  . pantoprazole (PROTONIX) 40 MG injection Inject 40 mg into the vein every 12 (twelve) hours. 1 each   . sodium chloride 0.9 % SOLN 50 mL with promethazine 25 MG/ML SOLN 12.5 mg Inject 12.5 mg into the vein every 6 (six) hours as needed (N/VOmiting).      ROS: Constitutional: No fever or chills Vision: No changes in vision ENT: No difficulty swallowing CV: No chest pain Pulm: No SOB or wheezing GI: No nausea or vomiting GU: No urgency or inability to hold urine Skin: No poor wound healing Neurologic: No numbness or tingling Psychiatric: No depression or anxiety Heme: No bruising Allergic: No reaction to medications or food   Exam: Blood pressure 128/67, pulse 89, temperature 98.2 F (36.8 C), temperature source Oral, resp. rate 16, weight 52 kg, SpO2 98  %. General: No acute distress Orientation: Awake alert and oriented x3 Mood and Affect: Cooperative and pleasant Gait: Unable to assess due to his fracture Coordination and balance: Within normal limits  Right lower extremity reveals a moderate swelling to the right knee.  Significant pain with attempted range of motion of the knee.  Compartments are soft and compressible.  He has intact eversion and inversion however he has no active dorsiflexion or great toe extension.  He does have intact plantar flexion.  He has diminished sensation in his peroneal nerve distribution but intact sensation to his tibial nerve distribution.  Left lower extremity: Skin without  lesions. No tenderness to palpation. Full painless ROM, full strength in each muscle groups without evidence of instability.   Medical Decision Making: Data: Imaging: X-rays of the right knee and CT scan shows a bicondylar tibial plateau fracture with intra-articular extension.  Fortunately the articular joint is relatively well reduced without any signs of any depression.  Labs:  Results for orders placed or performed during the hospital encounter of 03/28/21 (from the past 24 hour(s))  CBG monitoring, ED     Status: Abnormal   Collection Time: 03/29/21  8:05 AM  Result Value Ref Range   Glucose-Capillary 200 (H) 70 - 99 mg/dL  CBG monitoring, ED     Status: Abnormal   Collection Time: 03/29/21 12:13 PM  Result Value Ref Range   Glucose-Capillary 105 (H) 70 - 99 mg/dL  Glucose, capillary     Status: None   Collection Time: 03/29/21  3:03 PM  Result Value Ref Range   Glucose-Capillary 96 70 - 99 mg/dL  MRSA PCR Screening     Status: None   Collection Time: 03/29/21  3:54 PM   Specimen: Nasal Mucosa; Nasopharyngeal  Result Value Ref Range   MRSA by PCR NEGATIVE NEGATIVE  Glucose, capillary     Status: Abnormal   Collection Time: 03/29/21  5:12 PM  Result Value Ref Range   Glucose-Capillary 113 (H) 70 - 99 mg/dL  Glucose,  capillary     Status: Abnormal   Collection Time: 03/29/21  8:53 PM  Result Value Ref Range   Glucose-Capillary 161 (H) 70 - 99 mg/dL  CBC     Status: Abnormal   Collection Time: 03/30/21  2:06 AM  Result Value Ref Range   WBC 8.9 4.0 - 10.5 K/uL   RBC 3.47 (L) 4.22 - 5.81 MIL/uL   Hemoglobin 8.0 (L) 13.0 - 17.0 g/dL   HCT 81.8 (L) 29.9 - 37.1 %   MCV 70.0 (L) 80.0 - 100.0 fL   MCH 23.1 (L) 26.0 - 34.0 pg   MCHC 32.9 30.0 - 36.0 g/dL   RDW 69.6 (H) 78.9 - 38.1 %   Platelets 457 (H) 150 - 400 K/uL   nRBC 0.0 0.0 - 0.2 %  Basic metabolic panel     Status: Abnormal   Collection Time: 03/30/21  2:06 AM  Result Value Ref Range   Sodium 137 135 - 145 mmol/L   Potassium 3.2 (L) 3.5 - 5.1 mmol/L   Chloride 103 98 - 111 mmol/L   CO2 26 22 - 32 mmol/L   Glucose, Bld 136 (H) 70 - 99 mg/dL   BUN 7 6 - 20 mg/dL   Creatinine, Ser 0.17 0.61 - 1.24 mg/dL   Calcium 8.8 (L) 8.9 - 10.3 mg/dL   GFR, Estimated >51 >02 mL/min   Anion gap 8 5 - 15  Glucose, capillary     Status: Abnormal   Collection Time: 03/30/21  6:55 AM  Result Value Ref Range   Glucose-Capillary 176 (H) 70 - 99 mg/dL    Imaging or Labs ordered: None  Medical history and chart was reviewed and case discussed with medical provider.  Assessment/Plan: 25 year old male with a history of type 1 diabetes as well as heart transplant with a bicondylar tibial plateau fracture.  Due to the unstable nature of his injury I recommend proceeding with open reduction internal fixation.  Risks and benefits were discussed with the patient including risk of bleeding, infection, malunion, nonunion, hardware failure, hardware irritation, nerve or blood vessel injury,  knee stiffness, knee arthritis, DVT, even the possibility anesthetic complications.  The patient agrees to proceed with surgery.  I did relay to him that being on immunosuppressants and the elevated hemoglobin A1c puts him at even higher risk.  He understands this and wishes to  proceed.  Roby Lofts, MD [facility] [phone] (office) [email or web address]

## 2021-03-31 DIAGNOSIS — E876 Hypokalemia: Secondary | ICD-10-CM

## 2021-03-31 DIAGNOSIS — E1010 Type 1 diabetes mellitus with ketoacidosis without coma: Secondary | ICD-10-CM | POA: Diagnosis not present

## 2021-03-31 DIAGNOSIS — S82121A Displaced fracture of lateral condyle of right tibia, initial encounter for closed fracture: Secondary | ICD-10-CM | POA: Diagnosis not present

## 2021-03-31 LAB — BASIC METABOLIC PANEL
Anion gap: 10 (ref 5–15)
BUN: <5 mg/dL — ABNORMAL LOW (ref 6–20)
CO2: 26 mmol/L (ref 22–32)
Calcium: 9 mg/dL (ref 8.9–10.3)
Chloride: 99 mmol/L (ref 98–111)
Creatinine, Ser: 1.12 mg/dL (ref 0.61–1.24)
GFR, Estimated: >60 mL/min (ref >60)
Glucose, Bld: 88 mg/dL (ref 70–99)
Potassium: 3.1 mmol/L — ABNORMAL LOW (ref 3.5–5.1)
Sodium: 135 mmol/L (ref 135–145)

## 2021-03-31 LAB — CBC
HCT: 25.3 % — ABNORMAL LOW (ref 39.0–52.0)
Hemoglobin: 8.3 g/dL — ABNORMAL LOW (ref 13.0–17.0)
MCH: 23.3 pg — ABNORMAL LOW (ref 26.0–34.0)
MCHC: 32.8 g/dL (ref 30.0–36.0)
MCV: 71.1 fL — ABNORMAL LOW (ref 80.0–100.0)
Platelets: 478 10*3/uL — ABNORMAL HIGH (ref 150–400)
RBC: 3.56 MIL/uL — ABNORMAL LOW (ref 4.22–5.81)
RDW: 16.4 % — ABNORMAL HIGH (ref 11.5–15.5)
WBC: 11.9 10*3/uL — ABNORMAL HIGH (ref 4.0–10.5)
nRBC: 0 % (ref 0.0–0.2)

## 2021-03-31 LAB — GLUCOSE, CAPILLARY
Glucose-Capillary: 193 mg/dL — ABNORMAL HIGH (ref 70–99)
Glucose-Capillary: 128 mg/dL — ABNORMAL HIGH (ref 70–99)
Glucose-Capillary: 57 mg/dL — ABNORMAL LOW (ref 70–99)
Glucose-Capillary: 55 mg/dL — ABNORMAL LOW (ref 70–99)
Glucose-Capillary: 79 mg/dL (ref 70–99)
Glucose-Capillary: 137 mg/dL — ABNORMAL HIGH (ref 70–99)

## 2021-03-31 LAB — VITAMIN D 25 HYDROXY (VIT D DEFICIENCY, FRACTURES): Vit D, 25-Hydroxy: 10.43 ng/mL — ABNORMAL LOW (ref 30–100)

## 2021-03-31 MED ORDER — INSULIN GLARGINE 100 UNIT/ML ~~LOC~~ SOLN
12.0000 [IU] | Freq: Every day | SUBCUTANEOUS | Status: DC
  Administered 2021-04-01 – 2021-04-02 (×2): 12 [IU] via SUBCUTANEOUS
  Filled 2021-03-31 (×2): qty 0.12

## 2021-03-31 MED ORDER — POTASSIUM CHLORIDE CRYS ER 20 MEQ PO TBCR
40.0000 meq | EXTENDED_RELEASE_TABLET | ORAL | Status: AC
  Administered 2021-03-31 (×2): 40 meq via ORAL
  Filled 2021-03-31: qty 2

## 2021-03-31 MED ORDER — SODIUM CHLORIDE 0.9 % IV SOLN: INTRAVENOUS | Status: AC

## 2021-03-31 MED ORDER — VITAMIN D 25 MCG (1000 UNIT) PO TABS
1000.0000 [IU] | ORAL_TABLET | Freq: Every day | ORAL | Status: DC
  Administered 2021-03-31 – 2021-04-03 (×4): 1000 [IU] via ORAL
  Filled 2021-03-31 (×3): qty 1

## 2021-03-31 NOTE — Progress Notes (Signed)
Patient's CBG was 57 when he awoke from a nap.  He has no appetite and has not been eating.  He had a glass of orange juice, glass of apple juice and 1/2 a muffin, and blood sugar came back up.  Expressed to the patient the importance of having regular meals to control his blood glucose, and he verbalized an understanding.

## 2021-03-31 NOTE — Progress Notes (Signed)
Subjective: 1 Day Post-Op Procedure(s) (LRB): OPEN REDUCTION INTERNAL FIXATION (ORIF) TIBIAL PLATEAU (Right) Patient reports pain as moderate and severe.  York Spaniel he was in too much pain to get up with PT earlier today.  Objective: Vital signs in last 24 hours: Temp:  [97.8 F (36.6 C)-99.2 F (37.3 C)] 98.5 F (36.9 C) (05/07 1211) Pulse Rate:  [106-114] 108 (05/07 1211) Resp:  [15-20] 17 (05/07 1211) BP: (157-183)/(84-96) 157/84 (05/07 1211) SpO2:  [100 %] 100 % (05/07 1211)  Intake/Output from previous day: 05/06 0701 - 05/07 0700 In: 1300 [I.V.:1100; IV Piggyback:200] Out: 2000 [Urine:1900; Blood:100] Intake/Output this shift: Total I/O In: -  Out: 800 [Urine:800]  Recent Labs    03/29/21 0303 03/29/21 0640 03/30/21 0206 03/30/21 0853 03/31/21 0012  HGB 7.7* 8.5* 8.0* 11.2* 8.3*   Recent Labs    03/30/21 0206 03/30/21 0853 03/31/21 0012  WBC 8.9  --  11.9*  RBC 3.47*  --  3.56*  HCT 24.3* 33.0* 25.3*  PLT 457*  --  478*   Recent Labs    03/30/21 0206 03/30/21 0853 03/31/21 0012  NA 137 136 135  K 3.2* 3.5 3.1*  CL 103 101 99  CO2 26  --  26  BUN 7 5* <5*  CREATININE 1.23 0.90 1.12  GLUCOSE 136* 213* 88  CALCIUM 8.8*  --  9.0   No results for input(s): LABPT, INR in the last 72 hours.  Neurovascular intact Sensation intact distally Intact pulses distally Dorsiflexion/Plantar flexion intact Incision: dressing C/D/I Compartment soft   Assessment/Plan: 1 Day Post-Op Procedure(s) (LRB): OPEN REDUCTION INTERNAL FIXATION (ORIF) TIBIAL PLATEAU (Right) Up with therapy NWB RLE, ROM knee ok Pain control as ordered, make sure to get something before working with PT lovenox dvt proph      Margart Sickles 03/31/2021, 1:21 PM

## 2021-03-31 NOTE — Evaluation (Addendum)
Occupational Therapy Evaluation Patient Details Name: Manuel Baker MRN: [id number] DOB: 11/23/1996 Today's Date: 03/31/2021    History of Present Illness 24 y.o. male was injured Thursday 4/28, stating he fell twice while climbing stairs hitting his knee both times.  He underwent ORIF 5/5 to his R knee and is NWB.  PMH includes:Congenital heart disease     . Diabetes mellitus without complication (HCC)      type 1  . Heart transplant recipient (HCC) 2021  . Hypertension    . Seizures.   Clinical Impression   Patient admitted for the diagnosis and procedure above.  PTA he was independent with all care and mobility.  He is not currently enrolled in classes due to summer break, but will not have 24 hour assist as needed at his apartment.  Unclear if he can transition back to [place] with family.  Barriers are listed below.  He deferred out of bed today, he was able to sit edge of bed with PT.  OT issued and reviewed pieces of the hip kit, and performed education regarding DME possible needed, and proper usage of RW.  Patient requests afternoon treatments.  Acute OT to follow in order to maximize his functional status.  Home with family, 24 hour assist, and appropriate DME is recommended.      Follow Up Recommendations  No OT follow up    Equipment Recommendations  3 in 1 bedside commode;Tub/shower bench;Wheelchair (18"x18");Wheelchair cushion (18x18x2')    Recommendations for Other Services       Precautions / Restrictions Precautions Precautions: Fall Precaution Comments: R foot drop Required Braces or Orthoses: Knee Immobilizer - Right Restrictions Weight Bearing Restrictions: Yes RLE Weight Bearing: Non weight bearing Other Position/Activity Restrictions: ok for ROM to the knee      Mobility Bed Mobility               General bed mobility comments: patient deferred out of bed to the recliner Patient Response: Anxious                                                                    ADL either performed or assessed with clinical judgement   ADL Overall ADL's : Needs assistance/impaired Eating/Feeding: Independent;Bed level   Grooming: Wash/dry hands;Wash/dry face;Set up;Bed level   Upper Body Bathing: Minimal assistance;Bed level Upper Body Bathing Details (indicate cue type and reason): assist with back from staff Lower Body Bathing: Moderate assistance;Bed level   Upper Body Dressing : Minimal assistance;Bed level   Lower Body Dressing: Maximal assistance;Bed level                       Vision Patient Visual Report: No change from baseline                  Pertinent Vitals/Pain Pain Assessment: Faces Faces Pain Scale: Hurts worst Pain Location: R leg Pain Descriptors / Indicators: Operative site guarding;Discomfort;Grimacing;Guarding;Sharp;Shooting;Stabbing Pain Intervention(s): Limited activity within patient's tolerance     Hand Dominance Right   Extremity/Trunk Assessment Upper Extremity Assessment Upper Extremity Assessment: Overall WFL for tasks assessed   Lower Extremity Assessment Lower Extremity Assessment: Defer to PT evaluation   Cervical / Trunk Assessment Cervical / Trunk Assessment: Normal     Communication Communication Communication: No difficulties   Cognition Arousal/Alertness: Awake/alert Behavior During Therapy: WFL for tasks assessed/performed Overall Cognitive Status: Within Functional Limits for tasks assessed                                                      Home Living Family/patient expects to be discharged to:: Private residence Living Arrangements: Non-relatives/Friends Available Help at Discharge: Friend(s);Available PRN/intermittently Type of Home: Apartment Home Access: Stairs to enter Entrance Stairs-Number of Steps: Stated he had a small threshold to enter the home   Home Layout: One level     Bathroom Shower/Tub: Tub/shower  unit;Walk-in shower   Bathroom Toilet: Standard     Home Equipment: None   Additional Comments: Patient not currenlty enrolled in classes.      Prior Functioning/Environment Level of Independence: Independent                 OT Problem List: Decreased range of motion;Decreased activity tolerance;Impaired balance (sitting and/or standing);Decreased knowledge of use of DME or AE;Decreased knowledge of precautions;Pain      OT Treatment/Interventions: Self-care/ADL training;DME and/or AE instruction;Therapeutic activities;Balance training    OT Goals(Current goals can be found in the care plan section) Acute Rehab OT Goals Patient Stated Goal: Lessen the pain so I can move better OT Goal Formulation: With patient Time For Goal Achievement: 04/14/21 Potential to Achieve Goals: Good ADL Goals Pt Will Perform Grooming: with supervision;sitting;standing Pt Will Perform Lower Body Bathing: with supervision;sit to/from stand;with adaptive equipment Pt Will Perform Lower Body Dressing: with supervision;sit to/from stand;with adaptive equipment Pt Will Transfer to Toilet: with supervision;bedside commode;stand pivot transfer Pt Will Perform Toileting - Clothing Manipulation and hygiene: with modified independence;sitting/lateral leans  OT Frequency: Min 2X/week   Barriers to D/C:    none noted       Co-evaluation              AM-PAC OT "6 Clicks" Daily Activity     Outcome Measure Help from another person eating meals?: None Help from another person taking care of personal grooming?: A Little Help from another person toileting, which includes using toliet, bedpan, or urinal?: A Lot Help from another person bathing (including washing, rinsing, drying)?: A Lot Help from another person to put on and taking off regular upper body clothing?: A Lot Help from another person to put on and taking off regular lower body clothing?: A Lot 6 Click Score: 15   End of Session  Equipment Utilized During Treatment:  (R hinged brace)  Activity Tolerance: Patient limited by pain Patient left: in bed;with call bell/phone within reach  OT Visit Diagnosis: Unsteadiness on feet (R26.81);Pain Pain - Right/Left: Right Pain - part of body: Knee;Leg                Time: 1347-1401 OT Time Calculation (min): 14 min Charges:  OT General Charges $OT Visit: 1 Visit OT Evaluation $OT Eval Moderate Complexity: 1 Mod  03/31/2021  Rich, OTR/L  Acute Rehabilitation Services  Office:  [phone]   Richard D Popella 03/31/2021, 3:21 PM 

## 2021-03-31 NOTE — Progress Notes (Addendum)
PROGRESS NOTE    Manuel MarkerCedric Baker  ZOX:[id number] DOB: 04/13/1996 DOA: 03/28/2021 [facility]   Brief Narrative: 25 year old with past medical history significant for type I diabetic, gastroparesis, Hx Epstein anomaly S/P [facility], hypertension, GERD, chronic anxiety depression, THC use and cannabis hyperemesis syndrome, seizure disorder, history of CVA who [facility] due to nausea vomiting polyuria abdominal pain 1 day duration.  Patient has been having pain in his right knee after a fall 6-day prior to admission.  He supposed to have surgery with Dr. Jena GaussHaddix on 5/5.  The morning of admission he wake up with nausea vomiting.  Evaluation in the ED patient was found to be in DKA bicarb 19, anion gap 18, pH 7.5, mild transaminases    Assessment & Plan:   Principal Problem:   Closed fracture of lateral portion of right tibial plateau Active Problems:   DKA, type 1 (HCC)   Closed fracture of right tibial plateau  1-DKA type I: Patient was started on IV fluids insulin Gtt.  Gap close, he was transition to lantus , meal coverage and SSI./  Chest x ray negative.  Continue with lantus, meal coverage and SSI.  Am blood glucose 88, decrease lantus to 12 daily  2-Right Knee , closed fracture of lateral portion of right tibia plateau.  Open reduction and internal fixation of right tibial plateau fracture by Dr Jena GaussHaddix 5/06 Cardiology has clear patient for surgery.  Weight bearing status /post op DVT prophylaxis /wound care/ pain control per ortho Appreciate ortho input  Vitamin D Deficiency, start supplement  3-Right LE pain.  Doppler ordered , not done yet  4-Pseudohyponatremia; hyperglycemia corrected.  Resolved.   5-AKI: Likely related to DKA: Continue baseline 1.2 Presents with a creatinine of 2.2 Improved with IV fluids for another 24hrs, states poor oral intake Cr down to 1.2  6-Anemia of chronic disease in the  setting of heart transplant: Monitor hemoglobin Iron low, started supplement.   7-History of heart transplant: Heart failure/immunosuppressed status  Cardiology  following Continue with prednisone and tacrolimus.   8-Seizure disorder: Resume Vimpat  9-Hypertension: Continue with Norvasc, Cardura Will add PRN hydralazine.   GERD; Continue with PPI.  THC use disorder/history of cannabis hyperemesis syndrome Counseled on THC cessation at bedside.  Oral thrush: Started  Nystatin Hypokalemia;  Remain low, replace, recheck  In am,, check mag    Estimated body mass index is 17.96 kg/m as calculated from the following:   Height as of 02/20/21: 5\' 7"  (1.702 m).   Weight as of this encounter: 52 kg.   DVT prophylaxis: Lovenox Code Status: Full code Family Communication: Discussed with patient Disposition Plan:  Status is: Inpatient  Remains inpatient appropriate because:IV treatments appropriate due to intensity of illness or inability to take PO   Dispo:  Patient From: Home  Planned Disposition: await PT evaluation. Patient wants to go stay with grandmother post discharge  Medically stable for discharge: No          Consultants:   Cardiology  Ortho  Procedures:  Open reduction and internal fixation of right tibial plateau fracture by Dr Jena GaussHaddix 5/06  Antimicrobials:  Anti-infectives (From admission, onward)   Start     Dose/Rate Route Frequency Ordered Stop   03/30/21 1630  ceFAZolin (ANCEF) IVPB 2g/100 mL premix        2 g 200 mL/hr over 30 Minutes Intravenous Every 8 hours 03/30/21 1046 03/31/21 0959  03/30/21 1145  ceFAZolin (ANCEF) IVPB 2g/100 mL premix  Status:  Discontinued        2 g 200 mL/hr over 30 Minutes Intravenous On call to O.R. 03/30/21 1048 03/30/21 1051   03/30/21 0824  vancomycin (VANCOCIN) powder  Status:  Discontinued          As needed 03/30/21 0824 03/30/21 0933   03/29/21 0353  valGANciclovir (VALCYTE) 450 MG tablet TABS 900 mg         900 mg Oral Daily at bedtime 03/28/21 2114         Subjective: He c/o right  knee pain, requiring iv dilaudid for pain control Denies dyspnea.  No fever Reports poor oral intake  Objective: Vitals:   03/31/21 0000 03/31/21 0200 03/31/21 0400 03/31/21 0751  BP: (!) 157/89 (!) 158/90 (!) 168/91 (!) 163/95  Pulse: (!) 106 (!) 114 (!) 110 (!) 109  Resp: 19 17 20 15   Temp: 98.2 F (36.8 C) 98.1 F (36.7 C) 98.1 F (36.7 C) 98.7 F (37.1 C)  TempSrc: Oral Oral Oral Oral  SpO2: 100% 100% 100% 100%  Weight:        Intake/Output Summary (Last 24 hours) at 03/31/2021 1040 Last data filed at 03/31/2021 05/31/2021 Gross per 24 hour  Intake 1300.01 ml  Output 2700 ml  Net -1399.99 ml   Filed Weights   03/30/21 0500  Weight: 52 kg    Examination:  General exam: NAD Respiratory system: CTA Cardiovascular system: S 1, S 2 RRR Gastrointestinal system: BS present, soft, nt Central nervous system: Alert Extremities: right knee with dressing.   Data Reviewed: I have personally reviewed following labs and imaging studies  CBC: Recent Labs  Lab 03/28/21 1636 03/28/21 1920 03/29/21 0303 03/29/21 0640 03/30/21 0206 03/30/21 0853 03/31/21 0012  WBC 6.7  --  6.7  --  8.9  --  11.9*  HGB 9.3*   < > 7.7* 8.5* 8.0* 11.2* 8.3*  HCT 27.9*   < > 23.0* 25.6* 24.3* 33.0* 25.3*  MCV 69.4*  --  69.1*  --  70.0*  --  71.1*  PLT 460*  --  435*  --  457*  --  478*   < > = values in this interval not displayed.   Basic Metabolic Panel: Recent Labs  Lab 03/28/21 1636 03/28/21 1920 03/28/21 2138 03/29/21 0303 03/30/21 0206 03/30/21 0853 03/31/21 0012  NA 124*   < > 135 137 137 136 135  K 4.3   < > 4.0 3.6 3.2* 3.5 3.1*  CL 87*  --  101 100 103 101 99  CO2 19*  --  25 27 26   --  26  GLUCOSE 1,185*  --  545* 156* 136* 213* 88  BUN 25*  --  20 15 7  5* <5*  CREATININE 2.24*  --  2.03* 1.47* 1.23 0.90 1.12  CALCIUM 9.6  --  8.8* 9.3 8.8*  --  9.0  MG  --   --   --  2.1  --   --   --    PHOS  --   --   --  3.4  --   --   --    < > = values in this interval not displayed.   GFR: Estimated Creatinine Clearance: 75.4 mL/min (by C-G formula based on SCr of 1.12 mg/dL). Liver Function Tests: Recent Labs  Lab 03/28/21 1636  AST 53*  ALT 55*  ALKPHOS 141*  BILITOT 1.1  PROT 7.8  ALBUMIN 3.8   Recent Labs  Lab 03/28/21 1636  LIPASE 31   No results for input(s): AMMONIA in the last 168 hours. Coagulation Profile: No results for input(s): INR, PROTIME in the last 168 hours. Cardiac Enzymes: No results for input(s): CKTOTAL, CKMB, CKMBINDEX, TROPONINI in the last 168 hours. BNP (last 3 results) No results for input(s): PROBNP in the last 8760 hours. HbA1C: Recent Labs    03/29/21 0640  HGBA1C 10.6*   CBG: Recent Labs  Lab 03/30/21 1036 03/30/21 1229 03/30/21 1707 03/30/21 2005 03/31/21 0755  GLUCAP 238* 250* 112* 99 193*   Lipid Profile: No results for input(s): CHOL, HDL, LDLCALC, TRIG, CHOLHDL, LDLDIRECT in the last 72 hours. Thyroid Function Tests: No results for input(s): TSH, T4TOTAL, FREET4, T3FREE, THYROIDAB in the last 72 hours. Anemia Panel: Recent Labs    03/28/21 2229  VITAMINB12 618  FOLATE 12.5  FERRITIN 81  TIBC 357  IRON 36*   Sepsis Labs: No results for input(s): PROCALCITON, LATICACIDVEN in the last 168 hours.  Recent Results (from the past 240 hour(s))  SARS CORONAVIRUS 2 (TAT 6-24 HRS) Nasopharyngeal Nasopharyngeal Swab     Status: None   Collection Time: 03/28/21 12:10 PM   Specimen: Nasopharyngeal Swab  Result Value Ref Range Status   SARS Coronavirus 2 NEGATIVE NEGATIVE Final    Comment: (NOTE) SARS-CoV-2 target nucleic acids are NOT DETECTED.  The SARS-CoV-2 RNA is generally detectable in upper and lower respiratory specimens during the acute phase of infection. Negative results do not preclude SARS-CoV-2 infection, do not rule out co-infections with other pathogens, and should not be used as the sole basis for  treatment or other patient management decisions. Negative results must be combined with clinical observations, patient history, and epidemiological information. The expected result is Negative.  Fact Sheet for Patients: HairSlick.no  Fact Sheet for Healthcare Providers: quierodirigir.com  This test is not yet approved or cleared by the [place] FDA and  has been authorized for detection and/or diagnosis of SARS-CoV-2 by FDA under an Emergency Use Authorization (EUA). This EUA will remain  in effect (meaning this test can be used) for the duration of the COVID-19 declaration under Se ction 564(b)(1) of the Act, 21 U.S.C. section 360bbb-3(b)(1), unless the authorization is terminated or revoked sooner.  Performed at [facility], [address] [place], [place] [address]   MRSA PCR Screening     Status: None   Collection Time: 03/29/21  3:54 PM   Specimen: Nasal Mucosa; Nasopharyngeal  Result Value Ref Range Status   MRSA by PCR NEGATIVE NEGATIVE Final    Comment:        The GeneXpert MRSA Assay (FDA approved for NASAL specimens only), is one component of a comprehensive MRSA colonization surveillance program. It is not intended to diagnose MRSA infection nor to guide or monitor treatment for MRSA infections. Performed at [facility], [address] [place], [place] [address]          Radiology Studies: DG Knee Complete 4 Views Right  Result Date: 03/30/2021 CLINICAL DATA:  ORIF, tibial plateau fracture EXAM: RIGHT KNEE - COMPLETE 4+ VIEW; DG C-ARM 1-60 MIN COMPARISON:  03/30/2021 FLUOROSCOPY TIME:  1:11 FINDINGS: Intraoperative fluoroscopic images and postoperative radiographs demonstrate plate and screw fixation of the lateral tibial plateau. Persistent lipohemarthrosis. IMPRESSION: ORIF of the lateral tibial plateau. Electronically Signed   By: Lauralyn Primes M.D.   On: 03/30/2021 12:00   DG Knee  Right Port  Result Date: 03/30/2021 CLINICAL DATA:  Postop from repair right proximal tibia fracture. EXAM: PORTABLE RIGHT KNEE - 1-2 VIEW COMPARISON:  Right knee CT, 03/28/2021. FINDINGS: Lateral tibial plateau fracture has been reduced with a lateral fixation plate and fixation screws. The orthopedic hardware is well seated. The fracture is well aligned. The articular surface of the lateral tibial plateau appears congruent. No evidence of an operative complication. IMPRESSION: Well aligned proximal tibial fracture following ORIF. Electronically Signed   By: Amie Portland M.D.   On: 03/30/2021 15:06   DG C-Arm 1-60 Min  Result Date: 03/30/2021 CLINICAL DATA:  ORIF, tibial plateau fracture EXAM: RIGHT KNEE - COMPLETE 4+ VIEW; DG C-ARM 1-60 MIN COMPARISON:  03/30/2021 FLUOROSCOPY TIME:  1:11 FINDINGS: Intraoperative fluoroscopic images and postoperative radiographs demonstrate plate and screw fixation of the lateral tibial plateau. Persistent lipohemarthrosis. IMPRESSION: ORIF of the lateral tibial plateau. Electronically Signed   By: Lauralyn Primes M.D.   On: 03/30/2021 12:00        Scheduled Meds: . amLODipine  10 mg Oral Daily  . cholecalciferol  1,000 Units Oral Daily  . docusate sodium  100 mg Oral BID  . doxazosin  1 mg Oral QHS  . enoxaparin (LOVENOX) injection  40 mg Subcutaneous Daily  . ferrous sulfate  325 mg Oral Q breakfast  . insulin aspart  0-5 Units Subcutaneous QHS  . insulin aspart  0-9 Units Subcutaneous TID WC  . insulin aspart  3 Units Subcutaneous TID WC  . insulin glargine  14 Units Subcutaneous Daily  . lacosamide  50 mg Oral BID  . nystatin  5 mL Oral QID  . pantoprazole  40 mg Oral BID  . potassium chloride  40 mEq Oral Q4H  . pravastatin  40 mg Oral QHS  . predniSONE  2.5 mg Oral Q breakfast  . senna-docusate  2 tablet Oral BID  . sertraline  50 mg Oral Daily  . Tacrolimus ER  8 mg Oral Q breakfast   And  . Tacrolimus ER  2 mg Oral Q breakfast  .  valGANciclovir  900 mg Oral QHS   Continuous Infusions: . sodium chloride    . methocarbamol (ROBAXIN) IV       LOS: 3 days    Time spent: 25 minutes    Albertine Grates, MD PhD FACP [facility]   If 7PM-7AM, please contact night-coverage [email or web address]  03/31/2021, 10:40 AM

## 2021-03-31 NOTE — Evaluation (Signed)
Physical Therapy Evaluation Patient Details Name: Manuel Baker MRN: [id number] DOB: [birth year] Today's Date: 03/31/2021   History of Present Illness  25 y.o. male was injured Thursday 4/28, stating he fell twice while climbing stairs hitting his knee both times.  He underwent ORIF 5/5 to his R knee and is NWB.  PMH includes:Congenital heart disease     . Diabetes mellitus without complication (HCC)      type 1  . Heart transplant recipient [facility]) 2021  . Hypertension    . Seizures.  Clinical Impression  Pt was seen for mobility on side of bed, but only did lateral scooting and then back to bed.   Pt was medicated but waiting for more meds to handle his level of pain.  Pt is in a staired home in [place], but will not have to get up there to go home, as he is leaving with family to drive to [place] and into a level home.  Will not have stairs there, but will need a wheelchair for mobility if he is not managed for pain before leaving.  Will recommend he have one more therapy session to confirm plan for wheelchair vs having a walking device.  Pt states he has been home and not very active these last few weeks.  Follow for goals of acute PT.    Follow Up Recommendations Home health PT;Supervision for mobility/OOB;Supervision/Assistance - 24 hour    Equipment Recommendations  Wheelchair cushion (measurements PT);Wheelchair (measurements PT);Other (comment) (has crutches per pt)    Recommendations for Other Services       Precautions / Restrictions Precautions Precautions: Fall Precaution Comments: R foot drop Required Braces or Orthoses: Knee Immobilizer - Right Restrictions Weight Bearing Restrictions: Yes RLE Weight Bearing: Non weight bearing Other Position/Activity Restrictions: may do ROM to R knee, not tolerated in AM      Mobility  Bed Mobility Overal bed mobility: Needs Assistance             General bed mobility comments: able to scoot himself up in the bed with PT supporting  RLE Patient Response: Anxious  Transfers Overall transfer level: Needs assistance Equipment used: 1 person hand held assist Transfers: Lateral/Scoot Transfers;Sit to/from Stand Sit to Stand:  (declined by pt)        Lateral/Scoot Transfers: Min assist General transfer comment: min assist to move RLE,  but pt will not try to stand  Ambulation/Gait             General Gait Details: deferred  Stairs            Wheelchair Mobility    Modified Rankin (Stroke Patients Only)       Balance Overall balance assessment: Needs assistance Sitting-balance support: Single extremity supported (RLE off floor and propped on PT foot) Sitting balance-Leahy Scale: Fair                                       Pertinent Vitals/Pain Pain Assessment: Faces Pain Score: 10-Worst pain ever Faces Pain Scale: Hurts worst Pain Location: R leg Pain Descriptors / Indicators: Operative site guarding Pain Intervention(s): Limited activity within patient's tolerance;Repositioned;Premedicated before session;Patient requesting pain meds-RN notified    Home Living Family/patient expects to be discharged to:: Private residence Living Arrangements: Non-relatives/Friends Available Help at Discharge: Friend(s);Available PRN/intermittently Type of Home: Apartment Home Access: Stairs to enter   Entrance Stairs-Number of Steps: Patient did not  mention 3rd floor apartment.  Stated he had a small threshold to enter the home Home Layout: One level Home Equipment: None Additional Comments: Pt is going home in car, did not use AD previously but reports he was in the bed most of the time from his heart surgery    Prior Function Level of Independence: Independent               Hand Dominance   Dominant Hand: Right    Extremity/Trunk Assessment   Upper Extremity Assessment Upper Extremity Assessment: Defer to OT evaluation    Lower Extremity Assessment Lower Extremity  Assessment: RLE deficits/detail RLE Deficits / Details: in R knee brace, not able to tolerate movement RLE: Unable to fully assess due to pain    Cervical / Trunk Assessment Cervical / Trunk Assessment: Normal  Communication   Communication: No difficulties  Cognition Arousal/Alertness: Awake/alert Behavior During Therapy: Anxious Overall Cognitive Status: Difficult to assess                                 General Comments: speaks in short phrases and focused on R knee      General Comments General comments (skin integrity, edema, etc.): pt is in a lot of pain and declining to do much, but is expecting to go home with family by tomorrow likely.  Will need to be premedicated but waiting for meds when PT arrived    Exercises     Assessment/Plan    PT Assessment Patient needs continued PT services  PT Problem List Decreased range of motion;Decreased activity tolerance;Decreased balance;Decreased mobility;Decreased coordination;Decreased cognition;Decreased strength;Decreased knowledge of use of DME;Decreased safety awareness;Cardiopulmonary status limiting activity;Decreased skin integrity;Pain       PT Treatment Interventions DME instruction;Gait training;Stair training;Functional mobility training;Therapeutic activities;Therapeutic exercise;Balance training;Neuromuscular re-education;Patient/family education    PT Goals (Current goals can be found in the Care Plan section)  Acute Rehab PT Goals Patient Stated Goal: to get pain managed PT Goal Formulation: With patient Time For Goal Achievement: 04/14/21 Potential to Achieve Goals: Good    Frequency Min 5X/week   Barriers to discharge Inaccessible home environment;Decreased caregiver support home with stairs which was on chart and not mentioned by pt    Co-evaluation               AM-PAC PT "6 Clicks" Mobility  Outcome Measure Help needed turning from your back to your side while in a flat bed  without using bedrails?: A Little Help needed moving from lying on your back to sitting on the side of a flat bed without using bedrails?: A Little Help needed moving to and from a bed to a chair (including a wheelchair)?: A Little Help needed standing up from a chair using your arms (e.g., wheelchair or bedside chair)?: Total Help needed to walk in hospital room?: Total Help needed climbing 3-5 steps with a railing? : Total 6 Click Score: 12    End of Session   Activity Tolerance: Patient limited by fatigue;Patient limited by pain Patient left: in bed;with call bell/phone within reach;with bed alarm set;with family/visitor present Nurse Communication: Mobility status;Patient requests pain meds;Weight bearing status PT Visit Diagnosis: Muscle weakness (generalized) (M62.81);Difficulty in walking, not elsewhere classified (R26.2);Pain Pain - Right/Left: Right Pain - part of body: Knee    Time: 3267-1245 PT Time Calculation (min) (ACUTE ONLY): 26 min   Charges:   PT Evaluation $PT Eval Moderate Complexity: 1  Mod PT Treatments $Therapeutic Activity: 8-22 mins       Ivar Drape 03/31/2021, 4:11 PM  Samul Dada, PT MS Acute Rehab Dept. Number: [facility] [id number] and [facility] [phone]

## 2021-04-01 ENCOUNTER — Encounter (HOSPITAL_COMMUNITY): Payer: Self-pay | Admitting: Student

## 2021-04-01 DIAGNOSIS — S82121A Displaced fracture of lateral condyle of right tibia, initial encounter for closed fracture: Secondary | ICD-10-CM | POA: Diagnosis not present

## 2021-04-01 LAB — BASIC METABOLIC PANEL
Anion gap: 9 (ref 5–15)
BUN: <5 mg/dL — ABNORMAL LOW (ref 6–20)
CO2: 24 mmol/L (ref 22–32)
Calcium: 8.6 mg/dL — ABNORMAL LOW (ref 8.9–10.3)
Chloride: 101 mmol/L (ref 98–111)
Creatinine, Ser: 1.05 mg/dL (ref 0.61–1.24)
GFR, Estimated: >60 mL/min (ref >60)
Glucose, Bld: 92 mg/dL (ref 70–99)
Potassium: 3.9 mmol/L (ref 3.5–5.1)
Sodium: 134 mmol/L — ABNORMAL LOW (ref 135–145)

## 2021-04-01 LAB — CBC
HCT: 23.6 % — ABNORMAL LOW (ref 39.0–52.0)
Hemoglobin: 7.8 g/dL — ABNORMAL LOW (ref 13.0–17.0)
MCH: 23.4 pg — ABNORMAL LOW (ref 26.0–34.0)
MCHC: 33.1 g/dL (ref 30.0–36.0)
MCV: 70.7 fL — ABNORMAL LOW (ref 80.0–100.0)
Platelets: 426 10*3/uL — ABNORMAL HIGH (ref 150–400)
RBC: 3.34 MIL/uL — ABNORMAL LOW (ref 4.22–5.81)
RDW: 16.6 % — ABNORMAL HIGH (ref 11.5–15.5)
WBC: 8.6 10*3/uL (ref 4.0–10.5)
nRBC: 0 % (ref 0.0–0.2)

## 2021-04-01 LAB — GLUCOSE, CAPILLARY
Glucose-Capillary: 179 mg/dL — ABNORMAL HIGH (ref 70–99)
Glucose-Capillary: 282 mg/dL — ABNORMAL HIGH (ref 70–99)
Glucose-Capillary: 164 mg/dL — ABNORMAL HIGH (ref 70–99)
Glucose-Capillary: 110 mg/dL — ABNORMAL HIGH (ref 70–99)
Glucose-Capillary: 58 mg/dL — ABNORMAL LOW (ref 70–99)

## 2021-04-01 LAB — MAGNESIUM: Magnesium: 1.4 mg/dL — ABNORMAL LOW (ref 1.7–2.4)

## 2021-04-01 LAB — TACROLIMUS LEVEL: Tacrolimus (FK506) - LabCorp: 1.3 ng/mL — ABNORMAL LOW (ref 2.0–20.0)

## 2021-04-01 MED ORDER — METOPROLOL TARTRATE 12.5 MG HALF TABLET
12.5000 mg | ORAL_TABLET | Freq: Two times a day (BID) | ORAL | Status: DC
  Administered 2021-04-01 – 2021-04-03 (×5): 12.5 mg via ORAL
  Filled 2021-04-01 (×5): qty 1

## 2021-04-01 MED ORDER — MAGNESIUM SULFATE 2 GM/50ML IV SOLN
2.0000 g | Freq: Once | INTRAVENOUS | Status: AC
  Administered 2021-04-01: 2 g via INTRAVENOUS
  Filled 2021-04-01: qty 50

## 2021-04-01 MED ORDER — POLYETHYLENE GLYCOL 3350 17 G PO PACK: 17.0000 g | PACK | Freq: Every day | ORAL | Status: DC

## 2021-04-01 NOTE — Progress Notes (Signed)
Spoke with the Vascular Department.  The RLE [place] has been attempted several times.  To complete the [place] post surgery the surgical wrappings will have to be removed and the skin compressed to procure a good view of the veins.  The patient states his pain level is still high (8/10) and he doesn't think he can tolerate the [place].  Attempted to message Dr. Sunnie Nielsen, but she is unavailable.

## 2021-04-01 NOTE — Progress Notes (Addendum)
PROGRESS NOTE    Manuel Baker  JEH:[id number] DOB: [birth year] DOA: 03/28/2021 PCP: Demaris Callander  [employer]   Brief Narrative: 25 year old with past medical history significant for type I diabetic, gastroparesis, Hx Epstein anomaly S/P heart transplant at [facility], hypertension, GERD, chronic anxiety depression, THC use and cannabis hyperemesis syndrome, seizure disorder, history of CVA who presents to [facility], ED via EMS due to nausea vomiting polyuria abdominal pain 1 day duration.  Patient has been having pain in his right knee after a fall 6-day prior to admission.  He supposed to have surgery with Dr. Jena Gauss on 5/5.  The morning of admission he wake up with nausea vomiting.  Evaluation in the ED patient was found to be in DKA bicarb 19, anion gap 18, pH 7.5, mild transaminases    Assessment & Plan:   Principal Problem:   Closed fracture of lateral portion of right tibial plateau Active Problems:   DKA, type 1 (HCC)   Closed fracture of right tibial plateau  1-DKA type I: Patient was started on IV fluids insulin Gtt.  Gap close, he was transition to lantus , meal coverage and SSI./  Chest x ray negative.  Continue with lantus, meal coverage and SSI.  lantus reduce to 12 unit due to low CBG.  He report poor appetite.   2-Right Knee , closed fracture of lateral portion of right tibia plateau.  Open reduction and internal fixation of right tibial plateau fracture by Dr Jena Gauss 5/06 Cardiology has clear patient for surgery.  Weight bearing status /post op DVT prophylaxis /wound care/ pain control per ortho Appreciate ortho input  Vitamin D Deficiency, started  supplement  3-Right LE pain.  Doppler ordered , not done yet. Doppler to be done tomorrow if  Dressing can be remove. Will ask ortho  4-Pseudohyponatremia; hyperglycemia corrected.  Resolved.   5-AKI: Likely related to DKA: Continue baseline 1.2 Presents with a creatinine of 2.2 Improved with IV fluids for  another 24hrs, states poor oral intake Cr down to 1.2  6-Anemia of chronic disease in the setting of heart transplant: Monitor hemoglobin Iron low, started supplement.   7-History of heart transplant: Heart failure/immunosuppressed status  Cardiology  following Continue with prednisone and tacrolimus.   8-Seizure disorder: Resume Vimpat  9-Hypertension: Continue with Norvasc, Cardura On PRN hydralazine.  Start low dose metoprolol  GERD; Continue with PPI.  THC use disorder/history of cannabis hyperemesis syndrome Counseled on THC cessation at bedside.  Oral thrush: Started  Nystatin. improved Hypokalemia;  Replaced.  Hypomagnesemia; replete IV>  Constipation; start miralax.    Estimated body mass index is 18.71 kg/m as calculated from the following:   Height as of 02/20/21: 5\' 7"  (1.702 m).   Weight as of this encounter: 54.2 kg.   DVT prophylaxis: Lovenox Code Status: Full code Family Communication: Discussed with patient Disposition Plan:  Status is: Inpatient  Remains inpatient appropriate because:IV treatments appropriate due to intensity of illness or inability to take PO   Dispo:  Patient From: Home  Planned Disposition: await PT evaluation. Patient wants to go stay with grandmother post discharge  Medically stable for discharge: No          Consultants:   Cardiology  Ortho  Procedures:  Open reduction and internal fixation of right tibial plateau fracture by Dr 5/06  Antimicrobials:  Anti-infectives (From admission, onward)   Start     Dose/Rate Route Frequency Ordered Stop   03/30/21 1630  ceFAZolin (ANCEF) IVPB 2g/100 mL  premix        2 g 200 mL/hr over 30 Minutes Intravenous Every 8 hours 03/30/21 1046 03/31/21 0959   03/30/21 1145  ceFAZolin (ANCEF) IVPB 2g/100 mL premix  Status:  Discontinued        2 g 200 mL/hr over 30 Minutes Intravenous On call to O.R. 03/30/21 1048 03/30/21 1051   03/30/21 0824  vancomycin (VANCOCIN)  powder  Status:  Discontinued          As needed 03/30/21 0824 03/30/21 0933   03/29/21 0353  valGANciclovir (VALCYTE) 450 MG tablet TABS 900 mg        900 mg Oral Daily at bedtime 03/28/21 2114         Subjective: Still having knee pain. Denies dyspnea.  He report poor appetite. No BM   Objective: Vitals:   03/31/21 2000 04/01/21 0004 04/01/21 0435 04/01/21 0743  BP: (!) 148/85 (!) 114/54 (!) 145/86 (!) 160/90  Pulse: (!) 106 (!) 102 98 (!) 103  Resp: 19 18 17 19   Temp: 99 F (37.2 C) 98.1 F (36.7 C) 98.1 F (36.7 C) 98.3 F (36.8 C)  TempSrc: Oral Oral Oral Oral  SpO2: 100% 100% 100% 100%  Weight:   54.2 kg     Intake/Output Summary (Last 24 hours) at 04/01/2021 1154 Last data filed at 04/01/2021 1128 Gross per 24 hour  Intake 770 ml  Output 1150 ml  Net -380 ml   Filed Weights   03/30/21 0500 04/01/21 0435  Weight: 52 kg 54.2 kg    Examination:  General exam: NAD Respiratory system: CTA Cardiovascular system: S 1, S 2 RRR Gastrointestinal system: BS present, soft  Central nervous system: alert Extremities: right knee with dressing.   Data Reviewed: I have personally reviewed following labs and imaging studies  CBC: Recent Labs  Lab 03/28/21 1636 03/28/21 1920 03/29/21 0303 03/29/21 0640 03/30/21 0206 03/30/21 0853 03/31/21 0012 04/01/21 0103  WBC 6.7  --  6.7  --  8.9  --  11.9* 8.6  HGB 9.3*   < > 7.7* 8.5* 8.0* 11.2* 8.3* 7.8*  HCT 27.9*   < > 23.0* 25.6* 24.3* 33.0* 25.3* 23.6*  MCV 69.4*  --  69.1*  --  70.0*  --  71.1* 70.7*  PLT 460*  --  435*  --  457*  --  478* 426*   < > = values in this interval not displayed.   Basic Metabolic Panel: Recent Labs  Lab 03/28/21 2138 03/29/21 0303 03/30/21 0206 03/30/21 0853 03/31/21 0012 04/01/21 0103  NA 135 137 137 136 135 134*  K 4.0 3.6 3.2* 3.5 3.1* 3.9  CL 101 100 103 101 99 101  CO2 25 27 26   --  26 24  GLUCOSE 545* 156* 136* 213* 88 92  BUN 20 15 7  5* <5* <5*  CREATININE 2.03* 1.47*  1.23 0.90 1.12 1.05  CALCIUM 8.8* 9.3 8.8*  --  9.0 8.6*  MG  --  2.1  --   --   --  1.4*  PHOS  --  3.4  --   --   --   --    GFR: Estimated Creatinine Clearance: 83.9 mL/min (by C-G formula based on SCr of 1.05 mg/dL). Liver Function Tests: Recent Labs  Lab 03/28/21 1636  AST 53*  ALT 55*  ALKPHOS 141*  BILITOT 1.1  PROT 7.8  ALBUMIN 3.8   Recent Labs  Lab 03/28/21 1636  LIPASE 31   No results  for input(s): AMMONIA in the last 168 hours. Coagulation Profile: No results for input(s): INR, PROTIME in the last 168 hours. Cardiac Enzymes: No results for input(s): CKTOTAL, CKMB, CKMBINDEX, TROPONINI in the last 168 hours. BNP (last 3 results) No results for input(s): PROBNP in the last 8760 hours. HbA1C: No results for input(s): HGBA1C in the last 72 hours. CBG: Recent Labs  Lab 03/31/21 1623 03/31/21 1632 03/31/21 1713 03/31/21 2046 04/01/21 0748  GLUCAP 57* 55* 79 137* 179*   Lipid Profile: No results for input(s): CHOL, HDL, LDLCALC, TRIG, CHOLHDL, LDLDIRECT in the last 72 hours. Thyroid Function Tests: No results for input(s): TSH, T4TOTAL, FREET4, T3FREE, THYROIDAB in the last 72 hours. Anemia Panel: No results for input(s): VITAMINB12, FOLATE, FERRITIN, TIBC, IRON, RETICCTPCT in the last 72 hours. Sepsis Labs: No results for input(s): PROCALCITON, LATICACIDVEN in the last 168 hours.  Recent Results (from the past 240 hour(s))  SARS CORONAVIRUS 2 (TAT 6-24 HRS) Nasopharyngeal Nasopharyngeal Swab     Status: None   Collection Time: 03/28/21 12:10 PM   Specimen: Nasopharyngeal Swab  Result Value Ref Range Status   SARS Coronavirus 2 NEGATIVE NEGATIVE Final    Comment: (NOTE) SARS-CoV-2 target nucleic acids are NOT DETECTED.  The SARS-CoV-2 RNA is generally detectable in upper and lower respiratory specimens during the acute phase of infection. Negative results do not preclude SARS-CoV-2 infection, do not rule out co-infections with other pathogens, and  should not be used as the sole basis for treatment or other patient management decisions. Negative results must be combined with clinical observations, patient history, and epidemiological information. The expected result is Negative.  Fact Sheet for Patients: HairSlick.nohttps://www.fda.gov/media/138098/download  Fact Sheet for Healthcare Providers: quierodirigir.comhttps://www.fda.gov/media/138095/download  This test is not yet approved or cleared by the [place] FDA and  has been authorized for detection and/or diagnosis of SARS-CoV-2 by FDA under an Emergency Use Authorization (EUA). This EUA will remain  in effect (meaning this test can be used) for the duration of the COVID-19 declaration under Se ction 564(b)(1) of the Act, 21 U.S.C. section 360bbb-3(b)(1), unless the authorization is terminated or revoked sooner.  Performed at [facility], [address] [place], [place] [address]   MRSA PCR Screening     Status: None   Collection Time: 03/29/21  3:54 PM   Specimen: Nasal Mucosa; Nasopharyngeal  Result Value Ref Range Status   MRSA by PCR NEGATIVE NEGATIVE Final    Comment:        The GeneXpert MRSA Assay (FDA approved for NASAL specimens only), is one component of a comprehensive MRSA colonization surveillance program. It is not intended to diagnose MRSA infection nor to guide or monitor treatment for MRSA infections. Performed at [facility], [address] [place], [place] [address]          Radiology Studies: No results found.      Scheduled Meds: . amLODipine  10 mg Oral Daily  . cholecalciferol  1,000 Units Oral Daily  . docusate sodium  100 mg Oral BID  . doxazosin  1 mg Oral QHS  . enoxaparin (LOVENOX) injection  40 mg Subcutaneous Daily  . ferrous sulfate  325 mg Oral Q breakfast  . insulin aspart  0-5 Units Subcutaneous QHS  . insulin aspart  0-9 Units Subcutaneous TID WC  . insulin aspart  3 Units Subcutaneous TID WC  . insulin glargine  12  Units Subcutaneous Daily  . lacosamide  50 mg Oral BID  .  nystatin  5 mL Oral QID  . pantoprazole  40 mg Oral BID  . polyethylene glycol  17 g Oral Daily  . pravastatin  40 mg Oral QHS  . predniSONE  2.5 mg Oral Q breakfast  . senna-docusate  2 tablet Oral BID  . sertraline  50 mg Oral Daily  . Tacrolimus ER  8 mg Oral Q breakfast   And  . Tacrolimus ER  2 mg Oral Q breakfast  . valGANciclovir  900 mg Oral QHS   Continuous Infusions: . methocarbamol (ROBAXIN) IV       LOS: 4 days    Time spent: 25 minutes    Alba Cory, MD PhD FACP [facility]   If 7PM-7AM, please contact night-coverage [email or web address]  04/01/2021, 11:54 AM

## 2021-04-01 NOTE — Progress Notes (Signed)
Physical Therapy Treatment Patient Details Name: Manuel Baker MRN: [id number] DOB: 07/23/1996 Today's Date: 04/01/2021    History of Present Illness 25 y.o. male presents to [facility] ED on 5/4 with reports of nausea/vomiting, polyuria, abdominal cramping, and R knee pain s/p fall. 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 PT also with R tibial plateau fx. Pt underwent RLE ORIF on 03/30/2021.    PT Comments    Pt is agreeable to OOB mobility. Pt demonstrates ability to perform bed mobility, transfers, and ambulation without physical assistance. Pt requires VCs for hand placement and R LE placement to maintain weightbearing precautions during transfers. Pt demonstrates ability to ambulate household distances with RW, limited by pain and fatigue. Pt and family report they will be driving to [place] and are educated on car transfers to protect R LE and to improve comfort. Pt will benefit from continued acute PT to address deficits in strength, endurance, and activity tolerance to improve independent mobility.  Follow Up Recommendations  Home health PT     Equipment Recommendations  Wheelchair (measurements PT);Wheelchair cushion (measurements PT);Rolling walker with 5" wheels    Recommendations for Other Services       Precautions / Restrictions Precautions Precautions: Fall Precaution Comments: R foot drop Required Braces or Orthoses: Knee Immobilizer - Right (however knee ROM ok per ortho) Restrictions Weight Bearing Restrictions: Yes RLE Weight Bearing: Non weight bearing    Mobility  Bed Mobility Overal bed mobility: Modified Independent Bed Mobility: Supine to Sit     Supine to sit: HOB elevated;Modified independent (Device/Increase time)          Transfers Overall transfer level: Needs assistance Equipment used: Rolling walker (2 wheeled) Transfers: Sit to/from Stand Sit to Stand: Min guard         General transfer comment: VCs for hand placement and power to  stand.  Ambulation/Gait Ambulation/Gait assistance: Min guard Gait Distance (Feet): 20 Feet Assistive device: Rolling walker (2 wheeled) Gait Pattern/deviations:  (Hop to gait with RW.) Gait velocity: decreased Gait velocity interpretation: 1.31 - 2.62 ft/sec, indicative of limited community Optometrist    Modified Rankin (Stroke Patients Only)       Balance Overall balance assessment: Needs assistance Sitting-balance support: Single extremity supported;Bilateral upper extremity supported;No upper extremity supported (L foot supported) Sitting balance-Leahy Scale: Fair     Standing balance support: Bilateral upper extremity supported Standing balance-Leahy Scale: Poor Standing balance comment: Pt requires bil UE support from RW as he is NWB on R LE.                            Cognition Arousal/Alertness: Awake/alert Behavior During Therapy: WFL for tasks assessed/performed Overall Cognitive Status: Within Functional Limits for tasks assessed                                        Exercises      General Comments General comments (skin integrity, edema, etc.): Pt educated on car transfer. Pt reports he has the capacity to perform functional mobility and ambulate, but is limited by his pain.      Pertinent Vitals/Pain Pain Assessment: 0-10 Pain Score: 6  Pain Location: R leg Pain Descriptors / Indicators: Grimacing;Discomfort;Operative site guarding Pain Intervention(s): Limited activity within patient's tolerance;Monitored during session  Home Living                      Prior Function            PT Goals (current goals can now be found in the care plan section) Acute Rehab PT Goals Patient Stated Goal: To manage pain and return home. Progress towards PT goals: Progressing toward goals    Frequency    Min 5X/week      PT Plan Current plan remains appropriate     Co-evaluation              AM-PAC PT "6 Clicks" Mobility   Outcome Measure  Help needed turning from your back to your side while in a flat bed without using bedrails?: None Help needed moving from lying on your back to sitting on the side of a flat bed without using bedrails?: None Help needed moving to and from a bed to a chair (including a wheelchair)?: A Little Help needed standing up from a chair using your arms (e.g., wheelchair or bedside chair)?: A Little Help needed to walk in hospital room?: A Little Help needed climbing 3-5 steps with a railing? : A Lot 6 Click Score: 19    End of Session Equipment Utilized During Treatment: Gait belt Activity Tolerance: Patient limited by pain;Patient limited by fatigue Patient left: in bed;with call bell/phone within reach;with bed alarm set;with family/visitor present Nurse Communication: Mobility status PT Visit Diagnosis: Muscle weakness (generalized) (M62.81);Difficulty in walking, not elsewhere classified (R26.2);Pain Pain - Right/Left: Right Pain - part of body: Knee     Time: 2202-5427 PT Time Calculation (min) (ACUTE ONLY): 23 min  Charges:  $Gait Training: 8-22 mins $Therapeutic Activity: 8-22 mins                     Acute Rehab  Pager: [phone]    Waldemar Dickens, SPT  04/01/2021, 2:46 PM

## 2021-04-01 NOTE — Progress Notes (Signed)
Subjective: 2 Days Post-Op Procedure(s) (LRB): OPEN REDUCTION INTERNAL FIXATION (ORIF) TIBIAL PLATEAU (Right) Patient reports pain has improved with medications. Severe pain will come and go.   Objective: Vital signs in last 24 hours: Temp:  [98.1 F (36.7 C)-99 F (37.2 C)] 98.3 F (36.8 C) (05/08 0743) Pulse Rate:  [98-115] 103 (05/08 0743) Resp:  [16-19] 19 (05/08 0743) BP: (114-160)/(54-90) 160/90 (05/08 0743) SpO2:  [100 %] 100 % (05/08 0743) Weight:  [54.2 kg] 54.2 kg (05/08 0435)  Intake/Output from previous day: 05/07 0701 - 05/08 0700 In: 770 [P.O.:770] Out: 1450 [Urine:1450] Intake/Output this shift: Total I/O In: -  Out: 500 [Urine:500]  Recent Labs    03/30/21 0206 03/30/21 0853 03/31/21 0012 04/01/21 0103  HGB 8.0* 11.2* 8.3* 7.8*   Recent Labs    03/31/21 0012 04/01/21 0103  WBC 11.9* 8.6  RBC 3.56* 3.34*  HCT 25.3* 23.6*  PLT 478* 426*   Recent Labs    03/31/21 0012 04/01/21 0103  NA 135 134*  K 3.1* 3.9  CL 99 101  CO2 26 24  BUN <5* <5*  CREATININE 1.12 1.05  GLUCOSE 88 92  CALCIUM 9.0 8.6*   No results for input(s): LABPT, INR in the last 72 hours.  Neurovascular intact Sensation intact distally Intact pulses distally Dorsiflexion/Plantar flexion intact Incision: dressing C/D/I Compartment soft Dressings removed. Incisions CDI. Steri-strips in place. New ACE bandage placed per patient request. Brace adjusted.   Assessment/Plan: 2 Days Post-Op Procedure(s) (LRB): OPEN REDUCTION INTERNAL FIXATION (ORIF) TIBIAL PLATEAU (Right) Up with therapy NWB RLE, ROM knee ok Pain control as ordered, make sure to get something before working with PT lovenox dvt proph Incision: okay to leave incision open to air. Keep clean and dry.     Jerald Kief Henrick Mcgue 04/01/2021, 12:10 PM

## 2021-04-02 ENCOUNTER — Inpatient Hospital Stay (HOSPITAL_COMMUNITY): Payer: No Typology Code available for payment source

## 2021-04-02 ENCOUNTER — Other Ambulatory Visit (HOSPITAL_COMMUNITY): Payer: Self-pay

## 2021-04-02 DIAGNOSIS — M79604 Pain in right leg: Secondary | ICD-10-CM

## 2021-04-02 DIAGNOSIS — S82121A Displaced fracture of lateral condyle of right tibia, initial encounter for closed fracture: Secondary | ICD-10-CM | POA: Diagnosis not present

## 2021-04-02 LAB — CBC
HCT: 23.3 % — ABNORMAL LOW (ref 39.0–52.0)
Hemoglobin: 7.8 g/dL — ABNORMAL LOW (ref 13.0–17.0)
MCH: 23.4 pg — ABNORMAL LOW (ref 26.0–34.0)
MCHC: 33.5 g/dL (ref 30.0–36.0)
MCV: 70 fL — ABNORMAL LOW (ref 80.0–100.0)
Platelets: 503 10*3/uL — ABNORMAL HIGH (ref 150–400)
RBC: 3.33 MIL/uL — ABNORMAL LOW (ref 4.22–5.81)
RDW: 16.6 % — ABNORMAL HIGH (ref 11.5–15.5)
WBC: 8.8 10*3/uL (ref 4.0–10.5)
nRBC: 0 % (ref 0.0–0.2)

## 2021-04-02 LAB — BASIC METABOLIC PANEL
Anion gap: 8 (ref 5–15)
BUN: 5 mg/dL — ABNORMAL LOW (ref 6–20)
CO2: 26 mmol/L (ref 22–32)
Calcium: 8.7 mg/dL — ABNORMAL LOW (ref 8.9–10.3)
Chloride: 97 mmol/L — ABNORMAL LOW (ref 98–111)
Creatinine, Ser: 1.05 mg/dL (ref 0.61–1.24)
GFR, Estimated: >60 mL/min (ref >60)
Glucose, Bld: 284 mg/dL — ABNORMAL HIGH (ref 70–99)
Potassium: 3.9 mmol/L (ref 3.5–5.1)
Sodium: 131 mmol/L — ABNORMAL LOW (ref 135–145)

## 2021-04-02 LAB — GLUCOSE, CAPILLARY
Glucose-Capillary: 102 mg/dL — ABNORMAL HIGH (ref 70–99)
Glucose-Capillary: 260 mg/dL — ABNORMAL HIGH (ref 70–99)
Glucose-Capillary: 273 mg/dL — ABNORMAL HIGH (ref 70–99)
Glucose-Capillary: 330 mg/dL — ABNORMAL HIGH (ref 70–99)

## 2021-04-02 LAB — MAGNESIUM: Magnesium: 1.6 mg/dL — ABNORMAL LOW (ref 1.7–2.4)

## 2021-04-02 MED ORDER — INSULIN GLARGINE 100 UNIT/ML ~~LOC~~ SOLN
2.0000 [IU] | Freq: Once | SUBCUTANEOUS | Status: AC
  Administered 2021-04-02: 2 [IU] via SUBCUTANEOUS
  Filled 2021-04-02: qty 0.02

## 2021-04-02 MED ORDER — INSULIN GLARGINE 100 UNIT/ML ~~LOC~~ SOLN
14.0000 [IU] | Freq: Every day | SUBCUTANEOUS | Status: DC
  Administered 2021-04-03: 14 [IU] via SUBCUTANEOUS
  Filled 2021-04-02: qty 0.14

## 2021-04-02 MED ORDER — INSULIN ASPART 100 UNIT/ML IJ SOLN
4.0000 [IU] | Freq: Three times a day (TID) | INTRAMUSCULAR | Status: DC
  Administered 2021-04-03: 4 [IU] via SUBCUTANEOUS

## 2021-04-02 MED ORDER — HYDROMORPHONE HCL 1 MG/ML IJ SOLN: 0.5000 mg | INTRAMUSCULAR | Status: DC | PRN

## 2021-04-02 MED ORDER — BISACODYL 10 MG RE SUPP
10.0000 mg | Freq: Once | RECTAL | Status: DC
  Filled 2021-04-02: qty 1

## 2021-04-02 MED ORDER — METHOCARBAMOL 500 MG PO TABS
500.0000 mg | ORAL_TABLET | Freq: Four times a day (QID) | ORAL | 0 refills | Status: AC | PRN
  Filled 2021-04-02: qty 28, 7d supply, fill #0

## 2021-04-02 MED ORDER — TRAMADOL HCL 50 MG PO TABS
50.0000 mg | ORAL_TABLET | Freq: Three times a day (TID) | ORAL | Status: DC | PRN
  Administered 2021-04-02 (×2): 50 mg via ORAL
  Filled 2021-04-02 (×4): qty 1

## 2021-04-02 MED ORDER — MAGNESIUM SULFATE 2 GM/50ML IV SOLN
2.0000 g | Freq: Once | INTRAVENOUS | Status: AC
  Administered 2021-04-02: 2 g via INTRAVENOUS
  Filled 2021-04-02: qty 50

## 2021-04-02 MED ORDER — VITAMIN D3 25 MCG PO TABS
1000.0000 [IU] | ORAL_TABLET | Freq: Every day | ORAL | 0 refills | Status: AC
  Filled 2021-04-02: qty 30, 30d supply, fill #0

## 2021-04-02 MED ORDER — OXYCODONE HCL 10 MG PO TABS
5.0000 mg | ORAL_TABLET | ORAL | 0 refills | Status: AC | PRN
  Filled 2021-04-02: qty 42, 7d supply, fill #0

## 2021-04-02 MED ORDER — ENOXAPARIN SODIUM 40 MG/0.4ML IJ SOSY
40.0000 mg | PREFILLED_SYRINGE | Freq: Every day | INTRAMUSCULAR | 0 refills | Status: AC
  Filled 2021-04-02: qty 11.2, 28d supply, fill #0

## 2021-04-02 MED ORDER — POLYETHYLENE GLYCOL 3350 17 G PO PACK
17.0000 g | PACK | Freq: Two times a day (BID) | ORAL | Status: DC
  Administered 2021-04-02 – 2021-04-03 (×2): 17 g via ORAL
  Filled 2021-04-02 (×2): qty 1

## 2021-04-02 NOTE — Progress Notes (Signed)
Physical Therapy Treatment Patient Details Name: Manuel Baker MRN: [id number] DOB: [birth year] Today's Date: 04/02/2021    History of Present Illness 25 y.o. male presents to [facility] ED on 5/4 with reports of nausea/vomiting, polyuria, abdominal cramping, and R knee pain s/p fall. 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 PT also with R tibial plateau fx. Pt underwent RLE ORIF on 03/30/2021.    PT Comments    Patient received in bed, lethargic after having received IV pain medications and asks if PT can return later before DC. Family did have questions about gait belt use and car transfers, so I demonstrated belt use on myself and discussed/demonstrated car transfers in the room with R LE NWB as well. Pt left in bed with all needs met, family present. Will attempt to return later if time/schedule allow- however note he was moving very well with PT yesterday so do not anticipate any therapy related difficulties at DC.     Follow Up Recommendations  Home health PT     Equipment Recommendations  Wheelchair (measurements PT);Wheelchair cushion (measurements PT);Rolling walker with 5" wheels    Recommendations for Other Services       Precautions / Restrictions Precautions Precautions: Fall Precaution Comments: R LE NWB with KI Required Braces or Orthoses: Knee Immobilizer - Right Restrictions Weight Bearing Restrictions: Yes RLE Weight Bearing: Non weight bearing Other Position/Activity Restrictions: OK to do R knee ROM per ortho    Mobility  Bed Mobility               General bed mobility comments: deferred- lethargic    Transfers                 General transfer comment: deferred- lethargic  Ambulation/Gait             General Gait Details: deferred- lethargic   Stairs             Wheelchair Mobility    Modified Rankin (Stroke Patients Only)       Balance                                            Cognition Arousal/Alertness: Suspect due to  medications Behavior During Therapy: Flat affect Overall Cognitive Status: Within Functional Limits for tasks assessed                                 General Comments: very lethargic but had just gotten IV pain medication before PT entered room- suspect due to medications      Exercises      General Comments General comments (skin integrity, edema, etc.): lethargic after pain meds- asks if PT can come back later for PT, but family did have questions about car transfers      Pertinent Vitals/Pain Pain Assessment: Faces Faces Pain Scale: No hurt Pain Intervention(s): Monitored during session    Home Living                      Prior Function            PT Goals (current goals can now be found in the care plan section) Acute Rehab PT Goals Patient Stated Goal: To manage pain and return home. PT Goal Formulation: With patient Time For Goal Achievement: 04/14/21 Potential to Achieve Goals: Good  Progress towards PT goals: Progressing toward goals    Frequency    Min 5X/week      PT Plan Current plan remains appropriate    Co-evaluation              AM-PAC PT "6 Clicks" Mobility   Outcome Measure  Help needed turning from your back to your side while in a flat bed without using bedrails?: None Help needed moving from lying on your back to sitting on the side of a flat bed without using bedrails?: None Help needed moving to and from a bed to a chair (including a wheelchair)?: A Little Help needed standing up from a chair using your arms (e.g., wheelchair or bedside chair)?: A Little Help needed to walk in hospital room?: A Little Help needed climbing 3-5 steps with a railing? : A Lot 6 Click Score: 19    End of Session   Activity Tolerance: Patient limited by lethargy (after pain meds) Patient left: in bed;with call bell/phone within reach;with bed alarm set;with family/visitor present Nurse Communication: Mobility status PT Visit  Diagnosis: Muscle weakness (generalized) (M62.81);Difficulty in walking, not elsewhere classified (R26.2);Pain Pain - Right/Left: Right Pain - part of body: Knee     Time: 5643-3295 PT Time Calculation (min) (ACUTE ONLY): 9 min  Charges:     No charge                    Windell Norfolk, DPT, PN1   Supplemental Physical Therapist [place]    Pager [phone] Acute Rehab Office [phone]

## 2021-04-02 NOTE — Progress Notes (Signed)
Occupational Therapy Treatment Patient Details Name: Manuel Baker MRN: [id number] DOB: 1996/07/20 Today's Date: 04/02/2021    History of present illness 25 y.o. male presents to [facility] ED on 5/4 with reports of nausea/vomiting, polyuria, abdominal cramping, and R knee pain s/p fall. 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 PT also with R tibial plateau fx. Pt underwent RLE ORIF on 03/30/2021.   OT comments  Pt continues to be limited by R LE pain but actively engaged in ADL technique education to maximize safety/independence. Pt able to demo bed mobility with Modified Independence using L LE to advance R LE. Pt able to demo ability to reach B feet without difficulty, likely progressed well enough to not require AE for tasks. Educated on compensatory strategies to use for LB ADLs, as well as clothing that may be easier to manage. Reinforced best positioning of R LE to maintain joint integrity/alignment. Family present and engaged with discussion on best options for showering tasks (sitting on edge of tub vs use of shower chair in walk in shower appear as best options for pt), so equipment recs updated to reflect this discussion. Pt with increased pain sitting EOB even with R LE propped up, so pt declined OOB mobility attempts at this time. Plan to further address ADL transfers as appropriate at acute level.    Follow Up Recommendations  No OT follow up    Equipment Recommendations  Wheelchair (measurements OT);Wheelchair cushion (measurements OT);Tub/shower seat    Recommendations for Other Services      Precautions / Restrictions Precautions Precautions: Fall Precaution Comments: R LE NWB with KI Required Braces or Orthoses: Knee Immobilizer - Right Restrictions Weight Bearing Restrictions: Yes RLE Weight Bearing: Non weight bearing Other Position/Activity Restrictions: OK to do R knee ROM per ortho       Mobility Bed Mobility Overal bed mobility: Modified Independent Bed Mobility: Supine to Sit;Sit to  Supine           General bed mobility comments: able to use L LE to scoop and manage R LE to EOB and back without difficulty    Transfers                 General transfer comment: pt declined due to pain    Balance Overall balance assessment: Needs assistance Sitting-balance support: Single extremity supported;Bilateral upper extremity supported;No upper extremity supported Sitting balance-Leahy Scale: Good                                     ADL either performed or assessed with clinical judgement   ADL Overall ADL's : Needs assistance/impaired                     Lower Body Dressing: Minimal assistance;Sitting/lateral leans;Sit to/from stand Lower Body Dressing Details (indicate cue type and reason): Collaborated with pt on AE use. Pt able to demo ability to bend to R foot well. Discussed easier clothing to don/mgmt at home, as well as strategies to maximize independence (donning affected LE first).               General ADL Comments: Pt's family present and engaged. Discusssed home setup and collaborated with pt on strategies for safety in showered with tub shower vs walk in shower, as well as obtaining shower chair as needed. (Family may have access to one or BSC but unsure). Discussed proper positioning of R LE (knee to ceiling)  to maintain optimal joint alignment, positioning with car ride. Use of wheelchair for longer distances (bedroom to kitchen) and wc accessibility     Vision   Vision Assessment?: No apparent visual deficits   Perception     Praxis      Cognition Arousal/Alertness: Awake/alert Behavior During Therapy: Flat affect Overall Cognitive Status: Within Functional Limits for tasks assessed                                 General Comments: pleasant, cooperative        Exercises     Shoulder Instructions       General Comments Pt/family appreciative of education and for active listening during  session    Pertinent Vitals/ Pain       Pain Assessment: Faces Faces Pain Scale: Hurts even more Pain Location: R leg sitting EOB Pain Descriptors / Indicators: Grimacing;Discomfort;Operative site guarding Pain Intervention(s): Monitored during session;Premedicated before session;Repositioned;Limited activity within patient's tolerance  Home Living                                          Prior Functioning/Environment              Frequency  Min 2X/week        Progress Toward Goals  OT Goals(current goals can now be found in the care plan section)  Progress towards OT goals: Progressing toward goals  Acute Rehab OT Goals Patient Stated Goal: To manage pain and return home. OT Goal Formulation: With patient Time For Goal Achievement: 04/14/21 Potential to Achieve Goals: Good ADL Goals Pt Will Perform Grooming: with supervision;sitting;standing Pt Will Perform Lower Body Bathing: with supervision;sit to/from stand;with adaptive equipment Pt Will Perform Lower Body Dressing: with supervision;sit to/from stand;with adaptive equipment Pt Will Transfer to Toilet: with supervision;bedside commode;stand pivot transfer Pt Will Perform Toileting - Clothing Manipulation and hygiene: with modified independence;sitting/lateral leans  Plan Discharge plan remains appropriate;Equipment recommendations need to be updated    Co-evaluation                 AM-PAC OT "6 Clicks" Daily Activity     Outcome Measure   Help from another person eating meals?: None Help from another person taking care of personal grooming?: A Little Help from another person toileting, which includes using toliet, bedpan, or urinal?: A Little Help from another person bathing (including washing, rinsing, drying)?: A Little Help from another person to put on and taking off regular upper body clothing?: A Little Help from another person to put on and taking off regular lower body  clothing?: A Little 6 Click Score: 19    End of Session    OT Visit Diagnosis: Unsteadiness on feet (R26.81);Pain Pain - Right/Left: Right Pain - part of body: Knee;Leg   Activity Tolerance Patient limited by pain   Patient Left in bed;with call bell/phone within reach;with family/visitor present   Nurse Communication          Time: 2353-6144 OT Time Calculation (min): 18 min  Charges: OT General Charges $OT Visit: 1 Visit OT Treatments $Self Care/Home Management : 8-22 mins  Bradd Canary, OTR/L Acute Rehab Services Office: [phone]   Lorre Munroe 04/02/2021, 12:45 PM

## 2021-04-02 NOTE — Discharge Instructions (Signed)
Orthopaedic Trauma Service Discharge Instructions   General Discharge Instructions  WEIGHT BEARING STATUS: Non-weightbearing on right lower extremity  RANGE OF MOTION/ACTIVITY: Ok for knee motion as tolerated  Wound Care: Incisions can be left open to air if there is no drainage. If incision continues to have drainage, follow wound care instructions below. Okay to shower if no drainage from incisions.  DVT/PE prophylaxis: Lovenox  Diet: as you were eating previously.  Can use over the counter stool softeners and bowel preparations, such as Miralax, to help with bowel movements.  Narcotics can be constipating.  Be sure to drink plenty of fluids  PAIN MEDICATION USE AND EXPECTATIONS  You have likely been given narcotic medications to help control your pain.  After a traumatic event that results in an fracture (broken bone) with or without surgery, it is ok to use narcotic pain medications to help control one's pain.  We understand that everyone responds to pain differently and each individual patient will be evaluated on a regular basis for the continued need for narcotic medications. Ideally, narcotic medication use should last no more than 6-8 weeks (coinciding with fracture healing).   As a patient it is your responsibility as well to monitor narcotic medication use and report the amount and frequency you use these medications when you come to your office visit.   We would also advise that if you are using narcotic medications, you should take a dose prior to therapy to maximize you participation.  IF YOU ARE ON NARCOTIC MEDICATIONS IT IS NOT PERMISSIBLE TO OPERATE A MOTOR VEHICLE (MOTORCYCLE/CAR/TRUCK/MOPED) OR HEAVY MACHINERY DO NOT MIX NARCOTICS WITH OTHER CNS (CENTRAL NERVOUS SYSTEM) DEPRESSANTS SUCH AS ALCOHOL   STOP SMOKING OR USING NICOTINE PRODUCTS!!!!  As discussed nicotine severely impairs your body's ability to heal surgical and traumatic wounds but also impairs bone healing.   Wounds and bone heal by forming microscopic blood vessels (angiogenesis) and nicotine is a vasoconstrictor (essentially, shrinks blood vessels).  Therefore, if vasoconstriction occurs to these microscopic blood vessels they essentially disappear and are unable to deliver necessary nutrients to the healing tissue.  This is one modifiable factor that you can do to dramatically increase your chances of healing your injury.    (This means no smoking, no nicotine gum, patches, etc)  DO NOT USE NONSTEROIDAL ANTI-INFLAMMATORY DRUGS (NSAID'S)  Using products such as Advil (ibuprofen), Aleve (naproxen), Motrin (ibuprofen) for additional pain control during fracture healing can delay and/or prevent the healing response.  If you would like to take over the counter (OTC) medication, Tylenol (acetaminophen) is ok.  However, some narcotic medications that are given for pain control contain acetaminophen as well. Therefore, you should not exceed more than 4000 mg of tylenol in a day if you do not have liver disease.  Also note that there are may OTC medicines, such as cold medicines and allergy medicines that my contain tylenol as well.  If you have any questions about medications and/or interactions please ask your doctor/PA or your pharmacist.      ICE AND ELEVATE INJURED/OPERATIVE EXTREMITY  Using ice and elevating the injured extremity above your heart can help with swelling and pain control.  Icing in a pulsatile fashion, such as 20 minutes on and 20 minutes off, can be followed.    Do not place ice directly on skin. Make sure there is a barrier between to skin and the ice pack.    Using frozen items such as frozen peas works well as the  conform nicely to the are that needs to be iced.  USE AN ACE WRAP OR TED HOSE FOR SWELLING CONTROL  In addition to icing and elevation, Ace wraps or TED hose are used to help limit and resolve swelling.  It is recommended to use Ace wraps or TED hose until you are informed to  stop.    When using Ace Wraps start the wrapping distally (farthest away from the body) and wrap proximally (closer to the body)   Example: If you had surgery on your leg or thing and you do not have a splint on, start the ace wrap at the toes and work your way up to the thigh        If you had surgery on your upper extremity and do not have a splint on, start the ace wrap at your fingers and work your way up to the upper arm  CALL THE OFFICE WITH ANY QUESTIONS OR CONCERNS: [phone]   VISIT OUR WEBSITE FOR ADDITIONAL INFORMATION: [email or web address]     Discharge Wound Care Instructions  - Do NOT apply any ointments, solutions or lotions to pin sites or surgical wounds.  These prevent needed drainage and even though solutions like hydrogen peroxide kill bacteria, they also damage cells lining the pin sites that help fight infection.  Applying lotions or ointments can keep the wounds moist and can cause them to breakdown and open up as well. This can increase the risk for infection. When in doubt call the office.  - If any drainage is noted, use one layer of adaptic, then gauze, Kerlix, and an ace wrap.  - Once the incision is completely dry and without drainage, it may be left open to air out.  Showering may begin 36-48 hours later.  Cleaning gently with soap and water.

## 2021-04-02 NOTE — Progress Notes (Signed)
Orthopaedic Trauma Progress Note  SUBJECTIVE: Reports moderate pain about operative site at rest, combination of oral and IV pain medications helping. No chest pain. No SOB. No nausea/vomiting. No other complaints.  Grandma and aunt at bedside this morning.  Patient and family members are all hopeful that he will be able to be discharged later today back to [place], [place].  OBJECTIVE:  Vitals:   04/02/21 0401 04/02/21 0822  BP: 123/75 (!) 168/80  Pulse: 87 95  Resp: 19 18  Temp: 97.9 F (36.6 C) 97.8 F (36.6 C)  SpO2: 97% 95%    General: Laying in bed, resting comfortably.  No acute distress Respiratory: No increased work of breathing.  Right lower extremity: Dressing clean, dry, intact.  Knee brace in place.  Mild tenderness with palpation over the proximal tibia and throughout the lower leg but non-tender through the thigh.  Tolerates gentle knee motion.  Ankle unable to actively dorsiflex the ankle.  Notes this is baseline prior to surgery.  Unable to elicit EHL.  Ankle plantarflexion is intact.  Endorses sensation to light touch over all parts of the foot. + DP pulse  IMAGING: Stable post op imaging.   LABS:  Results for orders placed or performed during the hospital encounter of 03/28/21 (from the past 24 hour(s))  Glucose, capillary     Status: Abnormal   Collection Time: 04/01/21  1:02 PM  Result Value Ref Range   Glucose-Capillary 282 (H) 70 - 99 mg/dL  Glucose, capillary     Status: Abnormal   Collection Time: 04/01/21  5:21 PM  Result Value Ref Range   Glucose-Capillary 164 (H) 70 - 99 mg/dL  Glucose, capillary     Status: Abnormal   Collection Time: 04/01/21  8:45 PM  Result Value Ref Range   Glucose-Capillary 58 (L) 70 - 99 mg/dL  Glucose, capillary     Status: Abnormal   Collection Time: 04/01/21  9:13 PM  Result Value Ref Range   Glucose-Capillary 110 (H) 70 - 99 mg/dL  Glucose, capillary     Status: Abnormal   Collection Time: 04/01/21 11:59 PM  Result  Value Ref Range   Glucose-Capillary 260 (H) 70 - 99 mg/dL  Basic metabolic panel     Status: Abnormal   Collection Time: 04/02/21  1:00 AM  Result Value Ref Range   Sodium 131 (L) 135 - 145 mmol/L   Potassium 3.9 3.5 - 5.1 mmol/L   Chloride 97 (L) 98 - 111 mmol/L   CO2 26 22 - 32 mmol/L   Glucose, Bld 284 (H) 70 - 99 mg/dL   BUN 5 (L) 6 - 20 mg/dL   Creatinine, Ser 2.44 0.61 - 1.24 mg/dL   Calcium 8.7 (L) 8.9 - 10.3 mg/dL   GFR, Estimated >01 >02 mL/min   Anion gap 8 5 - 15  CBC     Status: Abnormal   Collection Time: 04/02/21  1:00 AM  Result Value Ref Range   WBC 8.8 4.0 - 10.5 K/uL   RBC 3.33 (L) 4.22 - 5.81 MIL/uL   Hemoglobin 7.8 (L) 13.0 - 17.0 g/dL   HCT 72.5 (L) 36.6 - 44.0 %   MCV 70.0 (L) 80.0 - 100.0 fL   MCH 23.4 (L) 26.0 - 34.0 pg   MCHC 33.5 30.0 - 36.0 g/dL   RDW 34.7 (H) 42.5 - 95.6 %   Platelets 503 (H) 150 - 400 K/uL   nRBC 0.0 0.0 - 0.2 %  Magnesium  Status: Abnormal   Collection Time: 04/02/21  1:00 AM  Result Value Ref Range   Magnesium 1.6 (L) 1.7 - 2.4 mg/dL  Glucose, capillary     Status: Abnormal   Collection Time: 04/02/21  8:25 AM  Result Value Ref Range   Glucose-Capillary 330 (H) 70 - 99 mg/dL    ASSESSMENT: Manuel Baker is a 25 y.o. male, 3 Days Post-Op s/p ORIF right tibial plateau fracture  CV/Blood loss: Acute blood loss anemia, Hgb 7.8 this morning.   PLAN: Weightbearing: NWB RLE Incisional and dressing care: Reinforce dressings as needed.  Okay for incisions to be left open to air Showering: Okay to begin showering with assistance.  Incisions may get wet Orthopedic device(s): Knee brace for comfort  Pain management:  1. Tylenol 650 mg q 6 hours PRN 2. Robaxin 500 mg q 6 hours PRN 3. Oxycodone 5-10 mg q 4 hours PRN 4. Dilaudid 0.5-1 mg q 3 hours PRN VTE prophylaxis: Lovenox, SCDs ID:  Ancef 2gm post op comepletd Foley/Lines:  No foley, KVO IVFs Impediments to Fracture Healing: Vitamin D level 10, started on D3  supplementation Dispo: Therapies as tolerated, PT/OT recommending HH therapies. Okay for discharge from ortho standpoint once cleared by medicine team and therapies. I have sent d/c rx for pain medication and DVT prophylaxis to [facility] pharmacy. Follow - up plan: On 04/10/2021 at 1:30PM  Contact information:  Truitt Merle MD, Ulyses Southward PA-C. After hours and holidays please check [email or web address] for group call information for [facility]   Zalman Hull A. Michaelyn Barter, PA-C [phone] (office) [email or web address]

## 2021-04-02 NOTE — Progress Notes (Signed)
PROGRESS NOTE    Manuel MarkerCedric Baker  WUJ:[id number] DOB: 03/28/1996 DOA: 03/28/2021 PCP: [facility]  [employer]   Brief Narrative: 25 year old with past medical history significant for type I diabetic, gastroparesis, Hx Epstein anomaly S/P [facility], hypertension, GERD, chronic anxiety depression, THC use and cannabis hyperemesis syndrome, seizure disorder, history of CVA who [facility] due to nausea vomiting polyuria abdominal pain 1 day duration.  Patient has been having pain in his right knee after a fall 6-day prior to admission.  He supposed to have surgery with Dr. Jena GaussHaddix on 5/5.  The morning of admission he wake up with nausea vomiting.  Evaluation in the ED patient was found to be in DKA bicarb 19, anion gap 18, pH 7.5, mild transaminases    Assessment & Plan:   Principal Problem:   Closed fracture of lateral portion of right tibial plateau Active Problems:   DKA, type 1 (HCC)   Closed fracture of right tibial plateau  1-DKA type I: Patient was started on IV fluids insulin Gtt.  Gap close, he was transition to lantus , meal coverage and SSI./  Chest x ray negative.  Continue with lantus, meal coverage and SSI.  CBG increasing, plan to increase lantus.    2-Right Knee , closed fracture of lateral portion of right tibia plateau.  Open reduction and internal fixation of right tibial plateau fracture by Dr Jena GaussHaddix 5/06 Cardiology has clear patient for surgery.  Weight bearing status /post op DVT prophylaxis /wound care/ pain control per ortho Appreciate ortho input Plan to wean off dilaudid. Will add tramadol, short course.   Vitamin D Deficiency, started  supplement  3-Right LE pain.  Doppler negative for DVT, lymphadenopathy, advised patient to have repeat doppler or follow up on lymphadenopathy.   4-Pseudohyponatremia; hyperglycemia corrected.  Resolved.   5-AKI: Likely related to DKA: Continue baseline 1.2 Presents  with a creatinine of 2.2 Improved with IV fluids for another 24hrs, states poor oral intake Cr down to 1.2  6-Anemia of chronic disease in the setting of heart transplant: Monitor hemoglobin Iron low, started supplement.   7-History of heart transplant: Heart failure/immunosuppressed status  Cardiology  following Continue with prednisone and tacrolimus.   8-Seizure disorder: Resume Vimpat  9-Hypertension: Continue with Norvasc, Cardura On PRN hydralazine.  Started  low dose metoprolol BP improved.   GERD; Continue with PPI.  THC use disorder/history of cannabis hyperemesis syndrome Counseled on THC cessation at bedside.  Oral thrush: Started  Nystatin. improved Hypokalemia;  Replaced.  Hypomagnesemia; replete IV>  Constipation; start miralax. Dulcolax suppository ordered.    Estimated body mass index is 18.27 kg/m as calculated from the following:   Height as of 02/20/21: 5\' 7"  (1.702 m).   Weight as of this encounter: 52.9 kg.   DVT prophylaxis: Lovenox Code Status: Full code Family Communication: Discussed with patient, grandmother at bedside.  Disposition Plan:  Status is: Inpatient  Remains inpatient appropriate because:IV treatments appropriate due to intensity of illness or inability to take PO   Dispo:  Patient From: Home  Planned Disposition: await PT evaluation. Patient wants to go stay with grandmother post discharge  Medically stable for discharge: No CBG elevated. Plan to discharge tomorrow,           Consultants:   Cardiology  Ortho  Procedures:  Open reduction and internal fixation of right tibial plateau fracture by Dr Jena GaussHaddix 5/06  Antimicrobials:  Anti-infectives (From admission, onward)   Start  Dose/Rate Route Frequency Ordered Stop   03/30/21 1630  ceFAZolin (ANCEF) IVPB 2g/100 mL premix        2 g 200 mL/hr over 30 Minutes Intravenous Every 8 hours 03/30/21 1046 03/31/21 0959   03/30/21 1145  ceFAZolin (ANCEF) IVPB 2g/100 mL  premix  Status:  Discontinued        2 g 200 mL/hr over 30 Minutes Intravenous On call to O.R. 03/30/21 1048 03/30/21 1051   03/30/21 0824  vancomycin (VANCOCIN) powder  Status:  Discontinued          As needed 03/30/21 0824 03/30/21 0933   03/29/21 0353  valGANciclovir (VALCYTE) 450 MG tablet TABS 900 mg        900 mg Oral Daily at bedtime 03/28/21 2114         Subjective: He is still having knee pain, oxycodone is not enough.  He is willing to avoid dilaudid, will try tramadol.   Objective: Vitals:   04/02/21 0401 04/02/21 0822 04/02/21 1218 04/02/21 1648  BP: 123/75 (!) 168/80 (!) 146/84 130/80  Pulse: 87 95 96 95  Resp: Temp: 97.9 F (36.6 C) 97.8 F (36.6 C) 98.2 F (36.8 C) 98.2 F (36.8 C)  TempSrc: Oral Oral Oral Oral  SpO2: 97% 95% 97% 100%  Weight: 52.9 kg       Intake/Output Summary (Last 24 hours) at 04/02/2021 1656 Last data filed at 04/02/2021 0500 Gross per 24 hour  Intake --  Output 600 ml  Net -600 ml   Filed Weights   03/30/21 0500 04/01/21 0435 04/02/21 0401  Weight: 52 kg 54.2 kg 52.9 kg    Examination:  General exam: NAD Respiratory system: CTA Cardiovascular system: S 1, S 2 RRR Gastrointestinal system: BS present, soft, nt Central nervous system: Alert Extremities: right knee with dressing.   Data Reviewed: I have personally reviewed following labs and imaging studies  CBC: Recent Labs  Lab 03/29/21 0303 03/29/21 0640 03/30/21 0206 03/30/21 0853 03/31/21 0012 04/01/21 0103 04/02/21 0100  WBC 6.7  --  8.9  --  11.9* 8.6 8.8  HGB 7.7*   < > 8.0* 11.2* 8.3* 7.8* 7.8*  HCT 23.0*   < > 24.3* 33.0* 25.3* 23.6* 23.3*  MCV 69.1*  --  70.0*  --  71.1* 70.7* 70.0*  PLT 435*  --  457*  --  478* 426* 503*   < > = values in this interval not displayed.   Basic Metabolic Panel: Recent Labs  Lab 03/29/21 0303 03/30/21 0206 03/30/21 0853 03/31/21 0012 04/01/21 0103 04/02/21 0100  NA 137 137 136 135 134* 131*  K 3.6 3.2*  3.5 3.1* 3.9 3.9  CL 100 103 101 99 101 97*  CO2 27 26  --  GLUCOSE 156* 136* 213* 88 92 284*  BUN 15 7 5* <5* <5* 5*  CREATININE 1.47* 1.23 0.90 1.12 1.05 1.05  CALCIUM 9.3 8.8*  --  9.0 8.6* 8.7*  MG 2.1  --   --   --  1.4* 1.6*  PHOS 3.4  --   --   --   --   --    GFR: Estimated Creatinine Clearance: 81.9 mL/min (by C-G formula based on SCr of 1.05 mg/dL). Liver Function Tests: Recent Labs  Lab 03/28/21 1636  AST 53*  ALT 55*  ALKPHOS 141*  BILITOT 1.1  PROT 7.8  ALBUMIN 3.8   Recent Labs  Lab 03/28/21 1636  LIPASE  31   No results for input(s): AMMONIA in the last 168 hours. Coagulation Profile: No results for input(s): INR, PROTIME in the last 168 hours. Cardiac Enzymes: No results for input(s): CKTOTAL, CKMB, CKMBINDEX, TROPONINI in the last 168 hours. BNP (last 3 results) No results for input(s): PROBNP in the last 8760 hours. HbA1C: No results for input(s): HGBA1C in the last 72 hours. CBG: Recent Labs  Lab 04/01/21 2045 04/01/21 2113 04/01/21 2359 04/02/21 0825 04/02/21 1220  GLUCAP 58* 110* 260* 330* 273*   Lipid Profile: No results for input(s): CHOL, HDL, LDLCALC, TRIG, CHOLHDL, LDLDIRECT in the last 72 hours. Thyroid Function Tests: No results for input(s): TSH, T4TOTAL, FREET4, T3FREE, THYROIDAB in the last 72 hours. Anemia Panel: No results for input(s): VITAMINB12, FOLATE, FERRITIN, TIBC, IRON, RETICCTPCT in the last 72 hours. Sepsis Labs: No results for input(s): PROCALCITON, LATICACIDVEN in the last 168 hours.  Recent Results (from the past 240 hour(s))  SARS CORONAVIRUS 2 (TAT 6-24 HRS) Nasopharyngeal Nasopharyngeal Swab     Status: None   Collection Time: 03/28/21 12:10 PM   Specimen: Nasopharyngeal Swab  Result Value Ref Range Status   SARS Coronavirus 2 NEGATIVE NEGATIVE Final    Comment: (NOTE) SARS-CoV-2 target nucleic acids are NOT DETECTED.  The SARS-CoV-2 RNA is generally detectable in upper and lower respiratory  specimens during the acute phase of infection. Negative results do not preclude SARS-CoV-2 infection, do not rule out co-infections with other pathogens, and should not be used as the sole basis for treatment or other patient management decisions. Negative results must be combined with clinical observations, patient history, and epidemiological information. The expected result is Negative.  Fact Sheet for Patients: HairSlick.no  Fact Sheet for Healthcare Providers: quierodirigir.com  This test is not yet approved or cleared by the [place] FDA and  has been authorized for detection and/or diagnosis of SARS-CoV-2 by FDA under an Emergency Use Authorization (EUA). This EUA will remain  in effect (meaning this test can be used) for the duration of the COVID-19 declaration under Se ction 564(b)(1) of the Act, 21 U.S.C. section 360bbb-3(b)(1), unless the authorization is terminated or revoked sooner.  Performed at [facility], [address], [place], [place] [address]   MRSA PCR Screening     Status: None   Collection Time: 03/29/21  3:54 PM   Specimen: Nasal Mucosa; Nasopharyngeal  Result Value Ref Range Status   MRSA by PCR NEGATIVE NEGATIVE Final    Comment:        The GeneXpert MRSA Assay (FDA approved for NASAL specimens only), is one component of a comprehensive MRSA colonization surveillance program. It is not intended to diagnose MRSA infection nor to guide or monitor treatment for MRSA infections. Performed at [facility], [address] [place], [place] [address]          Radiology Studies: VAS [place] LOWER EXTREMITY VENOUS (DVT)  Result Date: 04/02/2021  Lower Venous DVT Study Patient Name:  TRELON PLUSH  Date of Exam:   04/02/2021 Medical Rec #: [id number]      Accession #:    [id number] Date of Birth: 12-Feb-1996       Patient Gender: M Patient Age:   24Y Exam Location:  [facility]  Procedure:      VAS [place] LOWER EXTREMITY VENOUS (DVT) Referring Phys: [id number] [facility] A YACOBI --------------------------------------------------------------------------------  Indications: Pain. Other Indications: Tibial plateau fracture with ORIF. Limitations: Bandages, orthopaedic appliance and pain with any pressure.  Comparison Study: Prior negative bilateral lower extremity venous duplex done                   01/05/2016 Performing Technologist: Sherren Kerns RVS  Examination Guidelines: A complete evaluation includes B-mode imaging, spectral Doppler, color Doppler, and power Doppler as needed of all accessible portions of each vessel. Bilateral testing is considered an integral part of a complete examination. Limited examinations for reoccurring indications may be performed as noted. The reflux portion of the exam is performed with the patient in reverse Trendelenburg.  +---------+---------------+---------+-----------+----------+-------------------+ RIGHT    CompressibilityPhasicitySpontaneityPropertiesThrombus Aging      +---------+---------------+---------+-----------+----------+-------------------+ CFV                     Yes      Yes                  patent with color                                                         and Doppler         +---------+---------------+---------+-----------+----------+-------------------+ FV Prox                                               patent with color                                                         and Doppler         +---------+---------------+---------+-----------+----------+-------------------+ FV Mid                                                patent with color                                                         and Doppler         +---------+---------------+---------+-----------+----------+-------------------+ FV Distal                                             patent with color                                                          and Doppler         +---------+---------------+---------+-----------+----------+-------------------+ PFV  patent with color                                                         and Doppler         +---------+---------------+---------+-----------+----------+-------------------+ POP                                                   Not well visualized +---------+---------------+---------+-----------+----------+-------------------+ PTV                                                   Not well visualized +---------+---------------+---------+-----------+----------+-------------------+ PERO                                                  Not well visualized +---------+---------------+---------+-----------+----------+-------------------+   +----+---------------+---------+-----------+----------+-----------------------+ LEFTCompressibilityPhasicitySpontaneityPropertiesThrombus Aging          +----+---------------+---------+-----------+----------+-----------------------+ CFV                Yes      Yes                  patent with color and                                                    Doppler                 +----+---------------+---------+-----------+----------+-----------------------+     Summary: RIGHT: - There is no evidence of deep vein thrombosis in the lower extremity. However, portions of this examination were limited- see technologist comments above.  - Ultrasound characteristics of enlarged lymph nodes are noted in the groin.  LEFT: - No evidence of common femoral vein obstruction.  *See table(s) above for measurements and observations. Electronically signed by Lemar Livings MD on 04/02/2021 at 2:57:55 PM.    Final         Scheduled Meds: . amLODipine  10 mg Oral Daily  . bisacodyl  10 mg Rectal Once  . cholecalciferol  1,000 Units Oral Daily  .  docusate sodium  100 mg Oral BID  . doxazosin  1 mg Oral QHS  . enoxaparin (LOVENOX) injection  40 mg Subcutaneous Daily  . ferrous sulfate  325 mg Oral Q breakfast  . insulin aspart  0-5 Units Subcutaneous QHS  . insulin aspart  0-9 Units Subcutaneous TID WC  . insulin aspart  4 Units Subcutaneous TID WC  . [START ON 04/03/2021] insulin glargine  14 Units Subcutaneous Daily  . insulin glargine  2 Units Subcutaneous Once  . lacosamide  50 mg Oral BID  . metoprolol tartrate  12.5 mg Oral BID  . nystatin  5 mL Oral QID  . pantoprazole  40 mg Oral BID  . polyethylene glycol  17 g Oral  BID  . pravastatin  40 mg Oral QHS  . predniSONE  2.5 mg Oral Q breakfast  . senna-docusate  2 tablet Oral BID  . sertraline  50 mg Oral Daily  . Tacrolimus ER  8 mg Oral Q breakfast   And  . Tacrolimus ER  2 mg Oral Q breakfast  . valGANciclovir  900 mg Oral QHS   Continuous Infusions: . methocarbamol (ROBAXIN) IV       LOS: 5 days    Time spent: 25 minutes    Alba Cory, MD PhD FACP [facility]   If 7PM-7AM, please contact night-coverage [email or web address]  04/02/2021, 4:56 PM

## 2021-04-02 NOTE — Progress Notes (Signed)
VASCULAR LAB    Right lower extremity venous duplex has been performed.  See CV proc for preliminary results.   Kirrah Mustin, RVT 04/02/2021, 11:15 AM

## 2021-04-03 ENCOUNTER — Other Ambulatory Visit (HOSPITAL_BASED_OUTPATIENT_CLINIC_OR_DEPARTMENT_OTHER): Payer: Self-pay

## 2021-04-03 DIAGNOSIS — S82121A Displaced fracture of lateral condyle of right tibia, initial encounter for closed fracture: Secondary | ICD-10-CM | POA: Diagnosis not present

## 2021-04-03 LAB — GLUCOSE, CAPILLARY
Glucose-Capillary: 261 mg/dL — ABNORMAL HIGH (ref 70–99)
Glucose-Capillary: 269 mg/dL — ABNORMAL HIGH (ref 70–99)
Glucose-Capillary: 144 mg/dL — ABNORMAL HIGH (ref 70–99)

## 2021-04-03 MED ORDER — MAGNESIUM OXIDE -MG SUPPLEMENT 400 (240 MG) MG PO TABS
200.0000 mg | ORAL_TABLET | Freq: Two times a day (BID) | ORAL | Status: DC
  Administered 2021-04-03: 200 mg via ORAL
  Filled 2021-04-03: qty 1

## 2021-04-03 MED ORDER — VALGANCICLOVIR HCL 450 MG PO TABS: 900.0000 mg | ORAL_TABLET | Freq: Every day | ORAL | 0 refills | Status: AC

## 2021-04-03 MED ORDER — POLYETHYLENE GLYCOL 3350 17 G PO PACK: 17.0000 g | PACK | Freq: Two times a day (BID) | ORAL | 0 refills | Status: AC

## 2021-04-03 MED ORDER — TRAMADOL HCL 50 MG PO TABS: 50.0000 mg | ORAL_TABLET | Freq: Three times a day (TID) | ORAL | 0 refills | Status: AC | PRN

## 2021-04-03 MED ORDER — HYDROMORPHONE HCL 1 MG/ML IJ SOLN: 1.0000 mg | INTRAMUSCULAR | Status: DC | PRN

## 2021-04-03 MED ORDER — METOPROLOL TARTRATE 25 MG PO TABS: 12.5000 mg | ORAL_TABLET | Freq: Two times a day (BID) | ORAL | 0 refills | Status: AC

## 2021-04-03 MED ORDER — SENNOSIDES-DOCUSATE SODIUM 8.6-50 MG PO TABS: 2.0000 | ORAL_TABLET | Freq: Two times a day (BID) | ORAL | 0 refills | Status: AC

## 2021-04-03 MED ORDER — MAGNESIUM SULFATE 2 GM/50ML IV SOLN
2.0000 g | Freq: Once | INTRAVENOUS | Status: AC
  Administered 2021-04-03: 2 g via INTRAVENOUS
  Filled 2021-04-03: qty 50

## 2021-04-03 MED ORDER — FERROUS SULFATE 325 (65 FE) MG PO TABS: 325.0000 mg | ORAL_TABLET | Freq: Every day | ORAL | 0 refills | Status: AC

## 2021-04-03 MED ORDER — ENVARSUS XR 4 MG PO TB24: 8.0000 mg | ORAL_TABLET | Freq: Every day | ORAL | 0 refills | Status: AC

## 2021-04-03 MED ORDER — NYSTATIN 100000 UNIT/ML MT SUSP: 5.0000 mL | Freq: Four times a day (QID) | OROMUCOSAL | 0 refills | Status: AC

## 2021-04-03 MED ORDER — TACROLIMUS ER 1 MG PO TB24: 2.0000 mg | ORAL_TABLET | Freq: Every day | ORAL | 0 refills | Status: AC

## 2021-04-03 MED ORDER — MAGNESIUM OXIDE -MG SUPPLEMENT 400 (240 MG) MG PO TABS: 200.0000 mg | ORAL_TABLET | Freq: Two times a day (BID) | ORAL | 0 refills | Status: AC

## 2021-04-03 NOTE — Progress Notes (Signed)
Physical Therapy Treatment Patient Details Name: Manuel Baker MRN: [id number] DOB: 1996-03-15 Today's Date: 04/03/2021    History of Present Illness 25 y.o. male presents to [facility] ED on 5/4 with reports of nausea/vomiting, polyuria, abdominal cramping, and R knee pain s/p fall. 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 PT also with R tibial plateau fx. Pt underwent RLE ORIF on 03/30/2021.    PT Comments    Patient received in bed, willing to participate in PT this morning. Able to mobilize well and maintain NWB well R LE with RW on a min guard basis- slightly unsteady when turning, but able to maintain balance with only min guard. Family educated on guarding strategies, reviewed gait belt use and car transfers. Left in bed with all needs met, family present. Progressing well despite ongoing elevated pain levels.     Follow Up Recommendations  Home health PT     Equipment Recommendations  Wheelchair (measurements PT);Wheelchair cushion (measurements PT);Rolling walker with 5" wheels    Recommendations for Other Services       Precautions / Restrictions Precautions Precautions: Fall Precaution Comments: R LE NWB with KI Required Braces or Orthoses: Knee Immobilizer - Right Restrictions Weight Bearing Restrictions: Yes RLE Weight Bearing: Non weight bearing Other Position/Activity Restrictions: OK to do R knee ROM per ortho    Mobility  Bed Mobility Overal bed mobility: Modified Independent Bed Mobility: Supine to Sit;Sit to Supine     Supine to sit: HOB elevated;Modified independent (Device/Increase time) Sit to supine: Modified independent (Device/Increase time);HOB elevated   General bed mobility comments: able to use L LE to manage RLE during bed mobility without assist or difficulty    Transfers Overall transfer level: Needs assistance Equipment used: Rolling walker (2 wheeled) Transfers: Sit to/from Stand Sit to Stand: Min guard         General transfer comment: min guard, able to  maintain NWB R LE well when transferring from various surfaces  Ambulation/Gait Ambulation/Gait assistance: Min guard Gait Distance (Feet): 24 Feet (65fx2) Assistive device: Rolling walker (2 wheeled) Gait Pattern/deviations:  (hop to pattern, NWB R LE) Gait velocity: decreased   General Gait Details: min guard, mild unsteadiness on turns but able to maintain balance with min guard   Stairs             Wheelchair Mobility    Modified Rankin (Stroke Patients Only)       Balance Overall balance assessment: Needs assistance Sitting-balance support: Single extremity supported;Bilateral upper extremity supported;No upper extremity supported Sitting balance-Leahy Scale: Normal     Standing balance support: Bilateral upper extremity supported;During functional activity Standing balance-Leahy Scale: Poor Standing balance comment: reliant on BUE support                            Cognition Arousal/Alertness: Awake/alert Behavior During Therapy: Flat affect Overall Cognitive Status: Within Functional Limits for tasks assessed                                 General Comments: pleasant, cooperative      Exercises      General Comments        Pertinent Vitals/Pain Pain Assessment: 0-10 Pain Score: 9  Pain Location: RLE Pain Descriptors / Indicators: Grimacing;Discomfort;Operative site guarding Pain Intervention(s): Monitored during session;Patient requesting pain meds-RN notified    Home Living  Prior Function            PT Goals (current goals can now be found in the care plan section) Acute Rehab PT Goals Patient Stated Goal: To manage pain and return home. PT Goal Formulation: With patient Time For Goal Achievement: 04/14/21 Potential to Achieve Goals: Good Progress towards PT goals: Progressing toward goals    Frequency    Min 5X/week      PT Plan Current plan remains appropriate     Co-evaluation              AM-PAC PT "6 Clicks" Mobility   Outcome Measure  Help needed turning from your back to your side while in a flat bed without using bedrails?: None Help needed moving from lying on your back to sitting on the side of a flat bed without using bedrails?: None Help needed moving to and from a bed to a chair (including a wheelchair)?: A Little Help needed standing up from a chair using your arms (e.g., wheelchair or bedside chair)?: A Little Help needed to walk in hospital room?: A Little Help needed climbing 3-5 steps with a railing? : A Lot 6 Click Score: 19    End of Session Equipment Utilized During Treatment: Gait belt Activity Tolerance: Patient tolerated treatment well Patient left: in bed;with call bell/phone within reach;with family/visitor present Nurse Communication: Mobility status PT Visit Diagnosis: Muscle weakness (generalized) (M62.81);Difficulty in walking, not elsewhere classified (R26.2);Pain Pain - Right/Left: Right Pain - part of body: Knee     Time: 2876-8115 PT Time Calculation (min) (ACUTE ONLY): 10 min  Charges:  $Gait Training: 8-22 mins                     Windell Norfolk, DPT, PN1   Supplemental Physical Therapist [place]    Pager [phone] Acute Rehab Office [phone]

## 2021-04-03 NOTE — TOC Transition Note (Signed)
Transition of Care [facility]) - CM/SW Discharge Note   Patient Details  Name: Bhavik Cabiness MRN: [id number] Date of Birth: Jan 30, 1996  Transition of Care [facility] CM/SW Contact:  Lawerance Sabal, RN Phone Number: 04/03/2021, 10:30 AM   Clinical Narrative:    Grandma at bedside states they are goingto her house in [place] from the hospital.  [facility] and RW ordered. She understands that they may only be able to get [facility] and will get RW at store out of pocket if necessary. HH cannot be establish out of state with payor source    Final next level of care: Home/Self Care Barriers to Discharge: No Barriers Identified   Patient Goals and CMS Choice   CMS Medicare.gov Compare Post Acute Care list provided to:: Other (Comment Required) Choice offered to / list presented to :  (grandma at bedside)  Discharge Placement                       Discharge Plan and Services   Discharge Planning Services: CM Consult            DME Arranged: Lightweight manual wheelchair with seat cushion DME Agency: [employer] Date DME Agency Contacted: 04/03/21 Time DME Agency Contacted: 1030 Representative spoke with at DME Agency: Maud Deed            Social Determinants of Health (SDOH) Interventions     Readmission Risk Interventions No flowsheet data found.

## 2021-04-03 NOTE — Progress Notes (Signed)
Pt's WC arrived. Pt is leaving with walker and wheelchair. His grandmother is here to pick him up and they are driving to [place], [place]. I gave him pain medication prior to leaving. Pt will be traveling by car.

## 2021-04-03 NOTE — Discharge Summary (Addendum)
Physician Discharge Summary  Manuel Baker EPP:[id number] DOB: 1995/11/27 DOA: 03/28/2021  PCP: Demaris Callander [facility]  Admit date: 03/28/2021 Discharge date: 04/03/2021  Admitted From: Home  Disposition:  Home   Recommendations for Outpatient Follow-up:  1. Follow up with PCP in 1-2 weeks 2. Please obtain BMP/CBC in one week 3. Needs to follow up with Dr Jena Gauss 5/17  Home Health: yes.   Discharge Condition: Stable.  CODE STATUS: Full code Diet recommendation: Carb Modified   Brief/Interim Summary: 25 year old with past medical history significant for type I diabetic, gastroparesis, Hx Epstein anomaly S/P heart transplant at [facility], hypertension, GERD, chronic anxiety depression, THC use and cannabis hyperemesis syndrome, seizure disorder, history of CVA who presents to [facility], ED via EMS due to nausea vomiting polyuria abdominal pain 1 day duration.  Patient has been having pain in his right knee after a fall 6-day prior to admission.  He supposed to have surgery with Dr. Jena Gauss on 5/5.  The morning of admission he wake up with nausea vomiting.  Evaluation in the ED patient was found to be in DKA bicarb 19, anion gap 18, pH 7.5, mild transaminases.   1-DKA type I: Patient was started on IV fluids insulin Gtt.  Gap close, he was transition to lantus , meal coverage and SSI./  Chest x ray negative.  Continue with lantus, meal coverage and SSI.  CBG increasing, plan to increase lantus.    2-Right Knee , closed fracture of lateral portion of right tibia plateau.  Open reduction and internal fixation of right tibial plateau fracture by Dr Jena Gauss 5/06 Cardiology has clear patient for surgery.  Weight bearing status /post op DVT prophylaxis /wound care/ pain control per ortho Appreciate ortho input Added tramadol for 3 days course.  Needs follow up in 1 week.   Vitamin D Deficiency, started  supplement  3-Right LE pain.  Doppler negative for DVT, lymphadenopathy,  advised patient to have repeat doppler or follow up on lymphadenopathy.   4-Pseudohyponatremia; hyperglycemia corrected.  Resolved.   5-AKI: Likely related to DKA: Continue baseline 1.2 Presents with a creatinine of 2.2 Improved with IV fluids for another 24hrs, states poor oral intake Cr down to 1.0  6-Anemia of chronic disease in the setting of heart transplant: Monitor hemoglobin Iron low, started supplement.  Hb stable.   7-History of heart transplant: Heart failure/immunosuppressed status  Cardiology  following Continue with prednisone and tacrolimus.  He will need tacrolimus level in 1 week.  His dose was increase to 10 mg.   8-Seizure disorder: Resume Vimpat  9-Hypertension: Continue with Norvasc, Cardura On PRN hydralazine.  Started  low dose metoprolol BP improved.   GERD; Continue with PPI.  THC use disorder/history of cannabis hyperemesis syndrome Counseled on THC cessation at bedside.  Oral thrush: Started  Nystatin. improved Hypokalemia;  Replaced.  Hypomagnesemia; replete IV> discharge on oral magnesium  Constipation; on miralax. Dulcolax suppository ordered. no BM, decline suppository today.  He think he will have BM when he get home  Severe protein Malnutrition;     Discharge Diagnoses:  Principal Problem:   Closed fracture of lateral portion of right tibial plateau Active Problems:   DKA, type 1 (HCC)   Closed fracture of right tibial plateau    Discharge Instructions  Discharge Instructions    Diet - low sodium heart healthy   Complete by: As directed    Increase activity slowly   Complete by: As directed    No wound care  Complete by: As directed      Allergies as of 04/03/2021      Reactions   Ibuprofen    Told not to take ibuprofen after cardiac surgery   Nsaids Other (See Comments)   Unknown reaction      Medication List    STOP taking these medications   anidulafungin in sodium chloride 0.9 % 50 mL   aspirin EC  81 MG tablet   diclofenac Sodium 1 % Gel Commonly known as: VOLTAREN   metoCLOPramide 5 MG tablet Commonly known as: REGLAN   metoCLOPramide 5 MG/ML injection Commonly known as: REGLAN   ondansetron 4 MG disintegrating tablet Commonly known as: ZOFRAN-ODT   polyethylene glycol powder 17 GM/SCOOP powder Commonly known as: GLYCOLAX/MIRALAX Replaced by: polyethylene glycol 17 g packet   sodium chloride 0.9 % SOLN 50 mL with promethazine 25 MG/ML SOLN 12.5 mg     TAKE these medications   acetaminophen 500 MG tablet Commonly known as: TYLENOL Take 500 mg by mouth every 6 (six) hours as needed for mild pain.   amLODipine 10 MG tablet Commonly known as: NORVASC Take 10 mg by mouth in the morning.   Apidra SoloStar 100 UNIT/ML Solostar Pen Generic drug: insulin glulisine Inject 0-45 Units into the skin See admin instructions. Sliding Scale Insulin   Baqsimi One Pack 3 MG/DOSE Powd Generic drug: Glucagon Place 1 spray into the nose as needed (low blood sugar).   doxazosin 2 MG tablet Commonly known as: CARDURA Take 1 mg by mouth at bedtime.   enoxaparin 40 MG/0.4ML injection Commonly known as: LOVENOX Inject 0.4 mLs (40 mg total) into the skin daily for 28 days.   Envarsus XR 4 MG Tb24 Generic drug: Tacrolimus ER Take 8 mg by mouth daily with breakfast. What changed: when to take this   Tacrolimus ER 1 MG Tb24 Take 2 mg by mouth daily with breakfast. What changed: You were already taking a medication with the same name, and this prescription was added. Make sure you understand how and when to take each.   ferrous sulfate 325 (65 FE) MG tablet Take 1 tablet (325 mg total) by mouth daily with breakfast. Start taking on: Apr 04, 2021   INSULIN SYRINGE .3CC/31GX5/16" 31G X 5/16" 0.3 ML Misc 1 Act by Does not apply route 4 (four) times daily -  before meals and at bedtime.   magnesium oxide 400 (240 Mg) MG tablet Commonly known as: MAG-OX Take 0.5 tablets (200 mg  total) by mouth 2 (two) times daily.   methocarbamol 500 MG tablet Commonly known as: ROBAXIN Take 1 tablet (500 mg total) by mouth every 6 (six) hours as needed for muscle spasms.   metoprolol tartrate 25 MG tablet Commonly known as: LOPRESSOR Take 0.5 tablets (12.5 mg total) by mouth 2 (two) times daily.   nystatin 100000 UNIT/ML suspension Commonly known as: MYCOSTATIN Take 5 mLs (500,000 Units total) by mouth 4 (four) times daily.   Oxycodone HCl 10 MG Tabs Take 0.5-1 tablets (5-10 mg total) by mouth every 4 (four) hours as needed for moderate pain, severe pain or breakthrough pain.   pantoprazole 40 MG tablet Commonly known as: PROTONIX Take 40 mg by mouth in the morning and at bedtime. What changed: Another medication with the same name was removed. Continue taking this medication, and follow the directions you see here.   polyethylene glycol 17 g packet Commonly known as: MIRALAX / GLYCOLAX Take 17 g by mouth 2 (two) times daily.  Replaces: polyethylene glycol powder 17 GM/SCOOP powder   pravastatin 40 MG tablet Commonly known as: PRAVACHOL Take 40 mg by mouth at bedtime.   predniSONE 5 MG tablet Commonly known as: DELTASONE Take 2.5 mg by mouth in the morning.   senna-docusate 8.6-50 MG tablet Commonly known as: Senokot-S Take 2 tablets by mouth 2 (two) times daily. What changed:   when to take this  reasons to take this   sertraline 50 MG tablet Commonly known as: ZOLOFT Take 50 mg by mouth in the morning.   traMADol 50 MG tablet Commonly known as: ULTRAM Take 1 tablet (50 mg total) by mouth every 8 (eight) hours as needed for up to 2 days for moderate pain.   traZODone 50 MG tablet Commonly known as: DESYREL Take 25 mg by mouth at bedtime as needed for sleep.   Evaristo Bury FlexTouch 100 UNIT/ML FlexTouch Pen Generic drug: insulin degludec Inject 14 Units into the skin at bedtime.   valGANciclovir 450 MG tablet Commonly known as: VALCYTE Take 2  tablets (900 mg total) by mouth at bedtime.   Vimpat 50 MG Tabs tablet Generic drug: lacosamide Take 50 mg by mouth 2 (two) times daily.   Vitamin D3 25 MCG tablet Commonly known as: Vitamin D Take 1 tablet (1,000 Units total) by mouth daily.            Durable Medical Equipment  (From admission, onward)         Start     Ordered   04/02/21 0932  For home use only DME 3 n 1  Once        04/02/21 0932   04/02/21 0932  For home use only DME Tub bench  Once        04/02/21 0932   04/02/21 0932  For home use only DME Walker rolling  Once       Question Answer Comment  Walker: With 5 Inch Wheels   Patient needs a walker to treat with the following condition Closed fracture of right tibial plateau      04/02/21 0932          Follow-up Information    Haddix, Gillie Manners, MD. Go on 04/10/2021.   Specialty: Orthopedic Surgery Why: on 04/10/2021 at 1:30PM Contact information: [address] [place] [address] [phone]              Allergies  Allergen Reactions  . Ibuprofen     Told not to take ibuprofen after cardiac surgery  . Nsaids Other (See Comments)    Unknown reaction    Consultations:  Ortho  Cardiology    Procedures/Studies: DG Chest 2 View  Result Date: 03/29/2021 CLINICAL DATA:  Nausea/vomiting EXAM: CHEST - 2 VIEW COMPARISON:  Radiograph 02/19/2021, chest CT 12/02/2020 FINDINGS: Unchanged cardiomediastinal silhouette. There is no new focal airspace disease. There is no pleural effusion or pneumothorax. There is no acute osseous abnormality. IMPRESSION: No evidence of acute cardiopulmonary disease. Electronically Signed   By: Caprice Renshaw   On: 03/29/2021 09:42   CT KNEE RIGHT WO CONTRAST  Result Date: 03/28/2021 CLINICAL DATA:  Right knee pain when walking.  Fall 2 weeks ago. EXAM: CT OF THE right KNEE WITHOUT CONTRAST TECHNIQUE: Multidetector CT imaging of the right knee was performed according to the standard protocol. Multiplanar CT image  reconstructions were also generated. COMPARISON:  None. FINDINGS: Bones/Joint/Cartilage Relatively nondisplaced four part fracture of the tibial plateau with intersecting fracture planes in the vicinity of  the tibial spine and articular surface of the lateral tibial plateau and extending to the medial and lateral metaphysis. Mildly comminuted primarily oblique fracture of the proximal fibular head. Small lipohemarthrosis. Ligaments Suboptimally assessed by CT. Muscles and Tendons Unremarkable Soft tissues Low-level edema tracks in the popliteal space and in the regional subcutaneous tissues. IMPRESSION: 1. Relatively nondisplaced 4 part fracture of the tibial plateau (type V in the [employer]). Mildly comminuted proximal fibular head fracture. 2. Infiltrative edema in the popliteal space and subcutaneous tissues. Electronically Signed   By: Gaylyn Rong M.D.   On: 03/28/2021 11:57   DG Knee Complete 4 Views Right  Result Date: 03/30/2021 CLINICAL DATA:  ORIF, tibial plateau fracture EXAM: RIGHT KNEE - COMPLETE 4+ VIEW; DG C-ARM 1-60 MIN COMPARISON:  03/30/2021 FLUOROSCOPY TIME:  1:11 FINDINGS: Intraoperative fluoroscopic images and postoperative radiographs demonstrate plate and screw fixation of the lateral tibial plateau. Persistent lipohemarthrosis. IMPRESSION: ORIF of the lateral tibial plateau. Electronically Signed   By: Lauralyn Primes M.D.   On: 03/30/2021 12:00   DG Knee Right Port  Result Date: 03/30/2021 CLINICAL DATA:  Postop from repair right proximal tibia fracture. EXAM: PORTABLE RIGHT KNEE - 1-2 VIEW COMPARISON:  Right knee CT, 03/28/2021. FINDINGS: Lateral tibial plateau fracture has been reduced with a lateral fixation plate and fixation screws. The orthopedic hardware is well seated. The fracture is well aligned. The articular surface of the lateral tibial plateau appears congruent. No evidence of an operative complication. IMPRESSION: Well aligned proximal tibial  fracture following ORIF. Electronically Signed   By: Amie Portland M.D.   On: 03/30/2021 15:06   DG C-Arm 1-60 Min  Result Date: 03/30/2021 CLINICAL DATA:  ORIF, tibial plateau fracture EXAM: RIGHT KNEE - COMPLETE 4+ VIEW; DG C-ARM 1-60 MIN COMPARISON:  03/30/2021 FLUOROSCOPY TIME:  1:11 FINDINGS: Intraoperative fluoroscopic images and postoperative radiographs demonstrate plate and screw fixation of the lateral tibial plateau. Persistent lipohemarthrosis. IMPRESSION: ORIF of the lateral tibial plateau. Electronically Signed   By: Lauralyn Primes M.D.   On: 03/30/2021 12:00   VAS [place] LOWER EXTREMITY VENOUS (DVT)  Result Date: 04/02/2021  Lower Venous DVT Study Patient Name:  ELUTERIO SEYMOUR  Date of Exam:   04/02/2021 Medical Rec #: [id number]      Accession #:    [id number] Date of Birth: 19-Oct-1996       Patient Gender: M Patient Age:   24Y Exam Location:  [facility] Procedure:      VAS [place] LOWER EXTREMITY VENOUS (DVT) Referring Phys: [id number] [facility] A [place] --------------------------------------------------------------------------------  Indications: Pain. Other Indications: Tibial plateau fracture with ORIF. Limitations: Bandages, orthopaedic appliance and pain with any pressure. Comparison Study: Prior negative bilateral lower extremity venous duplex done                   01/05/2016 Performing Technologist: Sherren Kerns RVS  Examination Guidelines: A complete evaluation includes B-mode imaging, spectral Doppler, color Doppler, and power Doppler as needed of all accessible portions of each vessel. Bilateral testing is considered an integral part of a complete examination. Limited examinations for reoccurring indications may be performed as noted. The reflux portion of the exam is performed with the patient in reverse Trendelenburg.  +---------+---------------+---------+-----------+----------+-------------------+ RIGHT    CompressibilityPhasicitySpontaneityPropertiesThrombus Aging       +---------+---------------+---------+-----------+----------+-------------------+ CFV                     Yes      Yes  patent with color                                                         and Doppler         +---------+---------------+---------+-----------+----------+-------------------+ FV Prox                                               patent with color                                                         and Doppler         +---------+---------------+---------+-----------+----------+-------------------+ FV Mid                                                patent with color                                                         and Doppler         +---------+---------------+---------+-----------+----------+-------------------+ FV Distal                                             patent with color                                                         and Doppler         +---------+---------------+---------+-----------+----------+-------------------+ PFV                                                   patent with color                                                         and Doppler         +---------+---------------+---------+-----------+----------+-------------------+ POP                                                   Not well visualized +---------+---------------+---------+-----------+----------+-------------------+ PTV  Not well visualized +---------+---------------+---------+-----------+----------+-------------------+ PERO                                                  Not well visualized +---------+---------------+---------+-----------+----------+-------------------+   +----+---------------+---------+-----------+----------+-----------------------+ LEFTCompressibilityPhasicitySpontaneityPropertiesThrombus Aging           +----+---------------+---------+-----------+----------+-----------------------+ CFV                Yes      Yes                  patent with color and                                                    Doppler                 +----+---------------+---------+-----------+----------+-----------------------+     Summary: RIGHT: - There is no evidence of deep vein thrombosis in the lower extremity. However, portions of this examination were limited- see technologist comments above.  - Ultrasound characteristics of enlarged lymph nodes are noted in the groin.  LEFT: - No evidence of common femoral vein obstruction.  *See table(s) above for measurements and observations. Electronically signed by Lemar Livings MD on 04/02/2021 at 2:57:55 PM.    Final       Subjective: He has not had BM Report knee pain   Discharge Exam: Vitals:   04/03/21 0403 04/03/21 0738  BP:  (!) 157/86  Pulse:  98  Resp: 18 18  Temp: 98.2 F (36.8 C) 98.6 F (37 C)  SpO2:  99%     General: Pt is alert, awake, not in acute distress Cardiovascular: RRR, S1/S2 +, no rubs, no gallops Respiratory: CTA bilaterally, no wheezing, no rhonchi Abdominal: Soft, NT, ND, bowel sounds + Extremities: no edema, no cyanosis, knee with dressing     The results of significant diagnostics from this hospitalization (including imaging, microbiology, ancillary and laboratory) are listed below for reference.     Microbiology: Recent Results (from the past 240 hour(s))  SARS CORONAVIRUS 2 (TAT 6-24 HRS) Nasopharyngeal Nasopharyngeal Swab     Status: None   Collection Time: 03/28/21 12:10 PM   Specimen: Nasopharyngeal Swab  Result Value Ref Range Status   SARS Coronavirus 2 NEGATIVE NEGATIVE Final    Comment: (NOTE) SARS-CoV-2 target nucleic acids are NOT DETECTED.  The SARS-CoV-2 RNA is generally detectable in upper and lower respiratory specimens during the acute phase of infection. Negative results do not preclude  SARS-CoV-2 infection, do not rule out co-infections with other pathogens, and should not be used as the sole basis for treatment or other patient management decisions. Negative results must be combined with clinical observations, patient history, and epidemiological information. The expected result is Negative.  Fact Sheet for Patients: HairSlick.no  Fact Sheet for Healthcare Providers: quierodirigir.com  This test is not yet approved or cleared by the [place] FDA and  has been authorized for detection and/or diagnosis of SARS-CoV-2 by FDA under an Emergency Use Authorization (EUA). This EUA will remain  in effect (meaning this test can be used) for the duration of the COVID-19 declaration under Se ction 564(b)(1) of the Act, 21 U.S.C. section 360bbb-3(b)(1), unless the authorization is terminated or  revoked sooner.  Performed at [facility], [address], [place], [place] [address]   MRSA PCR Screening     Status: None   Collection Time: 03/29/21  3:54 PM   Specimen: Nasal Mucosa; Nasopharyngeal  Result Value Ref Range Status   MRSA by PCR NEGATIVE NEGATIVE Final    Comment:        The GeneXpert MRSA Assay (FDA approved for NASAL specimens only), is one component of a comprehensive MRSA colonization surveillance program. It is not intended to diagnose MRSA infection nor to guide or monitor treatment for MRSA infections. Performed at [facility], [address] [place], [place] [address]      Labs: BNP (last 3 results) Recent Labs    10/13/20 1100 12/02/20 2219  BNP 93.7 137.0*   Basic Metabolic Panel: Recent Labs  Lab 03/29/21 0303 03/30/21 0206 03/30/21 0853 03/31/21 0012 04/01/21 0103 04/02/21 0100  NA 137 137 136 135 134* 131*  K 3.6 3.2* 3.5 3.1* 3.9 3.9  CL 100 103 101 99 101 97*  CO2 27 26  --  GLUCOSE 156* 136* 213* 88 92 284*  BUN 15 7 5* <5* <5* 5*   CREATININE 1.47* 1.23 0.90 1.12 1.05 1.05  CALCIUM 9.3 8.8*  --  9.0 8.6* 8.7*  MG 2.1  --   --   --  1.4* 1.6*  PHOS 3.4  --   --   --   --   --    Liver Function Tests: Recent Labs  Lab 03/28/21 1636  AST 53*  ALT 55*  ALKPHOS 141*  BILITOT 1.1  PROT 7.8  ALBUMIN 3.8   Recent Labs  Lab 03/28/21 1636  LIPASE 31   No results for input(s): AMMONIA in the last 168 hours. CBC: Recent Labs  Lab 03/29/21 0303 03/29/21 0640 03/30/21 0206 03/30/21 0853 03/31/21 0012 04/01/21 0103 04/02/21 0100  WBC 6.7  --  8.9  --  11.9* 8.6 8.8  HGB 7.7*   < > 8.0* 11.2* 8.3* 7.8* 7.8*  HCT 23.0*   < > 24.3* 33.0* 25.3* 23.6* 23.3*  MCV 69.1*  --  70.0*  --  71.1* 70.7* 70.0*  PLT 435*  --  457*  --  478* 426* 503*   < > = values in this interval not displayed.   Cardiac Enzymes: No results for input(s): CKTOTAL, CKMB, CKMBINDEX, TROPONINI in the last 168 hours. BNP: Invalid input(s): POCBNP CBG: Recent Labs  Lab 04/02/21 0825 04/02/21 1220 04/02/21 1744 04/02/21 2053 04/03/21 0736  GLUCAP 330* 273* 102* 261* 269*   D-Dimer No results for input(s): DDIMER in the last 72 hours. Hgb A1c No results for input(s): HGBA1C in the last 72 hours. Lipid Profile No results for input(s): CHOL, HDL, LDLCALC, TRIG, CHOLHDL, LDLDIRECT in the last 72 hours. Thyroid function studies No results for input(s): TSH, T4TOTAL, T3FREE, THYROIDAB in the last 72 hours.  Invalid input(s): FREET3 Anemia work up No results for input(s): VITAMINB12, FOLATE, FERRITIN, TIBC, IRON, RETICCTPCT in the last 72 hours. Urinalysis    Component Value Date/Time   COLORURINE COLORLESS (A) 03/28/2021 1853   APPEARANCEUR CLEAR 03/28/2021 1853   LABSPEC 1.018 03/28/2021 1853   PHURINE 8.0 03/28/2021 1853   GLUCOSEU >=500 (A) 03/28/2021 1853   HGBUR MODERATE (A) 03/28/2021 1853   BILIRUBINUR NEGATIVE 03/28/2021 1853   KETONESUR 5 (A) 03/28/2021 1853   PROTEINUR NEGATIVE 03/28/2021 1853   NITRITE NEGATIVE  03/28/2021  1853   LEUKOCYTESUR NEGATIVE 03/28/2021 1853   Sepsis Labs Invalid input(s): PROCALCITONIN,  WBC,  LACTICIDVEN Microbiology Recent Results (from the past 240 hour(s))  SARS CORONAVIRUS 2 (TAT 6-24 HRS) Nasopharyngeal Nasopharyngeal Swab     Status: None   Collection Time: 03/28/21 12:10 PM   Specimen: Nasopharyngeal Swab  Result Value Ref Range Status   SARS Coronavirus 2 NEGATIVE NEGATIVE Final    Comment: (NOTE) SARS-CoV-2 target nucleic acids are NOT DETECTED.  The SARS-CoV-2 RNA is generally detectable in upper and lower respiratory specimens during the acute phase of infection. Negative results do not preclude SARS-CoV-2 infection, do not rule out co-infections with other pathogens, and should not be used as the sole basis for treatment or other patient management decisions. Negative results must be combined with clinical observations, patient history, and epidemiological information. The expected result is Negative.  Fact Sheet for Patients: HairSlick.no  Fact Sheet for Healthcare Providers: quierodirigir.com  This test is not yet approved or cleared by the [place] FDA and  has been authorized for detection and/or diagnosis of SARS-CoV-2 by FDA under an Emergency Use Authorization (EUA). This EUA will remain  in effect (meaning this test can be used) for the duration of the COVID-19 declaration under Se ction 564(b)(1) of the Act, 21 U.S.C. section 360bbb-3(b)(1), unless the authorization is terminated or revoked sooner.  Performed at [facility], [address], [place], [place] [address]   MRSA PCR Screening     Status: None   Collection Time: 03/29/21  3:54 PM   Specimen: Nasal Mucosa; Nasopharyngeal  Result Value Ref Range Status   MRSA by PCR NEGATIVE NEGATIVE Final    Comment:        The GeneXpert MRSA Assay (FDA approved for NASAL specimens only), is one component of  a comprehensive MRSA colonization surveillance program. It is not intended to diagnose MRSA infection nor to guide or monitor treatment for MRSA infections. Performed at [facility] [place] [facility], [address] [place], [place] [address]      Time coordinating discharge: 40 minutes  SIGNED:   Alba Cory, MD  [facility]

## 2021-04-03 NOTE — Care Management (Signed)
    Durable Medical Equipment  (From admission, onward)         Start     Ordered   04/02/21 0932  For home use only DME 3 n 1  Once        04/02/21 0932   04/02/21 0932  For home use only DME Tub bench  Once        04/02/21 0932   04/02/21 0932  For home use only DME Walker rolling  Once       Question Answer Comment  Walker: With 5 Inch Wheels   Patient needs a walker to treat with the following condition Closed fracture of right tibial plateau      04/02/21 0932   Unscheduled  For home use only DME lightweight manual wheelchair with seat cushion  Once       Comments: Patient suffers from leg fracture, weakness which impairs their ability to perform daily activities like walking, ADLs in the home.  A walker will not resolve  issue with performing activities of daily living. A lightweight wheelchair will allow patient to safely perform daily activities. Patient is not able to propel themselves in the home using a standard weight wheelchair due to weakness. Patient can self propel in the lightweight wheelchair. Length of need lifetime Accessories: elevating leg rests (ELRs), wheel locks, extensions and anti-tippers.   04/03/21 1019

## 2021-04-03 NOTE — Plan of Care (Signed)

## 2021-04-11 ENCOUNTER — Other Ambulatory Visit (HOSPITAL_COMMUNITY): Payer: Self-pay

## 2021-04-11 ENCOUNTER — Telehealth (HOSPITAL_COMMUNITY): Payer: Self-pay

## 2021-04-19 ENCOUNTER — Other Ambulatory Visit (HOSPITAL_COMMUNITY): Payer: Self-pay

## 2021-04-19 ENCOUNTER — Telehealth (HOSPITAL_COMMUNITY): Payer: Self-pay

## 2021-04-19 NOTE — Telephone Encounter (Signed)
Pharmacy Transitions of Care Follow-up Telephone Call  Date of discharge:04/03/21  Discharge Diagnosis: N/V, Ortho Surgery   How have you been since you were released from the hospital? Improving  Medication changes made at discharge:  - START: Enoxaparin for 28 days  - STOPPED:   - CHANGED:   Medication changes verified by the patient? Yes    Medication Accessibility:  Home Pharmacy:    Was the patient provided with refills on discharged medications? No  Have all prescriptions been transferred from [facility] to home pharmacy? No. No refills.  Is the patient able to afford medications? Yes . Notable copays:  . Eligible patient assistance:     Medication Review:   ENOXAPARIN - Please verify dosing and anticipated length of therapy. Read discharge note to confirm date of completion and verify this with the patient. Verified. Pt is not experiencing any ADRs.  Follow-up Appointments:  PCP Hospital f/u appt confirmed? Yes. Ortho appt was rescheduled to this week .  If their condition worsens, is the pt aware to call PCP or go to the Emergency Dept.? Yes  Final Patient Assessment: Pt states he is doing well and improving. He is planning to attend his Ortho Follow Up Appt. In 2 days. He is not experiencing any problems or bleeding from Enoxaparin and is aware he is to be using it for 28 days.

## 2024-04-20 ENCOUNTER — Other Ambulatory Visit: Payer: Self-pay

## 2024-05-11 ENCOUNTER — Other Ambulatory Visit: Payer: Self-pay

## 2024-05-12 ENCOUNTER — Other Ambulatory Visit: Payer: Self-pay
